# Patient Record
Sex: Female | Born: 1940 | Race: Black or African American | Hispanic: No | Smoking: Former smoker
Health system: Southern US, Community
[De-identification: ages and names within clinical notes are randomized; demographics above are authoritative.]

## PROBLEM LIST (undated history)

## (undated) DIAGNOSIS — I10 Essential (primary) hypertension: Secondary | ICD-10-CM

## (undated) DIAGNOSIS — G4733 Obstructive sleep apnea (adult) (pediatric): Secondary | ICD-10-CM

## (undated) DIAGNOSIS — K602 Anal fissure, unspecified: Secondary | ICD-10-CM

## (undated) DIAGNOSIS — K5909 Other constipation: Secondary | ICD-10-CM

## (undated) DIAGNOSIS — C50919 Malignant neoplasm of unspecified site of unspecified female breast: Secondary | ICD-10-CM

## (undated) DIAGNOSIS — M8580 Other specified disorders of bone density and structure, unspecified site: Secondary | ICD-10-CM

## (undated) DIAGNOSIS — J449 Chronic obstructive pulmonary disease, unspecified: Secondary | ICD-10-CM

## (undated) DIAGNOSIS — G629 Polyneuropathy, unspecified: Secondary | ICD-10-CM

## (undated) DIAGNOSIS — S53104A Unspecified dislocation of right ulnohumeral joint, initial encounter: Secondary | ICD-10-CM

## (undated) DIAGNOSIS — R339 Retention of urine, unspecified: Secondary | ICD-10-CM

## (undated) DIAGNOSIS — I729 Aneurysm of unspecified site: Secondary | ICD-10-CM

## (undated) DIAGNOSIS — K635 Polyp of colon: Secondary | ICD-10-CM

## (undated) DIAGNOSIS — E785 Hyperlipidemia, unspecified: Secondary | ICD-10-CM

## (undated) HISTORY — DX: Essential (primary) hypertension: I10

## (undated) HISTORY — DX: Malignant neoplasm of unspecified site of unspecified female breast: C50.919

## (undated) HISTORY — DX: Other specified disorders of bone density and structure, unspecified site: M85.80

## (undated) HISTORY — PX: EXPLORATORY LAPAROTOMY: SUR591

## (undated) HISTORY — DX: Retention of urine, unspecified: R33.9

## (undated) HISTORY — PX: OTHER SURGICAL HISTORY: SHX169

## (undated) HISTORY — DX: Chronic obstructive pulmonary disease, unspecified: J44.9

## (undated) HISTORY — DX: Anal fissure, unspecified: K60.2

## (undated) HISTORY — DX: Unspecified dislocation of right ulnohumeral joint, initial encounter: S53.104A

## (undated) HISTORY — DX: Polyp of colon: K63.5

## (undated) HISTORY — PX: APPENDECTOMY: SHX54

## (undated) HISTORY — DX: Obstructive sleep apnea (adult) (pediatric): G47.33

## (undated) HISTORY — DX: Aneurysm of unspecified site: I72.9

## (undated) HISTORY — DX: Hyperlipidemia, unspecified: E78.5

## (undated) HISTORY — DX: Other constipation: K59.09

## (undated) HISTORY — DX: Polyneuropathy, unspecified: G62.9

---

## 1998-03-27 ENCOUNTER — Inpatient Hospital Stay (HOSPITAL_COMMUNITY): Admission: EM | Admit: 1998-03-27 | Discharge: 1998-04-07 | Payer: Self-pay | Admitting: *Deleted

## 1998-06-09 ENCOUNTER — Other Ambulatory Visit: Admission: RE | Admit: 1998-06-09 | Discharge: 1998-06-09 | Payer: Self-pay | Admitting: Obstetrics and Gynecology

## 1998-06-19 ENCOUNTER — Emergency Department (HOSPITAL_COMMUNITY): Admission: EM | Admit: 1998-06-19 | Discharge: 1998-06-19 | Payer: Self-pay | Admitting: *Deleted

## 1998-10-31 ENCOUNTER — Ambulatory Visit (HOSPITAL_COMMUNITY): Admission: RE | Admit: 1998-10-31 | Discharge: 1998-10-31 | Payer: Self-pay | Admitting: Gastroenterology

## 1999-06-26 ENCOUNTER — Ambulatory Visit (HOSPITAL_COMMUNITY): Admission: RE | Admit: 1999-06-26 | Discharge: 1999-06-26 | Payer: Self-pay | Admitting: Gastroenterology

## 1999-06-26 ENCOUNTER — Encounter (INDEPENDENT_AMBULATORY_CARE_PROVIDER_SITE_OTHER): Payer: Self-pay | Admitting: Specialist

## 1999-10-24 ENCOUNTER — Other Ambulatory Visit: Admission: RE | Admit: 1999-10-24 | Discharge: 1999-10-24 | Payer: Self-pay | Admitting: Obstetrics and Gynecology

## 2000-11-04 ENCOUNTER — Other Ambulatory Visit: Admission: RE | Admit: 2000-11-04 | Discharge: 2000-11-04 | Payer: Self-pay | Admitting: Obstetrics and Gynecology

## 2000-12-18 ENCOUNTER — Ambulatory Visit: Admission: RE | Admit: 2000-12-18 | Discharge: 2000-12-18 | Payer: Self-pay | Admitting: Gynecology

## 2001-04-25 ENCOUNTER — Ambulatory Visit (HOSPITAL_COMMUNITY): Admission: RE | Admit: 2001-04-25 | Discharge: 2001-04-25 | Payer: Self-pay | Admitting: Gastroenterology

## 2001-04-25 ENCOUNTER — Encounter (INDEPENDENT_AMBULATORY_CARE_PROVIDER_SITE_OTHER): Payer: Self-pay | Admitting: *Deleted

## 2001-06-21 ENCOUNTER — Inpatient Hospital Stay (HOSPITAL_COMMUNITY): Admission: EM | Admit: 2001-06-21 | Discharge: 2001-07-01 | Payer: Self-pay | Admitting: Emergency Medicine

## 2001-06-21 ENCOUNTER — Encounter: Payer: Self-pay | Admitting: Internal Medicine

## 2001-07-08 ENCOUNTER — Encounter: Admission: RE | Admit: 2001-07-08 | Discharge: 2001-10-06 | Payer: Self-pay | Admitting: Pulmonary Disease

## 2002-02-10 ENCOUNTER — Other Ambulatory Visit: Admission: RE | Admit: 2002-02-10 | Discharge: 2002-02-10 | Payer: Self-pay | Admitting: Obstetrics and Gynecology

## 2002-06-22 ENCOUNTER — Ambulatory Visit (HOSPITAL_COMMUNITY): Admission: RE | Admit: 2002-06-22 | Discharge: 2002-06-22 | Payer: Self-pay | Admitting: Gastroenterology

## 2002-09-19 ENCOUNTER — Emergency Department (HOSPITAL_COMMUNITY): Admission: EM | Admit: 2002-09-19 | Discharge: 2002-09-20 | Payer: Self-pay | Admitting: Emergency Medicine

## 2002-09-20 ENCOUNTER — Encounter: Payer: Self-pay | Admitting: Emergency Medicine

## 2003-08-09 ENCOUNTER — Encounter: Payer: Self-pay | Admitting: Internal Medicine

## 2003-08-09 ENCOUNTER — Ambulatory Visit (HOSPITAL_BASED_OUTPATIENT_CLINIC_OR_DEPARTMENT_OTHER): Admission: RE | Admit: 2003-08-09 | Discharge: 2003-08-09 | Payer: Self-pay | Admitting: Pulmonary Disease

## 2003-11-16 ENCOUNTER — Other Ambulatory Visit: Admission: RE | Admit: 2003-11-16 | Discharge: 2003-11-16 | Payer: Self-pay | Admitting: Obstetrics and Gynecology

## 2003-11-19 ENCOUNTER — Ambulatory Visit (HOSPITAL_COMMUNITY): Admission: RE | Admit: 2003-11-19 | Discharge: 2003-11-19 | Payer: Self-pay | Admitting: Obstetrics and Gynecology

## 2004-03-06 ENCOUNTER — Emergency Department (HOSPITAL_COMMUNITY): Admission: EM | Admit: 2004-03-06 | Discharge: 2004-03-07 | Payer: Self-pay | Admitting: Emergency Medicine

## 2004-04-26 ENCOUNTER — Encounter: Admission: RE | Admit: 2004-04-26 | Discharge: 2004-04-26 | Payer: Self-pay | Admitting: General Surgery

## 2004-05-01 ENCOUNTER — Encounter (INDEPENDENT_AMBULATORY_CARE_PROVIDER_SITE_OTHER): Payer: Self-pay | Admitting: *Deleted

## 2004-05-01 ENCOUNTER — Ambulatory Visit (HOSPITAL_COMMUNITY): Admission: RE | Admit: 2004-05-01 | Discharge: 2004-05-01 | Payer: Self-pay | Admitting: General Surgery

## 2004-05-01 ENCOUNTER — Ambulatory Visit (HOSPITAL_BASED_OUTPATIENT_CLINIC_OR_DEPARTMENT_OTHER): Admission: RE | Admit: 2004-05-01 | Discharge: 2004-05-01 | Payer: Self-pay | Admitting: General Surgery

## 2004-08-07 ENCOUNTER — Inpatient Hospital Stay (HOSPITAL_COMMUNITY): Admission: EM | Admit: 2004-08-07 | Discharge: 2004-08-18 | Payer: Self-pay | Admitting: Emergency Medicine

## 2004-08-07 ENCOUNTER — Encounter (INDEPENDENT_AMBULATORY_CARE_PROVIDER_SITE_OTHER): Payer: Self-pay | Admitting: *Deleted

## 2004-12-10 HISTORY — PX: MASTECTOMY: SHX3

## 2004-12-10 LAB — HM COLONOSCOPY: HM Colonoscopy: NORMAL

## 2005-01-01 ENCOUNTER — Encounter: Admission: RE | Admit: 2005-01-01 | Discharge: 2005-01-01 | Payer: Self-pay | Admitting: General Surgery

## 2005-01-10 ENCOUNTER — Encounter: Admission: RE | Admit: 2005-01-10 | Discharge: 2005-01-10 | Payer: Self-pay | Admitting: Surgery

## 2005-01-23 ENCOUNTER — Ambulatory Visit: Payer: Self-pay | Admitting: Internal Medicine

## 2005-01-24 ENCOUNTER — Ambulatory Visit: Payer: Self-pay | Admitting: Internal Medicine

## 2005-01-25 ENCOUNTER — Other Ambulatory Visit: Admission: RE | Admit: 2005-01-25 | Discharge: 2005-01-25 | Payer: Self-pay | Admitting: Obstetrics and Gynecology

## 2005-01-30 ENCOUNTER — Inpatient Hospital Stay (HOSPITAL_COMMUNITY): Admission: RE | Admit: 2005-01-30 | Discharge: 2005-02-02 | Payer: Self-pay | Admitting: General Surgery

## 2005-01-30 ENCOUNTER — Encounter (INDEPENDENT_AMBULATORY_CARE_PROVIDER_SITE_OTHER): Payer: Self-pay | Admitting: *Deleted

## 2005-02-12 ENCOUNTER — Ambulatory Visit: Payer: Self-pay | Admitting: Oncology

## 2005-02-23 ENCOUNTER — Ambulatory Visit: Admission: RE | Admit: 2005-02-23 | Discharge: 2005-04-25 | Payer: Self-pay | Admitting: Radiation Oncology

## 2005-03-30 ENCOUNTER — Ambulatory Visit: Payer: Self-pay | Admitting: Oncology

## 2005-07-27 ENCOUNTER — Ambulatory Visit: Payer: Self-pay | Admitting: Oncology

## 2005-07-27 ENCOUNTER — Ambulatory Visit: Payer: Self-pay | Admitting: Internal Medicine

## 2005-07-30 ENCOUNTER — Encounter: Payer: Self-pay | Admitting: Internal Medicine

## 2005-08-28 ENCOUNTER — Encounter: Payer: Self-pay | Admitting: Internal Medicine

## 2005-08-29 ENCOUNTER — Encounter: Payer: Self-pay | Admitting: Internal Medicine

## 2005-08-29 ENCOUNTER — Encounter: Admission: RE | Admit: 2005-08-29 | Discharge: 2005-08-29 | Payer: Self-pay | Admitting: Gastroenterology

## 2006-01-23 ENCOUNTER — Ambulatory Visit: Payer: Self-pay | Admitting: Oncology

## 2006-01-28 ENCOUNTER — Other Ambulatory Visit: Admission: RE | Admit: 2006-01-28 | Discharge: 2006-01-28 | Payer: Self-pay | Admitting: Obstetrics and Gynecology

## 2006-02-25 ENCOUNTER — Encounter: Payer: Self-pay | Admitting: Internal Medicine

## 2006-03-23 ENCOUNTER — Emergency Department (HOSPITAL_COMMUNITY): Admission: EM | Admit: 2006-03-23 | Discharge: 2006-03-23 | Payer: Self-pay | Admitting: Emergency Medicine

## 2006-08-16 ENCOUNTER — Ambulatory Visit: Payer: Self-pay | Admitting: Oncology

## 2006-08-19 LAB — CBC WITH DIFFERENTIAL/PLATELET
BASO%: 0.4 % (ref 0.0–2.0)
Eosinophils Absolute: 0.4 10*3/uL (ref 0.0–0.5)
MONO#: 0.6 10*3/uL (ref 0.1–0.9)
MONO%: 4.9 % (ref 0.0–13.0)
NEUT#: 9 10*3/uL — ABNORMAL HIGH (ref 1.5–6.5)
RBC: 4.33 10*6/uL (ref 3.70–5.32)
RDW: 13.8 % (ref 11.3–14.5)
WBC: 11.8 10*3/uL — ABNORMAL HIGH (ref 3.9–10.0)

## 2006-08-19 LAB — COMPREHENSIVE METABOLIC PANEL
ALT: 11 U/L (ref 0–40)
AST: 15 U/L (ref 0–37)
CO2: 25 mEq/L (ref 19–32)
Calcium: 9.6 mg/dL (ref 8.4–10.5)
Chloride: 104 mEq/L (ref 96–112)
Potassium: 3.8 mEq/L (ref 3.5–5.3)
Sodium: 139 mEq/L (ref 135–145)
Total Protein: 7.3 g/dL (ref 6.0–8.3)

## 2006-08-19 LAB — CANCER ANTIGEN 27.29: CA 27.29: 25 U/mL (ref 0–39)

## 2006-10-02 ENCOUNTER — Ambulatory Visit: Payer: Self-pay | Admitting: Internal Medicine

## 2006-10-02 LAB — CONVERTED CEMR LAB
AST: 21 units/L (ref 0–37)
BUN: 10 mg/dL (ref 6–23)
CO2: 29 meq/L (ref 19–32)
Glomerular Filtration Rate, Af Am: 108 mL/min/{1.73_m2}
HCT: 42.5 % (ref 36.0–46.0)
MCHC: 32.6 g/dL (ref 30.0–36.0)
Microalb, Ur: 0.8 mg/dL (ref 0.0–1.9)
Platelets: 333 10*3/uL (ref 150–400)
Potassium: 3.8 meq/L (ref 3.5–5.1)
RDW: 13 % (ref 11.5–14.6)
Sed Rate: 20 mm/hr (ref 0–25)
Total CK: 121 units/L (ref 7–177)

## 2006-10-23 ENCOUNTER — Encounter: Payer: Self-pay | Admitting: Internal Medicine

## 2006-10-23 ENCOUNTER — Ambulatory Visit: Payer: Self-pay

## 2006-12-06 ENCOUNTER — Ambulatory Visit: Payer: Self-pay | Admitting: Internal Medicine

## 2007-02-12 ENCOUNTER — Ambulatory Visit: Payer: Self-pay | Admitting: Oncology

## 2007-02-19 LAB — CBC WITH DIFFERENTIAL/PLATELET
Basophils Absolute: 0.1 10*3/uL (ref 0.0–0.1)
EOS%: 5.8 % (ref 0.0–7.0)
LYMPH%: 18.8 % (ref 14.0–48.0)
MCH: 30.5 pg (ref 26.0–34.0)
MCV: 88.1 fL (ref 81.0–101.0)
MONO%: 6.8 % (ref 0.0–13.0)
Platelets: 321 10*3/uL (ref 145–400)
RBC: 4.37 10*6/uL (ref 3.70–5.32)
RDW: 13.5 % (ref 11.3–14.5)

## 2007-02-19 LAB — COMPREHENSIVE METABOLIC PANEL
AST: 16 U/L (ref 0–37)
Albumin: 4.4 g/dL (ref 3.5–5.2)
Alkaline Phosphatase: 66 U/L (ref 39–117)
BUN: 15 mg/dL (ref 6–23)
Glucose, Bld: 96 mg/dL (ref 70–99)
Potassium: 3.9 mEq/L (ref 3.5–5.3)
Sodium: 142 mEq/L (ref 135–145)
Total Bilirubin: 0.5 mg/dL (ref 0.3–1.2)

## 2007-02-21 ENCOUNTER — Ambulatory Visit: Payer: Self-pay | Admitting: Internal Medicine

## 2007-05-16 DIAGNOSIS — E669 Obesity, unspecified: Secondary | ICD-10-CM

## 2007-05-16 DIAGNOSIS — M8580 Other specified disorders of bone density and structure, unspecified site: Secondary | ICD-10-CM

## 2007-05-16 DIAGNOSIS — Z853 Personal history of malignant neoplasm of breast: Secondary | ICD-10-CM

## 2007-05-16 DIAGNOSIS — Z860100 Personal history of colon polyps, unspecified: Secondary | ICD-10-CM | POA: Insufficient documentation

## 2007-05-16 DIAGNOSIS — E1140 Type 2 diabetes mellitus with diabetic neuropathy, unspecified: Secondary | ICD-10-CM

## 2007-05-16 DIAGNOSIS — J302 Other seasonal allergic rhinitis: Secondary | ICD-10-CM

## 2007-05-16 DIAGNOSIS — G4733 Obstructive sleep apnea (adult) (pediatric): Secondary | ICD-10-CM

## 2007-05-16 DIAGNOSIS — J4541 Moderate persistent asthma with (acute) exacerbation: Secondary | ICD-10-CM

## 2007-07-29 ENCOUNTER — Telehealth (INDEPENDENT_AMBULATORY_CARE_PROVIDER_SITE_OTHER): Payer: Self-pay | Admitting: *Deleted

## 2007-08-04 ENCOUNTER — Ambulatory Visit: Payer: Self-pay | Admitting: Internal Medicine

## 2007-08-04 DIAGNOSIS — E119 Type 2 diabetes mellitus without complications: Secondary | ICD-10-CM

## 2007-08-06 LAB — CONVERTED CEMR LAB
BUN: 10 mg/dL (ref 6–23)
Creatinine, Ser: 0.5 mg/dL (ref 0.4–1.2)
GFR calc Af Amer: 160 mL/min
GFR calc non Af Amer: 132 mL/min
Hgb A1c MFr Bld: 7.2 % — ABNORMAL HIGH (ref 4.6–6.0)
Potassium: 3.7 meq/L (ref 3.5–5.1)
Theophylline Lvl: 8.1 ug/mL — ABNORMAL LOW (ref 10.0–20.0)

## 2007-08-15 ENCOUNTER — Ambulatory Visit: Payer: Self-pay | Admitting: Oncology

## 2007-08-21 ENCOUNTER — Telehealth: Payer: Self-pay | Admitting: Internal Medicine

## 2007-08-25 LAB — CBC WITH DIFFERENTIAL/PLATELET
Eosinophils Absolute: 0.5 10*3/uL (ref 0.0–0.5)
LYMPH%: 16.7 % (ref 14.0–48.0)
MONO#: 0.9 10*3/uL (ref 0.1–0.9)
NEUT#: 9.7 10*3/uL — ABNORMAL HIGH (ref 1.5–6.5)
Platelets: 299 10*3/uL (ref 145–400)
RBC: 4.37 10*6/uL (ref 3.70–5.32)
WBC: 13.5 10*3/uL — ABNORMAL HIGH (ref 3.9–10.0)

## 2007-08-26 LAB — COMPREHENSIVE METABOLIC PANEL
Albumin: 4.6 g/dL (ref 3.5–5.2)
CO2: 21 mEq/L (ref 19–32)
Calcium: 10.2 mg/dL (ref 8.4–10.5)
Chloride: 103 mEq/L (ref 96–112)
Glucose, Bld: 113 mg/dL — ABNORMAL HIGH (ref 70–99)
Potassium: 4.1 mEq/L (ref 3.5–5.3)
Sodium: 141 mEq/L (ref 135–145)
Total Bilirubin: 0.4 mg/dL (ref 0.3–1.2)
Total Protein: 7.6 g/dL (ref 6.0–8.3)

## 2007-08-26 LAB — CANCER ANTIGEN 27.29: CA 27.29: 36 U/mL (ref 0–39)

## 2007-08-26 LAB — LACTATE DEHYDROGENASE: LDH: 167 U/L (ref 94–250)

## 2007-09-01 ENCOUNTER — Ambulatory Visit: Payer: Self-pay | Admitting: Internal Medicine

## 2007-10-11 LAB — CONVERTED CEMR LAB: Pap Smear: NORMAL

## 2007-11-04 ENCOUNTER — Ambulatory Visit: Payer: Self-pay | Admitting: Internal Medicine

## 2007-11-04 DIAGNOSIS — E785 Hyperlipidemia, unspecified: Secondary | ICD-10-CM | POA: Insufficient documentation

## 2007-11-05 LAB — CONVERTED CEMR LAB
ALT: 23 units/L (ref 0–35)
AST: 24 units/L (ref 0–37)
BUN: 10 mg/dL (ref 6–23)
Basophils Absolute: 0.1 10*3/uL (ref 0.0–0.1)
Basophils Relative: 0.8 % (ref 0.0–1.0)
CO2: 26 meq/L (ref 19–32)
Calcium: 10.2 mg/dL (ref 8.4–10.5)
Creatinine, Ser: 0.6 mg/dL (ref 0.4–1.2)
Hgb A1c MFr Bld: 7.4 % — ABNORMAL HIGH (ref 4.6–6.0)
Monocytes Relative: 7 % (ref 3.0–11.0)
Platelets: 327 10*3/uL (ref 150–400)
RBC: 4.43 M/uL (ref 3.87–5.11)
RDW: 13.1 % (ref 11.5–14.6)
TSH: 0.73 microintl units/mL (ref 0.35–5.50)

## 2007-12-22 ENCOUNTER — Ambulatory Visit: Payer: Self-pay | Admitting: Internal Medicine

## 2007-12-23 ENCOUNTER — Ambulatory Visit: Payer: Self-pay | Admitting: Internal Medicine

## 2007-12-24 ENCOUNTER — Telehealth (INDEPENDENT_AMBULATORY_CARE_PROVIDER_SITE_OTHER): Payer: Self-pay | Admitting: *Deleted

## 2008-01-06 ENCOUNTER — Ambulatory Visit: Payer: Self-pay | Admitting: Internal Medicine

## 2008-01-06 ENCOUNTER — Telehealth: Payer: Self-pay | Admitting: Internal Medicine

## 2008-01-27 ENCOUNTER — Ambulatory Visit: Payer: Self-pay | Admitting: Internal Medicine

## 2008-02-18 ENCOUNTER — Ambulatory Visit: Payer: Self-pay | Admitting: Oncology

## 2008-02-20 LAB — CBC WITH DIFFERENTIAL/PLATELET
Basophils Absolute: 0.1 10*3/uL (ref 0.0–0.1)
EOS%: 5.2 % (ref 0.0–7.0)
HCT: 38.7 % (ref 34.8–46.6)
HGB: 13.1 g/dL (ref 11.6–15.9)
MCH: 29.2 pg (ref 26.0–34.0)
MONO#: 0.6 10*3/uL (ref 0.1–0.9)
NEUT%: 66.5 % (ref 39.6–76.8)
lymph#: 2.7 10*3/uL (ref 0.9–3.3)

## 2008-02-20 LAB — COMPREHENSIVE METABOLIC PANEL
BUN: 13 mg/dL (ref 6–23)
CO2: 23 mEq/L (ref 19–32)
Calcium: 9 mg/dL (ref 8.4–10.5)
Chloride: 104 mEq/L (ref 96–112)
Creatinine, Ser: 0.65 mg/dL (ref 0.40–1.20)
Glucose, Bld: 132 mg/dL — ABNORMAL HIGH (ref 70–99)

## 2008-02-20 LAB — LACTATE DEHYDROGENASE: LDH: 156 U/L (ref 94–250)

## 2008-02-27 ENCOUNTER — Encounter: Payer: Self-pay | Admitting: Internal Medicine

## 2008-03-23 ENCOUNTER — Ambulatory Visit: Payer: Self-pay | Admitting: Internal Medicine

## 2008-08-26 ENCOUNTER — Telehealth (INDEPENDENT_AMBULATORY_CARE_PROVIDER_SITE_OTHER): Payer: Self-pay | Admitting: *Deleted

## 2008-10-08 ENCOUNTER — Ambulatory Visit: Payer: Self-pay | Admitting: Internal Medicine

## 2008-10-12 ENCOUNTER — Encounter (INDEPENDENT_AMBULATORY_CARE_PROVIDER_SITE_OTHER): Payer: Self-pay | Admitting: *Deleted

## 2008-10-12 LAB — CONVERTED CEMR LAB
ALT: 12 units/L (ref 0–35)
AST: 18 units/L (ref 0–37)
BUN: 9 mg/dL (ref 6–23)
Basophils Relative: 0.3 % (ref 0.0–3.0)
CO2: 27 meq/L (ref 19–32)
Chloride: 106 meq/L (ref 96–112)
Creatinine, Ser: 0.6 mg/dL (ref 0.4–1.2)
Eosinophils Absolute: 0.6 10*3/uL (ref 0.0–0.7)
Eosinophils Relative: 5.4 % — ABNORMAL HIGH (ref 0.0–5.0)
GFR calc non Af Amer: 106 mL/min
Glucose, Bld: 141 mg/dL — ABNORMAL HIGH (ref 70–99)
Hgb A1c MFr Bld: 7.2 % — ABNORMAL HIGH (ref 4.6–6.0)
MCV: 89.3 fL (ref 78.0–100.0)
Microalb Creat Ratio: 33.1 mg/g — ABNORMAL HIGH (ref 0.0–30.0)
Monocytes Relative: 3.9 % (ref 3.0–12.0)
Neutrophils Relative %: 74.5 % (ref 43.0–77.0)
Platelets: 304 10*3/uL (ref 150–400)
Potassium: 4 meq/L (ref 3.5–5.1)
RBC: 4.51 M/uL (ref 3.87–5.11)
TSH: 0.71 microintl units/mL (ref 0.35–5.50)
Total CHOL/HDL Ratio: 3.4
VLDL: 19 mg/dL (ref 0–40)
WBC: 11.1 10*3/uL — ABNORMAL HIGH (ref 4.5–10.5)

## 2008-10-25 ENCOUNTER — Telehealth (INDEPENDENT_AMBULATORY_CARE_PROVIDER_SITE_OTHER): Payer: Self-pay | Admitting: *Deleted

## 2008-11-17 ENCOUNTER — Ambulatory Visit: Payer: Self-pay | Admitting: Internal Medicine

## 2008-11-22 ENCOUNTER — Ambulatory Visit: Payer: Self-pay | Admitting: Internal Medicine

## 2008-11-24 ENCOUNTER — Telehealth (INDEPENDENT_AMBULATORY_CARE_PROVIDER_SITE_OTHER): Payer: Self-pay | Admitting: *Deleted

## 2009-02-14 ENCOUNTER — Ambulatory Visit: Payer: Self-pay | Admitting: Internal Medicine

## 2009-02-16 LAB — CONVERTED CEMR LAB: Hgb A1c MFr Bld: 7 % — ABNORMAL HIGH (ref 4.6–6.0)

## 2009-03-07 ENCOUNTER — Telehealth (INDEPENDENT_AMBULATORY_CARE_PROVIDER_SITE_OTHER): Payer: Self-pay | Admitting: *Deleted

## 2009-03-08 ENCOUNTER — Telehealth (INDEPENDENT_AMBULATORY_CARE_PROVIDER_SITE_OTHER): Payer: Self-pay | Admitting: *Deleted

## 2009-03-28 ENCOUNTER — Telehealth (INDEPENDENT_AMBULATORY_CARE_PROVIDER_SITE_OTHER): Payer: Self-pay | Admitting: *Deleted

## 2009-04-19 ENCOUNTER — Encounter: Payer: Self-pay | Admitting: Internal Medicine

## 2009-04-20 ENCOUNTER — Encounter: Payer: Self-pay | Admitting: Internal Medicine

## 2009-05-18 ENCOUNTER — Encounter: Payer: Self-pay | Admitting: Internal Medicine

## 2009-06-09 ENCOUNTER — Ambulatory Visit: Payer: Self-pay | Admitting: Internal Medicine

## 2009-06-09 DIAGNOSIS — R1032 Left lower quadrant pain: Secondary | ICD-10-CM

## 2009-06-09 DIAGNOSIS — M79609 Pain in unspecified limb: Secondary | ICD-10-CM | POA: Insufficient documentation

## 2009-06-10 ENCOUNTER — Encounter: Admission: RE | Admit: 2009-06-10 | Discharge: 2009-06-10 | Payer: Self-pay | Admitting: Internal Medicine

## 2009-06-14 LAB — CONVERTED CEMR LAB
AST: 24 units/L (ref 0–37)
BUN: 11 mg/dL (ref 6–23)
Basophils Absolute: 0.1 10*3/uL (ref 0.0–0.1)
CO2: 28 meq/L (ref 19–32)
Calcium: 9.8 mg/dL (ref 8.4–10.5)
Eosinophils Absolute: 1 10*3/uL — ABNORMAL HIGH (ref 0.0–0.7)
GFR calc non Af Amer: 128.33 mL/min (ref >60)
Glucose, Bld: 67 mg/dL — ABNORMAL LOW (ref 70–99)
Hemoglobin: 13.8 g/dL (ref 12.0–15.0)
Lymphocytes Relative: 22.7 % (ref 12.0–46.0)
Lymphs Abs: 2.5 10*3/uL (ref 0.7–4.0)
MCHC: 33.4 g/dL (ref 30.0–36.0)
Monocytes Relative: 2 % — ABNORMAL LOW (ref 3.0–12.0)
Neutro Abs: 7.4 10*3/uL (ref 1.4–7.7)
Platelets: 312 10*3/uL (ref 150.0–400.0)
RDW: 13.2 % (ref 11.5–14.6)
Sodium: 143 meq/L (ref 135–145)

## 2009-07-05 ENCOUNTER — Encounter: Payer: Self-pay | Admitting: Internal Medicine

## 2009-07-12 ENCOUNTER — Ambulatory Visit: Payer: Self-pay | Admitting: Internal Medicine

## 2009-07-13 ENCOUNTER — Encounter (INDEPENDENT_AMBULATORY_CARE_PROVIDER_SITE_OTHER): Payer: Self-pay | Admitting: *Deleted

## 2009-08-09 ENCOUNTER — Ambulatory Visit: Payer: Self-pay | Admitting: Internal Medicine

## 2009-08-18 ENCOUNTER — Telehealth (INDEPENDENT_AMBULATORY_CARE_PROVIDER_SITE_OTHER): Payer: Self-pay | Admitting: *Deleted

## 2009-09-01 ENCOUNTER — Encounter: Payer: Self-pay | Admitting: Internal Medicine

## 2009-09-08 ENCOUNTER — Ambulatory Visit: Payer: Self-pay | Admitting: Internal Medicine

## 2009-09-19 ENCOUNTER — Encounter: Payer: Self-pay | Admitting: Internal Medicine

## 2010-01-13 ENCOUNTER — Telehealth (INDEPENDENT_AMBULATORY_CARE_PROVIDER_SITE_OTHER): Payer: Self-pay | Admitting: *Deleted

## 2010-01-20 ENCOUNTER — Ambulatory Visit: Payer: Self-pay | Admitting: Internal Medicine

## 2010-01-23 LAB — CONVERTED CEMR LAB
ALT: 17 U/L
AST: 17 U/L
BUN: 7 mg/dL
Basophils Absolute: 0.1 K/uL
Basophils Relative: 0.6 %
CO2: 28 meq/L
Calcium: 10.3 mg/dL
Chloride: 102 meq/L
Cholesterol: 164 mg/dL
Creatinine, Ser: 0.6 mg/dL
Creatinine,U: 41.3 mg/dL
Eosinophils Absolute: 0.4 K/uL
Eosinophils Relative: 4.3 %
GFR calc non Af Amer: 128.09 mL/min
Glucose, Bld: 93 mg/dL
HCT: 41.7 %
HDL: 62.3 mg/dL
Hemoglobin: 13.6 g/dL
Hgb A1c MFr Bld: 6.9 % — ABNORMAL HIGH
LDL Cholesterol: 86 mg/dL
Lymphocytes Relative: 25.5 %
Lymphs Abs: 2.4 K/uL
MCHC: 32.6 g/dL
MCV: 94.1 fL
Microalb Creat Ratio: 16.9 mg/g
Microalb, Ur: 0.7 mg/dL
Monocytes Absolute: 0.5 K/uL
Monocytes Relative: 5.2 %
Neutro Abs: 6 K/uL
Neutrophils Relative %: 64.4 %
Platelets: 292 K/uL
Potassium: 3.6 meq/L
RBC: 4.43 M/uL
RDW: 12.6 %
Sodium: 139 meq/L
TSH: 0.84 u[IU]/mL
Total CHOL/HDL Ratio: 3
Triglycerides: 79 mg/dL
VLDL: 15.8 mg/dL
WBC: 9.4 10*3/microliter

## 2010-01-25 ENCOUNTER — Telehealth: Payer: Self-pay | Admitting: Internal Medicine

## 2010-02-08 ENCOUNTER — Ambulatory Visit: Payer: Self-pay | Admitting: Internal Medicine

## 2010-02-09 ENCOUNTER — Ambulatory Visit: Payer: Self-pay | Admitting: Internal Medicine

## 2010-02-20 ENCOUNTER — Telehealth: Payer: Self-pay | Admitting: Internal Medicine

## 2010-03-03 ENCOUNTER — Encounter: Payer: Self-pay | Admitting: Internal Medicine

## 2010-03-15 ENCOUNTER — Telehealth: Payer: Self-pay | Admitting: Internal Medicine

## 2010-03-17 ENCOUNTER — Encounter: Admission: RE | Admit: 2010-03-17 | Discharge: 2010-03-17 | Payer: Self-pay | Admitting: Gastroenterology

## 2010-03-20 ENCOUNTER — Encounter: Payer: Self-pay | Admitting: Internal Medicine

## 2010-03-24 ENCOUNTER — Encounter: Payer: Self-pay | Admitting: Internal Medicine

## 2010-03-28 ENCOUNTER — Encounter: Payer: Self-pay | Admitting: Internal Medicine

## 2010-03-29 ENCOUNTER — Encounter: Payer: Self-pay | Admitting: Internal Medicine

## 2010-03-30 ENCOUNTER — Encounter: Payer: Self-pay | Admitting: Internal Medicine

## 2010-03-30 ENCOUNTER — Ambulatory Visit: Payer: Self-pay | Admitting: Oncology

## 2010-03-31 LAB — CBC WITH DIFFERENTIAL/PLATELET
BASO%: 0.3 % (ref 0.0–2.0)
Eosinophils Absolute: 0.4 10*3/uL (ref 0.0–0.5)
HCT: 42.6 % (ref 34.8–46.6)
LYMPH%: 20.9 % (ref 14.0–49.7)
MCHC: 33.4 g/dL (ref 31.5–36.0)
MCV: 92.7 fL (ref 79.5–101.0)
MONO#: 0.5 10*3/uL (ref 0.1–0.9)
MONO%: 5.3 % (ref 0.0–14.0)
NEUT%: 69.9 % (ref 38.4–76.8)
Platelets: 312 10*3/uL (ref 145–400)
WBC: 10.1 10*3/uL (ref 3.9–10.3)

## 2010-03-31 LAB — COMPREHENSIVE METABOLIC PANEL
Alkaline Phosphatase: 54 U/L (ref 39–117)
CO2: 27 mEq/L (ref 19–32)
Creatinine, Ser: 0.72 mg/dL (ref 0.40–1.20)
Glucose, Bld: 170 mg/dL — ABNORMAL HIGH (ref 70–99)
Total Bilirubin: 0.5 mg/dL (ref 0.3–1.2)

## 2010-03-31 LAB — LACTATE DEHYDROGENASE: LDH: 149 U/L (ref 94–250)

## 2010-03-31 LAB — CANCER ANTIGEN 27.29: CA 27.29: 28 U/mL (ref 0–39)

## 2010-04-04 ENCOUNTER — Encounter: Payer: Self-pay | Admitting: Internal Medicine

## 2010-04-07 ENCOUNTER — Ambulatory Visit: Payer: Self-pay | Admitting: Family Medicine

## 2010-04-07 DIAGNOSIS — M25519 Pain in unspecified shoulder: Secondary | ICD-10-CM

## 2010-04-10 ENCOUNTER — Ambulatory Visit: Payer: Self-pay | Admitting: Internal Medicine

## 2010-04-11 ENCOUNTER — Ambulatory Visit (HOSPITAL_COMMUNITY): Admission: RE | Admit: 2010-04-11 | Discharge: 2010-04-11 | Payer: Self-pay | Admitting: General Surgery

## 2010-04-12 ENCOUNTER — Telehealth (INDEPENDENT_AMBULATORY_CARE_PROVIDER_SITE_OTHER): Payer: Self-pay | Admitting: *Deleted

## 2010-04-12 ENCOUNTER — Ambulatory Visit (HOSPITAL_COMMUNITY): Admission: RE | Admit: 2010-04-12 | Discharge: 2010-04-12 | Payer: Self-pay | Admitting: Oncology

## 2010-04-14 ENCOUNTER — Telehealth (INDEPENDENT_AMBULATORY_CARE_PROVIDER_SITE_OTHER): Payer: Self-pay | Admitting: *Deleted

## 2010-04-14 ENCOUNTER — Ambulatory Visit: Admission: RE | Admit: 2010-04-14 | Discharge: 2010-06-08 | Payer: Self-pay | Admitting: Radiation Oncology

## 2010-04-17 ENCOUNTER — Encounter: Payer: Self-pay | Admitting: Internal Medicine

## 2010-04-18 ENCOUNTER — Ambulatory Visit (HOSPITAL_BASED_OUTPATIENT_CLINIC_OR_DEPARTMENT_OTHER): Admission: RE | Admit: 2010-04-18 | Discharge: 2010-04-18 | Payer: Self-pay | Admitting: General Surgery

## 2010-04-27 ENCOUNTER — Encounter: Payer: Self-pay | Admitting: Internal Medicine

## 2010-05-23 ENCOUNTER — Ambulatory Visit: Payer: Self-pay | Admitting: Oncology

## 2010-05-24 ENCOUNTER — Encounter: Payer: Self-pay | Admitting: Internal Medicine

## 2010-05-30 ENCOUNTER — Encounter: Payer: Self-pay | Admitting: Internal Medicine

## 2010-06-01 ENCOUNTER — Encounter: Payer: Self-pay | Admitting: Internal Medicine

## 2010-06-05 ENCOUNTER — Telehealth: Payer: Self-pay | Admitting: Internal Medicine

## 2010-06-05 ENCOUNTER — Encounter: Payer: Self-pay | Admitting: Internal Medicine

## 2010-06-09 ENCOUNTER — Ambulatory Visit: Admission: RE | Admit: 2010-06-09 | Discharge: 2010-07-14 | Payer: Self-pay | Admitting: Radiation Oncology

## 2010-06-15 ENCOUNTER — Ambulatory Visit: Payer: Self-pay | Admitting: Internal Medicine

## 2010-07-06 ENCOUNTER — Encounter: Payer: Self-pay | Admitting: Internal Medicine

## 2010-07-11 ENCOUNTER — Encounter: Payer: Self-pay | Admitting: Internal Medicine

## 2010-07-16 ENCOUNTER — Encounter: Payer: Self-pay | Admitting: Internal Medicine

## 2010-07-26 ENCOUNTER — Encounter: Payer: Self-pay | Admitting: Internal Medicine

## 2010-08-01 ENCOUNTER — Telehealth (INDEPENDENT_AMBULATORY_CARE_PROVIDER_SITE_OTHER): Payer: Self-pay | Admitting: *Deleted

## 2010-08-04 ENCOUNTER — Ambulatory Visit: Payer: Self-pay | Admitting: Internal Medicine

## 2010-08-07 LAB — CONVERTED CEMR LAB: Hgb A1c MFr Bld: 6.9 % — ABNORMAL HIGH (ref 4.6–6.5)

## 2010-08-16 ENCOUNTER — Ambulatory Visit: Payer: Self-pay | Admitting: Oncology

## 2010-08-16 LAB — CBC WITH DIFFERENTIAL/PLATELET
BASO%: 0.4 % (ref 0.0–2.0)
EOS%: 4.1 % (ref 0.0–7.0)
Eosinophils Absolute: 0.3 10*3/uL (ref 0.0–0.5)
LYMPH%: 8.9 % — ABNORMAL LOW (ref 14.0–49.7)
MCH: 30.3 pg (ref 25.1–34.0)
MCHC: 33.1 g/dL (ref 31.5–36.0)
MCV: 91.5 fL (ref 79.5–101.0)
MONO%: 6.4 % (ref 0.0–14.0)
Platelets: 274 10*3/uL (ref 145–400)
RBC: 4.3 10*6/uL (ref 3.70–5.45)
RDW: 14.1 % (ref 11.2–14.5)

## 2010-08-16 LAB — COMPREHENSIVE METABOLIC PANEL
AST: 21 U/L (ref 0–37)
Alkaline Phosphatase: 58 U/L (ref 39–117)
Glucose, Bld: 119 mg/dL — ABNORMAL HIGH (ref 70–99)
Potassium: 4 mEq/L (ref 3.5–5.3)
Sodium: 141 mEq/L (ref 135–145)
Total Bilirubin: 0.5 mg/dL (ref 0.3–1.2)
Total Protein: 7.1 g/dL (ref 6.0–8.3)

## 2010-08-17 ENCOUNTER — Ambulatory Visit: Payer: Self-pay | Admitting: Internal Medicine

## 2010-08-24 ENCOUNTER — Encounter: Payer: Self-pay | Admitting: Internal Medicine

## 2010-08-24 ENCOUNTER — Telehealth: Payer: Self-pay | Admitting: Internal Medicine

## 2010-10-17 ENCOUNTER — Ambulatory Visit: Payer: Self-pay | Admitting: Internal Medicine

## 2010-10-17 DIAGNOSIS — B009 Herpesviral infection, unspecified: Secondary | ICD-10-CM | POA: Insufficient documentation

## 2010-10-25 ENCOUNTER — Ambulatory Visit: Payer: Self-pay | Admitting: Internal Medicine

## 2010-11-21 ENCOUNTER — Ambulatory Visit: Payer: Self-pay | Admitting: Oncology

## 2010-11-22 ENCOUNTER — Ambulatory Visit: Payer: Self-pay | Admitting: Internal Medicine

## 2010-11-23 LAB — CBC WITH DIFFERENTIAL/PLATELET
Eosinophils Absolute: 0.2 10*3/uL (ref 0.0–0.5)
MONO#: 0.5 10*3/uL (ref 0.1–0.9)
NEUT#: 6.8 10*3/uL — ABNORMAL HIGH (ref 1.5–6.5)
RBC: 4.46 10*6/uL (ref 3.70–5.45)
RDW: 14 % (ref 11.2–14.5)
WBC: 8.4 10*3/uL (ref 3.9–10.3)
lymph#: 0.8 10*3/uL — ABNORMAL LOW (ref 0.9–3.3)

## 2010-11-23 LAB — COMPREHENSIVE METABOLIC PANEL
Albumin: 4.1 g/dL (ref 3.5–5.2)
Alkaline Phosphatase: 57 U/L (ref 39–117)
CO2: 28 mEq/L (ref 19–32)
Chloride: 103 mEq/L (ref 96–112)
Glucose, Bld: 194 mg/dL — ABNORMAL HIGH (ref 70–99)
Potassium: 3.6 mEq/L (ref 3.5–5.3)
Sodium: 142 mEq/L (ref 135–145)
Total Protein: 7.3 g/dL (ref 6.0–8.3)

## 2010-11-24 LAB — VITAMIN D 25 HYDROXY (VIT D DEFICIENCY, FRACTURES): Vit D, 25-Hydroxy: 26 ng/mL — ABNORMAL LOW (ref 30–89)

## 2010-11-27 ENCOUNTER — Ambulatory Visit: Payer: Self-pay | Admitting: Family Medicine

## 2010-11-27 DIAGNOSIS — T7840XA Allergy, unspecified, initial encounter: Secondary | ICD-10-CM | POA: Insufficient documentation

## 2010-12-22 ENCOUNTER — Ambulatory Visit: Admit: 2010-12-22 | Payer: Self-pay | Admitting: Internal Medicine

## 2010-12-26 ENCOUNTER — Inpatient Hospital Stay (HOSPITAL_COMMUNITY)
Admission: EM | Admit: 2010-12-26 | Discharge: 2011-01-02 | Payer: Self-pay | Source: Home / Self Care | Attending: Internal Medicine | Admitting: Internal Medicine

## 2010-12-27 ENCOUNTER — Telehealth: Payer: Self-pay | Admitting: Internal Medicine

## 2010-12-27 ENCOUNTER — Ambulatory Visit: Admit: 2010-12-27 | Payer: Self-pay | Admitting: Internal Medicine

## 2010-12-27 LAB — OCCULT BLOOD, POC DEVICE: Fecal Occult Bld: POSITIVE

## 2010-12-27 LAB — COMPREHENSIVE METABOLIC PANEL
ALT: 17 U/L (ref 0–35)
AST: 26 U/L (ref 0–37)
Albumin: 3.5 g/dL (ref 3.5–5.2)
Alkaline Phosphatase: 50 U/L (ref 39–117)
BUN: 10 mg/dL (ref 6–23)
CO2: 26 mEq/L (ref 19–32)
Calcium: 9.3 mg/dL (ref 8.4–10.5)
Chloride: 101 mEq/L (ref 96–112)
Creatinine, Ser: 0.69 mg/dL (ref 0.4–1.2)
GFR calc Af Amer: >60 mL/min (ref >60)
GFR calc non Af Amer: >60 mL/min (ref >60)
Glucose, Bld: 114 mg/dL — ABNORMAL HIGH (ref 70–99)
Potassium: 3.8 mEq/L (ref 3.5–5.1)
Sodium: 136 mEq/L (ref 135–145)
Total Bilirubin: 0.8 mg/dL (ref 0.3–1.2)
Total Protein: 6.9 g/dL (ref 6.0–8.3)

## 2010-12-27 LAB — CBC
HCT: 35.2 % — ABNORMAL LOW (ref 36.0–46.0)
Hemoglobin: 11.6 g/dL — ABNORMAL LOW (ref 12.0–15.0)
MCH: 29 pg (ref 26.0–34.0)
MCHC: 33 g/dL (ref 30.0–36.0)
MCV: 88 fL (ref 78.0–100.0)
Platelets: 347 10*3/uL (ref 150–400)
RBC: 4 MIL/uL (ref 3.87–5.11)
RDW: 13.2 % (ref 11.5–15.5)
WBC: 10 10*3/uL (ref 4.0–10.5)

## 2010-12-27 LAB — DIFFERENTIAL
Basophils Absolute: 0.1 10*3/uL (ref 0.0–0.1)
Basophils Relative: 1 % (ref 0–1)
Eosinophils Absolute: 0.2 10*3/uL (ref 0.0–0.7)
Eosinophils Relative: 2 % (ref 0–5)
Lymphocytes Relative: 13 % (ref 12–46)
Lymphs Abs: 1.3 10*3/uL (ref 0.7–4.0)
Monocytes Absolute: 0.9 10*3/uL (ref 0.1–1.0)
Monocytes Relative: 9 % (ref 3–12)
Neutro Abs: 7.5 10*3/uL (ref 1.7–7.7)
Neutrophils Relative %: 75 % (ref 43–77)

## 2010-12-27 LAB — GLUCOSE, CAPILLARY: Glucose-Capillary: 118 mg/dL — ABNORMAL HIGH (ref 70–99)

## 2010-12-29 NOTE — Consult Note (Signed)
NAMEHARLY, [name] NO.:  [id number]  MEDICAL RECORD NO.:  [id number]           PATIENT TYPE:  LOCATION:                                 FACILITY:  PHYSICIAN:  Shirley Friar, MDDATE OF BIRTH:  10-11-1941  DATE OF CONSULTATION: DATE OF DISCHARGE:                                CONSULTATION   REQUESTING PHYSICIAN:  Richarda Overlie, MD  INDICATIONS:  Heme-positive stool, diarrhea.  HISTORY OF PRESENT ILLNESS:  [name] is a pleasant 70 year old black female and a patient of Dr. Dorena Cookey, whose late husband was a patient of mine, and she has a history of chronic constipation which usually is helped by milk of magnesia until recently.  For the past 2 weeks, her constipation has been worse and not responding to milk of magnesia and during this time, she began having watery stools several times per day.  She also started having rectal pain last week and took ibuprofen x4 days with that pain.  Within the last week, she also saw red blood with defecation and denies any bright red blood per rectum or black stools.  She was heme positive on presentation with a hemoglobin of 11.6.  She has a history of tortuous colon with incomplete colonoscopies in the past and had a virtual colonoscopy done in April 2011 which did not show any polyps but did show thickening of the rectosigmoid colonic mucosa.  PAST MEDICAL HISTORY: 1. History of breast cancer with recent recurrence. 2. Type 2 diabetes mellitus. 3. Hyperlipidemia. 4. History of asthma. 5. Osteopenia. 6. History of allergic rhinitis. 7. Obstructive sleep apnea. 8. Morbid obesity. 9. Peripheral neuropathy.  PAST SURGICAL HISTORY: 1. Status post appendectomy. 2. Status post bilateral mastectomy. 3. Status post surgery for pilonidal cyst.  MEDICINES ON ADMISSION:  Glucophage, Singulair, simvastatin, Provera, Prilosec, Mucinex, cetirizine, Topicort, multivitamins in the form of Centrum Silver,  Citracal.  ALLERGIES:  PENICILLIN G.  FAMILY HISTORY:  Noncontributory.  SOCIAL HISTORY:  Widow, two children, daughter had newly delivered twin girls, denies alcohol.  PHYSICAL EXAMINATION:  VITAL SIGNS:  Temperature 97.9, pulse 68, blood pressure 130/72. GENERAL:  Alert, in no acute distress, well nourished. ABDOMEN:  Minimal tenderness diffusely without guarding, soft, nondistended, positive bowel sounds.  LABORATORY DATA:  White blood count 8.0; hemoglobin 10.6, down from 11.6; platelet count 299.  INR 1.03.  Other labs reviewed and listed in hospital record.  IMPRESSION:  This is a 70 year old black female with chronic constipation who has been having diarrhea for the last couple of weeks and then started having rectal pain and an episode of small amount of red blood in her stool.  She is likely having overflow incontinence of her stool and irritation of her rectum due to chronic constipation.  She may also have a stercoral ulcer as well from the chronic constipation. I would not recommend a sigmoidoscopy at this time and an upper endoscopy is not needed.  We recommend MiraLax twice a day to get her bowels moving and supportive care.  If no response to MiraLax, then the patient may need to have magnesium citrate.  We will do liquid diet and advance as tolerated.  Okay to discharge on December 28, 2010, if she is doing okay and follow up with Dr. Madilyn Fireman in 3-4 weeks.     Shirley Friar, MD     VCS/MEDQ  D:  12/27/2010  T:  12/28/2010  Job:  [id number]  cc:   Everardo All. Madilyn Fireman, M.D. [name], MD  Electronically Signed by Charlott Rakes MD on 12/29/2010 10:54:09 AM

## 2010-12-31 ENCOUNTER — Encounter: Payer: Self-pay | Admitting: Oncology

## 2011-01-01 LAB — GLUCOSE, CAPILLARY
Glucose-Capillary: 92 mg/dL (ref 70–99)
Glucose-Capillary: 96 mg/dL (ref 70–99)
Glucose-Capillary: 89 mg/dL (ref 70–99)
Glucose-Capillary: 88 mg/dL (ref 70–99)
Glucose-Capillary: 110 mg/dL — ABNORMAL HIGH (ref 70–99)
Glucose-Capillary: 111 mg/dL — ABNORMAL HIGH (ref 70–99)
Glucose-Capillary: 95 mg/dL (ref 70–99)
Glucose-Capillary: 114 mg/dL — ABNORMAL HIGH (ref 70–99)

## 2011-01-01 LAB — CK TOTAL AND CKMB (NOT AT ARMC)
CK, MB: 4.2 ng/mL — ABNORMAL HIGH (ref 0.3–4.0)
Total CK: 335 U/L — ABNORMAL HIGH (ref 7–177)

## 2011-01-01 LAB — CBC
HCT: 34.1 % — ABNORMAL LOW (ref 36.0–46.0)
Hemoglobin: 10.9 g/dL — ABNORMAL LOW (ref 12.0–15.0)
Hemoglobin: 11.2 g/dL — ABNORMAL LOW (ref 12.0–15.0)
MCH: 28.7 pg (ref 26.0–34.0)
MCHC: 32.8 g/dL (ref 30.0–36.0)
MCV: 90.2 fL (ref 78.0–100.0)
MCV: 90.7 fL (ref 78.0–100.0)
MCV: 90 fL (ref 78.0–100.0)
Platelets: 299 10*3/uL (ref 150–400)
RBC: 3.77 MIL/uL — ABNORMAL LOW (ref 3.87–5.11)
RDW: 13.5 % (ref 11.5–15.5)
RDW: 13.3 % (ref 11.5–15.5)

## 2011-01-01 LAB — DIFFERENTIAL
Basophils Absolute: 0 10*3/uL (ref 0.0–0.1)
Lymphocytes Relative: 7 % — ABNORMAL LOW (ref 12–46)
Lymphs Abs: 0.9 10*3/uL (ref 0.7–4.0)
Monocytes Absolute: 0.8 10*3/uL (ref 0.1–1.0)
Neutro Abs: 12.4 10*3/uL — ABNORMAL HIGH (ref 1.7–7.7)

## 2011-01-01 LAB — CARDIAC PANEL(CRET KIN+CKTOT+MB+TROPI)
CK, MB: 2.1 ng/mL (ref 0.3–4.0)
Relative Index: 0.7 (ref 0.0–2.5)

## 2011-01-01 LAB — BASIC METABOLIC PANEL
BUN: 7 mg/dL (ref 6–23)
Chloride: 103 mEq/L (ref 96–112)
Glucose, Bld: 100 mg/dL — ABNORMAL HIGH (ref 70–99)
Sodium: 142 mEq/L (ref 135–145)

## 2011-01-01 LAB — COMPREHENSIVE METABOLIC PANEL
AST: 23 U/L (ref 0–37)
Alkaline Phosphatase: 42 U/L (ref 39–117)
Chloride: 106 mEq/L (ref 96–112)
Glucose, Bld: 86 mg/dL (ref 70–99)
Sodium: 138 mEq/L (ref 135–145)
Total Bilirubin: 1 mg/dL (ref 0.3–1.2)
Total Protein: 5.8 g/dL — ABNORMAL LOW (ref 6.0–8.3)

## 2011-01-01 LAB — TSH: TSH: 1.085 u[IU]/mL (ref 0.350–4.500)

## 2011-01-01 LAB — PROTIME-INR: Prothrombin Time: 13.7 seconds (ref 11.6–15.2)

## 2011-01-02 LAB — CBC
Hemoglobin: 11.1 g/dL — ABNORMAL LOW (ref 12.0–15.0)
Hemoglobin: 10.3 g/dL — ABNORMAL LOW (ref 12.0–15.0)
MCH: 29.1 pg (ref 26.0–34.0)
MCV: 89 fL (ref 78.0–100.0)
Platelets: 415 10*3/uL — ABNORMAL HIGH (ref 150–400)
RBC: 3.81 MIL/uL — ABNORMAL LOW (ref 3.87–5.11)
RBC: 3.47 MIL/uL — ABNORMAL LOW (ref 3.87–5.11)

## 2011-01-02 LAB — GLUCOSE, CAPILLARY
Glucose-Capillary: 93 mg/dL (ref 70–99)
Glucose-Capillary: 104 mg/dL — ABNORMAL HIGH (ref 70–99)
Glucose-Capillary: 87 mg/dL (ref 70–99)
Glucose-Capillary: 91 mg/dL (ref 70–99)

## 2011-01-03 LAB — GLUCOSE, CAPILLARY
Glucose-Capillary: 109 mg/dL — ABNORMAL HIGH (ref 70–99)
Glucose-Capillary: 118 mg/dL — ABNORMAL HIGH (ref 70–99)
Glucose-Capillary: 84 mg/dL (ref 70–99)

## 2011-01-03 LAB — CBC
Hemoglobin: 9.7 g/dL — ABNORMAL LOW (ref 12.0–15.0)
RBC: 3.25 MIL/uL — ABNORMAL LOW (ref 3.87–5.11)
WBC: 10.6 10*3/uL — ABNORMAL HIGH (ref 4.0–10.5)

## 2011-01-08 NOTE — Discharge Summary (Addendum)
  NAMEMYCHELE, [name] NO.:  [id number]  MEDICAL RECORD NO.:  [id number]          PATIENT TYPE:  INP  LOCATION:  6730                         FACILITY:  [facility]  PHYSICIAN:  Peggye Pitt, M.D. DATE OF BIRTH:  23-Jan-1941  DATE OF ADMISSION:  12/26/2010 DATE OF DISCHARGE:  01/02/2011                              DISCHARGE SUMMARY   PRIMARY CARE PHYSICIAN:  [name], MD  GASTROENTEROLOGIST:  Willis Modena, MD  DISCHARGE DIAGNOSES: 1. Constipation with large stool burden. 2. Mild gastrointestinal bleed secondary to anal fissure and stercoral     rectal ulcers. 3. Type 2 diabetes mellitus. 4. Hyperlipidemia. 5. Asthma. 6. History of bilateral breast cancer status post bilateral     mastectomies. 7. Osteopenia. 8. Allergic rhinitis. 9. Morbid obesity.  DISCHARGE MEDICATIONS: 1. Docusate 100 mg twice daily. 2. Hydrocortisone 2.5% cream twice daily. 3. MiraLax 17 grams twice daily. 4. Aspirin 81 mg daily. 5. Letrozole 2.5 mg daily. 6. Metformin 1000 mg daily. 7. Multivitamin 1 tablet daily. 8. Simvastatin 40 mg daily. 9. Singulair 10 mg daily. 10.Vitamin D3 1000 units daily. 11.Zinc, calcium, and magnesium over-the-counter 1 tablet daily.  DISPOSITION AND FOLLOWUP:  Mrs. [name] will be discharged home today in stable and improved condition.  Please note, she has had a prolonged hospital stay secondary to constipation and inability to have a bowel movement.  She will need to follow up with her PCP in about 2 weeks and with Dr. Dulce Sellar with [facility] in 3-4 weeks.  CONSULTATION THIS HOSPITALIZATION:  Willis Modena, MD  IMAGES AND PROCEDURES: 1. Acute abdominal series on December 27, 2010 that showed large amount     of stool throughout the colon consistent with constipation.  No     evidence of bowel obstruction or pneumoperitoneum with no acute     cardiopulmonary process. 2. A repeat abdominal x-ray on January 01, 2011 that showed an  interval decrease in the amount of colonic stool with persistent     diffuse gaseous distention of the colon.  The patient had a     flexible sigmoidoscopy on January 03, 2011 with findings of     impacted stool in the rectosigmoid.  Stercoral related ulcerations     without dominant ulcer or visible vessels.  Anal fissure noted.  HISTORY AND PHYSICAL:  For full details, please refer the dictation by Dr. Susie Cassette.  Dictation ended at this point.     Peggye Pitt, M.D.     EH/MEDQ  D:  01/02/2011  T:  01/03/2011  Job:  [id number]  cc:   [name], MD Willis Modena, MD  Electronically Signed by Peggye Pitt M.D. on 01/08/2011 08:05:45 AM

## 2011-01-09 NOTE — Op Note (Signed)
Summary: [facility]  [facility]   Imported By: Lanelle Bal 04/10/2010 11:01:52  _____________________________________________________________________  External Attachment:    Type:   Image     Comment:   External Document

## 2011-01-09 NOTE — Letter (Signed)
Summary: [facility]   Imported By: Lanelle Bal 05/10/2010 08:16:58  _____________________________________________________________________  External Attachment:    Type:   Image     Comment:   External Document

## 2011-01-09 NOTE — Progress Notes (Signed)
Summary: [facility] CPAP desens. 7 cwp, med. nasal pillows  Phone Note Other Incoming   Summary of Call: [facility] CPAP Desensitization- They fitted her with a nasal pillows mask, size med, and titrated pressure to 7 Initial call taken by: Waymon Budge MD,  August 24, 2010 9:01 AM    New/Updated Medications: * CPAP 7 ADVANCED Fisher [employer]kel medium nasal pillows

## 2011-01-09 NOTE — Progress Notes (Signed)
Summary: still waiting on call from advanced   Phone Note Call from Patient Call back at Home Phone [phone]   Caller: Patient Call For: young Summary of Call: pt still hasnt heard from [facility] about her cpap Initial call taken by: Lacinda Axon,  Apr 14, 2010 9:02 AM  Follow-up for Phone Call        LM for Lecretia at [facility] to call back regarding this order. Abigail Miyamoto RN  Apr 14, 2010 9:12 AM   Additional Follow-up for Phone Call Additional follow up Details #1::        LM for pt that [facility] would be calling her today to set up Cpap study. Abigail Miyamoto RN  Apr 14, 2010 10:47 AM

## 2011-01-09 NOTE — Progress Notes (Signed)
Summary: re:u/s  Spoke with [facility] in re: to u/s report from 04/20/2009.  Patient did have f/u u/s on 05/18/09 & 09/19/09.  Sarah from [place] will fax report to [place].  Patient is due for screening mmg 04/2010. Shary Decamp  January 25, 2010 4:15 PM

## 2011-01-09 NOTE — Progress Notes (Signed)
Summary: referral to Dr. Yolanda Bonine  Phone Note Call from Patient Call back at [facility] Phone [phone]   Summary of Call: Patient requesting a referral to Dr. Yolanda Bonine.  Patient went to her surgeon & he noticed a "irregular lesion" on left side (masectomy site).  Patient would like to have Dr. Yolanda Bonine look at it. Referral done [facility]  March 15, 2010 3:09 PM

## 2011-01-09 NOTE — Consult Note (Signed)
Summary: [facility]  [facility]   Imported By: Lanelle Bal 02/16/2010 12:40:39  _____________________________________________________________________  External Attachment:    Type:   Image     Comment:   External Document

## 2011-01-09 NOTE — Op Note (Signed)
Summary: Addendum US Guided Biopsy/[facility]  Addendum [facility]   Imported By: Lanelle Bal 04/11/2010 10:23:15  _____________________________________________________________________  External Attachment:    Type:   Image     Comment:   External Document

## 2011-01-09 NOTE — Assessment & Plan Note (Signed)
Summary: rov/ mbw   Primary Provider/Referring Provider:  Drue Novel  CC:  Follow up visit-OSA(trying to use CPAP at least 4 hours a night-hard to get use to) and asthma and allergic rhinitis is doing good.[name]  History of Present Illness:  March 03, 2010- OSA-  Had remained compliant with CPAP at 11. Dentist her a small bite guard device to prevent grinding teeth. Her husband told her that wearng that alone stopped her snoring and apnea, so she stopped CPAP 2 months ago. She takes occasional naps to cope with helping her husband who has cancer. Uses i/2 x 1mg  lorazepam for sleep- well tolerated.  May 15, 2010 -OSA Oxygen sat dropped too much on home ONOX with oral appliance instead of CPAP. Reviewed with her She spent over 3 hours with sat les than 89%. The newest mask stays on, but pulls uncomfortably. She is going to take advantage of a CPAP open house at [facility] to discuss her pressure and mask fit.   Husband recently died of leukemia and heart failure in May. She treated a chest cold otc and is resolving now.  August 17, 2010- OSA, Asthma, Hx breast CA  Trying still to get used to CPAP. Trying to use it at least 4 hours/ night. Pressure is at 7/ Advanced. She still finds it unpleasant/ uncomfortable and prevents sleep.  Asthma is much improved. Has used her rescue inhaler only 3 times since last here. Not using Symbicort at all. Had flu vax 2 weeks ago. Completed XRT for her recurrent breast cancer about 2 months ago w/ f/u planned.  Asthma History    Asthma Control Assessment:    Age range: 12+ years    Symptoms: 0-2 days/week    Nighttime Awakenings: 0-2/month    Interferes w/ normal activity: no limitations    SABA use (not for EIB): 0-2 days/week    Asthma Control Assessment: Well Controlled   Preventive Screening-Counseling & Management  Alcohol-Tobacco     Smoking Status: quit     Year Quit: 42 years ago     Pack years: 1ppd x 40 yrs.  Current Medications (verified): 1)   Simvastatin 40 Mg Tabs (Simvastatin) .[name].. 1 By Mouth  Every Morning 2)  Glucophage 1000 Mg  Tabs (Metformin Hcl) .... Take 1 Tablet By Mouth Once Daily 3)  Singulair 10 Mg  Tabs (Montelukast Sodium) .[name].. 1 By Mouth Qd 4)  Proair Hfa 108 (90 Base) Mcg/act Aers (Albuterol Sulfate) .... 2 Puffs Four Times A Day As Needed 5)  Symbicort 160-4.5 Mcg/act  Aero (Budesonide-Formoterol Fumarate) .... Two Puffs Twice Daily - Due Office Visit Before Next Refill 6)  Prilosec Otc 20 Mg  Tbec (Omeprazole Magnesium) .... Take 1 Tablet By Mouth Once A Day As Needed 7)  Mucinex Dm 30-600 Mg  Tb12 (Dextromethorphan-Guaifenesin) .[name].. 1-2 Tabs By Mouth Every 12 Hours As Needed As Needed 8)  Centrum Silver   Tabs (Multiple Vitamins-Minerals) .... Take 1 Tablet By Mouth Once A Day 9)  Citracal + D 250-200 Mg-Unit  Tabs (Calcium Citrate-Vitamin D) .... 2 Once Daily 10)  One Touch Ultra Test Strips .... Checks Blood Sugar 1x/day, Dx 250.00 11)  Onetouch Ultrasoft Lancets   Misc (Lancets) .... Use As Directed 12)  Cetirizine Hcl 10 Mg Tabs (Cetirizine Hcl) .... Once Daily As Needed 13)  Cpap 7 Advanced 14)  Femara 2.5 Mg Tabs (Letrozole) .[name].. 1 A Day  Allergies (verified): 1)  ! * Bp Checks in Wrist Only 2)  Penicillin G Potassium (Penicillin G Potassium)  Past History:  Past Surgical History: Last updated: 01/20/2010 Appendectomy MASTECTOMY, BILATERAL, HX OF  ?pilonidal cyst surgery  Family History: Last updated: 01/20/2010 breast Cancer-- GM, sister colon cancer--no diabetes-- B . 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 Stopped smoking  at age 71 - 1 ppd x 5 years Alcohol use-no exercise-- no, likes to go back     Risk Factors: Smoking Status: quit (08/17/2010)  Past Medical History: Diabetes mellitus, type II Hyperlipidemia Asthma, Dx in adulthood (70y/o)                    -  PFT 09/08/09 FEV1 1.70/81%; FEV1/FVC 0.75; + resp to dilator; Nl LV/ DLCO breast cancer, hx of,  BILATERAL diagnosed in 2006, recurred 2011- Dr Donnie Coffin                     Drue Flirt, XRT, Femara Colonic polyps, hx of Osteopenia Ovarian Mass, resolved Allergic rhinitis OSA CPAP intolerant- NPSG 08/09/03- AHI 66/hr PERIPHERAL NEUROPATHY   OBESITY (ICD-278.00) negative stress test in 2004  Review of Systems      See HPI  The patient denies anorexia, fever, weight loss, weight gain, vision loss, decreased hearing, hoarseness, chest pain, syncope, dyspnea on exertion, peripheral edema, prolonged cough, headaches, hemoptysis, abdominal pain, melena, and severe indigestion/heartburn.         hot flashes  Vital Signs:  Patient profile:   70 year old female Height:      64 inches Weight:      206.13 pounds BMI:     35.51 O2 Sat:      94 % on Room air Pulse rate:   79 / minute BP sitting:   132 / 84  (left arm) Cuff size:   regular  Vitals Entered By: Reynaldo Minium CMA (August 17, 2010 2:55 PM)  O2 Flow:  Room air CC: Follow up visit-OSA(trying to use CPAP at least 4 hours a night-hard to get use to), asthma and allergic rhinitis is doing good. Comments BP taken in Left Wrist.Katie Welchel CMA  August 17, 2010 2:55 PM    Physical Exam  Additional Exam:  General: A/Ox3; pleasant and cooperative, NAD, SKIN: no rash, lesions NODES: no lymphadenopathy HEENT: [place]/AT, EOM- WNL, Conjuctivae- clear, PERRLA, TM-WNL, Nose-reddened area inferior turbinate on right, Throat- clear and wnl, Mallampati  II-III NECK: Supple w/ fair ROM, JVD- none, normal carotid impulses w/o bruits Thyroid- CHEST: dry crackles, unlabored without cough or wheeze HEART: RRR, no m/g/r heard ABDOMEN: Overweight FUX:NATF, nl pulses, no edema  NEURO: Grossly intact to observation      Impression & Recommendations:  Problem # 1:  OBSTRUCTIVE SLEEP APNEA (ICD-327.23)  She is having trouble with compliance but is  refreshingly open about it. I will steer her to the Sleep Center for CPAP desensitization/.  Problem # 2:  ASTHMA (ICD-493.90) Asthma seems mild/ intermittent and she isn't needing a maintenance therapy now. On exam I hear crackles she isn't aware of. These may relate to her cancer therapy. As long as she feels stable and comfortable, I decided to wait on CXR until her next oncology and OncRad f/u. Dx of asthma favored over COPD because of reversibiity on PFT, despite smoking hx.  Medications Added to Medication List This Visit: 1)  Simvastatin 40 Mg Tabs (Simvastatin) .[name].. 1 by mouth  every  morning  Other Orders: Est. Patient Level III (60454) Sleep Disorder Referral (Sleep Disorder)  Patient Instructions: 1)  Please schedule a follow-up appointment in 3 months. 2)  See [facility] to set up a CPAP desensitization meeting with the Sleep Center staff. 3)  Ok to use the Proair up to 4 times daily if needed for asthma.  4)  If you find you are having more cough and shortness of breath, let me know. I anticipate that your doctors will want to do a chest xray as part of theri follow up.

## 2011-01-09 NOTE — Letter (Signed)
Summary: [employer] Clearance Form  Fitness Program Clearance Form   Imported By: Lanelle Bal 01/24/2010 13:51:30  _____________________________________________________________________  External Attachment:    Type:   Image     Comment:   External Document

## 2011-01-09 NOTE — Assessment & Plan Note (Signed)
Summary: 2 months/apc   Primary Provider/Referring Provider:  Drue Novel  CC:  2 month follow up visit-sleep.Using CPAP at times-adjusted yesterday-unable to get to 4 hours a night-cold air bothers pt(blast feeling from machine)..  History of Present Illness: February 09, 2010- Asthma, allergic rhinitis No major problems over the past year. 2 days ago she was driving with window open when she had sudden coughing with phlegm in mouth. Cough drop worked better than inhaler. Cough since then has been thick mucus plugs yellow green.Belches. Takes acid blocker intermittently. Has  been using her Symbicort once daily and Proair if needed. Dropped off Spiriva, thinking it might have caused the acute episode somehow. Took nothing today. occasionally wakes from sleep coughing. coughs after eating. Nose stays dry, thirsty. PFT 09/01/09 CXR- 8/10  Apr 10, 2010- Asthma, allergic rhinitis, OSA Has recurrent left breast cancer for surgery by Dr Johna Sheriff May 10- day surgery. She is aware of dyspnea after effort of watering flowers. Some pain across the strap muscles of her back- bone scan is pending. Blames pollen cough with yellow for some wheeze. Denies fever, blood or chest pain. Using cetirizine and Singulair. After antibiotic last visit she was using rescue inhaler more than once daily. Now uses it less than once daily.  Morning cough with thick phlegm after up and moving around.   June 15, 2010- Asthma, allergic rhinitis, breast cancer, OSA She is struggling with CPAP. Sometimes feels she can't breathe through it. At other times it feels much too strong and hurts the right maxillary area. Dry nose. Asthma control has been good.    Asthma History    Initial Asthma Severity Rating:    Age range: 12+ years    Symptoms: 0-2 days/week    Nighttime Awakenings: 0-2/month    Interferes w/ normal activity: no limitations    SABA use (not for EIB): 0-2 days/week    Asthma Severity Assessment:  Intermittent   Preventive Screening-Counseling & Management  Alcohol-Tobacco     Smoking Status: quit     Year Quit: 42 years ago  Current Medications (verified): 1)  Simvastatin 40 Mg Tabs (Simvastatin) .Marland Kitchen.. 1 By Mouth Qhs 2)  Glucophage 1000 Mg  Tabs (Metformin Hcl) .... Take 1 Tablet By Mouth Once Daily 3)  Singulair 10 Mg  Tabs (Montelukast Sodium) .Marland Kitchen.. 1 By Mouth Qd 4)  Proair Hfa 108 (90 Base) Mcg/act Aers (Albuterol Sulfate) .... 2 Puffs Four Times A Day As Needed 5)  Symbicort 160-4.5 Mcg/act  Aero (Budesonide-Formoterol Fumarate) .... Two Puffs Twice Daily - Due Office Visit Before Next Refill 6)  Prilosec Otc 20 Mg  Tbec (Omeprazole Magnesium) .... Take 1 Tablet By Mouth Once A Day As Needed 7)  Mucinex Dm 30-600 Mg  Tb12 (Dextromethorphan-Guaifenesin) .Marland Kitchen.. 1-2 Tabs By Mouth Every 12 Hours As Needed As Needed 8)  Centrum Silver   Tabs (Multiple Vitamins-Minerals) .... Take 1 Tablet By Mouth Once A Day 9)  Citracal + D 250-200 Mg-Unit  Tabs (Calcium Citrate-Vitamin D) .... 2 Once Daily 10)  One Touch Ultra Test Strips .... Checks Blood Sugar 1x/day, Dx 250.00 11)  Onetouch Ultrasoft Lancets   Misc (Lancets) .... Use As Directed 12)  Cetirizine Hcl 10 Mg Tabs (Cetirizine Hcl) .... Once Daily As Needed 13)  Cpap 15  Allergies (verified): 1)  ! * Bp Checks in Wrist Only 2)  Penicillin G Potassium (Penicillin G Potassium)  Past History:  Past Medical History: Last updated: 04/07/2010 Diabetes mellitus, type II  Hyperlipidemia Asthma, Dx in adulthood (70y/o) breast cancer, hx of,  BILATERAL diagnosed in 2006, recurred 2011 Colonic polyps, hx of Osteopenia Ovarian Mass, resolved Allergic rhinitis OSA CPAP intolerant- NPSG 08/09/03- AHI 66/hr PERIPHERAL NEUROPATHY   OBESITY (ICD-278.00) negative stress test in 2004  Past Surgical History: Last updated: 01/20/2010 Appendectomy MASTECTOMY, BILATERAL, HX OF  ?pilonidal cyst surgery  Family History: Last updated:  01/20/2010 breast Cancer-- GM, sister colon cancer--no diabetes-- B . 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    Vital Signs:  Patient profile:   70 year old female Height:      64 inches Weight:      205.5 pounds BMI:     35.40 O2 Sat:      96 % on Room air Pulse rate:   88 / minute BP sitting:   130 / 98  (right arm) Cuff size:   regular  Vitals Entered By: Reynaldo Minium CMA (June 15, 2010 1:50 PM)  O2 Flow:  Room air CC: 2 month follow up visit-sleep.Using CPAP at times-adjusted yesterday-unable to get to 4 hours a night-cold air bothers pt(blast feeling from machine). Comments BP taken on right wrist.Katie [facility] CMA  June 15, 2010 1:52 PM    Physical Exam  Additional Exam:  General: A/Ox3; pleasant and cooperative, NAD, SKIN: no rash, lesions NODES: no lymphadenopathy HEENT: [place]/AT, EOM- WNL, Conjuctivae- clear, PERRLA, TM-WNL, Nose-reddened area inferior turbinate on right, Throat- clear and wnl, Mallampati  II NECK: Supple w/ fair ROM, JVD- none, normal carotid impulses w/o bruits Thyroid- CHEST: wheeze in bases with deep breath, decreased breath sounds HEART: RRR, no m/g/r heard ABDOMEN: Overweight ZOX:WRUE, nl pulses, no edema  NEURO: Grossly intact to observation      Impression &  Recommendations:  Problem # 1:  OBSTRUCTIVE SLEEP APNEA (ICD-327.23)  She is really trying with CPAP, which she had not tolerated originally years ago. We will try an empiric reduction to 7 cwp for tolerance and work from there.  Problem # 2:  ALLERGIC RHINITIS (ICD-477.9)  Nasal discomfort now may be composite of allergy and CPAP issues, but we are working for now on hte CPAP. Try nasal saline gel. Her updated medication list for this problem includes:    Cetirizine Hcl 10 Mg Tabs (Cetirizine hcl) ..... Once daily as needed  Problem # 3:  ASTHMA (ICD-493.90) Not actively wheezing.  Medications Added to Medication List This Visit: 1)  Prilosec Otc 20 Mg Tbec (Omeprazole magnesium) .... Take 1 tablet by mouth once a day as needed 2)  Cpap 7 Advanced   Other Orders: Est. Patient Level III (45409) DME Referral (DME) Sleep Disorder Referral (Sleep Disorder)  Patient Instructions: 1)  Please schedule a follow-up appointment in 2 months. 2)  See [facility] for referral to sleep center for CPAP desensitization, and to arrange cpap pressure change to 7.

## 2011-01-09 NOTE — Progress Notes (Signed)
Summary: CPAP auto download change to 15. poor compliance  Phone Note Other Incoming   Summary of Call: CPAP auto download 15 cwp AHI 0.8, but insufficient time / compliance. Will change pressure. Initial call taken by: Waymon Budge MD,  June 05, 2010 8:16 PM    New/Updated Medications: * CPAP 15  Prescriptions: CPAP 15   #1 x prn   Entered and Authorized by:   Waymon Budge MD   Signed by:   Waymon Budge MD on 06/05/2010   Method used:   Historical   RxID:   [id number]

## 2011-01-09 NOTE — Assessment & Plan Note (Signed)
Summary: CPX/NS/KDC  Flu Vaccine Consent Questions     Do you have a history of severe allergic reactions to this vaccine? no    Any prior history of allergic reactions to egg and/or gelatin? no    Do you have a sensitivity to the preservative Thimersol? no    Do you have a past history of Guillan-Barre Syndrome? no    Do you currently have an acute febrile illness? no    Have you ever had a severe reaction to latex? no    Vaccine information given and explained to patient? yes    Are you currently pregnant? no    Lot Number:[id number]   Exp Date:06/08/2010   Site Given  Left Deltoid IM Shary Decamp  January 20, 2010 4:13 PM  Vital Signs:  Patient profile:   70 year old female Height:      64 inches Weight:      206 pounds BMI:     35.49 Pulse rate:   76 / minute BP sitting:   130 / 90  Vitals Entered By: Kandice Hams (January 20, 2010 12:26 PM) CC: cpx pt wants sample of albuterol    History of Present Illness: Diabetes-- ambulatory CBGs usually wnl   Hyperlipidemia-- good medication compliance   Asthma-- now on spiriva ,doing well as long as she takes it uses albuterol once daily b/c congestion in AM  breast cancer -- had a u/s 5-10, repeat u/s?   Allergic rhinitis-- fair control   yearly, chart reviewed   Allergies: 1)  Penicillin G Potassium (Penicillin G Potassium)  Past History:  Past Medical History: Diabetes mellitus, type II Hyperlipidemia Asthma, Dx in adulthood (70y/o) breast cancer, hx of,  BILATERAL diagnosed in 2006 Colonic polyps, hx of Osteopenia Ovarian Mass, resolved Allergic rhinitis OSA CPAP intolerant- NPSG 08/09/03- AHI 66/hr PERIPHERAL NEUROPATHY   OBESITY (ICD-278.00) negative stress test in 2004  Past Surgical History: Appendectomy MASTECTOMY, BILATERAL, HX OF  ?pilonidal cyst surgery  Family History: Reviewed history from 10/08/2008 and no changes required. breast Cancer-- GM, sister colon cancer--no diabetes-- B  . 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 GI:  Denies bloody stools, diarrhea, nausea, and vomiting. GU:  Denies dysuria and hematuria. Psych:  Denies anxiety and depression.  Physical Exam  General:  alert, well-developed, and overweight-appearing.   Lungs:  normal respiratory effort, no intercostal retractions, no accessory muscle use, and normal breath sounds.   Heart:  normal rate, regular rhythm, and no murmur.   Abdomen:  soft, non-tender, no distention, no masses, no guarding, and no rigidity.   Pulses:  normal female and pedal pulses bilaterally Extremities:  no pretibial edema bilaterally  Psych:  Cognition and judgment appear intact. Alert and cooperative with normal attention span and concentration.  not anxious appearing and not depressed appearing.     Impression & Recommendations:  Problem # 1:  ? of PVD WITH CLAUDICATION (ICD-443.89) she has chronic symptoms that suggest claudication in the past, a commercial ABI done at work showed some cholesterol build up in the legs had a  normal ABIs on November 2007 done because leg pain suspicious for claudication plan: Observation  Problem #  2:  HEALTH SCREENING (ICD-V70.0) Td 2003 pneumonia shot 2008 flu shot  today shingles shot 2009  C scope 2006 (-) but incomplete, (-) ACBE: next screening 2011 (GI Dr Madilyn Fireman per patient)  see Gyn: Dr. Pennie Rushing      Problem # 3:  HYPERLIPIDEMIA (ICD-272.4)  labs Her updated medication list for this problem includes:    Simvastatin 40 Mg Tabs (Simvastatin) .Marland Kitchen... 1 by mouth qhs  Her updated medication list for this problem includes:    Simvastatin 40 Mg Tabs (Simvastatin) .Marland Kitchen... 1 by mouth  qhs  Labs Reviewed: SGOT: 24 (06/09/2009)   SGPT: 13 (06/09/2009)   HDL:48.8 (10/08/2008)  LDL:98 (10/08/2008)  Chol:166 (10/08/2008)  Trig:94 (10/08/2008)  Orders: TLB-ALT (SGPT) (84460-ALT) TLB-AST (SGOT) (84450-SGOT) TLB-TSH (Thyroid Stimulating Hormone) (84443-TSH) TLB-Lipid Panel (80061-LIPID)  Problem # 4:  DIABETES MELLITUS, TYPE II (ICD-250.00)  printed material provided regards diet, exercise, eye care, feet care  plans to start a exercise program!! Her updated medication list for this problem includes:    Glucophage 1000 Mg Tabs (Metformin hcl) .Marland Kitchen... Take 1 tablet by mouth once daily    Labs Reviewed: Creat: 0.6 (06/09/2009)    Reviewed HgBA1c results: 6.6 (06/09/2009)  7.0 (02/14/2009)  Orders: Venipuncture (16109) TLB-BMP (Basic Metabolic Panel-BMET) (80048-METABOL) TLB-CBC Platelet - w/Differential (85025-CBCD) TLB-A1C / Hgb A1C (Glycohemoglobin) (83036-A1C) TLB-Microalbumin/Creat Ratio, Urine (82043-MALB)  Problem # 5:  OSTEOPENIA (ICD-733.90) per gyn  Her updated medication list for this problem includes:    Citracal + D 250-200 Mg-unit Tabs (Calcium citrate-vitamin d) .Marland Kitchen... 2 once daily  Problem # 6:  BREAST CANCER, HX OF (ICD-V10.3) had a ultrasound of the axillary area 5/ 10, will check with radiology to be sure she doesn't need to repeat ultrasound  Problem # 7:  ASTHMA (ICD-493.90) not well controlled, since she started spiriva  is needing more albuterol. Advised to contact Dr. Maple Hudson Her updated medication list for this problem includes:    Singulair 10 Mg Tabs (Montelukast sodium) .Marland Kitchen... 1 by mouth qd    Spiriva Handihaler 18 Mcg Caps (Tiotropium bromide monohydrate) ..... Inhale contents of 1 capsule daily    Albuterol 90 Mcg/act Aers (Albuterol) ..... Inhale 2 puff as directed four times a day as needed - due office visit    Symbicort 160-4.5 Mcg/act Aero (Budesonide-formoterol fumarate) .Marland Kitchen..Marland Kitchen Two puffs twice daily - due office visit before next  refill  Complete Medication List: 1)  Simvastatin 40 Mg Tabs (Simvastatin) .Marland Kitchen.. 1 by mouth qhs 2)  Glucophage 1000 Mg Tabs (Metformin hcl) .... Take 1 tablet by mouth once daily 3)  Singulair 10 Mg Tabs (Montelukast sodium) .Marland Kitchen.. 1 by mouth qd 4)  Spiriva Handihaler 18 Mcg Caps (Tiotropium bromide monohydrate) .... Inhale contents of 1 capsule daily 5)  Albuterol 90 Mcg/act Aers (Albuterol) .... Inhale 2 puff as directed four times a day as needed - due office visit 6)  Symbicort 160-4.5 Mcg/act Aero (Budesonide-formoterol fumarate) .... Two puffs twice daily - due office visit before next refill 7)  Prilosec Otc 20 Mg Tbec (Omeprazole magnesium) .... Take 1 tablet by mouth once a day 8)  Mucinex Dm 30-600 Mg Tb12 (Dextromethorphan-guaifenesin) .Marland Kitchen.. 1-2 tabs by mouth every 12 hours as needed as needed 9)  Zyrtec Allergy 10 Mg Tabs (Cetirizine hcl) .... Once daily prn 10)  Centrum Silver Tabs (Multiple vitamins-minerals) .... Take 1 tablet by mouth once a day 11)  Citracal + D 250-200 Mg-unit Tabs (Calcium citrate-vitamin d) .... 2 once daily 12)  One Touch Ultra  Test Strips  .... As directed 13)  Onetouch Ultrasoft Lancets Misc (Lancets) .... Use as directed  Other Orders: Flu Vaccine 42yrs + (45409) Administration Flu vaccine - MCR (W1191)  \  Patient Instructions: 1)  Please schedule a follow-up appointment in 4 months .

## 2011-01-09 NOTE — Letter (Signed)
Summary: finished  radiation therapy  [facility]   Imported By: Lanelle Bal 08/24/2010 09:04:46  _____________________________________________________________________  External Attachment:    Type:   Image     Comment:   External Document

## 2011-01-09 NOTE — Assessment & Plan Note (Signed)
Summary: SPOT ON LFT SIDE OF FACE X 3 WEEKS///SPH   Vital Signs:  Patient profile:   70 year old female Weight:      204 pounds Pulse rate:   88 / minute Pulse rhythm:   regular BP sitting:   128 / 86  (left arm) Cuff size:   regular  Vitals Entered By: Army Fossa CMA (October 17, 2010 10:07 AM) CC: Spot on (L) side of face x 3 weeks. Comments Tender Itches [facility] [address]    History of Present Illness: 3 weeks ago developed a group of "pimples" in the left side of the face. The area is now irritated mostly because she has been trying to use a CPAP at night. The area is itching with occasional pain  Review of systems Denies any discharge from the area, no fever She has no swelling in the left face other than he "pimples"  Current Medications (verified): 1)  Simvastatin 40 Mg Tabs (Simvastatin) .Marland Kitchen.. 1 By Mouth  Every Morning 2)  Glucophage 1000 Mg  Tabs (Metformin Hcl) .... Take 1 Tablet By Mouth Once Daily 3)  Singulair 10 Mg  Tabs (Montelukast Sodium) .Marland Kitchen.. 1 By Mouth Qd 4)  Proair Hfa 108 (90 Base) Mcg/act Aers (Albuterol Sulfate) .... 2 Puffs Four Times A Day As Needed 5)  Prilosec Otc 20 Mg  Tbec (Omeprazole Magnesium) .... Take 1 Tablet By Mouth Once A Day As Needed 6)  Mucinex Dm 30-600 Mg  Tb12 (Dextromethorphan-Guaifenesin) .Marland Kitchen.. 1-2 Tabs By Mouth Every 12 Hours As Needed As Needed 7)  Centrum Silver   Tabs (Multiple Vitamins-Minerals) .... Take 1 Tablet By Mouth Once A Day 8)  Citracal + D 250-200 Mg-Unit  Tabs (Calcium Citrate-Vitamin D) .... 2 Once Daily 9)  One Touch Ultra Test Strips .... Checks Blood Sugar 1x/day, Dx 250.00 10)  Onetouch Ultrasoft Lancets   Misc (Lancets) .... Use As Directed 11)  Cetirizine Hcl 10 Mg Tabs (Cetirizine Hcl) .... Once Daily As Needed 12)  Femara 2.5 Mg Tabs (Letrozole) .Marland Kitchen.. 1 A Day  Allergies (verified): 1)  ! * Bp Checks in Wrist Only 2)  Penicillin G Potassium (Penicillin G Potassium)  Past History:  Past Medical  History: Reviewed history from 08/17/2010 and no changes required. Diabetes mellitus, type II Hyperlipidemia Asthma, Dx in adulthood (70y/o)                    - PFT 09/08/09 FEV1 1.70/81%; FEV1/FVC 0.75; + resp to dilator; Nl LV/ DLCO breast cancer, hx of,  BILATERAL diagnosed in 2006, recurred 2011- Dr Donnie Coffin                     Drue Flirt, XRT, Femara Colonic polyps, hx of Osteopenia Ovarian Mass, resolved Allergic rhinitis OSA CPAP intolerant- NPSG 08/09/03- AHI 66/hr PERIPHERAL NEUROPATHY   OBESITY (ICD-278.00) negative stress test in 2004  Past Surgical History: Reviewed history from 01/20/2010 and no changes required. Appendectomy MASTECTOMY, BILATERAL, HX OF  ?pilonidal cyst surgery  Social History: Reviewed history from 01/20/2010 and no changes required. 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   Head:  face symmetric no enlargment of the salivary gland  Neck:  no LADs   Skin:  at the left side of the face  by the jaw angle she has a 1.5 cm area of redness, scaliness, on close examination with a magnifier I can see minute blisters, some dry mouth, some with white discharge. There is 2 small peripheral lesions   Impression & Recommendations:  Problem # 1:  HERPES SIMPLEX INFECTION (ICD-054.9) Suspect herpetic rash with possible secondary infection Plan: see instructions   Complete Medication List: 1)  Simvastatin 40 Mg Tabs (Simvastatin) .Marland Kitchen.. 1 by mouth  every morning 2)  Glucophage 1000 Mg Tabs (Metformin hcl) .... Take 1 tablet by mouth once daily 3)  Singulair 10 Mg Tabs (Montelukast sodium) .Marland Kitchen.. 1 by mouth qd 4)  Proair Hfa 108 (90 Base) Mcg/act Aers (Albuterol sulfate) .... 2 puffs four times a day as needed 5)  Prilosec Otc 20 Mg Tbec (Omeprazole magnesium) .... Take 1 tablet by mouth once  a day as needed 6)  Mucinex Dm 30-600 Mg Tb12 (Dextromethorphan-guaifenesin) .Marland Kitchen.. 1-2 tabs by mouth every 12 hours as needed as needed 7)  Centrum Silver Tabs (Multiple vitamins-minerals) .... Take 1 tablet by mouth once a day 8)  Citracal + D 250-200 Mg-unit Tabs (Calcium citrate-vitamin d) .... 2 once daily 9)  One Touch Ultra Test Strips  .... Checks blood sugar 1x/day, dx 250.00 10)  Onetouch Ultrasoft Lancets Misc (Lancets) .... Use as directed 11)  Cetirizine Hcl 10 Mg Tabs (Cetirizine hcl) .... Once daily as needed 12)  Femara 2.5 Mg Tabs (Letrozole) .Marland Kitchen.. 1 a day 13)  Zovirax 5 % Oint (Acyclovir) .... Apply 5 times a day for 5 days 14)  Doxycycline Hyclate 100 Mg Caps (Doxycycline hyclate) .... One by mouth twice a day for 5 days  Patient Instructions: 1)  apply the ointment 5 times a day for 5 days. 2)  If itching, you may like to use some hydrocortisone 1% over-the-counter cream 3)  doxycycline for 5 days 4)  Call if not better by next week Prescriptions: DOXYCYCLINE HYCLATE 100 MG CAPS (DOXYCYCLINE HYCLATE) one by mouth twice a day for 5 days  #10 x 0   Entered and Authorized by:   Nolon Rod. Lanika Colgate MD   Signed by:   Nolon Rod. Zera Markwardt MD on 10/17/2010   Method used:   [facility] to        [facility]  [address] [phone]       [address]       [place], [place]  [id number]       Ph: [phone] or [phone]       Fax: [phone]   RxID:   [id number] ZOVIRAX 5 % OINT (ACYCLOVIR) apply 5 times a day for 5 days  #1 x 0   Entered and Authorized by:   Nolon Rod. Jaice Digioia MD   Signed by:   Nolon Rod. Grayton Lobo MD on 10/17/2010   Method used:   [facility]        [facility]  [address] [phone][facility] (retail)       [address]       [place], [place]  [address]       Ph: [phone] or [phone]       Fax: [phone]   RxID:   [id number]    Orders Added: 1)  Est. Patient Level III [62831]

## 2011-01-09 NOTE — Letter (Signed)
[facility]   Imported By: Lanelle Bal 08/10/2010 10:27:46  _____________________________________________________________________  External Attachment:    Type:   Image     Comment:   External Document

## 2011-01-09 NOTE — Consult Note (Signed)
Summary: [facility]  [facility]   Imported By: Lanelle Bal 03/15/2010 12:19:52  _____________________________________________________________________  External Attachment:    Type:   Image     Comment:   External Document

## 2011-01-09 NOTE — Op Note (Signed)
Summary: [facility]  [facility]   Imported By: Lanelle Bal 04/03/2010 13:19:52  _____________________________________________________________________  External Attachment:    Type:   Image     Comment:   External Document

## 2011-01-09 NOTE — Letter (Signed)
Summary: [facility]  [facility]   Imported By: Lanelle Bal 01/24/2010 13:33:42  _____________________________________________________________________  External Attachment:    Type:   Image     Comment:   External Document

## 2011-01-09 NOTE — Assessment & Plan Note (Signed)
Summary: pain left shoulder//fd   Vital Signs:  Patient profile:   70 year old female Height:      64 inches Weight:      204.13 pounds BMI:     35.17 BP sitting:   130 / 80  Vitals Entered By: Kandice Hams (April 07, 2010 2:34 PM) CC: c/o left shoulder pain Comments dx with reccurence of left breast ca, due for radiation and bone scan next week.  Noticed shoulder pain today   History of Present Illness: 70 yo woman here today for L shoulder pain.  pt awoke this AM w/ severe pain.  tylenol w/ some relief, pain is 5/10.  pain is centered over muscles of shoulder blade.  slept on that side last night.  had a lipoma on the R side which was removed.  recently dx'd w/ recurrent breast cancer on L, has bone scan next week.  has surgery coming up 5/10.  Medications Prior to Update: 1)  Simvastatin 40 Mg Tabs (Simvastatin) .Marland Kitchen.. 1 By Mouth Qhs 2)  Glucophage 1000 Mg  Tabs (Metformin Hcl) .... Take 1 Tablet By Mouth Once Daily 3)  Singulair 10 Mg  Tabs (Montelukast Sodium) .Marland Kitchen.. 1 By Mouth Qd 4)  Proair Hfa 108 (90 Base) Mcg/act Aers (Albuterol Sulfate) .... 2 Puffs Four Times A Day As Needed 5)  Symbicort 160-4.5 Mcg/act  Aero (Budesonide-Formoterol Fumarate) .... Two Puffs Twice Daily - Due Office Visit Before Next Refill 6)  Prilosec Otc 20 Mg  Tbec (Omeprazole Magnesium) .... Take 1 Tablet By Mouth Once A Day 7)  Mucinex Dm 30-600 Mg  Tb12 (Dextromethorphan-Guaifenesin) .Marland Kitchen.. 1-2 Tabs By Mouth Every 12 Hours As Needed As Needed 8)  Zyrtec Allergy 10 Mg  Tabs (Cetirizine Hcl) .... Once Daily Prn 9)  Centrum Silver   Tabs (Multiple Vitamins-Minerals) .... Take 1 Tablet By Mouth Once A Day 10)  Citracal + D 250-200 Mg-Unit  Tabs (Calcium Citrate-Vitamin D) .... 2 Once Daily 11)  One Touch Ultra Test Strips .... As Directed 12)  Onetouch Ultrasoft Lancets   Misc (Lancets) .... Use As Directed 13)  Zithromax Z-Pak 250 Mg Tabs (Azithromycin) .... 2 Today Then One Daily  Allergies (verified): 1)   ! * Bp Checks in Wrist Only 2)  Penicillin G Potassium (Penicillin G Potassium)  Past History:  Past Medical History: Diabetes mellitus, type II Hyperlipidemia Asthma, Dx in adulthood (70y/o) breast cancer, hx of,  BILATERAL diagnosed in 2006, recurred 2011 Colonic polyps, hx of Osteopenia Ovarian Mass, resolved Allergic rhinitis OSA CPAP intolerant- NPSG 08/09/03- AHI 66/hr PERIPHERAL NEUROPATHY   OBESITY (ICD-278.00) negative stress test in 2004  Review of Systems      See HPI  Physical Exam  General:  alert, well-developed, and overweight-appearing.   Msk:  limited forward flexion of shoulder- pt will go to 90 degrees and then stop out of fear of pain.  similar results w/ abduction.  full ROM w/ internal and external rotation but pt reports pain.  no TTP along clavicle, head of biceps tendon, AC joint.  + TTP over scapular muscles Pulses:  + 2 radial Extremities:  no C/C/E   Impression & Recommendations:  Problem # 1:  SHOULDER, PAIN (ICD-719.41) Assessment New pt's pain not true shoulder pain as it is not in the joint.  likely muscle spasm.  pt unable to take NSAIDs due to upcoming surgery.  start Vicodin as needed for severe pain and muscle relaxer.  encouraged ice and heat.  pt has  bone scan scheduled which will reveal if there are bony mets.  will need close f/u. Her updated medication list for this problem includes:    Vicodin 5-500 Mg Tabs (Hydrocodone-acetaminophen) .Marland Kitchen... 1-2 tabs every 6 hours as needed for pain    Cyclobenzaprine Hcl 10 Mg Tabs (Cyclobenzaprine hcl) .Marland Kitchen... 1 by mouth 2 times daily as needed for pain  Complete Medication List: 1)  Simvastatin 40 Mg Tabs (Simvastatin) .Marland Kitchen.. 1 by mouth qhs 2)  Glucophage 1000 Mg Tabs (Metformin hcl) .... Take 1 tablet by mouth once daily 3)  Singulair 10 Mg Tabs (Montelukast sodium) .Marland Kitchen.. 1 by mouth qd 4)  Proair Hfa 108 (90 Base) Mcg/act Aers (Albuterol sulfate) .... 2 puffs four times a day as needed 5)  Symbicort  160-4.5 Mcg/act Aero (Budesonide-formoterol fumarate) .... Two puffs twice daily - due office visit before next refill 6)  Prilosec Otc 20 Mg Tbec (Omeprazole magnesium) .... Take 1 tablet by mouth once a day 7)  Mucinex Dm 30-600 Mg Tb12 (Dextromethorphan-guaifenesin) .Marland Kitchen.. 1-2 tabs by mouth every 12 hours as needed as needed 8)  Zyrtec Allergy 10 Mg Tabs (Cetirizine hcl) .... Once daily prn 9)  Centrum Silver Tabs (Multiple vitamins-minerals) .... Take 1 tablet by mouth once a day 10)  Citracal + D 250-200 Mg-unit Tabs (Calcium citrate-vitamin d) .... 2 once daily 11)  One Touch Ultra Test Strips  .... As directed 12)  Onetouch Ultrasoft Lancets Misc (Lancets) .... Use as directed 13)  Zithromax Z-pak 250 Mg Tabs (Azithromycin) .... 2 today then one daily 14)  Vicodin 5-500 Mg Tabs (Hydrocodone-acetaminophen) .Marland Kitchen.. 1-2 tabs every 6 hours as needed for pain 15)  Cyclobenzaprine Hcl 10 Mg Tabs (Cyclobenzaprine hcl) .Marland Kitchen.. 1 by mouth 2 times daily as needed for pain  Patient Instructions: 1)  Please let us know if your pain is not improved in the next week 2)  Use the cyclobenzaprine (muscle relaxer) as needed- may cause drowsiness 3)  Use the Vicodin as needed for severe pain- do not take extra tylenol with this.  it's either tylenol or vicodin, not both.  may switch every 6 hrs as needed 4)  Use a heating pad 5)  Hang in there!! Prescriptions: CYCLOBENZAPRINE HCL 10 MG  TABS (CYCLOBENZAPRINE HCL) 1 by mouth 2 times daily as needed for pain  #20 x 0   Entered and Authorized by:   Neena Rhymes MD   Signed by:   Neena Rhymes MD on 04/07/2010   Method used:   Print then Give to Patient   RxID:   [id number] VICODIN 5-500 MG TABS (HYDROCODONE-ACETAMINOPHEN) 1-2 tabs every 6 hours as needed for pain  #45 x 0   Entered and Authorized by:   Neena Rhymes MD   Signed by:   Neena Rhymes MD on 04/07/2010   Method used:   Print then Give to Patient   RxID:   [id number]

## 2011-01-09 NOTE — Letter (Signed)
[facility]   Imported By: Lanelle Bal 06/19/2010 12:57:01  _____________________________________________________________________  External Attachment:    Type:   Image     Comment:   External Document

## 2011-01-09 NOTE — Progress Notes (Signed)
Summary: [facility] 3/15-- further genetic testing??  Phone Note From Other Clinic Call back at [phone]   Caller: Okey Regal [facility] Summary of Call: patient recently had some labs done and she would like to discuss w/ Dr. Drue Novel to see where to go from there Initial call taken by: Doristine Devoid,  February 20, 2010 10:13 AM  Follow-up for Phone Call        left message on machine for Okey Regal to return call Shary Decamp  February 21, 2010 2:40 PM   Okey Regal left message on VM: "BRCA1 & BRCA2 test were negative.  The only thing left to offer patient is the BART test.  There is a less than 1% chance of patient having this.  The BART test looks for very large rearrangements in this area.  Do you want her to offer this test to the patient?  IF so, can she change the order?" Shary Decamp  February 21, 2010 3:42 PM   Additional Follow-up for Phone Call Additional follow up Details #1::        I'll have to discuss on RTC Tomas de Castro E. Robin Petrakis MD  February 28, 2010 8:32 PM  left message for Romeo Rabon  March 01, 2010 2:32 PM

## 2011-01-09 NOTE — Assessment & Plan Note (Signed)
Summary: using inhaler too often/apc   Primary Provider/Referring Provider:  Drue Novel  CC:  Follow up visit-using inhaler more often than usual..  History of Present Illness: 08/09/09- 67 yoF seen at kind request of Dr Drue Novel for allergy/ asthma evaluation, concerned about coughing, This has bothered her since around 1980 and tends to be worse early mornings and with exposure to humidity, dust, dogs, smoke, seasonal pollens and heat. Cough drops help more than albuterol. She remembers hayfever as a child, and was too short of breath for swimming lessons.. Her parents smoked and had hx of asthma. She recalls ER visits for this in the past. She has coughed more this summer and blames the heat. Her son is a smoker and she can smell it on his clothes when he visits. Daily cough and chest congestion with cough productive of white sputum but denies sinus congestion. Often coughs if upset. She was on prednisone x 8 years, until 11 years ago, for asthma. She denies reflux, choking and heartburn and says a trial acid blocker did not help. House 70 yrs old, crawl space, central AC, carpet. Brown color around vent ducts- she questions mold. Office in basement- damp in rain- has aircleaner. Had pneumovax,  February 09, 2010- Asthma, allergic rhinitis No major problems over the past year. 2 days ago she was driving with window open when she had sudden coughing with phlegm in mouth. Cough drop worked better than inhaler. Cough since then has been thick mucus plugs yellow green.Belches. Takes acid blocker intermittently. Has  been using her Symbicort once daily and Proair if needed. Dropped off Spiriva, thinking it might have caused the acute episode somehow. Took nothing today. occasionally wakes from sleep coughing. coughs after eating. Nose stays dry, thirsty. PFT 09/01/09 CXR- 8/10   Current Medications (verified): 1)  Simvastatin 40 Mg Tabs (Simvastatin) .Marland Kitchen.. 1 By Mouth Qhs 2)  Glucophage 1000 Mg  Tabs (Metformin Hcl)  .... Take 1 Tablet By Mouth Once Daily 3)  Singulair 10 Mg  Tabs (Montelukast Sodium) .Marland Kitchen.. 1 By Mouth Qd 4)  Proair Hfa 108 (90 Base) Mcg/act Aers (Albuterol Sulfate) .... 2 Puffs Four Times A Day As Needed 5)  Symbicort 160-4.5 Mcg/act  Aero (Budesonide-Formoterol Fumarate) .... Two Puffs Twice Daily - Due Office Visit Before Next Refill 6)  Prilosec Otc 20 Mg  Tbec (Omeprazole Magnesium) .... Take 1 Tablet By Mouth Once A Day 7)  Mucinex Dm 30-600 Mg  Tb12 (Dextromethorphan-Guaifenesin) .Marland Kitchen.. 1-2 Tabs By Mouth Every 12 Hours As Needed As Needed 8)  Zyrtec Allergy 10 Mg  Tabs (Cetirizine Hcl) .... Once Daily Prn 9)  Centrum Silver   Tabs (Multiple Vitamins-Minerals) .... Take 1 Tablet By Mouth Once A Day 10)  Citracal + D 250-200 Mg-Unit  Tabs (Calcium Citrate-Vitamin D) .... 2 Once Daily 11)  One Touch Ultra Test Strips .... As Directed 12)  Onetouch Ultrasoft Lancets   Misc (Lancets) .... Use As Directed  Allergies: 1)  ! * Bp Checks in Wrist Only 2)  Penicillin G Potassium (Penicillin G Potassium)  Past History:  Past Medical History: Last updated: 01/20/2010 Diabetes mellitus, type II Hyperlipidemia Asthma, Dx in adulthood (70y/o) breast cancer, hx of,  BILATERAL diagnosed in 2006 Colonic polyps, hx of Osteopenia Ovarian Mass, resolved Allergic rhinitis OSA CPAP intolerant- NPSG 08/09/03- AHI 66/hr PERIPHERAL NEUROPATHY   OBESITY (ICD-278.00) negative stress test in 2004  Past Surgical History: Last updated: 01/20/2010 Appendectomy MASTECTOMY, BILATERAL, HX OF  ?pilonidal cyst surgery  Family  History: Last updated: 01/20/2010 breast Cancer-- GM, sister colon cancer--no diabetes-- B . 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  The patient denies anorexia, fever, weight loss, weight gain, vision loss, decreased hearing, hoarseness, chest pain, syncope, dyspnea on exertion, peripheral edema, headaches, hemoptysis, abdominal pain, and severe indigestion/heartburn.    Vital Signs:  Patient profile:   70 year old female Height:      64 inches Weight:      210.25 pounds BMI:     36.22 O2 Sat:      97 % on Room air Pulse rate:   88 / minute BP sitting:   122 / 84  (right arm) Cuff size:   regular  Vitals Entered By: Reynaldo Minium CMA (February 09, 2010 4:00 PM)  O2 Flow:  Room air CC: Follow up visit-using inhaler more often than usual. Comments BP taken on right wrist due to medical conditions.Reynaldo Minium CMA  February 09, 2010 4:00 PM    Physical Exam  Additional Exam:  General: A/Ox3; pleasant and cooperative, NAD, SKIN: no rash, lesions NODES: no lymphadenopathy HEENT: [place]/AT, EOM- WNL, Conjuctivae- clear, PERRLA, TM-WNL, Nose- clear, Throat- clear and wnl, [place] II NECK: Supple w/ fair ROM, JVD- none, normal carotid impulses w/o bruits Thyroid- normal to palpation CHEST: wet squeaks and wheeze in bases, decreased breath sounds HEART: RRR, no m/g/r heard ABDOMEN: Soft and nl;  WJX:BJYN, nl pulses, no edema  NEURO: Grossly intact to observation      Impression & Recommendations:  Problem # 1:  ASTHMA (ICD-493.90) More asthmatic bronchitis starting about 3 months ago. She has been off Spiriva recently so we will stay off that, but emphasize regular maintenance use of Symbicort, rescue use of Proair, give a Zpak, and caution on reflux precautions.  Medications Added to Medication List This Visit: 1)  Proair Hfa 108 (90 Base) Mcg/act Aers (Albuterol sulfate) .... 2 puffs four times a day as needed 2)  Zithromax Z-pak 250 Mg Tabs (Azithromycin) .... 2 today then one daily  Other Orders: Est.  Patient Level III (82956)  Patient Instructions: 1)  Please schedule a follow-up appointment in 2 months. 2)  Scripts sent to drug store 3)  OK to stay off Spiriva for now and lets see if it was doing anything to help. 4)  use the Symbicort every day: 2 puffs and rinse mouth= maintenance controller 5)  Use Proair as a rescue inhlaer- 2 puffs up to 4 x daily when needed 6)  Watch for reflux of stomach juice, which may burn but may just feel like sudden water in your mouth. Don't lie down for an hour or so after you eat. Prescriptions: SYMBICORT 160-4.5 MCG/ACT  AERO (BUDESONIDE-FORMOTEROL FUMARATE) Two puffs twice daily - DUE OFFICE VISIT BEFORE NEXT REFILL  #1 x 30   Entered and Authorized by:   Waymon Budge MD   Signed by:   Waymon Budge MD on 02/09/2010   Method used:   [facility]  [address] [phone][facility] (retail[facility]  [address]       [place], [place]  [address]       Ph: [phone] or [phone]       Fax: [phone]   RxID:   [id number] PROAIR HFA 108 (90 BASE) MCG/ACT AERS (ALBUTEROL SULFATE) 2 puffs four times a day as needed  #1 x prn   Entered and Authorized by:   Waymon Budge MD   Signed by:   Waymon Budge MD on 02/09/2010   Method used:   [facility]        [facility] [address] [phone]* (retail)       [address]       [place], [place]  [address]       Ph: [phone] or [phone]       Fax: [phone]   RxID:   [id number] ZITHROMAX Z-PAK 250 MG TABS (AZITHROMYCIN) 2 today then one daily  #1 pak x 0   Entered and Authorized by:   Waymon Budge MD   Signed by:   Waymon Budge MD on 02/09/2010   Method used:   [facility]        [facility] [address] [address] [phone]* (retail)       [address]       [place], [place]  [address]       Ph: [phone] or [phone]       Fax: [phone]   RxID:   [id number]

## 2011-01-09 NOTE — Progress Notes (Signed)
Summary: appt  Phone Note Outgoing Call   Call placed by: Army Fossa CMA,  August 01, 2010 8:19 AM Summary of Call: Pt needs fasting OV with Dr. Army Fossa CMA  August 01, 2010 8:19 AM   Follow-up for Phone Call        Appointment scheduled for Friday 8/26.[name]  August 01, 2010 10:58 AM

## 2011-01-09 NOTE — Assessment & Plan Note (Signed)
Summary: FASTING OV PER DR.//KN   Vital Signs:  Patient profile:   70 year old female Weight:      199.13 pounds Pulse rate:   86 / minute Pulse rhythm:   regular BP sitting:   118 / 82  (left arm) Cuff size:   regular  Vitals Entered By: Army Fossa CMA (August 04, 2010 10:49 AM) CC: f/u on DM- Fasting   History of Present Illness: Diabetes--good medication compliance, ambulatory CBGs around 120  Hyperlipidemia--good medication compliance   breast cancer-- recurred 2011, note from oncology 6/11 reviewed doing XRT,  to do  [facility], they are planning to  order a bone density test   Osteopenia--see above   OSA--note from pulmonary revieweds, was seen 7-11, trying CPAP again, sleeping few hours a night      Current Medications (verified): 1)  Simvastatin 40 Mg Tabs (Simvastatin) .Marland Kitchen.. 1 By Mouth Qhs 2)  Glucophage 1000 Mg  Tabs (Metformin Hcl) .... Take 1 Tablet By Mouth Once Daily 3)  Singulair 10 Mg  Tabs (Montelukast Sodium) .Marland Kitchen.. 1 By Mouth Qd 4)  Proair Hfa 108 (90 Base) Mcg/act Aers (Albuterol Sulfate) .... 2 Puffs Four Times A Day As Needed 5)  Symbicort 160-4.5 Mcg/act  Aero (Budesonide-Formoterol Fumarate) .... Two Puffs Twice Daily - Due Office Visit Before Next Refill 6)  Prilosec Otc 20 Mg  Tbec (Omeprazole Magnesium) .... Take 1 Tablet By Mouth Once A Day As Needed 7)  Mucinex Dm 30-600 Mg  Tb12 (Dextromethorphan-Guaifenesin) .Marland Kitchen.. 1-2 Tabs By Mouth Every 12 Hours As Needed As Needed 8)  Centrum Silver   Tabs (Multiple Vitamins-Minerals) .... Take 1 Tablet By Mouth Once A Day 9)  Citracal + D 250-200 Mg-Unit  Tabs (Calcium Citrate-Vitamin D) .... 2 Once Daily 10)  One Touch Ultra Test Strips .... Checks Blood Sugar 1x/day, Dx 250.00 11)  Onetouch Ultrasoft Lancets   Misc (Lancets) .... Use As Directed 12)  Cetirizine Hcl 10 Mg Tabs (Cetirizine Hcl) .... Once Daily As Needed 13)  Cpap 7 Advanced  Allergies: 1)  ! * Bp Checks in Wrist Only 2)  Penicillin G  Potassium (Penicillin G Potassium)  Past History:  Past Medical History: Diabetes mellitus, type II Hyperlipidemia Asthma, Dx in adulthood (70y/o) breast cancer, hx of,  BILATERAL diagnosed in 2006, recurred 2011 Colonic polyps, hx of Osteopenia Ovarian Mass, resolved Allergic rhinitis OSA CPAP intolerant- NPSG 08/09/03- AHI 66/hr PERIPHERAL NEUROPATHY   OBESITY (ICD-278.00) negative stress test in 2004  Past Surgical History: Reviewed history from 01/20/2010 and no changes required. Appendectomy MASTECTOMY, BILATERAL, HX OF  ?pilonidal cyst surgery  Social History: Reviewed history from 01/20/2010 and no changes required. 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 Resp:  asthma symptoms improved, decreased wheezing and coughing, using rarely her inhaler. Psych:  despite recurrence of breast cancer, she is feeling well emotionally. Endo:  diet is very good, has not been able to exercise as much you to treatment for breast cancer.  Physical Exam  General:  alert and well-developed.   Lungs:  normal respiratory effort, no intercostal retractions, no accessory muscle use, and normal breath sounds.   Heart:  normal rate, regular rhythm, and no murmur.   Extremities:  no edema Psych:  Oriented X3, memory intact for recent  and remote, normally interactive, good eye contact, not anxious appearing, and not depressed appearing.  Oriented X3, memory intact for recent and remote, normally interactive, good eye contact, not anxious appearing, and not depressed appearing.     Impression & Recommendations:  Problem # 1:  HYPERLIPIDEMIA (ICD-272.4) at goal  Her updated medication list for this problem includes:    Simvastatin 40 Mg Tabs (Simvastatin) .Marland Kitchen... 1 by mouth qhs  Labs Reviewed: SGOT: 17  (01/20/2010)   SGPT: 17 (01/20/2010)   HDL:62.30 (01/20/2010), 48.8 (10/08/2008)  LDL:86 (01/20/2010), 98 (10/08/2008)  Chol:164 (01/20/2010), 166 (10/08/2008)  Trig:79.0 (01/20/2010), 94 (10/08/2008)  Problem # 2:  BREAST CANCER, HX OF (ICD-V10.3) recurrence 2011, note from oncology 6/11 reviewed had  XRT,  doing  FEMARA, they are planning to  order a bone density test  Problem # 3:  DIABETES MELLITUS, TYPE II (ICD-250.00)  labs  Her updated medication list for this problem includes:    Glucophage 1000 Mg Tabs (Metformin hcl) .Marland Kitchen... Take 1 tablet by mouth once daily  Orders: Venipuncture ([address]) TLB-A1C / Hgb A1C (Glycohemoglobin) (83036-A1C) Specimen Handling (60454)  Labs Reviewed: Creat: 0.6 (01/20/2010)    Reviewed HgBA1c results: 6.9 (01/20/2010)  6.6 (06/09/2009)  Her updated medication list for this problem includes:    Glucophage 1000 Mg Tabs (Metformin hcl) .Marland Kitchen... Take 1 tablet by mouth once daily  Problem # 4:  OBSTRUCTIVE SLEEP APNEA (ICD-327.23) trying to use her CPAP, this is still a challenge  Problem # 5:  OSTEOPENIA (ICD-733.90) now on Femara, oncology was planning to do a bone density test Her updated medication list for this problem includes:    Citracal + D 250-200 Mg-unit Tabs (Calcium citrate-vitamin d) .Marland Kitchen... 2 once daily    Problem # 6:  ASTHMA (ICD-493.90)  improved Her updated medication list for this problem includes:    Singulair 10 Mg Tabs (Montelukast sodium) .Marland Kitchen... 1 by mouth qd    Proair Hfa 108 (90 Base) Mcg/act Aers (Albuterol sulfate) .Marland Kitchen... 2 puffs four times a day as needed    Symbicort 160-4.5 Mcg/act Aero (Budesonide-formoterol fumarate) .Marland Kitchen..Marland Kitchen Two puffs twice daily - due office visit before next refill  Her updated medication list for this problem includes:    Singulair 10 Mg Tabs (Montelukast sodium) .Marland Kitchen... 1 by mouth qd    Proair Hfa 108 (90 Base) Mcg/act Aers (Albuterol sulfate) .Marland Kitchen... 2 puffs four times a day as needed    Symbicort  160-4.5 Mcg/act Aero (Budesonide-formoterol fumarate) .Marland Kitchen..Marland Kitchen Two puffs twice daily - due office visit before next refill  Problem # 7:  recommended flu shot today  Complete Medication List: 1)  Simvastatin 40 Mg Tabs (Simvastatin) .Marland Kitchen.. 1 by mouth qhs 2)  Glucophage 1000 Mg Tabs (Metformin hcl) .... Take 1 tablet by mouth once daily 3)  Singulair 10 Mg Tabs (Montelukast sodium) .Marland Kitchen.. 1 by mouth qd 4)  Proair Hfa 108 (90 Base) Mcg/act Aers (Albuterol sulfate) .... 2 puffs four times a day as needed 5)  Symbicort 160-4.5 Mcg/act Aero (Budesonide-formoterol fumarate) .... Two puffs twice daily - due office visit before next refill 6)  Prilosec Otc 20 Mg Tbec (Omeprazole magnesium) .... Take 1 tablet by mouth once a day as needed 7)  Mucinex Dm 30-600 Mg Tb12 (Dextromethorphan-guaifenesin) .Marland Kitchen.. 1-2 tabs by mouth every 12 hours as needed as needed 8)  Centrum Silver Tabs (Multiple vitamins-minerals) .... Take 1 tablet by mouth once a day 9)  Citracal + D 250-200 Mg-unit Tabs (Calcium citrate-vitamin d) .... 2  once daily 10)  One Touch Ultra Test Strips  .... Checks blood sugar 1x/day, dx 250.00 11)  Onetouch Ultrasoft Lancets Misc (Lancets) .... Use as directed 12)  Cetirizine Hcl 10 Mg Tabs (Cetirizine hcl) .... Once daily as needed 13)  Cpap 7 Advanced  14)  Femara 2.5 Mg Tabs (Letrozole) .Marland Kitchen.. 1 a day  Other Orders: Admin 1st Vaccine (41660) Flu Vaccine 7yrs + (63016)  Patient Instructions: 1)  Please schedule a follow-up appointment in 6 months .  Flu Vaccine Consent Questions     Do you have a history of severe allergic reactions to this vaccine? no    Any prior history of allergic reactions to egg and/or gelatin? no    Do you have a sensitivity to the preservative Thimersol? no    Do you have a past history of Guillan-Barre Syndrome? no    Do you currently have an acute febrile illness? no    Have you ever had a severe reaction to latex? no    Vaccine information given and explained to  patient? yes    Are you currently pregnant? no    Lot Number:[id number]   Exp Date:06/09/2011   Site Given  Right Deltoid IM 1)  Please schedule a follow-up appointment in 6 months .     .lbflu

## 2011-01-09 NOTE — Letter (Signed)
Summary: CMN for CPAP Supplies/[facility]  CMN for CPAP Supplies/[facility]   Imported By: Sherian Rein 07/17/2010 09:07:19  _____________________________________________________________________  External Attachment:    Type:   Image     Comment:   External Document

## 2011-01-09 NOTE — Assessment & Plan Note (Signed)
Summary: rov 2 months////kp   Primary Provider/Referring Provider:  Drue Novel  CC:  ROV 2 mos and pt has been diagnosed w/recurring left breat CA and is having surgery on May 10. 2010.  History of Present Illness:  February 09, 2010- Asthma, allergic rhinitis No major problems over the past year. 2 days ago she was driving with window open when she had sudden coughing with phlegm in mouth. Cough drop worked better than inhaler. Cough since then has been thick mucus plugs yellow green.Belches. Takes acid blocker intermittently. Has  been using her Symbicort once daily and Proair if needed. Dropped off Spiriva, thinking it might have caused the acute episode somehow. Took nothing today. occasionally wakes from sleep coughing. coughs after eating. Nose stays dry, thirsty. PFT 09/01/09 CXR- 8/10  Apr 10, 2010- Asthma, allergic rhinitis Has recurrent left breast cancer for surgery by Dr Johna Sheriff May 10- day surgery. She is aware of dyspnea after effort of watering flowers. Some pain across the strap muscles of her back- bone scan is pending. Blames pollen cough with yellow for some wheeze. Denies fever, blood or chest pain. Using cetirizine and Singulair. After antibiotic last visit she was using rescue inhaler more than once daily. Now uses it less than once daily.  Morning cough with thick phlegm after up and moving around.    Current Medications (verified): 1)  Simvastatin 40 Mg Tabs (Simvastatin) .Marland Kitchen.. 1 By Mouth Qhs 2)  Glucophage 1000 Mg  Tabs (Metformin Hcl) .... Take 1 Tablet By Mouth Once Daily 3)  Singulair 10 Mg  Tabs (Montelukast Sodium) .Marland Kitchen.. 1 By Mouth Qd 4)  Proair Hfa 108 (90 Base) Mcg/act Aers (Albuterol Sulfate) .... 2 Puffs Four Times A Day As Needed 5)  Symbicort 160-4.5 Mcg/act  Aero (Budesonide-Formoterol Fumarate) .... Two Puffs Twice Daily - Due Office Visit Before Next Refill 6)  Prilosec Otc 20 Mg  Tbec (Omeprazole Magnesium) .... Take 1 Tablet By Mouth Once A Day 7)  Mucinex Dm  30-600 Mg  Tb12 (Dextromethorphan-Guaifenesin) .Marland Kitchen.. 1-2 Tabs By Mouth Every 12 Hours As Needed As Needed 8)  Centrum Silver   Tabs (Multiple Vitamins-Minerals) .... Take 1 Tablet By Mouth Once A Day 9)  Citracal + D 250-200 Mg-Unit  Tabs (Calcium Citrate-Vitamin D) .... 2 Once Daily 10)  One Touch Ultra Test Strips .... As Directed 11)  Onetouch Ultrasoft Lancets   Misc (Lancets) .... Use As Directed 12)  Zithromax Z-Pak 250 Mg Tabs (Azithromycin) .... 2 Today Then One Daily 13)  Vicodin 5-500 Mg Tabs (Hydrocodone-Acetaminophen) .Marland Kitchen.. 1-2 Tabs Every 6 Hours As Needed For Pain 14)  Cyclobenzaprine Hcl 10 Mg  Tabs (Cyclobenzaprine Hcl) .Marland Kitchen.. 1 By Mouth 2 Times Daily As Needed For Pain 15)  Cetirizine Hcl 10 Mg Tabs (Cetirizine Hcl) .... Once Daily As Needed  Allergies (verified): 1)  ! * Bp Checks in Wrist Only 2)  Penicillin G Potassium (Penicillin G Potassium)  Past History:  Past Medical History: Last updated: 04/07/2010 Diabetes mellitus, type II Hyperlipidemia Asthma, Dx in adulthood (70y/o) breast cancer, hx of,  BILATERAL diagnosed in 2006, recurred 2011 Colonic polyps, hx of Osteopenia Ovarian Mass, resolved Allergic rhinitis OSA CPAP intolerant- NPSG 08/09/03- AHI 66/hr PERIPHERAL NEUROPATHY   OBESITY (ICD-278.00) negative stress test in 2004  Past Surgical History: Last updated: 01/20/2010 Appendectomy MASTECTOMY, BILATERAL, HX OF  ?pilonidal cyst surgery  Family History: Last updated: 01/20/2010 breast Cancer-- GM, sister colon cancer--no diabetes-- B . 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    Vital Signs:  Patient profile:   70 year old female Height:      64 inches Weight:      207.8 pounds O2 Sat:      98 % on room air Pulse rate:   85 / minute BP sitting:   138 / 70  (right arm) Cuff size:   regular  Vitals Entered By: Denna Haggard, CMA (Apr 10, 2010 2:58 PM)  O2 Sat at Rest %:  98 O2 Flow:  room air CC: ROV 2 mos, pt has been diagnosed w/recurring left breat CA and is having surgery on May 10. 2010    Physical Exam  Additional Exam:  General: A/Ox3; pleasant and cooperative, NAD, SKIN: no rash, lesions NODES: no lymphadenopathy HEENT: [place]/AT, EOM- WNL, Conjuctivae- clear, PERRLA, TM-WNL, Nose- clear, Throat- clear and wnl, Mallampati  II NECK: Supple w/ fair ROM, JVD- none, normal carotid impulses w/o bruits Thyroid- CHEST: wheeze in bases with deep breath, decreased breath sounds HEART: RRR, no m/g/r heard ABDOMEN: Soft and nl;  ZOX:WRUE, nl pulses, no edema  NEURO: Grossly intact to observation      Impression & Recommendations:  Problem # 1:  OBSTRUCTIVE SLEEP APNEA (ICD-327.23)  Had dx sleep apnea. She returned CPAP because it increased her cough, but never had f/u on the isue with  Dr Jayme Cloud, who left town. She is widowed with no reports of snoring. I discussed surgical issues with her. We will try to get her autotitrated.  Orders: DME Referral (DME)  Problem # 2:  ASTHMA (ICD-493.90) She has some chronic wheeze. I will ask her to increase her symbicort from one to two times daily now preop.  Medications Added to Medication List This Visit: 1)  Cetirizine Hcl 10 Mg Tabs (Cetirizine hcl) .... Once daily as needed  Other Orders: Est. Patient Level III (45409)  Patient Instructions: 1)  Please schedule a follow-up appointment in 2 months. 2)  See [facility] to set up CPAP  autotitration 3)  Suggest you use your Symbicort 160/4.5, 2 puffs and rinse mouth twice every day ahead of surgery.  Sample.

## 2011-01-09 NOTE — Progress Notes (Signed)
Summary: Refill Clarification  Phone Note From Pharmacy   Caller[facility]  [address] [phone]* Call For: Nurse  Summary of Call: They send back the prescription for the One Touch Ultra Strips stating they need more directions on how often. Rx stated, use as directed.  Initial call taken by: Harold Barban,  Apr 12, 2010 10:45 AM    New/Updated Medications: * ONE TOUCH ULTRA TEST STRIPS checks blood sugar 1x/day, dx 250.00 Prescriptions: ONE TOUCH ULTRA TEST STRIPS checks blood sugar 1x/day, dx 250.00  #100 x 3   Entered by:   Shary Decamp   Authorized by:   Nolon Rod. Paz MD   Signed by:   Shary Decamp on 04/12/2010   Method used:   [facility]ed [facility] ...       [facility]  [address] [phone]* (retail)       [address]       [place], [place]  [id number]       Ph: [phone] or [phone]       Fax: [phone]   RxID:   [id number]

## 2011-01-09 NOTE — Letter (Signed)
Summary: CMN for CPAP Supplies/[facility]  CMN for CPAP Supplies/[facility]   Imported By: Sherian Rein 06/14/2010 13:39:53  _____________________________________________________________________  External Attachment:    Type:   Image     Comment:   External Document

## 2011-01-09 NOTE — Progress Notes (Signed)
Summary: refill  Message from:  Fax from Pharmacy on [address] fax [phone]  Refills Requested: Medication #1:  SYMBICORT 160-4.5 MCG/ACT  AERO Two puffs twice daily Initial call taken by: Barb Merino,  January 13, 2010 12:26 PM    New/Updated Medications: SYMBICORT 160-4.5 MCG/ACT  AERO (BUDESONIDE-FORMOTEROL FUMARATE) Two puffs twice daily - DUE OFFICE VISIT BEFORE NEXT REFILL Prescriptions: SYMBICORT 160-4.5 MCG/ACT  AERO (BUDESONIDE-FORMOTEROL FUMARATE) Two puffs twice daily - DUE OFFICE VISIT BEFORE NEXT REFILL  #1 x 0   Entered by:   Shary Decamp   Authorized by:   Nolon Rod. Paz MD   Signed by:   Shary Decamp on 01/13/2010   Method used:   [facility] [phone][facility] (retail[facility]       [address]       [place], [place]  [id number]       Ph: [phone] or [phone]       Fax: [phone]   RxID:   [id number]

## 2011-01-09 NOTE — Assessment & Plan Note (Signed)
Summary: recheck spot on face/cbs   Vital Signs:  Patient profile:   70 year old female Height:      64 inches (162.56 cm) Weight:      205.25 pounds (93.30 kg) BMI:     35.36 O2 Sat:      96 % on Room air Temp:     98.3 degrees F (36.83 degrees C) oral Pulse rate:   87 / minute BP sitting:   134 / 80  (left arm)  Vitals Entered By: Lucious Groves CMA (October 25, 2010 3:56 PM)  O2 Flow:  Room air CC: Re-check spot on face./kb Is Patient Diabetic? Yes Pain Assessment Patient in pain? no      Comments Patient notes that the area itches but is not painful. She has noticed evidence of bleeding 2x. She has also noticed 2 additional areas appeared 3 weeks ago. Patient denies pain, discharge, and streaks or signs of infection. Patient finished ABX given, but still has the cream./kb   History of Present Illness: followup from last office visit, no better She did take the medicines as prescribed  Current Medications (verified): 1)  Simvastatin 40 Mg Tabs (Simvastatin) .Marland Kitchen.. 1 By Mouth  Every Morning 2)  Glucophage 1000 Mg  Tabs (Metformin Hcl) .... Take 1 Tablet By Mouth Once Daily 3)  Singulair 10 Mg  Tabs (Montelukast Sodium) .Marland Kitchen.. 1 By Mouth Qd 4)  Proair Hfa 108 (90 Base) Mcg/act Aers (Albuterol Sulfate) .... 2 Puffs Four Times A Day As Needed 5)  Prilosec Otc 20 Mg  Tbec (Omeprazole Magnesium) .... Take 1 Tablet By Mouth Once A Day As Needed 6)  Mucinex Dm 30-600 Mg  Tb12 (Dextromethorphan-Guaifenesin) .Marland Kitchen.. 1-2 Tabs By Mouth Every 12 Hours As Needed As Needed 7)  Centrum Silver   Tabs (Multiple Vitamins-Minerals) .... Take 1 Tablet By Mouth Once A Day 8)  Citracal + D 250-200 Mg-Unit  Tabs (Calcium Citrate-Vitamin D) .... 2 Once Daily 9)  One Touch Ultra Test Strips .... Checks Blood Sugar 1x/day, Dx 250.00 10)  Onetouch Ultrasoft Lancets   Misc (Lancets) .... Use As Directed 11)  Cetirizine Hcl 10 Mg Tabs (Cetirizine Hcl) .... Once Daily As Needed 12)  Femara 2.5 Mg Tabs  (Letrozole) .Marland Kitchen.. 1 A Day 13)  Zovirax 5 % Oint (Acyclovir) .... Apply 5 Times A Day For 5 Days  Allergies (verified): 1)  ! * Bp Checks in Wrist Only 2)  Penicillin G Potassium (Penicillin G Potassium)  Past History:  Past Medical History: Reviewed history from 08/17/2010 and no changes required. Diabetes mellitus, type II Hyperlipidemia Asthma, Dx in adulthood (70y/o)                    - PFT 09/08/09 FEV1 1.70/81%; FEV1/FVC 0.75; + resp to dilator; Nl LV/ DLCO breast cancer, hx of,  BILATERAL diagnosed in 2006, recurred 2011- Dr Donnie Coffin                     Drue Flirt, XRT, Femara Colonic polyps, hx of Osteopenia Ovarian Mass, resolved Allergic rhinitis OSA CPAP intolerant- NPSG 08/09/03- AHI 66/hr PERIPHERAL NEUROPATHY   OBESITY (ICD-278.00) negative stress test in 2004  Past Surgical History: Reviewed history from 01/20/2010 and no changes required. Appendectomy MASTECTOMY, BILATERAL, HX OF  ?pilonidal cyst surgery  Social History: Reviewed history from 01/20/2010 and no changes required. 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   Skin:  at the left side of the face by the jaw angle she has a 1.5 cm skin lesion, at this time the lesion is slightly raised , hyperchromic, slightly harder than the rest of the skin. 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 On today's examination, the skin looks different from previous, seems indurated and peau d'orange  I  like a dermatologist look at this lesion.--- referral arranged Advised patient not to put any other ointment or  cream on it.  Orders: Dermatology Referral (Derma)  Complete Medication List: 1)  Simvastatin 40 Mg Tabs (Simvastatin) .Marland Kitchen.. 1 by mouth  every morning 2)  Glucophage 1000 Mg Tabs (Metformin hcl) .... Take 1 tablet by mouth once daily 3)  Singulair 10 Mg Tabs (Montelukast sodium) .Marland Kitchen.. 1 by mouth qd 4)  Proair Hfa 108 (90 Base) Mcg/act Aers (Albuterol sulfate) .... 2 puffs four times a day as needed 5)  Prilosec Otc 20 Mg Tbec (Omeprazole magnesium) .... Take 1 tablet by mouth once a day as needed 6)  Mucinex Dm 30-600 Mg Tb12 (Dextromethorphan-guaifenesin) .Marland Kitchen.. 1-2 tabs by mouth every 12 hours as needed as needed 7)  Centrum Silver Tabs (Multiple vitamins-minerals) .... Take 1 tablet by mouth once a day 8)  Citracal + D 250-200 Mg-unit Tabs (Calcium citrate-vitamin d) .... 2 once daily 9)  One Touch Ultra Test Strips  .... Checks blood sugar 1x/day, dx 250.00 10)  Onetouch Ultrasoft Lancets Misc (Lancets) .... Use as directed 11)  Cetirizine Hcl 10 Mg Tabs (Cetirizine hcl) .... Once daily as needed 12)  Femara 2.5 Mg Tabs (Letrozole) .Marland Kitchen.. 1 a day 13)  Zovirax 5 % Oint (Acyclovir) .... Apply 5 times a day for 5 days   Orders Added: 1)  Est. Patient Level II [41324] 2)  Dermatology Referral [Derma]

## 2011-01-11 ENCOUNTER — Encounter: Payer: Self-pay | Admitting: Internal Medicine

## 2011-01-11 NOTE — Assessment & Plan Note (Signed)
Summary: 3 months f/u ///kp   Primary Provider/Referring Provider:  Drue Novel  CC:  3 month follow up visit-OSA-stopped using CPAP; Asthma-SOB and wheezing with cough-prodcutive clear to yellow and thick at times..  History of Present Illness: May 15, 2010 -OSA Oxygen sat dropped too much on home ONOX with oral appliance instead of CPAP. Reviewed with her She spent over 3 hours with sat les than 89%. The newest mask stays on, but pulls uncomfortably. She is going to take advantage of a CPAP open house at [facility] to discuss her pressure and mask fit.   Husband recently died of leukemia and heart failure in May. She treated a chest cold otc and is resolving now.  August 17, 2010- OSA, Asthma, Hx breast CA  Trying still to get used to CPAP. Trying to use it at least 4 hours/ night. Pressure is at 7/ [facility]. She still finds it unpleasant/ uncomfortable and prevents sleep.  Asthma is much improved. Has used her rescue inhaler only 3 times since last here. Not using Symbicort at all. Had flu vax 2 weeks ago. Completed XRT for her recurrent breast cancer about 2 months ago w/ f/u planned.  November 22, 2010  OSA, Asthma, Hx breast CA Nurse-CC:  OSA, Asthma, Hx breast CA We [facility] to [facility] for help with CPAP desensitization and they retitrated to 7. Not able to use CPAP. Felt better w/o when went on vacation. Strap overlies a spot on face- seeing dermatiologist ? herpetic. Was taking mask off in sleep.  Asthma is worse. Had home thoroughly cleaned. More wheeze since rain 3 weeks ago. 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 Used rescue inhaler as we were talking. Thick white/ yellow.     Asthma History    Asthma Control Assessment:    Age range: 12+ years    Symptoms: 0-2 days/week    Nighttime Awakenings: 0-2/month    Interferes w/ normal activity: no limitations    SABA use (not for EIB): several times per day    Asthma Control Assessment: Very Poorly  Controlled   Preventive Screening-Counseling & Management  Alcohol-Tobacco     Smoking Status: quit     Year Quit: 42 years ago     Pack years: 1ppd x 40 yrs.  Comments: Need to get correct information  Current Medications (verified): 1)  Simvastatin 40 Mg Tabs (Simvastatin) .Marland Kitchen.. 1 By Mouth  Every Morning 2)  Glucophage 1000 Mg  Tabs (Metformin Hcl) .... Take 1 Tablet By Mouth Once Daily 3)  Singulair 10 Mg  Tabs (Montelukast Sodium) .Marland Kitchen.. 1 By Mouth Qd 4)  Proair Hfa 108 (90 Base) Mcg/act Aers (Albuterol Sulfate) .... 2 Puffs Four Times A Day As Needed 5)  Prilosec Otc 20 Mg  Tbec (Omeprazole Magnesium) .... Take 1 Tablet By Mouth Once A Day As Needed 6)  Mucinex Dm 30-600 Mg  Tb12 (Dextromethorphan-Guaifenesin) .Marland Kitchen.. 1-2 Tabs By Mouth Every 12 Hours As Needed As Needed 7)  Centrum Silver   Tabs (Multiple Vitamins-Minerals) .... Take 1 Tablet By Mouth Once A Day 8)  Citracal + D 250-200 Mg-Unit  Tabs (Calcium Citrate-Vitamin D) .... 2 Once Daily 9)  One Touch Ultra Test Strips .... Checks Blood Sugar 1x/day, Dx 250.00 10)  Onetouch Ultrasoft Lancets   Misc (Lancets) .... Use As Directed 11)  Cetirizine Hcl 10 Mg Tabs (Cetirizine Hcl) .... Once Daily As Needed 12)  Topicort 0.25 % Crea (Desoximetasone) .... Use As Directed  Allergies (verified): 1)  ! * Bp Checks in Wrist Only 2)  Penicillin G Potassium (Penicillin G Potassium)  Past History:  Past Medical History: Last updated: 08/17/2010 Diabetes mellitus, type II Hyperlipidemia Asthma, Dx in adulthood (70y/o)                    - PFT 09/08/09 FEV1 1.70/81%; FEV1/FVC 0.75; + resp to dilator; Nl LV/ DLCO breast cancer, hx of,  BILATERAL diagnosed in 2006, recurred 2011- Dr Donnie Coffin                     Drue Flirt, XRT, Femara Colonic polyps, hx of Osteopenia Ovarian Mass, resolved Allergic rhinitis OSA CPAP intolerant- NPSG 08/09/03- AHI 66/hr PERIPHERAL NEUROPATHY   OBESITY (ICD-278.00) negative stress test in 2004  Past  Surgical History: Last updated: 01/20/2010 Appendectomy MASTECTOMY, BILATERAL, HX OF  ?pilonidal cyst surgery  Family History: Last updated: 01/20/2010 breast Cancer-- GM, sister colon cancer--no diabetes-- B . 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  The patient denies shortness of breath at rest, non-productive cough, coughing up blood, chest pain, irregular heartbeats, acid heartburn, indigestion, loss of appetite, weight change, abdominal pain, difficulty swallowing, sore throat, tooth/dental problems, headaches, nasal congestion/difficulty breathing through nose, and sneezing.    Vital Signs:  Patient profile:   70 year old female Height:      64 inches Weight:      206.13 pounds BMI:     35.51 O2 Sat:      97 % on Room air Pulse rate:   92 / minute BP sitting:   130 / 88  (left arm) Cuff size:   regular  Vitals Entered By: Reynaldo Minium CMA (November 22, 2010 9:04 AM)  O2 Flow:  Room air CC: 3 month follow up visit-OSA-stopped using CPAP; Asthma-SOB and wheezing with cough-prodcutive clear to yellow and thick at times.   Physical Exam  Additional Exam:  General: A/Ox3; pleasant and cooperative, NAD, overweight SKIN: no rash, lesions NODES: no lymphadenopathy HEENT: [place]/AT, EOM- WNL, Conjuctivae- clear, PERRLA, TM-WNL, , Throat- clear and wnl, Mallampati  II-III NECK: Supple w/ fair ROM, JVD- none, normal carotid impulses w/o bruits Thyroid- CHEST: Active wheezey cough HEART: RRR, no m/g/r heard ABDOMEN: Overweight RSW:NIOE, nl pulses, no edema  NEURO: Grossly intact to  observation      Impression & Recommendations:  Problem # 1:  OBSTRUCTIVE SLEEP APNEA (ICD-327.23)  She is not able to use CPAP consisitently and we are going to have to turn it in and reconsider when her asthma control is better.   Problem # 2:  ASTHMA (ICD-493.90) Exacerbation that doesn't seem to be infection related.   For now we will give neb, depo with steroid talk, sample trial of Advair .  PFT in 2010 reviewed- only mild obstructive airways disease despite significant remote smoking hx.   Problem # 3:  ALLERGIC RHINITIS (ICD-477.9) Denies significant nasal congestion, but noting some increased postnasal drip over the same time that she has had more wheeze. We discussed management of her  rhinitis. Her updated medication list for this problem includes:    Cetirizine Hcl 10 Mg Tabs (Cetirizine hcl) ..... Once daily as needed  Medications Added to Medication List This Visit: 1)  Topicort 0.25 % Crea (Desoximetasone) .... Use as directed  Other Orders: Est. Patient Level IV (54098) DME Referral (DME) Depo- Medrol 80mg  (J1040) Admin of Therapeutic Inj  intramuscular or subcutaneous (11914)  Patient Instructions: 1)  Please schedule a follow-up appointment in 1 month. 2)  neb xop 1.25 3)  depo 80 4)  sample Advair 100/50 5)    1 puff and rinse mouth, twice daily- use this till used up. 6)  sample rescue inhaler  7)  We will contact [facility] to dc CPAP for now.      Medication Administration  Injection # 1:    Medication: Depo- Medrol 80mg     Diagnosis: ASTHMA (ICD-493.90)    Route: IM    Site: LUOQ gluteus    Exp Date: 04/2013    Lot #: [id number]    Mfr: [employer]    Patient tolerated injection without complications    Given by: Zackery Barefoot CMA (November 22, 2010 10:20 AM)  Orders Added: 1)  Est. Patient Level IV [78295] 2)  DME Referral [DME] 3)  Depo- Medrol 80mg  [J1040] 4)  Admin of Therapeutic Inj  intramuscular or subcutaneous [62130]

## 2011-01-11 NOTE — Assessment & Plan Note (Signed)
Summary: BOTTOM LIP SWOLLEN/RH........Marland Kitchen   Vital Signs:  Patient profile:   70 year old female Weight:      203 pounds O2 Sat:      94 % on Room air Temp:     98.6 degrees F oral Pulse rate:   90 / minute BP sitting:   124 / 84  (left arm)  Vitals Entered By: Doristine Devoid CMA (November 27, 2010 9:45 AM)  O2 Flow:  Room air CC: swellen on bottom lip and noticed red patches on arm and legs very itchy   History of Present Illness: 70 yo woman here today for lip swelling and hives.  hives appeared on Friday after seeing Pulmonary.  lip swelling started last night.  switched soap 2 weeks ago w/out rxn, restarted Advair on Friday (has taken before w/out difficulty), had Depo-medrol injxn (1st one).  no new laundry detergents, foods.  has not taken anything for swelling or itching.  hives start as small, raised itchy areas and then spread dramatically.  Current Medications (verified): 1)  Simvastatin 40 Mg Tabs (Simvastatin) .Marland Kitchen.. 1 By Mouth  Every Morning 2)  Glucophage 1000 Mg  Tabs (Metformin Hcl) .... Take 1 Tablet By Mouth Once Daily 3)  Singulair 10 Mg  Tabs (Montelukast Sodium) .Marland Kitchen.. 1 By Mouth Qd 4)  Proair Hfa 108 (90 Base) Mcg/act Aers (Albuterol Sulfate) .... 2 Puffs Four Times A Day As Needed 5)  Prilosec Otc 20 Mg  Tbec (Omeprazole Magnesium) .... Take 1 Tablet By Mouth Once A Day As Needed 6)  Mucinex Dm 30-600 Mg  Tb12 (Dextromethorphan-Guaifenesin) .Marland Kitchen.. 1-2 Tabs By Mouth Every 12 Hours As Needed As Needed 7)  Centrum Silver   Tabs (Multiple Vitamins-Minerals) .... Take 1 Tablet By Mouth Once A Day 8)  Citracal + D 250-200 Mg-Unit  Tabs (Calcium Citrate-Vitamin D) .... 2 Once Daily 9)  One Touch Ultra Test Strips .... Checks Blood Sugar 1x/day, Dx 250.00 10)  Onetouch Ultrasoft Lancets   Misc (Lancets) .... Use As Directed 11)  Cetirizine Hcl 10 Mg Tabs (Cetirizine Hcl) .... Once Daily As Needed 12)  Topicort 0.25 % Crea (Desoximetasone) .... Use As Directed  Allergies  (verified): 1)  ! * Bp Checks in Wrist Only 2)  Penicillin G Potassium (Penicillin G Potassium)  Review of Systems      See HPI  Physical Exam  General:  alert and well-developed.   Mouth:  lower lip is dramatically swollen no enlargement of tongue Lungs:  normal respiratory effort, no intercostal retractions, no accessory muscle use, and normal breath sounds.   Heart:  normal rate, regular rhythm, and no murmur.   Skin:  small hive on L forearm large hive encompassing almost entire upper R arm, R inner thigh, R posterior thigh, and under breasts bilaterally Cervical Nodes:  No lymphadenopathy noted   Impression & Recommendations:  Problem # 1:  ALLERGIC REACTION, ACUTE (ICD-995.3) Assessment New  pt having dramatic allergic reaction to unknown trigger.  given that she had hives after 1st Depo-Medrol injxn will not repeat steroid injxn in office but rather start oral prednisone.  discussed that she was not allergic to the steroid component but possibly a preservative in the injxn.  will notify pulm that she had problems at last week's visit.  discussed red flags that should prompt immediate return.  Pt expresses understanding and is in agreement w/ this plan.  Orders: Prescription Created Electronically [phone])  Complete Medication List: 1)  Simvastatin 40 Mg Tabs (Simvastatin) .Marland KitchenMarland KitchenMarland Kitchen  1 by mouth  every morning 2)  Glucophage 1000 Mg Tabs (Metformin hcl) .... Take 1 tablet by mouth once daily 3)  Singulair 10 Mg Tabs (Montelukast sodium) .Marland Kitchen.. 1 by mouth qd 4)  Proair Hfa 108 (90 Base) Mcg/act Aers (Albuterol sulfate) .... 2 puffs four times a day as needed 5)  Prilosec Otc 20 Mg Tbec (Omeprazole magnesium) .... Take 1 tablet by mouth once a day as needed 6)  Mucinex Dm 30-600 Mg Tb12 (Dextromethorphan-guaifenesin) .Marland Kitchen.. 1-2 tabs by mouth every 12 hours as needed as needed 7)  Centrum Silver Tabs (Multiple vitamins-minerals) .... Take 1 tablet by mouth once a day 8)  Citracal + D  250-200 Mg-unit Tabs (Calcium citrate-vitamin d) .... 2 once daily 9)  One Touch Ultra Test Strips  .... Checks blood sugar 1x/day, dx 250.00 10)  Onetouch Ultrasoft Lancets Misc (Lancets) .... Use as directed 11)  Cetirizine Hcl 10 Mg Tabs (Cetirizine hcl) .... Once daily as needed 12)  Topicort 0.25 % Crea (Desoximetasone) .... Use as directed 13)  Prednisone 20 Mg Tabs (Prednisone) .... 2 tabs daily x 7 days  Patient Instructions: 1)  You are having an allergic reaction of some sort 2)  Take the Prednisone today as soon as you get home 3)  Take Benadryl as needed for the itching 4)  The steroids will elevate your sugars temporarily- please be aware of what you are eating 5)  If you have swelling of your tongue or find it difficult to swallow or breathe you MUST GO TO THE ER 6)  Call with any questions or concerns 7)  Hang in there! Prescriptions: PREDNISONE 20 MG TABS (PREDNISONE) 2 tabs daily x 7 days  #14 x 0   Entered and Authorized by:   Neena Rhymes MD   Signed by:   Neena Rhymes MD on 11/27/2010   Method used:   [facility]        [facility]  [address] [phone]* (retail)       [address]       [place], [place]  [address]       Ph: [phone] or [phone]       Fax: [phone]   RxID:   [id number]    Orders Added: 1)  Est. Patient Level III [25956] 2)  Prescription Created Electronically [phone]

## 2011-01-11 NOTE — Progress Notes (Signed)
Summary: FYI---pt admitted to hospital  Phone Note Call from Patient Call back at Work Phone [phone]   Caller: Patient Summary of Call: patient called to cancel appt this afternoon due to blood in stool--decided to go to ER instead and they admitted her to [facility] called to let Dr Drue Novel know Initial call taken by: Jerolyn Shin,  December 27, 2010 11:35 AM  Follow-up for Phone Call        thnak you, H&P reviewed, admited w/ Diarrhea with positive stool guaiac and mild anemia. GI was consulted Follow-up by: Lyberti Thrush E. Charleene Callegari MD,  December 27, 2010 8:06 PM

## 2011-01-12 ENCOUNTER — Telehealth (INDEPENDENT_AMBULATORY_CARE_PROVIDER_SITE_OTHER): Payer: Self-pay | Admitting: *Deleted

## 2011-01-12 NOTE — Miscellaneous (Signed)
Summary: CPAP Bi-Level Set Up Info/[facility]  CPAP Bi-Level Set Up Info/[facility]   Imported By: Sherian Rein 05/05/2010 13:51:29  _____________________________________________________________________  External Attachment:    Type:   Image     Comment:   External Document

## 2011-01-25 ENCOUNTER — Encounter (INDEPENDENT_AMBULATORY_CARE_PROVIDER_SITE_OTHER): Payer: Self-pay | Admitting: *Deleted

## 2011-01-29 ENCOUNTER — Encounter: Payer: Self-pay | Admitting: Internal Medicine

## 2011-01-31 NOTE — Letter (Signed)
Summary: Primary Care Appointment Letter  [facility]  [address] [place], [place] [address]   Phone: [phone]  Fax: [phone]    01/25/2011 MRN: [id number]  [name] [address] [place], [place]  [address]  Dear [name],   Your Primary Care Physician Hazard E. Paz MD has indicated that:    [name]      It is time to schedule an appointment for a regular followup appointment.    _______you missed your appointment on______ and need to call and          reschedule.    _______you need to have lab work done.    _______you need to schedule an appointment discuss lab or test results.    _______you need to call to reschedule your appointment that is                       scheduled on _________.     Please call our office as soon as possible. Our phone number is [phone]. Our office is open 8a-12noon and 1p-5p, Monday through Friday.     Thank you,    [facility]

## 2011-01-31 NOTE — Letter (Signed)
Summary: anal fissure, constipation--[facility]   [facility]   Imported By: Maryln Gottron 01/23/2011 09:49:04  _____________________________________________________________________  External Attachment:    Type:   Image     Comment:   External Document

## 2011-02-04 NOTE — H&P (Signed)
[name], CHISOM NO.:  [id number]  MEDICAL RECORD NO.:  [id number]          PATIENT TYPE:  EMS  LOCATION:  MAJO                         FACILITY:  [facility]  PHYSICIAN:  Richarda Overlie, MD       DATE OF BIRTH:  06-20-1941  DATE OF ADMISSION:  12/26/2010 DATE OF DISCHARGE:                             HISTORY & PHYSICAL   PRIMARY CARE PHYSICIAN:  Dr. [name].  PRIMARY SURGEON:  Dr. Glenna Fellows.  PRIMARY GYNECOLOGIST:  Dr. Dierdre Forth.  PRIMARY PULMONOLOGIST:  Dr. Jetty Duhamel.  PRIMARY ONCOLOGIST:  Dr. Donnie Coffin.  SUBJECTIVE:  This is a 70 year old female with a history of chronic constipation, history of breast cancer, and asthma who presents to the ED with a chief complaint of diarrhea.  The patient was a very poor historian and changed the story several times during this interview. Apparently the patient has been having symptoms for the last 15-16 days. She recalls that she was on prednisone towards the end of December for possible asthma and possibly a skin lesion.  She was also given hydrocortisone shot for a lesion on her face and apparently had an allergic reaction to it following, which she received another course of prednisone.  The patient states that her bowels have not been regular for at least 13 years, and she has been taking multiple laxatives over-the-counter to help her move.  The beginning of January when she was constipated for about 4 or 5 days, she started taking a liquid laxative that sounds like magnesium citrate and started developing diarrhea, described as several bowel movements a day.  The bowel movements are described as brown stools with a tinge of bright red blood mixed in with the stool.  There is no documented history of melena or frank GI bleeding.  The patient also denies any fever, nausea, vomiting or abdominal pain.  She describes 4 to 5 bowel movements a day.  She states that she had so much diarrhea and so  many times and the course has been so prolonged that she started developing rectal pain.  Because of the rectal pain, she started taking ibuprofen at least 400 mg tablets on a daily basis for about 5 days.  She denies any history of peptic ulcer disease and denies any recent history of EGD.  She states that she had a virtual colonoscopy done by Dr. Madilyn Fireman 6 months ago, and a colonoscopy in 2005 revealed an adenomatous polyp.  PAST MEDICAL HISTORY:  Diabetes mellitus type 2, dyslipidemia, asthma, history of bilateral breast cancer diagnosed in 2006 with recurrence in 2011 treated by Dr. Donnie Coffin, status post radiation  , history of colon polyps, osteopenia, ovarian mass now resolved, allergic rhinitis, obstructive sleep apnea with last nocturnal polysomnography in 2004, peripheral neuropathy, morbid obesity, and negative stress test in 2004.  ALLERGIES:  PENICILLIN G.  PAST SURGICAL HISTORY:  Appendectomy, bilateral mastectomy, history of pilonidal cyst, history of recent resection of her recurrence of her breast cancer, colonoscopy in 2005.  FAMILY HISTORY:  Breast cancer in her grandmother and her sister.  Colon cancer in her aunt.  Mother had COPD.  SOCIAL HISTORY:  The patient is  widowed, lives alone and is a retired Runner, broadcasting/film/video.  She has 2 children and is expecting twin grandchildren this weekend.  She stopped smoking at the age of 53.  She smokes 1 pack a day for about 5 years.  Denies use of alcohol.  Does not exercise regularly.  REVIEW OF SYSTEMS:  Complete review of systems was done as documented in HPI.  HOME MEDICATIONS:  Simvastatin 40 mg p.o. daily, Glucophage, Singulair, ProAir, Prilosec, Mucinex, Centrum Silver, Citracal, one-touch ultra strips, cetirizine, and Topicort.  PHYSICAL EXAMINATION:  VITAL SIGNS:  Blood pressure 122/72, pulse 73, respirations 20, temperature 99.2. GENERAL:  The patient is currently comfortable in no acute cardiopulmonary distress. HEENT:   Pupils equal and reactive.  Extraocular movements intact. NECK:  Supple.  No JVD. LUNGS:  Clear to auscultation. CARDIOVASCULAR:  Regular rate and rhythm.  No murmurs, rubs or gallops. ABDOMEN:  Obese, soft, nontender, nondistended. EXTREMITIES:  Without cyanosis, clubbing or edema. NEUROLOGIC:  Cranial nerves II-XII grossly intact. PSYCHIATRIC:  Intermittent confusion but redirected easily. LYMPHATIC:  No axillary, inguinal, cervical lymphadenopathy.  LABS:  Fecal occult blood is positive.  WBC 10.0, hemoglobin 11.6, hematocrit 35.2 and platelet count of 347.  Sodium 136, potassium 3.8, chloride 101, bicarbonate 26, glucose 114, BUN 10, creatinine 0.69, calcium 9.3, albumin 3.5, AST 26, ALT 17, alkaline phosphatase 50, glucose is 118.  Abdominal KUB is pending.  ASSESSMENT/PLAN: 1. Diarrhea with positive stool guaiac and mild anemia.  The patient     has been taking ibuprofen and prednisone, and it is possible that     the source of her bleeding is upper GI.  Will check stool guaiacs     x3, as it is not evidently clear whether the patient has frank     melena or not.  She has had a recent virtual colonoscopy.  Given     the history of rectal pain,  I feel that the patient might benefit     from a flexible sigmoidoscopy.  Dr. Dulce Sellar has already been     notified by the ER physician.  She will be seen by [facility] in the     morning.  In the meantime if the patient's hemoglobin drops     significantly, she will be transfused as needed.  We will check a     PT/INR. 2. History of asthma, currently appears to be stable.  The patient     just completed a course of prednisone.  She does not have any upper     respiratory infection like symptoms to warrant a chest x-ray.  Will     continue her on her Singulair and DuoNeb treatments. 3. Allergic rhinitis.  Continue the patient on Singulair. 4. Dyslipidemia.  Continue the patient on simvastatin. 5. Diarrhea.  Check a stool for Clostridium  difficile, stool ova and     parasites, culture and sensitivity.  Start the     patient on IV hydration.  Because of the patient's history of     bleeding, the patient will be treated with sequential compression     devices for deep venous thrombosis prophylaxis.  CODE STATUS:  She is a full code.     Richarda Overlie, MD     NA/MEDQ  D:  12/26/2010  T:  12/27/2010  Job:  [id number]  Electronically Signed by Richarda Overlie MD on 02/04/2011 07:33:56 AM

## 2011-02-05 ENCOUNTER — Other Ambulatory Visit: Payer: Self-pay | Admitting: Internal Medicine

## 2011-02-05 ENCOUNTER — Ambulatory Visit (INDEPENDENT_AMBULATORY_CARE_PROVIDER_SITE_OTHER): Payer: Medicare Other | Admitting: Internal Medicine

## 2011-02-05 ENCOUNTER — Encounter: Payer: Self-pay | Admitting: Internal Medicine

## 2011-02-05 DIAGNOSIS — E119 Type 2 diabetes mellitus without complications: Secondary | ICD-10-CM

## 2011-02-05 DIAGNOSIS — K5902 Outlet dysfunction constipation: Secondary | ICD-10-CM | POA: Insufficient documentation

## 2011-02-05 DIAGNOSIS — K5909 Other constipation: Secondary | ICD-10-CM

## 2011-02-05 LAB — CBC WITH DIFFERENTIAL/PLATELET
Basophils Absolute: 0 10*3/uL (ref 0.0–0.1)
HCT: 35 % — ABNORMAL LOW (ref 36.0–46.0)
Lymphs Abs: 1.1 10*3/uL (ref 0.7–4.0)
Monocytes Absolute: 0.5 10*3/uL (ref 0.1–1.0)
Monocytes Relative: 7.8 % (ref 3.0–12.0)
Platelets: 299 10*3/uL (ref 150.0–400.0)
RDW: 14.6 % (ref 11.5–14.6)

## 2011-02-05 LAB — HEMOGLOBIN A1C: Hgb A1c MFr Bld: 6 % (ref 4.6–6.5)

## 2011-02-08 ENCOUNTER — Ambulatory Visit: Payer: Medicare Other | Attending: Radiation Oncology | Admitting: Radiation Oncology

## 2011-02-08 ENCOUNTER — Encounter: Payer: Self-pay | Admitting: Internal Medicine

## 2011-02-15 NOTE — Letter (Signed)
Summary: consider screening cscope 2016--- Gastroenterology  [facility]   Imported By: Maryln Gottron 02/02/2011 12:32:47  _____________________________________________________________________  External Attachment:    Type:   Image     Comment:   External Document

## 2011-02-15 NOTE — Assessment & Plan Note (Signed)
Summary: HOSPITAL FOLLOW UP/RH.....   Vital Signs:  Patient profile:   70 year old female Weight:      191.13 pounds Pulse rate:   80 / minute Pulse rhythm:   regular BP sitting:   122 / 78  (left arm) Cuff size:   regular  Vitals Entered By: Army Fossa CMA (February 05, 2011 1:04 PM) CC: Hospital f/u- doing better Comments [facility] [address]  on a med from cancer doc- unsure of name    History of Present Illness:  DATE OF ADMISSION:  12/26/2010   DATE OF DISCHARGE:  01/02/2011    1. Constipation with large stool burden.   2. Mild gastrointestinal bleed secondary to anal fissure and stercoral   rectal ulcers.     she was admitted to the hospital with severe constipation, she also had anemia. GI was consulted, they did a sigmoidoscopy that showed  impacted stool and stercoral  ulcerations   She was discharged on MiraLax and Colace   labs and x-rays thephylline levels less than 0.5 Potassium 3.9, creatinine 0.9, TSH 1.0  last hemoglobin was 9.7 on January 24   Review of systems Since she left the hospital, she is doing better Good compliance with MiraLax and Colace No nausea, vomiting, diarrhea No blood in the stools or pain in the anal area She also saw GI, note  reviewed, no further  workup is recommended  except considering a colonoscopy in 2016 for screening purposes  Current Medications (verified): 1)  Simvastatin 40 Mg Tabs (Simvastatin) .Marland Kitchen.. 1 By Mouth  Every Morning 2)  Glucophage 1000 Mg  Tabs (Metformin Hcl) .... Take 1 Tablet By Mouth Once Daily 3)  Singulair 10 Mg  Tabs (Montelukast Sodium) .Marland Kitchen.. 1 By Mouth Qd 4)  Proair Hfa 108 (90 Base) Mcg/act Aers (Albuterol Sulfate) .... 2 Puffs Four Times A Day As Needed 5)  Mucinex Dm 30-600 Mg  Tb12 (Dextromethorphan-Guaifenesin) .Marland Kitchen.. 1-2 Tabs By Mouth Every 12 Hours As Needed As Needed 6)  Centrum Silver   Tabs (Multiple Vitamins-Minerals) .... Take 1 Tablet By Mouth Once A Day 7)  Citracal + D 250-200 Mg-Unit   Tabs (Calcium Citrate-Vitamin D) .... 2 Once Daily 8)  One Touch Ultra Test Strips .... Checks Blood Sugar 1x/day, Dx 250.00 9)  Onetouch Ultrasoft Lancets   Misc (Lancets) .... Use As Directed 10)  Cetirizine Hcl 10 Mg Tabs (Cetirizine Hcl) .... Once Daily As Needed 11)  Stool Softner & Laxative  Allergies (verified): 1)  ! * Bp Checks in Wrist Only 2)  Penicillin G Potassium (Penicillin G Potassium)  Past History:  Past Medical History: Reviewed history from 08/17/2010 and no changes required. Diabetes mellitus, type II Hyperlipidemia Asthma, Dx in adulthood (70y/o)                    - PFT 09/08/09 FEV1 1.70/81%; FEV1/FVC 0.75; + resp to dilator; Nl LV/ DLCO breast cancer, hx of,  BILATERAL diagnosed in 2006, recurred 2011- Dr Donnie Coffin                     Drue Flirt, XRT, Femara Colonic polyps, hx of Osteopenia Ovarian Mass, resolved Allergic rhinitis OSA CPAP intolerant- NPSG 08/09/03- AHI 66/hr PERIPHERAL NEUROPATHY   OBESITY (ICD-278.00) negative stress test in 2004  Past Surgical History: Reviewed history from 01/20/2010 and no changes required. Appendectomy MASTECTOMY, BILATERAL, HX OF  ?pilonidal cyst surgery  Physical Exam  General:  alert, well-developed, and overweight-appearing.  Lungs:  normal respiratory effort, no intercostal retractions, no accessory muscle use, and normal breath sounds.   Heart:  normal rate, regular rhythm, and no murmur.   Abdomen:  soft, non-tender, no distention, no masses, no guarding, no rigidity, no hepatomegaly, and no splenomegaly.   Psych:  Cognition and judgment appear intact. Alert and cooperative with normal attention span and concentration.     Impression & Recommendations:  Problem # 1:  CONSTIPATION, CHRONIC (ICD-564.09)  recent constipation requiring hospital admission. Doing better She used  to take milk of magnesia, currently on MiraLax and  Colace Plan: Continue with the present care.  she had mild anemia while in  hospital, labs Her updated medication list for this problem includes:    Miralax Powd (Polyethylene glycol 3350) .Marland Kitchen... Dily , otc  Orders: TLB-CBC Platelet - w/Differential (85025-CBCD)  Problem # 2:  DIABETES MELLITUS, TYPE II (ICD-250.00)   check a hemoglobin A1c Her updated medication list for this problem includes:    Glucophage 1000 Mg Tabs (Metformin hcl) .Marland Kitchen... Take 1 tablet by mouth once daily  Labs Reviewed: Creat: 0.6 (01/20/2010)    Reviewed HgBA1c results: 6.9 (08/04/2010)  6.9 (01/20/2010)  Orders: Venipuncture (01027) TLB-A1C / Hgb A1C (Glycohemoglobin) (83036-A1C) Specimen Handling (25366)  Complete Medication List: 1)  Simvastatin 40 Mg Tabs (Simvastatin) .Marland Kitchen.. 1 by mouth  every morning 2)  Glucophage 1000 Mg Tabs (Metformin hcl) .... Take 1 tablet by mouth once daily 3)  Singulair 10 Mg Tabs (Montelukast sodium) .Marland Kitchen.. 1 by mouth qd 4)  Proair Hfa 108 (90 Base) Mcg/act Aers (Albuterol sulfate) .... 2 puffs four times a day as needed 5)  Mucinex Dm 30-600 Mg Tb12 (Dextromethorphan-guaifenesin) .Marland Kitchen.. 1-2 tabs by mouth every 12 hours as needed as needed 6)  Centrum Silver Tabs (Multiple vitamins-minerals) .... Take 1 tablet by mouth once a day 7)  Citracal + D 250-200 Mg-unit Tabs (Calcium citrate-vitamin d) .... 2 once daily 8)  One Touch Ultra Test Strips  .... Checks blood sugar 1x/day, dx 250.00 9)  Onetouch Ultrasoft Lancets Misc (Lancets) .... Use as directed 10)  Cetirizine Hcl 10 Mg Tabs (Cetirizine hcl) .... Once daily as needed 11)  Stool Softner & Laxative  12)  Miralax Powd (Polyethylene glycol 3350) .... Dily , otc  Patient Instructions: 1)  Please schedule a follow-up appointment in 3 to 4  months, fating physical exam    Orders Added: 1)  Venipuncture [36415] 2)  TLB-CBC Platelet - w/Differential [85025-CBCD] 3)  TLB-A1C / Hgb A1C (Glycohemoglobin) [83036-A1C] 4)  Specimen Handling [99000] 5)  Est. Patient Level IV [44034]

## 2011-02-15 NOTE — Progress Notes (Signed)
Summary: needs f/up   [facility] 2/3  letter 2/16  Phone Note Call from Patient   Caller: Patient Summary of Call: per Dr Drue Novel, patient needs followup appt around the date of 2/9---called and [facility] for her to call back and schedule Initial call taken by: Jerolyn Shin,  January 12, 2011 11:41 AM  Follow-up for Phone Call        mailed letter.Marland KitchenMarland KitchenJerolyn Shin  January 25, 2011 11:19 AM  Additional Follow-up for Phone Call Additional follow up Details #1::        Patient is scheduled for a hospital follow up today.  The Doctor will assess and give patient instructions for future appointments. Additional Follow-up by: Barnie Mort,  February 05, 2011 9:57 AM

## 2011-02-27 LAB — CBC
MCHC: 34.3 g/dL (ref 30.0–36.0)
Platelets: 283 10*3/uL (ref 150–400)
RBC: 4.35 MIL/uL (ref 3.87–5.11)
RDW: 13.6 % (ref 11.5–15.5)

## 2011-02-27 LAB — COMPREHENSIVE METABOLIC PANEL
ALT: 16 U/L (ref 0–35)
AST: 20 U/L (ref 0–37)
Albumin: 3.9 g/dL (ref 3.5–5.2)
Calcium: 9.8 mg/dL (ref 8.4–10.5)
GFR calc Af Amer: >60 mL/min (ref >60)
Sodium: 140 mEq/L (ref 135–145)
Total Protein: 7.3 g/dL (ref 6.0–8.3)

## 2011-02-27 LAB — DIFFERENTIAL
Eosinophils Absolute: 0.3 10*3/uL (ref 0.0–0.7)
Eosinophils Relative: 3 % (ref 0–5)
Lymphs Abs: 1.6 10*3/uL (ref 0.7–4.0)
Monocytes Relative: 7 % (ref 3–12)

## 2011-02-27 NOTE — Letter (Signed)
Summary: finished XRT----Cancer Center  [facility]   Imported By: Maryln Gottron 02/21/2011 14:38:09  _____________________________________________________________________  External Attachment:    Type:   Image     Comment:   External Document

## 2011-04-05 ENCOUNTER — Encounter: Payer: Self-pay | Admitting: Internal Medicine

## 2011-04-23 ENCOUNTER — Other Ambulatory Visit: Payer: Self-pay | Admitting: Internal Medicine

## 2011-04-27 NOTE — Consult Note (Signed)
[facility]  Patient:    [name], [name]                 MRN: [id number] Proc. Date: 12/18/00 Adm. Date:  [id number] Attending:  Jeannette Corpus CC:         Maris Berger. Pennie Rushing, M.D.  Telford Nab, R.N.   Consultation Report  REASON FOR CONSULTATION:  A 70 year old African-American female referred by Dr. Dierdre Forth for a second opinion regarding management of ultrasound abnormalities of the ovaries. Apparently the patient had an ultrasound obtained approximately a year ago as part of her routine gynecologic examination. She was entirely asymptomatic but because of her obesity it was felt that ultrasound might clarify her pelvic anatomy. Apparently that ultrasound was normal. Subsequently the patient had an office ultrasound in November and then a follow-up ultrasound at [facility] on December 3 which showed essentially a normal uterus except for a 1.4 cm uterine fibroid. The endometrial stripe was thin. The left ovary had a sub-centimeter size follicle and a focal hypoechoic nodular area in the left ovary measuring 1.1 cm in maximum diameter. The right ovary had a follicle measuring 7 mm and a focal echogenic structure measuring 0.3 x 0.2 cm with a focus of calcium.  The patient is entirely symptomatic from a gynecologic point of view. She has not had any bleeding and she does not take any hormone replacement therapy. She does have a past history of taking infertility drugs for three months a number of years ago. She also has taken oral contraceptives for over five years.  Pap smears have always been normal.  OBSTETRICAL HISTORY:  Gravida 2.  PAST MEDICAL HISTORY:  Asthma, obesity.  PAST SURGICAL HISTORY:  Diagnostic laparoscopy.  CURRENT MEDICATIONS:  Prednisone, Accolate, theophylline, Ecotrin, Proventil, Azmacort, and Serevent inhalers.  ALLERGIES:  None.  SOCIAL HISTORY:  The patient runs a Programmer, applications. She does not smoke. She is a former Engineer, site.  REVIEW OF SYSTEMS:  The patient has had some intermittent left sided pain. She reports she has had colonoscopy and was found to have colonic polyps which were benign.  PHYSICAL EXAMINATION:  VITAL SIGNS:  Height 5 foot 6, weight 248 pounds.  HEENT:  Normal.  NECK:  Supple without thyromegaly.  ABDOMEN:  Soft, nontender, no mass, organomegaly, ascites or hernias are noted.  PELVIC:  EGBUS is normal. Vagina is clean, well supported. The cervix is normal. Uterus and adnexa are difficult to outline. I did not feel any masses or nodularity. Rectovaginal exam confirms.  Records from Dr. Dot Lanes office were reviewed.  IMPRESSION:  Small abnormalities in the ovaries which do not sound to be of significance. I doubt that she has an ovarian cancer and feel comfortable recommending that she be followed with serial ultrasounds every three months. I had a lengthy discussion with the patient regarding these findings and recommendations and she is happy and comfortable to pursue this course rather than surgical intervention. She will return to Dr. Charleen Kirks office and we will ask Dr. Charleen Kirks office to arrange for ultrasound in early March at [facility] for comparison. DD:  12/18/00 TD:  12/18/00 Job: [id number]

## 2011-04-27 NOTE — Op Note (Signed)
[name], [name]                  ACCOUNT NO.:  [id number]   MEDICAL RECORD NO.:  [id number]                   PATIENT TYPE:  AMB   LOCATION:  DSC                                  FACILITY:  [facility]   PHYSICIAN:  Sharlet Salina T. Hoxworth, M.D.          DATE OF BIRTH:  Feb 22, 1941   DATE OF PROCEDURE:  05/01/2004  DATE OF DISCHARGE:                                 OPERATIVE REPORT   PREOPERATIVE DIAGNOSIS:  Lipoma of back.   POSTOPERATIVE DIAGNOSIS:  Lipoma of back.   OPERATION PERFORMED:  Excision of lipoma of back.   SURGEON:  Lorne Skeens. Hoxworth, M.D.   ANESTHESIA:  General.   INDICATIONS FOR PROCEDURE:  [name] is a 70 year old black female who  followed with a larger fleshy mass on the back beneath the right scapula.  This has enlarged on follow-up exam and now measures about 12 cm in  diameter.  The physical findings were consistent with lipoma.  Due to  progressive enlargement, we have elected to proceed with excision.  The  nature of the procedure, its indications and risks of bleeding and infection  were discussed and understood.  She is now brought to the operating room for  this procedure.   DESCRIPTION OF PROCEDURE:  The patient was brought to the operating room and  placed in supine position on the operating table and general endotracheal  anesthesia was induced.  She was then carefully positioned in the left  lateral decubitus position and the back was widely sterilely prepped and  draped.  The mass was just beneath the scapula on the right side.  A  transverse incision was made directly over the mas and dissection carried  down through subcutaneous tissue.  Dissection was carried down onto the  latissimus muscle and the mass was deep to this.  The latissimus was divided  along the line of its fibers directly over the mass and dissection was  carried onto a quite discrete typical appearing lipoma.  Using cautery and  blunt dissection, this was  dissected away from surrounding muscle tissue and  lay deep against the chest wall.  The mass was completely excised down to  the vascular pedicle which was suture ligated with 3-0 Vicryl and the mass  was completely excised.  Hemostasis was assured with cautery.  The soft  tissue was infiltrated with Marcaine with epinephrine.  The wound was then  closed in layers with running 3-0 Vicryl and the skin closed with running 4-  0 nylon.  Sponge, needle and instrument counts were correct.  Dry sterile  dressings were applied.  The patient was taken to the recovery room in good  condition.                                               Lorne Skeens. Hoxworth, M.D.  BTH/MEDQ  D:  05/01/2004  T:  05/01/2004  Job:  [id number]

## 2011-04-27 NOTE — Procedures (Signed)
[facility]  Patient:    [name], [name] Visit Number: [id number] MRN: [id number]          Service Type: END Location: ENDO Attending Physician:  Louie Bun Dictated by:   Everardo All Madilyn Fireman, M.D. Proc. Date: 06/22/02 Admit Date:  06/22/2002   CC:         Marthann Schiller, M.D.   Procedure Report  PROCEDURE:  Colonoscopy.  INDICATIONS FOR PROCEDURE:  History of adenomatous colon polyps with multiple polyps seen on last colonoscopy one year ago.  DESCRIPTION OF PROCEDURE:  The patient was placed in the left lateral decubitus position and placed on the pulse monitor, with continuous low flow oxygen delivered by nasal cannula.  She was sedated with 70 mg IV Demerol and 7 mg IV Versed.  The Olympus video colonoscope was inserted into the rectum and advanced its full length.  However, despite abdominal pressure and multiple torquing maneuvers and multiple position changes; including the right lateral decubitus and prone positions, I was unable to reach the cecum.  It was not certain, but felt that the point of most proximal visualization was probably the proximal transverse colon.  The mucosa in this area appeared normal, as did the transverse, descending and sigmoid colon.  The rectum likewise appeared normal in retroflexed view.  The anus revealed no obvious internal hemorrhoids.  The colonoscope was then withdrawn and the patient returned to the recovery room in stable condition.  She tolerated the procedure well and there were no immediate complications.  IMPRESSION:  Normal and complete colonoscopy to approximately the proximal transverse colon.  PLAN:  Repeat colonoscopy in three years. Dictated by:   Everardo All Madilyn Fireman, M.D. Attending Physician:  Louie Bun DD:  06/22/02 TD:  06/23/02 Job: [id number]

## 2011-04-27 NOTE — Op Note (Signed)
[name], [name]                  ACCOUNT NO.:  [id number]   MEDICAL RECORD NO.:  [id number]                   PATIENT TYPE:  INP   LOCATION:  0443                                 FACILITY:  [facility]   PHYSICIAN:  Lorre Munroe., M.D.            DATE OF BIRTH:  March 13, 1941   DATE OF PROCEDURE:  08/07/2004  DATE OF DISCHARGE:                                 OPERATIVE REPORT   PREOPERATIVE DIAGNOSIS:  Acute appendicitis with perforation and  peritonitis.   OPERATION:  Appendectomy, irrigation of the abdomen.   SURGEON:  Zigmund Daniel, M.D.   ANESTHESIA:  General.   DESCRIPTION OF PROCEDURE:  After the patient was monitored and anesthetized  and had a Foley catheter and routine preparation and draping of the abdomen,  I made a lower midline incision to just below the umbilicus to just above  the symphysis.  I dissected down through the fat until I encountered the  fascia and incised that in the midline and bluntly entered the peritoneum  and then opened it through the length of the wound.  Cloudy fluid with a bad  odor was present.  I took cultures.  I then used the suction to remove most  of the fluid from the pelvis and the right gutter.  There was inflammatory  mass of the right lower quadrant.  I broke up the adhesions of the mass and  found that there was a gangrenous, perforated appendix.  I grasped that and  freed up its adhesions to the lateral abdominal wall and the ileum and  clamped the mesentery with Kelly clamps and then suture ligated the vessels  with 2-0 silk.  I dissected the appendix up, and I found that the  seromuscular portion of it as it entered the cecum was healthy in  appearance.  I sutured through the soft tissue adjacent to the appendix and  ligated it with 2-0 silk, and that securely closed it when I amputated it.  I cauterized the exposed remaining mucosa.  I then copiously irrigated the  right lower quadrant and assured good hemostasis.   I then copiously  irrigated the entire abdominal cavity with approximately 5 L of saline  solution using most of the irrigation in the pelvis.  At closure of the  irrigation, the return was clear.  The sponge, needle, and instrument counts  were correct.  A brief exploration disclosed no other abdominal  abnormalities except for a very small fibroid of the uterus.  I closed the  fascia with running #1 PDS, and I left the subcutaneous tissues loosely  closed with a few staples in the skin and packed with moist gauze.  I  applied a bulky bandage.  She tolerated the operation well.  Lorre Munroe., M.D.    Jodi Marble  D:  08/07/2004  T:  08/08/2004  Job:  [id number]   cc:   Wanda Plump, MD [facility]  [phone] [address] [place] [facility] [place], [place] [address]

## 2011-04-27 NOTE — Discharge Summary (Signed)
[facility]  Patient:    [name], [name] Visit Number: [id number] MRN: [id number]          Service Type: MED Location: 5000 [id number] Attending Physician:  Eino Farber Dictated by:   Rosanna Randy Su Hilt, N.P. Adm. Date:  06/21/2001 Disc. Date: 07/01/2001                             Discharge Summary  ADMISSION DIAGNOSES: 1. Status asthmaticus. 2. Chronic obstructive pulmonary disease. 3. Type 2 diabetes mellitus, steroid induced. 4. Obesity.  DISCHARGE DIAGNOSES: 1. Status asthmaticus. 2. Chronic obstructive pulmonary disease with asthma. 3. Acute bronchitis, allergic. 4. Type 2 diabetes mellitus uncontrolled, new onset. 5. Obesity, moderate. 6. Situational stress.  REASON FOR ADMISSION:  This is one of several [name]. [facility] hospitalizations for [name], a 70 year old asthmatic of more than 25 years who presents to the ER with one week of progressive wheezing, coughing, and shortness of breath.  She was seen by Eino Farber., M.D., in the office a few days ago and started Levaquin in addition to her theophylline 200 mg q.12h., Singulair, and prednisone that she usually takes but she continued to experience the above symptoms and began to notice coughing and dyspnea on exertion. She came into the ER. Despite three treatments with albuterol nebulizer treatments, she continued to have significant shortness of breath, despite blood gases on room air indicating a pO2 of 78, pCO2 of 35.  ALLERGIES:  PENICILLIN.  LABS AND STUDIES:  The EKG on June 29, 2001, showed sinus tachycardia.  On June 21, 2001, on room air, ABG showed pH of 7.396, pCO2 of 35, pO2 78, bicarb 22.  On June 29, 2001, on room air, ABG showed pH of 7.469, pCO2 40.3, pO2 67.2, bicarb 28.0.  On June 21, 2001, CBC showed wbc 11.9, hemoglobin 13.1, hematocrit 38.1, platelets 339.  On June 23, 2001, CBC showed wbc  21.7, hemoglobin 12.5, hematocrit 36.6, platelets 401.  Stool for occult blood was negative x 2.  Erythrocyte sedimentation rate on June 21, 2001, was 81; on June 22, 2001, was 54.  On June 21, 2001, chemistries were within normal limits except glucose was 280.  On June 29, 2001, chemistries were within normal limits except CO2 was 33 and glucose was 163.  On June 27, 2001, hemoglobin A1C was 10.4.  On June 21, 2001, TSH was 0.540, June 22, 2001, TSH was 0.398.  Theophylline level on June 22, 2001, was 10.2; on June 24, 2001, was 10.8.  On June 27, 2001, respiratory culture showed normal oropharyngeal flora.  HOSPITAL COURSE:  The patient was admitted and IV therapy was initiated. She was started on Solu-Medrol 80 mg IV q.8h. Albuterol nebulizer therapy for five mg and 0.5 mg of Atrovent q.i.d. was ordered and given and the patient was started on theophylline 25 mg per hour.  She was started on Singulair 10 mg p.o. q.d.  Also, nasal O2 was placed.  Added to her plan of care was Humibid LA one p.o. b.i.d. and also Cipro 500 mg p.o. b.i.d. was added to her plan of care after obtaining a sputum for CNS.  The patients theophylline level was changed to 31 mg per hour IV.  She also started on a sliding scale of Humalog. Her blood sugars continued to be uncontrolled due to new onset diabetes type 2 and she was started on glucophage  XR 500 mg q.d. with breakfast.  While she was on theophylline, her theophylline levels were obtained.  Her glucophage was increased to glucophage 500 mg one p.o. _______ 5 p.m. dinner meal and changed glucophage XR 500 mg one q.a.m. with breakfast meal and one q.p.m. with dinner meal.  Her Solu-Medrol decreased to 40 mg IV q.8h. to help decrease her diabetes but continued to help with her respiratory condition. Advair 250/50 one puff q.a.m. and q.h.s. was added also to decrease her coughing.  Tussionex 5 cc q.a.m. and q.h.s. were added to her plan of care. It appeared  that the patient was having some skin reaction to Cipro.  Cipro was then discontinued and the patient was started on Biaxin 500 mg tablet p.o. q.a.m. and q.h.s.  Also, Humibid LA two tablets q.a.m. and q.h.s. was added. The patient had some problems with constipation. Dulcolax suppository p.r.n. was added for constipation and she was started on MiraLax 15 g in eight ounces of water b.i.d.  She continued to have her Solu-Medrol tapered to 40 mg IV q.12h.  She was encouraged and did ambulate in the halls.  Peak flow pre- and post nebulizer treatment was done.  She was obtained a glucometer and was told how to use it.  She had a diabetic consult due to her new onset diabetes.  The Solu-Medrol continued to be tapered.  Her glucophage was changed to Glucovance 2.5/500 one p.o. with breakfast and q. p.o. with dinner meal.  She continued to have problems with her constipation so she had an enema of choice.  Her Solu-Medrol was decreased to 20 mg IV q.a.m. then her theophylline drip was discontinued and she was started on theophylline CR 200 mg p.o. q.a.m. and q.h.s.  Her Solu-Medrol was discontinued and the patient was given prednisone 10 mg tablet p.o. q.a.m. with breakfast and Paxil 10 mg tablet one q.h.s. was added to her plan of care.  She continued to have diabetic teaching of glucose checks and insulin while she was in the hospital.  DISCHARGE CONDITION:  The patient was discharged home in stable condition. The patient continued to improve with less cough and less shortness of breath and diabetic control improving.  DISCHARGE MEDICATIONS: 1. Theophylline CR 200 mg tablet one q.a.m. and q.h.s. 2. Advair 250/50 Diskus inhaled q.a.m. and q.h.s. 3. Albuterol metered dose inhaler one to two puffs q.4h. p.r.n. for chest    tightness. 4. Prednisone 5 mg tablet one q.d. x 7 days then one Monday, Wednesday, Friday    if no further asthma attacks. 5. Glucophage XR 500 mg tablet one q.d. with  breakfast. 6. Paxil 10 mg tablet one q.h.s. for rest and tension. 7. Accolate 20 mg tablet one q.d. 8. Humibid LA one b.i.d.  DISCHARGE INSTRUCTIONS:  1. Activity:  Ambulate as tolerated. 2. Diet:  A 1500 calorie ADA diet, no added salt. 3. Special instructions:  Outpatient appointment for diabetes education    at [facility] July 08, 2001, at 3 p.m.  Blood glucose    checks every a.m. before breakfast.  Notify office if blood glucose is    above 200 or below 80.  Bring record to appointment. 4. Follow-up:  A follow-up office appointment with Eino Farber.,    M.D., on August 21, 2001, at 2:30 p.m.  Dictated by:   Rosanna Randy. Su Hilt, N.P. Attending Physician:  Eino Farber DD:  07/30/01 TD:  07/31/01 Job: [id number]

## 2011-04-27 NOTE — Discharge Summary (Signed)
NAMEJHENE, [name] NO.:  [id number]   MEDICAL RECORD NO.:  [id number]          PATIENT TYPE:  INP   LOCATION:  5731                         FACILITY:  [facility]   PHYSICIAN:  Sharlet Salina T. Hoxworth, M.D.DATE OF BIRTH:  12-Jul-1941   DATE OF ADMISSION:  01/30/2005  DATE OF DISCHARGE:  02/02/2005                                 DISCHARGE SUMMARY   DISCHARGE DIAGNOSES:  Bilateral breast cancer.   OPERATION/PROCEDURE:  Bilateral total mastectomy with axillary sentinel  lymph node biopsy January 30, 2005.   HISTORY OF PRESENT ILLNESS:  [name] is a 70 year old female who on  recent breast imaging with ultrasound has been found to have a new  suspicious area at the lower inner quadrant of the left breast.  Core  biopsies revealed invasive mammary carcinoma.  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  After  extensive discussion in the office detailed elsewhere we elected to proceed  with bilateral mastectomy and sentinel lymph node biopsy.  She was admitted  following this procedure.   PAST MEDICAL HISTORY:  1.  Significant for asthma requiring hospitalization 1999 and 2001.  2.  AODM oral agent controlled.  3.  She has neuropathy.  4.  Also cholesterol.   PAST SURGICAL HISTORY:  1.  Significant for lipoma removal.  2.  Appendicitis.   MEDICATIONS:  1.  Glucophage 500 mg daily.  2.  Theophylline daily.  3.  Singulair daily.  4.  Zocor daily.  5.  Calcium supplement.   ALLERGIES:  PENICILLIN.   SOCIAL HISTORY:  See detailed H&P.   FAMILY HISTORY:  See detailed H&P.   REVIEW OF SYSTEMS:  See detailed H&P.   PHYSICAL EXAMINATION:  VITAL SIGNS:  Weight 206 pounds.  Vital signs within  normal limits.  BREASTS:  Some slight thickening at the biopsy site, but no other  abnormalities.  No palpable adenopathy.   HOSPITAL COURSE:  Patient was admitted the morning of her procedure and  underwent bilateral total mastectomy with axillary sentinel lymph node  biopsy.  Axillary nodes were negative on touch prep.  She did develop a  moderate sized hematoma in the right breast noted on the first postoperative  morning.  This was not expanding, was modest in size without excessive JP  output and this was observed.  This stabilized and JP output decreased.  She  is discharged home on the third postoperative day.  Dressings were intact.  Follow-up was to be in my office later this week.      BTH/MEDQ  D:  03/29/2005  T:  03/29/2005  Job:  [id number]

## 2011-04-27 NOTE — Discharge Summary (Signed)
NAMEJAZALYNN, [name]                  ACCOUNT NO.:  [id number]   MEDICAL RECORD NO.:  [id number]                   PATIENT TYPE:  INP   LOCATION:  0444                                 FACILITY:  [facility]   PHYSICIAN:  Lorre Munroe., M.D.            DATE OF BIRTH:  1941/04/22   DATE OF ADMISSION:  08/07/2004  DATE OF DISCHARGE:  08/18/2004                                 DISCHARGE SUMMARY   HISTORY:  The patient is a 70 year old black female with a 2 day history of  lower abdominal pain and constipation.  She has some chronic constipation as  well.  She had some difficulty urinating. Dr. Drue Novel saw her in the office and  ordered a CT scan which showed evidence of probable perforated appendix with  inflammatory mass of the right lower quadrant.  Free fluid was noted in the  pelvis.  Some free air was present.  I saw her at the emergency department  in consultation and found that she had a white count of 16,900 and a very  tender abdomen.  Lipase was normal.  Glucose was 172.  She was felt to have  a perforated viscus and admitted to hospital for surgery.   PAST HISTORY:  1.  The patient is a type 2 diabetic on Glucophage.  2.  She has had intermittent problems with asthma and uses Theophylline and      Advair.  She has been on steroids at times in the past for that.  3.  She is on Zocor for hyperlipidemia and is hypothyroid on replacement.  4.  No known medicine allergies, although she had a perianal rash once after      taking PENICILLIN.  See the history and physical for further details.   PHYSICAL EXAMINATION:  GENERAL:  The patient was in pain.  VITAL SIGNS:  Temperature 102.6, and the vital signs were stable.  CHEST AND HEART:  Unremarkable.  ABDOMEN:  Tender in all quadrants, most so in the right lower quadrant.   HOSPITAL COURSE:  The patient was advised to undergo exploration and agreed  to that.  I found that she had a perforated appendix with abscess and with  rather severe generalized peritonitis.  Preoperatively and postoperatively,  I gave her broad spectrum intravenous antibiotics and IV fluid support, pain  medicine.  I partially closed her incision, and it became somewhat purulent  after a couple of days but cleaned up nicely with dressing changes.  However, it is still healing and will need to heal by secondary intention.  She was therefore discharged from the hospital to have home health come each  day and change her bandage.  The patient's return of GI function was slow.  This was not surprising given her history of chronic constipation.  However,  on August 18, 2004, she reported good bowel movements.  She was sent home  and arrangements made for follow up care with home health.  The patient  was  seen on date of discharge by [facility], Dr. Felicity Coyer, who felt she  was stable for discharge home, and no medication changes were needed.  Follow up was arranged with Dr. Drue Novel.  The patient was to see me at the  office in several days for inspection of the wound.  Some staples remained  in place.   DIAGNOSES:  1.  Perforated appendix with abscess and peritonitis.  2.  Type 2 diabetes.  3.  Hyperlipidemia.  4.  Asthma, well controlled.  5.  Chronic constipation.   OPERATION:  Appendectomy and irrigation of abdomen.   DISCHARGE CONDITION:  Improved and further improving slowly.      WB/MEDQ  D:  08/25/2004  T:  08/27/2004  Job:  [id number]   cc:   Wanda Plump, MD [facility]  [phone] [address] [place], [place] [address]

## 2011-04-27 NOTE — Procedures (Signed)
[facility]  Patient:    [name], [name] Visit Number: [id number] MRN: [id number]          Service Type: END Location: ENDO Attending Physician:  Louie Bun Proc. Date: 04/25/01 Admit Date:  04/25/2001   CC:         Cynthia P. Ashley Royalty, M.D.                           Procedure Report  PROCEDURE:  Colonoscopy.  INDICATION FOR PROCEDURE:  History of colon polyps, due for surveillance.  DESCRIPTION OF PROCEDURE:  The patient was placed in the left lateral decubitus position and placed on the pulse monitor with continuous low-flow oxygen delivered by nasal cannula.  She was sedated with 70 mg IV Demerol and 7 mg IV Versed.  The Olympus video colonoscope was inserted into the rectum and advanced to the cecum, confirmed by transillumination at McBurneys point and visualization of the ileocecal valve and appendiceal orifice.  The prep was fairly good.  There was a small polyp in the base of the cecum which was fulgurated by hot biopsy.  The ascending colon appeared normal.  Within the transverse colon, two more polyps were seen and fulgurated by hot biopsy.  In the descending and sigmoid colon, there were seen two polyps, the largest of which was approximately 1.2 cm in diameter, and these were fulgurated by snare.  One additional polyp near the rectosigmoid junction was also removed by snare.  No other abnormalities were noted.  The colonoscope was then withdrawn, and the patient returned to the recovery room in stable condition. She tolerated the procedure well, and there were no immediate complications.  IMPRESSION:  Multiple colon polyps.  PLAN:  Await histology and due to the numerous polyps, will probably repeat procedure in one year. Attending Physician:  Louie Bun DD:  04/25/01 TD:  04/26/01 Job: [id number]

## 2011-04-27 NOTE — Op Note (Signed)
NAMENATHALI, VENT NO.:  [id number]   MEDICAL RECORD NO.:  [id number]          PATIENT TYPE:  OIB   LOCATION:  5731                         FACILITY:  [facility]   PHYSICIAN:  Sharlet Salina T. Hoxworth, M.D.DATE OF BIRTH:  [birth year]   DATE OF PROCEDURE:  01/30/2005  DATE OF DISCHARGE:                                 OPERATIVE REPORT   PREOPERATIVE DIAGNOSIS:  Bilateral breast cancer with invasive and extensive  ductal carcinoma in situ, left breast, and localized ductal carcinoma in  situ, right breast.   POSTOPERATIVE DIAGNOSIS:  Bilateral breast cancer with invasive and  extensive ductal carcinoma in situ, left breast, and localized ductal  carcinoma in situ, right breast.   SURGICAL PROCEDURES:  1.  Bilateral breast blue dye injection.  2.  Bilateral axillary sentinel lymph node biopsies.  3.  Bilateral total mastectomies.   SURGEON.:  Sharlet Salina T. Hoxworth, M.D.   ANESTHESIA:  General.   BRIEF HISTORY:  [name] is a 70 year old female with a recent  abnormal mammogram of the left breast.  She has had an extensive workup with  findings of an approximately 1.5-cm invasive carcinoma of the left breast  associated with extensive DCIS and microinvasion in the left breast and also  a more localized area of DCIS without evidence of invasion in the right  breast.  After extensive discussion of treatment options, we have elected  proceed with bilateral total mastectomy with bilateral axillary sentinel  lymph node biopsies and possible axillary dissections. The nature of the  surgery, expected recovery, risks of bleeding, infection, wound-healing  problems with possible need for further surgery based on final pathology  have been discussed and understood.  The patient is now brought to the  operating room for these procedures.   DESCRIPTION OF OPERATION:  The patient had undergone injection of 500 mCi of  technetium-94m sulfur colloid in each breast  subareolarly about 2 hours  prior to the procedure.  She was taken to the operating room and placed in  supine position on the operating table and general endotracheal anesthesia  was induced.  The entire chest, upper abdomen, neck and shoulders were  sterilely prepped and draped.  The right side was then initially isolated  and the left side exposed.  Ten milliliters of Lymphazurin blue were  injected subareolarly in the right breast and massaged for several minutes.  A transverse elliptical incision was then made, encompassing the nipple-  areolar complex and central breast.  Initially, skin and subcutaneous flaps  were raised superiorly up to the level of the clavicle and laterally out  toward the latissimus dorsi and axilla.  The NeoProbe was then used to  isolate a hot area in the left axilla.  Dissection was carried down onto  this bluntly into the axilla proper and a bright blue hot node encountered.  This was dissected free using blunt and cautery dissection and excised  intact.  Ex vivo, the node had counts in excess of 2000 with background in  the axilla of less than 50.  This was sent as a hot blue axillary lymph  node.  Also noted in the  axilla was another slightly enlarged bright blue  node in the low axilla which was also excised and sent as a blue, cold  secondary sentinel lymph node.  Following this, further flaps were raised  inferiorly down toward the origin of the rectus.  The breast was then  reflected off the pectoralis, working and medial to lateral.  Attachments  along the serratus were divided with cautery and the specimen was mobilized  from the lateral edge of the pectoralis major.  Examination of the low  axilla at this point did reveal several somewhat enlarged palpable nodes,  although they were not really hard or obviously involved with tumor.  The  touch prep returned at this point with the primary sentinel node negative  for tumor; the secondary node showed  some questionable atypical cells, but  not clearly any evidence for metastatic cancer.  I did elect to perform a  very limited axillary dissection on the left side, excising level I and  probably some level II nodes that were somewhat prominent.  The axillary  tissue was very mobile and dissected easily up out of the axilla.  I did not  go posteriorly around the thoracodorsal and long thoracic nerves.  We came  across the axilla at this point with clamps and the specimens removed and  this area was tied with 3-0 Vicryl ties.  This removed any palpable nodes  and the remainder of the tissue high in the axilla and back posteriorly felt  completely normal.  This wound was irrigated and hemostasis assured.  A  moist saline pack was placed in the wound; it was covered and the right side  exposed.  Gowns, instruments and gloves were all changed.  On the right  side, an identical elliptical excision was made, encompassing the nipple-  areolar complex and central breast, and again, the superior flap was raised  initially up toward the clavicle and out laterally over the axilla.  Again,  the NeoProbe was used to isolate a definite hot area in the right axilla and  dissection was carried down onto a bright blue lymph node, which was  completely excised with cautery.  Ex vivo counts were around 1000, but there  was still significant activity in the axilla and more posteriorly, a second  node, also bright blue, slightly enlarged but soft, was encountered and this  was completely dissected free using cautery and blunt dissection and ex vivo  counts were 2700 with background of less than 50.  This was sent as the  primary sentinel node and the previously excised  node on the right was sent  as the secondary sentinel node.  Following this, flaps were raised medially  over to the sternum, inferiorly to the origin of the rectus sheath.  The  breast was then reflected up off the chest wall with cautery,  detaching it from the lateral pectoralis and from the lateral chest wall.  The dissection  was carried down to the axilla and we came across the low axillary content  with clamps and specimen removed, and this was tied with 3-0 Vicryl.  Sentinel nodes were both negative on this side by touch prep.  This wound  was thoroughly irrigated and hemostasis assured. A 19 Blake closed-suction  drain was brought out through a separate stab wound and placed underneath  both flaps.  The skin was closed with staples.  The left side was then  inspected for hemostasis, which appeared complete. and a drain was placed  identically and this wound also closed with staples.  Sponge, needle and  instrument counts were correct. Dry sterile dressings were applied and the  patient was taken recovery in good condition.      BTH/MEDQ  D:  01/30/2005  T:  01/31/2005  Job:  [id number]

## 2011-04-27 NOTE — H&P (Signed)
[name], [name]                  ACCOUNT NO.:  [id number]   MEDICAL RECORD NO.:  [id number]                   PATIENT TYPE:  INP   LOCATION:  0103                                 FACILITY:  [facility]   PHYSICIAN:  Lorre Munroe., M.D.            DATE OF BIRTH:  February 21, 1941   DATE OF ADMISSION:  08/07/2004  DATE OF DISCHARGE:                                HISTORY & PHYSICAL   CHIEF COMPLAINT:  Abdominal pain.   PRESENT ILLNESS:  The patient is a 70 year old black female who has a 2-day  history of lower abdominal pain, difficulty with bowel movement, and some  difficulty urinating.  Dr. Drue Novel saw her in the office today and was worried  about obstruction or inflammatory process, sent her to the hospital for a CT  scan, which showed evidence of probable perforated appendix with  inflammatory mass in the right lower quadrant with free fluid in the  abdomen, and a little free air.  I was consulted to evaluate her for this  problem.  There is no history of any chronic GI problems.  Her white count  was 16,900.  Her glucose was 172.  Lipase was normal.   PAST MEDICAL HISTORY:  She is a type 2 diabetic, and is on Glucophage.  She  has a history of intermittent asthma, and uses theophylline and Advair.  She  has been on steroids occasionally.  She also takes Synthroid for  hypothyroidism, and Zocor for hyperlipidemia.  Her hyperlipidemia has come  down nicely.   ALLERGIES:  No known medication allergies, although she has had in the past  an unusual perianal rash after she had taken PENICILLIN for a good long  while.   The remainder of the past medical history is generally unremarkable with no  history of any heart trouble.  She has had back surgery several months ago.  She got over that nicely.  She does not smoke or drink.  She has not had  problems with high blood pressure.   REVIEW OF SYSTEMS:  Unremarkable, beyond the above.  Specifically, she  denies that her diabetes  has been out of control.  She says she has no  history of any vascular disease.  She has had some pain in the feet, but no  numbness of her feet.  She has not had any wheezing recently.  No chest pain  or shortness of breath.   FAMILY HISTORY AND CHILDHOOD ILLNESSES:  Unremarkable.   PHYSICAL EXAMINATION:  VITAL SIGNS:  Temperature and vital signs per nursing  staff.  Admission temperature was 102.6.  The vital signs have been stable  in the emergency department.  GENERAL:  The patient's mental status is normal.  No acute distress.  HEENT:  Unremarkable.  The thyroid is not enlarged.  CHEST:  Clear to auscultation.  HEART:  Rate and rhythm are normal with no murmur or gallop.  BREASTS:  Normal.  ABDOMEN:  Obese, and appeared  also to be distended.  It was tender in all  quadrants, most tender in the right lower quadrant with a sense of fullness,  some rebound tenderness, and spasm.  Bowel sounds were absent.  RECTAL/PELVIC:  Not performed.  EXTREMITIES:  No edema.  Good pulses.  No deformities or cyanosis.  SKIN:  No lesions noted.  LYMPH NODES:  None enlarged in the groin, axilla, or neck.  NEUROLOGIC:  Grossly normal.   IMPRESSION:  Acute abdominal inflammation, probable perforated appendix with  abscess.   PLAN:  The patient has been given IV antibiotics.  As soon as we can arrange  it, I will take her to the operating room for exploration and treatment of  the problem.                                               Lorre Munroe., M.D.    Jodi Marble  D:  08/07/2004  T:  08/08/2004  Job:  [id number]   cc:   Wanda Plump, MD [facility]  [phone] [place], [place] [address]

## 2011-05-04 ENCOUNTER — Other Ambulatory Visit: Payer: Self-pay | Admitting: Internal Medicine

## 2011-06-22 ENCOUNTER — Encounter: Payer: Self-pay | Admitting: Internal Medicine

## 2011-06-22 ENCOUNTER — Ambulatory Visit (INDEPENDENT_AMBULATORY_CARE_PROVIDER_SITE_OTHER): Payer: Medicare Other | Admitting: Internal Medicine

## 2011-06-22 DIAGNOSIS — J45909 Unspecified asthma, uncomplicated: Secondary | ICD-10-CM

## 2011-06-22 DIAGNOSIS — L509 Urticaria, unspecified: Secondary | ICD-10-CM

## 2011-06-22 DIAGNOSIS — S61509A Unspecified open wound of unspecified wrist, initial encounter: Secondary | ICD-10-CM

## 2011-06-22 MED ORDER — BUDESONIDE-FORMOTEROL FUMARATE 80-4.5 MCG/ACT IN AERO: 2.0000 | INHALATION_SPRAY | Freq: Two times a day (BID) | RESPIRATORY_TRACT | Status: DC

## 2011-06-22 MED ORDER — ALBUTEROL SULFATE HFA 108 (90 BASE) MCG/ACT IN AERS: 2.0000 | INHALATION_SPRAY | Freq: Four times a day (QID) | RESPIRATORY_TRACT | Status: DC

## 2011-06-22 MED ORDER — ALBUTEROL SULFATE 0.63 MG/3ML IN NEBU: 1.0000 | INHALATION_SOLUTION | Freq: Four times a day (QID) | RESPIRATORY_TRACT | Status: DC | PRN

## 2011-06-22 NOTE — Assessment & Plan Note (Signed)
Wrist wound not completely healed, no evidence of infection. I wonder if she is having itching from a reaction to the over-the-counter antibiotic ointment or the Band-Aids. Recommend leave the wound open, use hydrocortisone 1% over-the-counter to see the dermatologist.

## 2011-06-22 NOTE — Patient Instructions (Addendum)
Take Zyrtec and Singulair every day until you see Dr. Marian Sorrow the wrist wound open, only use hydrocortisone 1% over-the-counter cream twice a day. See your dermatologist. Schedule your physical exam at your earliest convenience Re-start symbicort If he has severe hives, tongue swelling or difficulty breathing: Go to the ER

## 2011-06-22 NOTE — Assessment & Plan Note (Addendum)
Not well controlled x 3 weeks, using nebs and inhalers (ventolin) Plan----> restart symbicort, sample and a Rx provided, concept of recue vs daily meds reviewed

## 2011-06-22 NOTE — Assessment & Plan Note (Addendum)
3 episodes of hives in the last 6 months, last episode today, already resolving. I carefully reviewed her medications and she's not taking anything new. Plan: Refer to Dr. Maple Hudson who saw the patient before Continue with the Zyrtec and singular every day until she sees Dr. Maple Hudson. ER if severe symptoms, difficulty breathing or tongue swelling. She has wheezing from time to time but temporarily unrelated to hives, likely from asthma

## 2011-06-22 NOTE — Progress Notes (Signed)
  Subjective:    Patient ID: [name], female    DOB: 1941/04/17, 70 y.o.   MRN: [id number]  HPI Had a skin biopsy at the left wrist 04-29-11 per dermatology, results showed no cancer. The wound has no heal completely. She is using Neosporin and a Band-Aid on and off, developed some redness yesterday and itching.  Also in the last 6 months she has developed hives that self resolve quickly. They are in the face, lips and inner leg. Last episode this morning, already better.  Also, occasional wheezing, needs refill on albuterol. Asthma sx have not been well controlled lately (x 3 weeks): wheezing and chest congestion more frequently noted   Past Medical History  Diagnosis Date  . Diabetes mellitus   . Hyperlipidemia   . Asthma     dx in adulthood (70 y/o) PFT 09/08/09 FEV1 1.70/81%; FEV1/FVC 0.75; +resp to dilartor; NI LV/DLCO  . Breast ca     hx of bilateral diagnosied in 2006, recurred 2011- Dr Claria Dice  . Colonic polyp   . Osteopenia   . Mass of ovary     resolved  . Allergic rhinitis   . OSA (obstructive sleep apnea)     CPAP intolerant  . Peripheral neuropathy    Past Surgical History  Procedure Date  . Appendectomy   . Mastectomy     bilateral  . Pilonidal cyst surgery     ? of      Review of Systems  No fever, some sinus congestion. Prior to the 3 weeks used albuterol infrecuently Denies any wheezing or shortness of breath concomitant to hives. Denies any tongue swelling.     Objective:   Physical Exam Alert oriented in no apparent distress. Face symmetric, some redness at the left cheek mostly covered by makeup. Lips hyperpigmented but not swollen at this point. Tongue, normal to inspection. Lung: + end exp. Wheezing B, no rales, no increased WOB Skin and the inner right leg close to the groin is slt red but no swollen or warm Left wrist has a wound, 60% of it is healed, 40% is not. The unhealed area is 3x2 mm. The wound is  surrounded by some redness, no warmness or swelling.       Assessment & Plan:  Today , I spent more than 25  min with the patient, >50% of the time counseling about wound care and asthma managment

## 2011-06-26 ENCOUNTER — Other Ambulatory Visit: Payer: Self-pay | Admitting: Internal Medicine

## 2011-07-09 ENCOUNTER — Other Ambulatory Visit: Payer: Self-pay | Admitting: Internal Medicine

## 2011-07-11 ENCOUNTER — Encounter: Payer: Self-pay | Admitting: Internal Medicine

## 2011-07-16 ENCOUNTER — Ambulatory Visit (INDEPENDENT_AMBULATORY_CARE_PROVIDER_SITE_OTHER): Payer: Medicare Other | Admitting: Internal Medicine

## 2011-07-16 ENCOUNTER — Other Ambulatory Visit: Payer: Medicare Other

## 2011-07-16 ENCOUNTER — Encounter: Payer: Self-pay | Admitting: Internal Medicine

## 2011-07-16 ENCOUNTER — Ambulatory Visit (INDEPENDENT_AMBULATORY_CARE_PROVIDER_SITE_OTHER)
Admission: RE | Admit: 2011-07-16 | Discharge: 2011-07-16 | Disposition: A | Discharge status: Home / Self Care | Payer: Medicare Other | Source: Ambulatory Visit | Attending: Internal Medicine | Admitting: Internal Medicine

## 2011-07-16 VITALS — BP 118/66 | HR 85 | Ht 64.0 in | Wt 200.0 lb

## 2011-07-16 DIAGNOSIS — J45909 Unspecified asthma, uncomplicated: Secondary | ICD-10-CM

## 2011-07-16 DIAGNOSIS — G4733 Obstructive sleep apnea (adult) (pediatric): Secondary | ICD-10-CM

## 2011-07-16 DIAGNOSIS — R21 Rash and other nonspecific skin eruption: Secondary | ICD-10-CM

## 2011-07-16 DIAGNOSIS — R05 Cough: Secondary | ICD-10-CM

## 2011-07-16 NOTE — Patient Instructions (Signed)
Order- CXR- asthma             ACE level- cough, rash

## 2011-07-16 NOTE — Assessment & Plan Note (Addendum)
Failed CPAP. I discussed medical concerns and symptoms again. Consider trial of Provent nasal valves.l Encourage weight loss.

## 2011-07-16 NOTE — Assessment & Plan Note (Signed)
Increased use of rescue inhaler. She has more crackle than expected on exam, and CXR was clear in January. We will try to put these together- get ACE level and CXR.

## 2011-07-16 NOTE — Progress Notes (Signed)
Subjective:    Patient ID: [name], female    DOB: December 30, 1940, 70 y.o.   MRN: [id number]  HPI 07/16/11- 70 yoF former smoker followed for OSA/ failed CPAP, asthma, allergic rhinitis complicated by DM, hx breast cancer Blames heat for need to resume using her rescue inhaler..  She associates purple areas on lips from using her nebulizer machine- ? Hard enough to bruise? She couldn't tolerate and stopped using CPAP. We reviewed symptoms and warning signs again.  She has had spots on hands, pruritic, " inconclusive " on biopsy by dermatologist, Dr Margo Aye. Admits night sweats, cough, scant yellow phlegm.  Last CXR 12/27/10 f/u for recurrent breast cancer- left.  Review of Systems Constitutional:   No-   weight loss, night sweats, fevers, chills, fatigue, lassitude. HEENT:   No-   headaches, difficulty swallowing, tooth/dental problems, sore throat,                  No-   sneezing, itching, ear ache, nasal congestion, post nasal drip,   CV:  No-   chest pain, orthopnea, PND, swelling in lower extremities, anasarca, dizziness, palpitations  GI:  No-   heartburn, indigestion, abdominal pain, nausea, vomiting, diarrhea,                 change in bowel habits, loss of appetite  Resp: No-   shortness of breath with exertion or at rest.  No-  excess mucus,             No-   productive cough,  No non-productive cough,  No-  coughing up of blood.              No-   change in color of mucus.  No- wheezing.    Skin: No-   rash or lesions.  GU: No-   dysuria, change in color of urine, no urgency or frequency.  No- flank pain.  MS:  No-   joint pain or swelling.  No- decreased range of motion.  No- back pain.  Psych:  No- change in mood or affect. No depression or anxiety.  No memory loss.      Objective:   Physical Exam General- Alert, Oriented, Affect-appropriate, Distress- none acute    overweight Skin- small isolated slightly raised lesions on dorsum right hand, between finger left  hand. Purple areas on lips.  Lymphadenopathy- none Head- atraumatic            Eyes- Gross vision intact, PERRLA, conjunctivae clear secretions            Ears- Hearing, canals normal            Nose- Clear, no-Septal dev, mucus, polyps, erosion, perforation             Throat- Mallampati III , mucosa clear , drainage- none, tonsils- atrophic Neck- flexible , trachea midline, no stridor , thyroid nl, carotid no bruit Chest - symmetrical excursion , unlabored           Heart/CV- RRR , no murmur , no gallop  , no rub, nl s1 s2                           - JVD- none , edema- none, stasis changes- none, varices- none           Lung- bilateral crackle                   ,  dullness-none, rub- none           Chest wall-  Abd- tender-no, distended-no, bowel sounds-present, HSM- no Br/ Gen/ Rectal- Not done, not indicated Extrem- cyanosis- none, clubbing, none, atrophy- none, strength- nl Neuro- grossly intact to observation         Assessment & Plan:

## 2011-07-20 ENCOUNTER — Telehealth: Payer: Self-pay | Admitting: Internal Medicine

## 2011-07-20 NOTE — Telephone Encounter (Signed)
Pt advised of CXR results per result note. Carron Curie, CMA

## 2011-07-21 ENCOUNTER — Other Ambulatory Visit: Payer: Self-pay | Admitting: Internal Medicine

## 2011-07-23 NOTE — Telephone Encounter (Signed)
Rx Done . 

## 2011-08-16 ENCOUNTER — Other Ambulatory Visit: Payer: Self-pay | Admitting: Internal Medicine

## 2011-08-16 NOTE — Telephone Encounter (Signed)
Rx Done . 

## 2011-08-29 ENCOUNTER — Telehealth: Payer: Self-pay | Admitting: Internal Medicine

## 2011-08-29 ENCOUNTER — Encounter: Payer: Self-pay | Admitting: Internal Medicine

## 2011-08-29 ENCOUNTER — Ambulatory Visit (INDEPENDENT_AMBULATORY_CARE_PROVIDER_SITE_OTHER): Payer: Medicare Other | Admitting: Internal Medicine

## 2011-08-29 DIAGNOSIS — Z0000 Encounter for general adult medical examination without abnormal findings: Secondary | ICD-10-CM | POA: Insufficient documentation

## 2011-08-29 DIAGNOSIS — M79609 Pain in unspecified limb: Secondary | ICD-10-CM

## 2011-08-29 DIAGNOSIS — J45909 Unspecified asthma, uncomplicated: Secondary | ICD-10-CM

## 2011-08-29 DIAGNOSIS — E119 Type 2 diabetes mellitus without complications: Secondary | ICD-10-CM

## 2011-08-29 DIAGNOSIS — K5909 Other constipation: Secondary | ICD-10-CM

## 2011-08-29 DIAGNOSIS — M899 Disorder of bone, unspecified: Secondary | ICD-10-CM

## 2011-08-29 DIAGNOSIS — Z853 Personal history of malignant neoplasm of breast: Secondary | ICD-10-CM

## 2011-08-29 DIAGNOSIS — E785 Hyperlipidemia, unspecified: Secondary | ICD-10-CM

## 2011-08-29 DIAGNOSIS — C50919 Malignant neoplasm of unspecified site of unspecified female breast: Secondary | ICD-10-CM

## 2011-08-29 MED ORDER — CLOTRIMAZOLE-BETAMETHASONE 1-0.05 % EX CREA: TOPICAL_CREAM | CUTANEOUS | Status: DC

## 2011-08-29 MED ORDER — DOXYCYCLINE HYCLATE 100 MG PO TABS: 100.0000 mg | ORAL_TABLET | Freq: Two times a day (BID) | ORAL | Status: AC

## 2011-08-29 NOTE — Telephone Encounter (Signed)
Please call [facility] and schedule a B MMG and a L axilary u/s Dx f/u breast ca Lump at the  L axila

## 2011-08-29 NOTE — Assessment & Plan Note (Signed)
We have not been able to get a FLP recently, although she is not fasting will check lipids

## 2011-08-29 NOTE — Assessment & Plan Note (Signed)
DEXA 05-2010: Normal rec vit D rec a healthy diet, will try to avoid Ca supplements d/t constipation

## 2011-08-29 NOTE — Progress Notes (Signed)
Subjective:    Patient ID: [name], female    DOB: Nov 03, 1941, 70 y.o.   MRN: [id number]  HPI Here for Medicare AWV: 1. Risk factors based on Past M, S, F history: reviewed 2. Physical Activities:  Sedentary, once a week does exercise w/ seniors  3. Depression/mood: more depressed lately d/t issues (rental house etc), no suicidal ; will let we know if she ever needs help-medication 4. Hearing:  No problems noted or reported 5. ADL's:  Completely independent, still drives  6. Fall Risk: no recent falls, average risk 7. home Safety: does feel safe at home  8. Height, weight, &visual acuity: see VS, due to see the eye doctor , plans to go 9. Counseling: provided 10. Labs ordered based on risk factors: if needed  11. Referral Coordination: if needed 12.  Care Plan, see assessment and plan  13.   Cognitive Assessment: Cognition  and motor skills appropriate  In addition, today we discussed the following: Diabetes, good medication compliance, blood sugars are ~ 116 High cholesterol, good medication compliance, no apparent side effects. Constipation, continue with issues, had diarrhea last week, monitored by GI. Slightly better today. Denies any fever, nausea, vomiting. She did have some left sided abdominal pain which is not uncommon when she has stomach problems. Has a "spot" at the left axillary area, tender for the last 2 weeks but at the same time states that it  has been present for years Asthma, good compliance with medications, still coughs from time to time. Skin lesions, status post evaluation by dermatology, etiology unclear, prescribe steroids. Breast cancer, extensive chart review, see assessment and plan.  Past Medical History  Diagnosis Date  . Diabetes mellitus   . Hyperlipidemia   . Asthma     dx in adulthood (70 y/o) PFT 09/08/09 FEV1 1.70/81%; FEV1/FVC 0.75; +resp to dilartor; NI LV/DLCO  . Breast ca     hx of bilateral diagnosied in 2006, recurred 2011-  Dr Claria Dice  . Colonic polyp   . Osteopenia   . Mass of ovary     resolved  . Allergic rhinitis   . OSA (obstructive sleep apnea)     CPAP intolerant  . Peripheral neuropathy    Past Surgical History  Procedure Date  . Appendectomy   . Mastectomy 2011    bilateral  . Pilonidal cyst surgery     ? of     Family History: breast Cancer-- GM, sister colon cancer--no diabetes-- B . 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 Fever: "hard to say", has hot flashes   Cardiovascular: Negative for chest pain and leg swelling.  Gastrointestinal: Negative for nausea, vomiting and anal bleeding.  Genitourinary: Negative for dysuria, hematuria and difficulty urinating.   still has occasional leg cramps with walking     Objective:   Physical Exam  Constitutional: She is oriented to person, place, and time. She appears well-developed and well-nourished. No distress.  HENT:  Head: Normocephalic and atraumatic.  Neck: No thyromegaly present.  Cardiovascular: Normal rate, regular rhythm and normal heart sounds.   No murmur heard.      Normal bilateral pedal pulses  Pulmonary/Chest: Effort normal  and breath sounds normal. No respiratory distress. She has no wheezes. She has no rales.  Abdominal: Soft.       Increased BS, no distended, mild diffuse tenderness w/o mass or rebound  Genitourinary:       B mastectomy scars normal to palpation R axila: normal L axila has a 3x1 cm area of mild redness, soft, tender, no fluctuant   Musculoskeletal: She exhibits no edema.  Lymphadenopathy:    She has no cervical adenopathy.  Neurological: She is alert and oriented  to person, place, and time.  Skin: She is not diaphoretic.  Psychiatric: She has a normal mood and affect. Her behavior is normal. Judgment and thought content normal.          Assessment & Plan:  In addition to CPX, I did extensive chart review regards mammograms, axillary ultrasound and other  chronic to issues. In addition to the CPX I spent > 25 minutes with the patient.

## 2011-08-29 NOTE — Assessment & Plan Note (Signed)
Still occ sx, good med compliance , will se Dr Maple Hudson soon, no change

## 2011-08-29 NOTE — Assessment & Plan Note (Signed)
H/o constipation and occ diarrhea, per GI notes 01-2011, diarrhea likely overflow ; having still problems, rec to call GI

## 2011-08-29 NOTE — Assessment & Plan Note (Addendum)
Oncologist --> last visit 04-2010----->  Refer to him Had a R MMG u/s 08-2010, was rec to f/u in 6 months but apparently didn't , has a  MMG schedule for 10-12 thus she is over due: will call [facility] and schedule a B MMG and a L axilary u/s (see below) Has a area of redness in the L axila: chronic per pt, rec doxy and lotrisone, See instructions

## 2011-08-29 NOTE — Patient Instructions (Addendum)
Take vit D 600 to 1000 u a day Flu shot October 2012 Doxycycline and lotrisone to the L axila, if no better in 2 weeks: call me

## 2011-08-29 NOTE — Assessment & Plan Note (Addendum)
she has chronic symptoms that suggest claudication: leg cramps w/ walking in the past, a commercial ABI done at work showed some cholesterol build up in the legs had a  normal ABIs on November 2007 done because leg pain suspicious for claudication. Pedal pulse is normal on exam plan: Observation

## 2011-08-29 NOTE — Assessment & Plan Note (Signed)
Due for labs

## 2011-08-29 NOTE — Assessment & Plan Note (Addendum)
Td 2003, pneumonia shot 2008, shingles shot 2009 rec flu shot 09-2011  C scope 2006 (-) but incomplete, (-) ACBE; per GI notes :neg virtual Cscope 4-11, considering a cscope 2016  (GI Dr Dulce Sellar); h/o tortuos colon  Sees  Gyn: Dr. Pennie Rushing Diet and exercise discussed

## 2011-08-30 LAB — COMPREHENSIVE METABOLIC PANEL
AST: 22 U/L (ref 0–37)
Albumin: 4.3 g/dL (ref 3.5–5.2)
BUN: 14 mg/dL (ref 6–23)
CO2: 26 mEq/L (ref 19–32)
Calcium: 10.1 mg/dL (ref 8.4–10.5)
Chloride: 105 mEq/L (ref 96–112)
GFR: 108.49 mL/min (ref >60.00)
Glucose, Bld: 82 mg/dL (ref 70–99)
Potassium: 3.9 mEq/L (ref 3.5–5.1)

## 2011-08-30 LAB — CBC WITH DIFFERENTIAL/PLATELET
Basophils Absolute: 0.1 10*3/uL (ref 0.0–0.1)
Eosinophils Absolute: 0.5 10*3/uL (ref 0.0–0.7)
MCHC: 32.9 g/dL (ref 30.0–36.0)
MCV: 92.8 fl (ref 78.0–100.0)
Monocytes Absolute: 0.7 10*3/uL (ref 0.1–1.0)
Neutrophils Relative %: 38.4 % — ABNORMAL LOW (ref 43.0–77.0)
Platelets: 305 10*3/uL (ref 150.0–400.0)

## 2011-08-30 LAB — LIPID PANEL
Cholesterol: 147 mg/dL (ref 0–200)
LDL Cholesterol: 67 mg/dL (ref 0–99)

## 2011-08-30 LAB — HEMOGLOBIN A1C: Hgb A1c MFr Bld: 6.5 % (ref 4.6–6.5)

## 2011-09-03 ENCOUNTER — Telehealth: Payer: Self-pay | Admitting: *Deleted

## 2011-09-03 NOTE — Telephone Encounter (Signed)
Message copied by Trenton Gammon on Mon Sep 03, 2011  9:34 AM ------      Message from: Wanda Plump      Created: Fri Aug 31, 2011  4:49 PM       Advise patient:      Cholesterol and diabetes well controlled, continue routine meds.      All other labs normal. Very good results.

## 2011-09-03 NOTE — Telephone Encounter (Signed)
Message copied by Trenton Gammon on Mon Sep 03, 2011  9:24 AM ------      Message from: Wanda Plump      Created: Fri Aug 31, 2011  4:49 PM       Advise patient:      Cholesterol and diabetes well controlled, continue routine meds.      All other labs normal. Very good results.

## 2011-09-06 ENCOUNTER — Other Ambulatory Visit: Payer: Self-pay | Admitting: Internal Medicine

## 2011-09-06 NOTE — Telephone Encounter (Signed)
Done

## 2011-09-11 ENCOUNTER — Telehealth (INDEPENDENT_AMBULATORY_CARE_PROVIDER_SITE_OTHER): Payer: Self-pay | Admitting: General Surgery

## 2011-09-19 ENCOUNTER — Ambulatory Visit (INDEPENDENT_AMBULATORY_CARE_PROVIDER_SITE_OTHER): Payer: Medicare Other | Admitting: General Surgery

## 2011-09-19 ENCOUNTER — Encounter (INDEPENDENT_AMBULATORY_CARE_PROVIDER_SITE_OTHER): Payer: Self-pay | Admitting: General Surgery

## 2011-09-19 VITALS — BP 112/68 | HR 64 | Temp 97.5°F | Resp 16 | Ht 63.5 in | Wt 197.2 lb

## 2011-09-19 DIAGNOSIS — R2230 Localized swelling, mass and lump, unspecified upper limb: Secondary | ICD-10-CM

## 2011-09-19 DIAGNOSIS — R229 Localized swelling, mass and lump, unspecified: Secondary | ICD-10-CM

## 2011-09-19 NOTE — Progress Notes (Signed)
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 She had DCIS of the right breast. She refused hormone treatment postoperatively. She then developed a local recurrence in the left mastectomy scar over one year ago treated with excision with negative margins and radiation to the chest wall. She has had no evidence of recurrence to date. She now has a two-week history of a tender lump in the left axilla. She has a history of "boils" in both axilla consistent with hidradenitis. She has also had some skin lesions on her hands and lips for which he is seeing a dermatologist in the last few weeks. One of her hand was biopsied and by her report was nondiagnostic and I do not have access to these records.  Physical exam: Gen.: Well-developed in no acute distress HEENT: No palpable masses or thyromegaly. Lymph nodes: No palpable cervical or supraclavicular lymph nodes Chest: Well healed mastectomy scars bilaterally. The right chest wall is entirely negative. The left chest wall shows mild post radiation changes. There are no abnormalities around the incision. However in the skin of the left axilla is a 2 x 0.5 cm area of induration erythema and slight fluctuance which is tender.    Assessment and plan: Skin lesion left axilla which seems most consistent with hidradenitis but with her history I certainly would also have some concern of recurrent breast cancer. I discussed options with her including incision and drainage in the office today with close followup versus complete excision in the operating room. She prefers the former which is reasonable.  Under local anesthesia I unroofed this area. There were some secretions and slight purulent material in a deep subcutaneous pocket. This was  opened up and packed. I excised the surface of the lesion this was sent for permanent pathology. I'll plan to see her back in 2 weeks and will call her with the path report. If the report is negative for malignancy and this area resolves I don't take any further treatment as necessary. I gave her 10 days of doxycycline 500 mg b.i.d.

## 2011-09-19 NOTE — Patient Instructions (Signed)
Removed from gauze packing tomorrow and clean the area in the shower daily.

## 2011-09-25 ENCOUNTER — Telehealth (INDEPENDENT_AMBULATORY_CARE_PROVIDER_SITE_OTHER): Payer: Self-pay

## 2011-09-25 NOTE — Telephone Encounter (Signed)
Left voice message for patient to call our office for her pathology results

## 2011-09-26 ENCOUNTER — Encounter (INDEPENDENT_AMBULATORY_CARE_PROVIDER_SITE_OTHER): Payer: Self-pay

## 2011-09-27 ENCOUNTER — Encounter (INDEPENDENT_AMBULATORY_CARE_PROVIDER_SITE_OTHER): Payer: Self-pay

## 2011-09-27 ENCOUNTER — Other Ambulatory Visit: Payer: Self-pay | Admitting: Internal Medicine

## 2011-09-27 NOTE — Telephone Encounter (Signed)
Done

## 2011-09-28 ENCOUNTER — Encounter: Payer: Self-pay | Admitting: Internal Medicine

## 2011-10-04 ENCOUNTER — Encounter (INDEPENDENT_AMBULATORY_CARE_PROVIDER_SITE_OTHER): Payer: Self-pay | Admitting: General Surgery

## 2011-10-04 ENCOUNTER — Encounter (INDEPENDENT_AMBULATORY_CARE_PROVIDER_SITE_OTHER): Payer: Self-pay | Admitting: Internal Medicine

## 2011-10-10 ENCOUNTER — Telehealth: Payer: Self-pay | Admitting: Oncology

## 2011-10-10 ENCOUNTER — Encounter (INDEPENDENT_AMBULATORY_CARE_PROVIDER_SITE_OTHER): Payer: Self-pay | Admitting: General Surgery

## 2011-10-10 ENCOUNTER — Ambulatory Visit (INDEPENDENT_AMBULATORY_CARE_PROVIDER_SITE_OTHER): Payer: Medicare Other | Admitting: General Surgery

## 2011-10-10 VITALS — BP 126/82 | HR 88 | Temp 98.1°F | Ht 63.5 in | Wt 193.8 lb

## 2011-10-10 DIAGNOSIS — Z09 Encounter for follow-up examination after completed treatment for conditions other than malignant neoplasm: Secondary | ICD-10-CM

## 2011-10-10 NOTE — Progress Notes (Signed)
Patient returns following excision of the skin lesion from her left axilla with a significant personal history of skin recurrence of breast cancer in her mastectomy wound. Fortunately this proved to be an area of hidradenitis with no evidence of malignancy.  On exam today the area is well healed.  She continues to have problems with itching blistering rash on her arms and hands. This does not look anything like process under her arm. She has seen a dermatologist and has a appointment to followup tomorrow.  I will plan to see her for cancer followup in 6 months.

## 2011-10-16 ENCOUNTER — Ambulatory Visit (INDEPENDENT_AMBULATORY_CARE_PROVIDER_SITE_OTHER): Payer: Medicare Other | Admitting: Internal Medicine

## 2011-10-16 ENCOUNTER — Encounter: Payer: Self-pay | Admitting: Internal Medicine

## 2011-10-16 VITALS — BP 118/64 | HR 61 | Ht 64.0 in | Wt 197.8 lb

## 2011-10-16 DIAGNOSIS — T7840XA Allergy, unspecified, initial encounter: Secondary | ICD-10-CM

## 2011-10-16 DIAGNOSIS — G4733 Obstructive sleep apnea (adult) (pediatric): Secondary | ICD-10-CM

## 2011-10-16 DIAGNOSIS — Z23 Encounter for immunization: Secondary | ICD-10-CM

## 2011-10-16 NOTE — Progress Notes (Signed)
Patient ID: [name], female    DOB: 1941/04/13, 70 y.o.   MRN: [id number]  HPI 07/16/11- 57 yoF former smoker followed for OSA/ failed CPAP, asthma, allergic rhinitis complicated by DM, hx breast cancer Blames heat for need to resume using her rescue inhaler..  She associates purple areas on lips from using her nebulizer machine- ? Hard enough to bruise? She couldn't tolerate and stopped using CPAP. We reviewed symptoms and warning signs again.  She has had spots on hands, pruritic, " inconclusive " on biopsy by dermatologist, Dr Margo Aye. Admits night sweats, cough, scant yellow phlegm.  Last CXR 12/27/10 f/u for recurrent breast cancer- left.    10/16/11- 59 yoF former smoker followed for OSA/ failed CPAP, asthma, allergic rhinitis complicated by DM, hx breast cancer She reports an urticarial reaction to peanuts 2 months ago and is unsure if she has had a reaction in the past 2 chocolate. She would like to look at food allergy testing so she knows better to talk to watch out for. We discussed food allergy versus intolerance.  She is taking prednisone and valacyclovir for a hyperpigmented rash on her hands, after bx by dermatologist. Possibly viral warts. Allergy symptoms do better in cold weather but will also improve on her steroids. Cough has been drier with scant clear sputum. She has not needed her nebulizer machine at all.  Chest x-ray: 07/16/2011-NAD, mild CE, bronchitis changes, arthritis in spine.   Review of Systems See HPI Constitutional:   No-   weight loss, night sweats, fevers, chills, fatigue, lassitude. HEENT:   No-  headaches, difficulty swallowing, tooth/dental problems, sore throat,       No-  sneezing, itching, ear ache, nasal congestion, post nasal drip,  CV:  No-   chest pain, orthopnea, PND, swelling in lower extremities, anasarca, dizziness, palpitations Resp: No-   shortness of breath with exertion or at rest.              No-   productive cough,  No  non-productive cough,  No- coughing up of blood.              No-   change in color of mucus.  No- wheezing.   Skin: No-   rash or lesions. GI:  No-   heartburn, indigestion, abdominal pain, nausea, vomiting, diarrhea,                 change in bowel habits, loss of appetite GU: No-   dysuria, change in color of urine, no urgency or frequency.  No- flank pain. MS:  No-   joint pain or swelling.  No- decreased range of motion.  No- back pain. Neuro-     nothing unusual Psych:  No- change in mood or affect. No depression or anxiety.  No memory loss.   Objective:   Physical Exam General- Alert, Oriented, Affect-appropriate, Distress- none acute    overweight Skin- small isolated slightly raised lesions on dorsum right hand, between finger left hand. Purple areas on lips.  Lymphadenopathy- none Head- atraumatic            Eyes- Gross vision intact, PERRLA, conjunctivae clear secretions            Ears- Hearing, canals normal            Nose- Clear, no-Septal dev, mucus, polyps, erosion, perforation             Throat- Mallampati III , mucosa clear , drainage- none,  tonsils- atrophic Neck- flexible , trachea midline, no stridor , thyroid nl, carotid no bruit Chest - symmetrical excursion , unlabored           Heart/CV- RRR , no murmur , no gallop  , no rub, nl s1 s2                           - JVD- none , edema- none, stasis changes- none, varices- none           Lung- bilateral crackle                   , dullness-none, rub- none           Chest wall-  Abd- tender-no, distended-no, bowel sounds-present, HSM- no Br/ Gen/ Rectal- Not done, not indicated Extrem- cyanosis- none, clubbing, none, atrophy- none, strength- nl Neuro- grossly intact to observation

## 2011-10-16 NOTE — Patient Instructions (Addendum)
Flu vax  Remember- if you are going to eat where you are not sure if there might be peanuts included in a recipe- avoid what you can, but it wouldn't hurt to pre-treat yourself with an antihistamine an hour or two ahead.   Please call as needed

## 2011-11-10 DIAGNOSIS — I10 Essential (primary) hypertension: Secondary | ICD-10-CM

## 2011-11-10 HISTORY — DX: Essential (primary) hypertension: I10

## 2011-11-23 ENCOUNTER — Other Ambulatory Visit (HOSPITAL_BASED_OUTPATIENT_CLINIC_OR_DEPARTMENT_OTHER): Payer: Medicare Other

## 2011-11-23 ENCOUNTER — Other Ambulatory Visit: Payer: Self-pay | Admitting: Oncology

## 2011-11-23 ENCOUNTER — Ambulatory Visit (HOSPITAL_BASED_OUTPATIENT_CLINIC_OR_DEPARTMENT_OTHER): Payer: Medicare Other | Admitting: Oncology

## 2011-11-23 DIAGNOSIS — Z853 Personal history of malignant neoplasm of breast: Secondary | ICD-10-CM

## 2011-11-23 DIAGNOSIS — D0590 Unspecified type of carcinoma in situ of unspecified breast: Secondary | ICD-10-CM

## 2011-11-23 DIAGNOSIS — E559 Vitamin D deficiency, unspecified: Secondary | ICD-10-CM

## 2011-11-23 DIAGNOSIS — M949 Disorder of cartilage, unspecified: Secondary | ICD-10-CM

## 2011-11-23 DIAGNOSIS — C50219 Malignant neoplasm of upper-inner quadrant of unspecified female breast: Secondary | ICD-10-CM

## 2011-11-23 DIAGNOSIS — C50919 Malignant neoplasm of unspecified site of unspecified female breast: Secondary | ICD-10-CM

## 2011-11-23 LAB — CBC WITH DIFFERENTIAL/PLATELET
BASO%: 0.2 % (ref 0.0–2.0)
EOS%: 4.2 % (ref 0.0–7.0)
MCH: 31.3 pg (ref 25.1–34.0)
MCHC: 34.3 g/dL (ref 31.5–36.0)
MCV: 91.2 fL (ref 79.5–101.0)
MONO%: 6.7 % (ref 0.0–14.0)
RBC: 4.33 10*6/uL (ref 3.70–5.45)
RDW: 14.4 % (ref 11.2–14.5)
lymph#: 1.3 10*3/uL (ref 0.9–3.3)

## 2011-11-23 NOTE — Progress Notes (Signed)
Pt has been instructed to check her BP several times weekly and keep a written record. Pt states that she "has a lot of stress" in her life right now. Pt verbalizes that if BP continues to stay elevated she will consult her PCP

## 2011-11-23 NOTE — Progress Notes (Signed)
Hematology and Oncology Follow Up Visit  [name] [id number] [birth year] 70 y.o. 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 Lt breast cancer, invasive cancer,  largest focus T1 tubular type cancer. Recurrence in lt axillary fold 2011, s/p reexcision , xrt and now on letrozole. Bone density has been stable  Interim History:  10/12 seen by dr Johna Sheriff for purulent lesion lt axilla, thought to be folliculitis  Medications: I have reviewed the patient's current medications.  Allergies:  Allergies  Allergen Reactions  . Penicillins Other (See Comments)    REACTION: outer rectal itching    Past Medical History, Surgical history, Social history, and Family History were reviewed and updated.  Review of Systems: Constitutional:  Negative for fever, chills, night sweats, anorexia, weight loss, pain. Cardiovascular: no chest pain , dyspnea on exertion gets easily winded. Respiratory: + cough, shortness of breath, or wheezing Neurological: no TIA or stroke symptoms Dermatological: negative recent excision by derm of lesions on hand- benign ( dr hall) ENT: negative Skin : possible hives recently, ? Rxn to peanuts Gastrointestinal: no abdominal pain, change in bowel habits, or black or bloody stools, uses MOM and miralax for constipation management Genito-Urinary: no dysuria, trouble voiding, or hematuria Hematological and Lymphatic: negative Breast: negative for breast lumps Musculoskeletal: negative Remaining ROS negative.  Physical Exam: Blood pressure 178/97, pulse 76, temperature 98.5 F (36.9 C), height 5\' 4"  (1.626 m), weight 191 lb 6.4 oz (86.818 kg). ECOG: 0 General appearance: alert, cooperative and appears stated age Head: Normocephalic, without obvious abnormality, atraumatic Neck: no adenopathy, no carotid bruit, no JVD, supple, symmetrical, trachea midline  and thyroid not enlarged, symmetric, no tenderness/mass/nodules Lymph nodes: Cervical, supraclavicular, and axillary nodes normal. Cardiac : regular rate and rhythm, no murmurs or gallops Pulmonary:clear to auscultation bilaterally and normal percussion bilaterally Breasts: positive findings: s/p bilateral mastectomy, with prominent axillary skin folds and noevidence for local recurrence, s/p radiation related changes on lt side. Abdomen:soft, non-tender; bowel sounds normal; no masses,  no organomegaly Extremities negative Neuro: alert, oriented, normal speech, no focal findings or movement disorder noted  Lab Results: Lab Results  Component Value Date   WBC 8.0 11/23/2011   HGB 13.6 11/23/2011   HCT 39.5 11/23/2011   MCV 91.2 11/23/2011   PLT 295 11/23/2011     Chemistry      Component Value Date/Time   NA 141 08/29/2011 1511   K 3.9 08/29/2011 1511   CL 105 08/29/2011 1511   CO2 26 08/29/2011 1511   BUN 14 08/29/2011 1511   CREATININE 0.7 08/29/2011 1511      Component Value Date/Time   CALCIUM 10.1 08/29/2011 1511   ALKPHOS 67 08/29/2011 1511   AST 22 08/29/2011 1511   ALT 13 08/29/2011 1511   BILITOT 0.5 08/29/2011 1511      .pathology. Radiological Studies: chest X-ray n/a Mammogram n/a Bone density n/a  Impression and Plan: Pt is doing well, she is NED and will be seen in 6  Months and wll continue letrozole which she tolerates well.  Discusses exercise and wt management.  More than 50% of the visit was spent in patient-related counselling   Pierce Crane, MD 12/14/201210:21 AM

## 2011-11-24 ENCOUNTER — Telehealth: Payer: Self-pay | Admitting: Oncology

## 2011-11-24 LAB — COMPREHENSIVE METABOLIC PANEL
ALT: 15 U/L (ref 0–35)
AST: 18 U/L (ref 0–37)
Alkaline Phosphatase: 64 U/L (ref 39–117)
Creatinine, Ser: 0.68 mg/dL (ref 0.50–1.10)
Sodium: 141 mEq/L (ref 135–145)
Total Bilirubin: 0.5 mg/dL (ref 0.3–1.2)
Total Protein: 6.7 g/dL (ref 6.0–8.3)

## 2011-11-24 NOTE — Telephone Encounter (Signed)
called pt and informed her of appts for [id number]

## 2011-11-25 ENCOUNTER — Encounter: Payer: Self-pay | Admitting: Oncology

## 2011-11-25 ENCOUNTER — Other Ambulatory Visit: Payer: Self-pay | Admitting: Oncology

## 2011-11-26 ENCOUNTER — Emergency Department (INDEPENDENT_AMBULATORY_CARE_PROVIDER_SITE_OTHER)
Admission: EM | Admit: 2011-11-26 | Discharge: 2011-11-26 | Disposition: A | Discharge status: Home / Self Care | Payer: Medicare Other | Source: Home / Self Care

## 2011-11-26 ENCOUNTER — Ambulatory Visit (INDEPENDENT_AMBULATORY_CARE_PROVIDER_SITE_OTHER): Payer: Medicare Other | Admitting: Internal Medicine

## 2011-11-26 ENCOUNTER — Encounter (HOSPITAL_COMMUNITY): Payer: Self-pay

## 2011-11-26 VITALS — BP 150/78 | HR 76 | Temp 98.8°F | Ht 64.0 in | Wt 188.0 lb

## 2011-11-26 DIAGNOSIS — I10 Essential (primary) hypertension: Secondary | ICD-10-CM | POA: Insufficient documentation

## 2011-11-26 DIAGNOSIS — R030 Elevated blood-pressure reading, without diagnosis of hypertension: Secondary | ICD-10-CM

## 2011-11-26 DIAGNOSIS — E119 Type 2 diabetes mellitus without complications: Secondary | ICD-10-CM

## 2011-11-26 MED ORDER — TRIAMTERENE-HCTZ 37.5-25 MG PO TABS: 1.0000 | ORAL_TABLET | Freq: Every day | ORAL | Status: DC

## 2011-11-26 NOTE — ED Provider Notes (Signed)
History     CSN: [id number] Arrival date & time: 11/26/2011 12:07 PM   None     Chief Complaint  Patient presents with  . Hypertension    (Consider location/radiation/quality/duration/timing/severity/associated sxs/prior treatment) HPI Comments: Pt states she saw her oncologist on Friday. Her BP was initially high, but then came down. She has been checking her BP over the weekend. When she awoke this morning was 202/115. She has no previous hx of high blood pressure. She has an appt with her PCP today at 3:45 regarding her elevated BP readings. She states she has been physically feeling well but has been under emotional stress. Denies chest pain, or pressures. Has had sensation of fullness in chest where her breasts use to be, like she use to get premenstrually for the last one week and spoke to her oncologist regarding this on Friday. Pt has had bilat mastectomy. No dyspnea, HA or dizziness. She had a mild pain (1 on scale of 1-10) in back of her neck this morning and has resolved. Hx of DM - pts states BS is well controlled. See's her PCP regularly and has lab monitoring as recommended.   Patient is a 70 y.o. female presenting with hypertension. The history is provided by the patient.  Hypertension Pertinent negatives include no chest pain, no headaches and no shortness of breath.    Past Medical History  Diagnosis Date  . Diabetes mellitus   . Hyperlipidemia   . Asthma     dx in adulthood (70 y/o) PFT 09/08/09 FEV1 1.70/81%; FEV1/FVC 0.75; +resp to dilartor; NI LV/DLCO  . Breast ca     hx of bilateral diagnosied in 2006, recurred 2011- Dr Claria Dice  . Colonic polyp   . Osteopenia   . Mass of ovary     resolved  . Allergic rhinitis   . OSA (obstructive sleep apnea)     CPAP intolerant  . Peripheral neuropathy   . COPD (chronic obstructive pulmonary disease)   . Sore throat   . Visual disturbance   . Cough   . Wheezing   . Abdominal distention   . Abdominal  pain   . Constipation     Past Surgical History  Procedure Date  . Appendectomy   . Mastectomy 2006    bilateral  . Pilonidal cyst surgery     ? of    Family History  Problem Relation Age of Onset  . Breast cancer      GM,sister  . Colon cancer Neg Hx   . Diabetes Brother   . Heart attack      no h/o early diseae  . COPD Mother     smoker  . Hypertension Mother   . Cancer Father     lung    History  Substance Use Topics  . Smoking status: Former Games developer  . Smokeless tobacco: Never Used  . Alcohol Use: No    OB History    Grav Para Term Preterm Abortions TAB SAB Ect Mult Living                  Review of Systems  Constitutional: Negative for fever and chills.  Respiratory: Negative for cough, chest tightness and shortness of breath.   Cardiovascular: Negative for chest pain and leg swelling.  Neurological: Negative for light-headedness and headaches.    Allergies  Penicillins  Home Medications   Current Outpatient Rx  Name Route Sig Dispense Refill  . ALBUTEROL SULFATE 0.63 MG/3ML IN NEBU  Nebulization Take 3 mLs (0.63 mg total) by nebulization 4 (four) times daily as needed. 75 mL 5  . ALBUTEROL SULFATE HFA 108 (90 BASE) MCG/ACT IN AERS Inhalation Inhale 2 puffs into the lungs 4 (four) times daily. 6.7 g 6  . ASPIRIN 81 MG PO TABS Oral Take 81 mg by mouth daily.      . BUDESONIDE-FORMOTEROL FUMARATE 80-4.5 MCG/ACT IN AERO Inhalation Inhale 2 puffs into the lungs 2 (two) times daily. 1 Inhaler 12  . CETIRIZINE HCL 10 MG PO TABS Oral Take 10 mg by mouth daily.      Marland Kitchen CLOTRIMAZOLE-BETAMETHASONE 1-0.05 % EX CREA  Apply to affected area 2 times daily 15 g 1  . FLUTICASONE PROPIONATE 0.05 % EX CREA  Use as directed     . HYDROCORTISONE BUTYRATE 0.1 % EX OINT  Use as directed     . LETROZOLE 2.5 MG PO TABS Oral Take 1 tablet by mouth Daily.    Marland Kitchen MAGNESIUM HYDROXIDE 800 MG/5ML PO SUSP Oral Take 5 mLs by mouth every evening.      Marland Kitchen METFORMIN HCL 1000 MG PO TABS   TAKE 1 TABLET EVERY DAY 30 tablet 5  . MONTELUKAST SODIUM 10 MG PO TABS  TAKE 1 TABLET BY MOUTH EVERY DAY 30 tablet 1  . CENTRUM PO CHEW Oral Chew 1 tablet by mouth daily.      Marland Kitchen PREDNISONE 10 MG PO TABS  Taper as directed     . SIMVASTATIN 40 MG PO TABS  TAKE ONE TABLET BY MOUTH AT BEDTIME 30 tablet 5  . VALACYCLOVIR HCL 1 G PO TABS Oral Take 2 tablets by mouth Twice daily.      BP 153/102  Pulse 81  Temp(Src) 98.6 F (37 C) (Oral)  Resp 20  SpO2 97%  Physical Exam  Nursing note and vitals reviewed. Constitutional: She appears well-developed and well-nourished. No distress.  HENT:  Head: Normocephalic and atraumatic.  Right Ear: Tympanic membrane, external ear and ear canal normal.  Left Ear: Tympanic membrane, external ear and ear canal normal.  Nose: Nose normal.  Mouth/Throat: Uvula is midline, oropharynx is clear and moist and mucous membranes are normal. No oropharyngeal exudate, posterior oropharyngeal edema or posterior oropharyngeal erythema.  Neck: Neck supple.  Cardiovascular: Normal rate, regular rhythm and normal heart sounds.   Pulmonary/Chest: Effort normal and breath sounds normal. No respiratory distress.  Lymphadenopathy:    She has no cervical adenopathy.  Neurological: She is alert.  Skin: Skin is warm and dry.  Psychiatric: She has a normal mood and affect.    ED Course  Procedures (including critical care time)  Labs Reviewed - No data to display No results found.   1. Blood pressure elevated without history of HTN   2. Diabetes mellitus       MDM  Elevated BP readings in diabetic pt. No hx of HTN. Pt has appt with PCP in 2 hrs and will f/u with him.         Melody Comas, PA 11/26/11 [address] [place], [place] 11/26/11 1401

## 2011-11-26 NOTE — Patient Instructions (Signed)
Start the new medicine Check the  blood pressure daily ,be sure it is less between 110/60 and  140/85. If it is consistently higher or lower , let me know Arrange for a lab in 2 weeks (BMP--dx HTN)

## 2011-11-26 NOTE — Progress Notes (Signed)
  Subjective:    Patient ID: [name], female    DOB: 1941-03-11, 70 y.o.   MRN: [id number]  HPI Here because of elevated BP Her BP was 178/97 3 days ago at the oncology office. Today, she checked it at home with a wrist monitor and  It was ~ 200/108. She went to her pharmacist, he checked it and it was 202/115. Later on she went to urgent care, BP in the 160s / 100.  Patient concerned about her readings  Past Medical History: Diabetes mellitus, type II Peripheral neuropathy Hyperlipidemia Obesity OSA CPAP intolerant- NPSG 08/09/03- AHI 66/hr Asthma, Dx in adulthood (70y/o)                    - PFT 09/08/09 FEV1 1.70/81%; FEV1/FVC 0.75; + resp to dilator; Nl LV/ DLCO breast cancer, hx of,  BILATERAL diagnosed in 2006, recurred 2011- Dr Donnie Coffin                     Drue Flirt, XRT, Femara Colonic polyps, hx of Osteopenia Ovarian Mass, resolved Allergic rhinitis negative stress test in 2004  Past Surgical History: Appendectomy MASTECTOMY, BILATERAL, HX OF  ?pilonidal cyst surgery   Review of Systems In general feels well, denies any chest pain or shortness of breath. No headaches or palpitations. Vision slightly blurred?. She spent a week with her daughter, she had a lot of soups which were salted. She is also having some stress related to her children    Objective:   Physical Exam  Constitutional: She is oriented to person, place, and time. She appears well-developed and well-nourished. No distress.  HENT:  Head: Normocephalic and atraumatic.  Eyes: Conjunctivae and EOM are normal. Pupils are equal, round, and reactive to light.       undilated fundoscopy showed normal vessels, no edema  Cardiovascular: Normal rate, regular rhythm and normal heart sounds.   No murmur heard. Pulmonary/Chest: Effort normal and breath sounds normal. No respiratory distress. She has no wheezes. She has no rales.  Musculoskeletal: She exhibits no edema.  Neurological: She is alert and  oriented to person, place, and time.  Skin: She is not diaphoretic.      Assessment & Plan:

## 2011-11-26 NOTE — ED Notes (Signed)
Pt's BP was 168/82 checked at 1115 with [facility]'s portable vital machine by EMT F[facility].

## 2011-11-26 NOTE — ED Notes (Signed)
Patient states he BP reading has been high today , and came in to ave it evaluated; has an appt to see her primary MD at 3pm today  about her bp concerns. Spent some time w her daughter last week, and ate canned soup a lot while she was there rather than have her cook , and has some "personal concerns"; NAD, denies pain at present. States she had a brief pain in her left shoulder while driving over here, and a brief "twinge " of HA pain  (none at present)

## 2011-11-26 NOTE — Assessment & Plan Note (Addendum)
New onset of elevated BP, may be a temporary problem related to excessive sodium intake,  however her BP has been high enough that I am concerned. Will start a diuretic. She may need to be on it permanently. See instructions. Also recommend low-salt diet

## 2011-11-27 ENCOUNTER — Telehealth: Payer: Self-pay | Admitting: *Deleted

## 2011-11-27 MED ORDER — LOSARTAN POTASSIUM 50 MG PO TABS: 50.0000 mg | ORAL_TABLET | Freq: Every day | ORAL | Status: DC

## 2011-11-27 NOTE — Telephone Encounter (Signed)
Message copied by Verdene Rio on Tue Nov 27, 2011 10:22 AM ------      Message from: [name]      Created: Mon Nov 26, 2011  6:57 PM       Please check on her, BP better? Feeling ok?

## 2011-11-27 NOTE — Telephone Encounter (Signed)
Noted, ok to proceed w/losartan

## 2011-11-27 NOTE — Telephone Encounter (Signed)
Call in losartan 50 mg---> 1 po qd #30, 2 RF Call w/ BPs in 2 days ER if severe HA, CP

## 2011-11-27 NOTE — Telephone Encounter (Signed)
Discuss with patient, Rx sent. Pt states that after eating and taking meds BP was 149/89.

## 2011-11-27 NOTE — Telephone Encounter (Signed)
Pt states that BP 210/111 today. Pt c/o headache on and off but feels fine other than that. Pt denies any sob, no chest, numbness or tingling in arms or legs, dizziness, blurred vision.

## 2011-11-30 ENCOUNTER — Other Ambulatory Visit: Payer: Medicare Other | Admitting: Lab

## 2011-11-30 ENCOUNTER — Telehealth: Payer: Self-pay | Admitting: Internal Medicine

## 2011-11-30 NOTE — Telephone Encounter (Signed)
Discuss with patient  

## 2011-11-30 NOTE — Telephone Encounter (Signed)
Patient was to call back after taking losartan potassium - triam / hctz several days bp is 104/71- 125/95 after activity - 158/100 before med -- 125/79 after med

## 2011-11-30 NOTE — Telephone Encounter (Signed)
Readings are better. Continue with same meds.

## 2011-12-01 NOTE — ED Provider Notes (Signed)
Medical screening examination/treatment/procedure(s) were performed by non-physician practitioner and as supervising physician I was immediately available for consultation/collaboration.  LANEY,RONNIE   Ronnie Laney, MD 12/01/11 0937 

## 2011-12-10 ENCOUNTER — Other Ambulatory Visit: Payer: Self-pay | Admitting: Internal Medicine

## 2011-12-10 DIAGNOSIS — I10 Essential (primary) hypertension: Secondary | ICD-10-CM

## 2011-12-12 ENCOUNTER — Other Ambulatory Visit: Payer: Self-pay | Admitting: Internal Medicine

## 2011-12-12 ENCOUNTER — Other Ambulatory Visit (INDEPENDENT_AMBULATORY_CARE_PROVIDER_SITE_OTHER): Payer: Medicare Other

## 2011-12-12 DIAGNOSIS — I10 Essential (primary) hypertension: Secondary | ICD-10-CM

## 2011-12-12 LAB — BASIC METABOLIC PANEL
BUN: 32 mg/dL — ABNORMAL HIGH (ref 6–23)
CO2: 32 mEq/L (ref 19–32)
Chloride: 93 mEq/L — ABNORMAL LOW (ref 96–112)
Creatinine, Ser: 1.6 mg/dL — ABNORMAL HIGH (ref 0.4–1.2)
Potassium: 3.8 mEq/L (ref 3.5–5.1)

## 2011-12-17 ENCOUNTER — Telehealth: Payer: Self-pay | Admitting: *Deleted

## 2011-12-17 MED ORDER — AMLODIPINE BESYLATE 10 MG PO TABS: 10.0000 mg | ORAL_TABLET | Freq: Every day | ORAL | Status: DC

## 2011-12-17 NOTE — Telephone Encounter (Signed)
Discuss with patient, Rx sent. 

## 2011-12-17 NOTE — Telephone Encounter (Signed)
Message copied by Verdene Rio on Mon Dec 17, 2011 10:46 AM ------      Message from: [name] E      Created: Fri Dec 14, 2011  2:53 PM       started losartan 11/27/2011, creatinine previously normal is now 1.6.      Need to stop losartan today      Start amlodipine 10 mg one daily, #30, 1 refill. Watch for lower extremity edema.      Office visit here in 2 weeks

## 2011-12-19 ENCOUNTER — Telehealth: Payer: Self-pay | Admitting: *Deleted

## 2011-12-19 NOTE — Telephone Encounter (Signed)
She recently discontinued losartan, is possible that her BP started to increase soon.  since she is concerned about this , start amlodipine only if the BP is consistently more than 135/85. Leave amlodipine for now on her medication list

## 2011-12-19 NOTE — Telephone Encounter (Signed)
Pt reports that her BP is 111/60 and she has concerns about adding the Amlodipine to her medication regimen [with Maxide-25], worrying that it will lower her BP more; and would like assurance from MD that it is Ok to take the Amlodipine. Please advise.

## 2011-12-19 NOTE — Telephone Encounter (Signed)
I spoke w/pt - she understands and had no further questions

## 2012-01-01 ENCOUNTER — Telehealth: Payer: Self-pay | Admitting: *Deleted

## 2012-01-01 NOTE — Telephone Encounter (Signed)
Pt left VM that she is confused why a Rx was sent in to pharmacy for losartan if she is not longer to take this med. Advise Pt that no new Rx for this med have been sent in but it looks like when it was originally filled it had refill and if she is on auto refill at pharmacy that it why they advised her Rx was ready. Pt ok info and re-advise Pt on how and when she needed to add Norvasc and what meds she is to take on a daily basis. Pt ok info and verbalized understanding.

## 2012-01-03 ENCOUNTER — Encounter: Payer: Self-pay | Admitting: Internal Medicine

## 2012-01-03 ENCOUNTER — Ambulatory Visit (INDEPENDENT_AMBULATORY_CARE_PROVIDER_SITE_OTHER): Payer: Medicare Other | Admitting: Internal Medicine

## 2012-01-03 VITALS — BP 140/88 | HR 78 | Temp 98.3°F | Wt 180.0 lb

## 2012-01-03 DIAGNOSIS — E119 Type 2 diabetes mellitus without complications: Secondary | ICD-10-CM

## 2012-01-03 DIAGNOSIS — I10 Essential (primary) hypertension: Secondary | ICD-10-CM

## 2012-01-03 LAB — BASIC METABOLIC PANEL
BUN: 20 mg/dL (ref 6–23)
CO2: 29 mEq/L (ref 19–32)
Calcium: 10.5 mg/dL (ref 8.4–10.5)
Chloride: 99 mEq/L (ref 96–112)
Creatinine, Ser: 1.2 mg/dL (ref 0.4–1.2)
GFR: 60.11 mL/min (ref >60.00)
Glucose, Bld: 104 mg/dL — ABNORMAL HIGH (ref 70–99)
Potassium: 3.7 mEq/L (ref 3.5–5.1)
Sodium: 139 mEq/L (ref 135–145)

## 2012-01-03 LAB — HEMOGLOBIN A1C: Hgb A1c MFr Bld: 6.6 % — ABNORMAL HIGH (ref 4.6–6.5)

## 2012-01-03 NOTE — Patient Instructions (Signed)
Check the  blood pressure 4 to 5  times a week, be sure it is between 110/65 and  140/85. If it is consistently higher or lower, let me know

## 2012-01-03 NOTE — Progress Notes (Signed)
  Subjective:    Patient ID: [name], female    DOB: [birth year], 71 y.o.   MRN: [id number]  HPI Followup in reference to hypertension  Past Medical History: HTN dx 11-2011 Diabetes mellitus, type II  Peripheral neuropathy  Hyperlipidemia  Obesity  OSA CPAP intolerant- NPSG 08/09/03- AHI 66/hr  Asthma, Dx in adulthood (71y/o)  - PFT 09/08/09 FEV1 1.70/81%; FEV1/FVC 0.75; + resp to dilator; Nl LV/ DLCO  breast cancer, hx of, BILATERAL diagnosed in 2006, recurred 2011- Dr Donnie Coffin  Drue Flirt, XRT, Femara  Colonic polyps, hx of  Osteopenia  Ovarian Mass, resolved  Allergic rhinitis  negative stress test in 2004   Past Surgical History:  Appendectomy  MASTECTOMY, BILATERAL, HX OF  ?pilonidal cyst surgery   Review of Systems Feeling well, ambulatory blood pressures ranged from 99/66-115/72. She is currently only taking a diuretic. Also was discontinued due to elevated creatinine. The amlodipine was also discontinued. Denies chest pain or shortness of breath No leg cramps Doing very well with a healthy diet, low-salt diet. She's not taking any NSAIDs.    Objective:   Physical Exam  Constitutional: She appears well-developed and well-nourished. No distress.  Cardiovascular: Normal rate, regular rhythm and normal heart sounds.   No murmur heard. Pulmonary/Chest: Effort normal and breath sounds normal. No respiratory distress. She has no wheezes. She has no rales.  Skin: She is not diaphoretic.       Assessment & Plan:

## 2012-01-03 NOTE — Assessment & Plan Note (Addendum)
Due for labs

## 2012-01-03 NOTE — Assessment & Plan Note (Addendum)
creatinine was elevated after she started losartan. She took  amlodipine temporarily, not taking it  anymore. No side effects. BP currently well controlled with a diuretic. In the future and if needed, we could try again a low dose of ARB or amlodipine. Plan: No change Labs

## 2012-01-08 ENCOUNTER — Encounter: Payer: Self-pay | Admitting: Internal Medicine

## 2012-01-15 ENCOUNTER — Other Ambulatory Visit: Payer: Self-pay

## 2012-01-15 DIAGNOSIS — C50919 Malignant neoplasm of unspecified site of unspecified female breast: Secondary | ICD-10-CM

## 2012-01-15 MED ORDER — LETROZOLE 2.5 MG PO TABS: 2.5000 mg | ORAL_TABLET | Freq: Every day | ORAL | Status: DC

## 2012-03-22 ENCOUNTER — Other Ambulatory Visit: Payer: Self-pay | Admitting: Internal Medicine

## 2012-03-24 NOTE — Telephone Encounter (Signed)
Refill done.  

## 2012-04-04 ENCOUNTER — Ambulatory Visit (INDEPENDENT_AMBULATORY_CARE_PROVIDER_SITE_OTHER): Payer: Medicare Other | Admitting: Internal Medicine

## 2012-04-04 ENCOUNTER — Telehealth: Payer: Self-pay | Admitting: Internal Medicine

## 2012-04-04 VITALS — BP 118/76 | HR 84 | Temp 98.2°F | Wt 176.0 lb

## 2012-04-04 DIAGNOSIS — E119 Type 2 diabetes mellitus without complications: Secondary | ICD-10-CM

## 2012-04-04 DIAGNOSIS — T7840XA Allergy, unspecified, initial encounter: Secondary | ICD-10-CM

## 2012-04-04 LAB — HEMOGLOBIN A1C: Hgb A1c MFr Bld: 6.4 % (ref 4.6–6.5)

## 2012-04-04 MED ORDER — DOXYCYCLINE HYCLATE 100 MG PO TABS: 100.0000 mg | ORAL_TABLET | Freq: Two times a day (BID) | ORAL | Status: DC

## 2012-04-04 NOTE — Assessment & Plan Note (Signed)
Just developed a rash that fortunately is responding well to Benadryl. Patient suspect that is related to nuts----> see instructions.

## 2012-04-04 NOTE — Patient Instructions (Signed)
Avoid nuts Fluticasone to the lip  If  the right arm infection is not better, see dermatology or let me know Take  Benadryl for the next few days as needed

## 2012-04-04 NOTE — Progress Notes (Signed)
  Subjective:    Patient ID: [name], female    DOB: Dec 09, 1941, 71 y.o.   MRN: [id number]  HPI Acute visit Yesterday she developed swelling and itching at the right hand, right axillary area and lower lip. She started Benadryl 4 times a day, she is better. She now realizes that she ate nuts not long ago and suspect this is a reaction to nut, similar sx before w/ exposure.  Also, has a sore on the right forearm for 3 weeks, started as a pimple.  As far as her diabetes, does not take blood sugars because they're always normal. Due for a A1c  Past Medical History:  HTN dx 11-2011  Diabetes mellitus, type II  Peripheral neuropathy  Hyperlipidemia  Obesity  OSA CPAP intolerant- NPSG 08/09/03- AHI 66/hr  Asthma, Dx in adulthood (71y/o)  - PFT 09/08/09 FEV1 1.70/81%; FEV1/FVC 0.75; + resp to dilator; Nl LV/ DLCO  breast cancer, hx of, BILATERAL diagnosed in 2006, recurred 2011- Dr Donnie Coffin  Drue Flirt, XRT, Femara  Colonic polyps, hx of  Osteopenia  Ovarian Mass, resolved  Allergic rhinitis  negative stress test in 2004  Past Surgical History:  Appendectomy  MASTECTOMY, BILATERAL, HX OF  ?pilonidal cyst surgery  Review of Systems Denies fever or chills No tonge swelling. No shortness of breath, no generalized rash or itching. Denies chest pain, nausea, vomiting or diarrhea.    Objective:   Physical Exam  Musculoskeletal:       Arms:   General -- alert, well-developed, and overweight appearing. No apparent distress.  HEENT -- lower lip by the swelling, tongue seems normal, no difficulty breathing or swallowing. Extremities-- R hand which have very subtle edema, no redness. Neurologic-- alert & oriented X3 and strength normal in all extremities. Psych-- Cognition and judgment appear intact. Alert and cooperative with normal attention span and concentration.  not anxious appearing and not depressed appearing.       Assessment & Plan:   Skin lesion, right arm, suspect  staph infection. See instructions, recommend doxycycline.

## 2012-04-04 NOTE — Telephone Encounter (Signed)
Caller: Gwen/Patient; PCP: [name]; CB#: [phone]; ; ; Call regarding Rash/Hives;Afebrile. Onset- 04/03/12 Pt has swelling of her hand, rash on her upper arm and swelling and itching of her lips.  She has been using Benadryl. Emergent s/s of Allergic Reaction r/o. Pt to see provider within 24 hrs. Appt scheduled for today at 1:45pm with Dr. Drue Novel by Earl Many, RN.

## 2012-04-04 NOTE — Assessment & Plan Note (Signed)
No recent ambulatory blood sugars because "they are always normal". Labs

## 2012-04-07 ENCOUNTER — Encounter: Payer: Self-pay | Admitting: Internal Medicine

## 2012-04-07 ENCOUNTER — Encounter: Payer: Self-pay | Admitting: *Deleted

## 2012-04-09 ENCOUNTER — Encounter: Payer: Self-pay | Admitting: *Deleted

## 2012-04-12 ENCOUNTER — Other Ambulatory Visit: Payer: Self-pay | Admitting: Internal Medicine

## 2012-04-14 NOTE — Telephone Encounter (Signed)
Refill done.  

## 2012-04-15 ENCOUNTER — Encounter: Payer: Self-pay | Admitting: Internal Medicine

## 2012-04-15 ENCOUNTER — Ambulatory Visit: Payer: Medicare Other | Admitting: Internal Medicine

## 2012-04-15 ENCOUNTER — Ambulatory Visit (INDEPENDENT_AMBULATORY_CARE_PROVIDER_SITE_OTHER): Payer: Medicare Other | Admitting: Internal Medicine

## 2012-04-15 ENCOUNTER — Other Ambulatory Visit: Payer: Self-pay | Admitting: Internal Medicine

## 2012-04-15 ENCOUNTER — Other Ambulatory Visit (INDEPENDENT_AMBULATORY_CARE_PROVIDER_SITE_OTHER): Payer: Medicare Other

## 2012-04-15 VITALS — BP 142/80 | HR 72 | Ht 64.0 in | Wt 178.2 lb

## 2012-04-15 DIAGNOSIS — Z91018 Allergy to other foods: Secondary | ICD-10-CM

## 2012-04-15 DIAGNOSIS — J45909 Unspecified asthma, uncomplicated: Secondary | ICD-10-CM

## 2012-04-15 DIAGNOSIS — L509 Urticaria, unspecified: Secondary | ICD-10-CM

## 2012-04-15 DIAGNOSIS — T781XXA Other adverse food reactions, not elsewhere classified, initial encounter: Secondary | ICD-10-CM

## 2012-04-15 DIAGNOSIS — J45998 Other asthma: Secondary | ICD-10-CM

## 2012-04-15 LAB — CBC WITH DIFFERENTIAL/PLATELET
Eosinophils Relative: 4.5 % (ref 0.0–5.0)
Monocytes Relative: 7.4 % (ref 3.0–12.0)
Neutrophils Relative %: 72.8 % (ref 43.0–77.0)
Platelets: 344 10*3/uL (ref 150.0–400.0)
WBC: 8.9 10*3/uL (ref 4.5–10.5)

## 2012-04-15 MED ORDER — BUDESONIDE-FORMOTEROL FUMARATE 160-4.5 MCG/ACT IN AERO: 2.0000 | INHALATION_SPRAY | Freq: Two times a day (BID) | RESPIRATORY_TRACT | Status: DC

## 2012-04-15 NOTE — Patient Instructions (Signed)
Order- lab- CBC w/ diff, Allergy profile and Food Allergy IgE profile    Dx food allergy  Sample/ Script Symbicort 160    2 puffs then rinse mouth well, twice daily        As you feel better controlled, you can continue this or try going back to the Symbicort 80  Suggest a daily antihistamine for now- Claritin/ loratadine, or Allegra/ fexofenadine

## 2012-04-15 NOTE — Progress Notes (Signed)
Patient ID: [name], female    DOB: 25-Sep-1941, 71 y.o.   MRN: [id number]  HPI 07/16/11- 57 yoF former smoker followed for OSA/ failed CPAP, asthma, allergic rhinitis complicated by DM, hx breast cancer Blames heat for need to resume using her rescue inhaler..  She associates purple areas on lips from using her nebulizer machine- ? Hard enough to bruise? She couldn't tolerate and stopped using CPAP. We reviewed symptoms and warning signs again.  She has had spots on hands, pruritic, " inconclusive " on biopsy by dermatologist, Dr Margo Aye. Admits night sweats, cough, scant yellow phlegm.  Last CXR 12/27/10 f/u for recurrent breast cancer- left.    10/16/11- 89 yoF former smoker followed for OSA/ failed CPAP, asthma, allergic rhinitis complicated by DM, hx breast cancer She reports an urticarial reaction to peanuts 2 months ago and is unsure if she has had a reaction in the past 2 chocolate. She would like to look at food allergy testing so she knows better to talk to watch out for. We discussed food allergy versus intolerance.  She is taking prednisone and valacyclovir for a hyperpigmented rash on her hands, after bx by dermatologist. Possibly viral warts. Allergy symptoms do better in cold weather but will also improve on her steroids. Cough has been drier with scant clear sputum. She has not needed her nebulizer machine at all.  Chest x-ray: 07/16/2011-NAD, mild CE, bronchitis changes, arthritis in spine.   04/15/12- 70 yoF former smoker followed for OSA/ failed CPAP, asthma, allergic rhinitis, Food Allergy complicated by DM, hx breast cancer Had 2 chocolate chip cookies yesterday-started itching and swelling; did not go to ER; still itching today. On May 6 she 82 chocolate chip cookies, not recognized contained nuts. 4-5 hours later she developed facial swelling/angioedema with some itching but no wheeze. She took Benadryl. She continues to have some swelling in the left side of her  face now 24 hours later. She never had any increased wheeze throat swelling or other airway discomfort. She has noticed increased wheeze and dry cough over the last 2 months, responsive to her rescue inhaler. Additional problem of rash on her arms which begins like pimples but develops a hyperpigmented area about 3 cm diameter which response to steroid cream. Dermatologist did biopsy. Results nonspecific. No relation noted between this rash and her other health problems, exposures or other triggers.  Review of Systems-See HPI Constitutional:   No-   weight loss, night sweats, fevers, chills, fatigue, lassitude. HEENT:   No-  headaches, difficulty swallowing, tooth/dental problems, sore throat,       No-  sneezing, +itching, ear ache, nasal congestion, post nasal drip,  CV:  No-   chest pain, orthopnea, PND, swelling in lower extremities, anasarca, dizziness, palpitations Resp: No-   shortness of breath with exertion or at rest.              No-   productive cough,  + non-productive cough,  No- coughing up of blood.              No-   change in color of mucus.  + wheezing.   Skin: No-   rash or lesions. GI:  No-   heartburn, indigestion, abdominal pain, nausea, vomiting, GU:  MS:  No-   joint pain or swelling. . Neuro-     nothing unusual Psych:  No- change in mood or affect. No depression or anxiety.  No memory loss.   Objective:   Physical  Exam General- Alert, Oriented, Affect-appropriate, Distress- none acute    Overweight. Left facial edema. Skin- small isolated slightly raised lesions on dorsum right hand, between finger left hand. Purple areas on lips.  Lymphadenopathy- none Head- atraumatic            Eyes- Gross vision intact, PERRLA, conjunctivae clear secretions            Ears- Hearing, canals normal            Nose- Clear, no-Septal dev, mucus, polyps, erosion, perforation             Throat- Mallampati III , mucosa clear , drainage- none, tonsils- atrophic Neck- flexible ,  trachea midline, no stridor , thyroid nl, carotid no bruit Chest - symmetrical excursion , unlabored           Heart/CV- RRR , no murmur , no gallop  , no rub, nl s1 s2                           - JVD- none , edema- none, stasis changes- none, varices- none           Lung- + trace wheeze , +dry cough,  dullness-none, rub- none           Chest wall-  Abd-  Br/ Gen/ Rectal- Not done, not indicated Extrem- cyanosis- none, clubbing, none, atrophy- none, strength- nl Neuro- grossly intact to observation

## 2012-04-16 LAB — ALLERGY FULL AND FOOD SPECIFIC PROFILE
Allergen, D pternoyssinus,d7: 0.29 kU/L (ref <0.35)
Allergen,Goose feathers, e70: <0.1 kU/L (ref <0.35)
Aspergillus fumigatus, IgG: 0.12 kU/L (ref <0.35)
Bermuda Grass: <0.1 kU/L (ref <0.35)
Candida Albicans: <0.1 kU/L (ref <0.35)
Common Ragweed: 0.12 kU/L (ref <0.35)
Corn: <0.1 kU/L (ref <0.35)
D. farinae: 0.25 kU/L (ref <0.35)
Dog Dander: <0.1 kU/L (ref <0.35)
Fish Cod: <0.1 kU/L (ref <0.35)
G005 Rye, Perennial: 0.12 kU/L (ref <0.35)
Goldenrod: <0.1 kU/L (ref <0.35)
Helminthosporium halodes: 0.41 kU/L — ABNORMAL HIGH (ref <0.35)
House Dust Hollister: <0.1 kU/L (ref <0.35)
IgE (Immunoglobulin E), Serum: 75.5 IU/mL (ref 0.0–180.0)
Milk IgE: <0.1 kU/L (ref <0.35)
Plantain: <0.1 kU/L (ref <0.35)
Shrimp IgE: <0.1 kU/L (ref <0.35)
Stemphylium Botryosum: 0.29 kU/L (ref <0.35)
Tuna IgE: <0.1 kU/L (ref <0.35)

## 2012-04-20 NOTE — Assessment & Plan Note (Signed)
Increased wheezing over the last 2 months, consistent with response to current spring allergy season. No exacerbation identified as associated with food exposures. Plan-increase Symbicort to 160 with sample and prescription.

## 2012-04-20 NOTE — Assessment & Plan Note (Signed)
Not clear whether unrecognized knots were in the cooking she ate, or she reacted to the chocolate or some other trigger. Plan-food allergy profile

## 2012-04-23 ENCOUNTER — Ambulatory Visit (INDEPENDENT_AMBULATORY_CARE_PROVIDER_SITE_OTHER): Payer: Medicare Other | Admitting: General Surgery

## 2012-04-23 ENCOUNTER — Encounter (INDEPENDENT_AMBULATORY_CARE_PROVIDER_SITE_OTHER): Payer: Self-pay | Admitting: General Surgery

## 2012-04-23 VITALS — BP 116/74 | HR 64 | Temp 98.2°F | Resp 14 | Ht 64.0 in | Wt 178.6 lb

## 2012-04-23 DIAGNOSIS — Z853 Personal history of malignant neoplasm of breast: Secondary | ICD-10-CM

## 2012-04-23 NOTE — Progress Notes (Signed)
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 She has a history of an axillary skin recurrence in 2011 status post excision and radiation and now on adjuvant letrazole. She reports no problems in terms of her chest wall or skin or arm swelling. She states she has had 3 episodes of pain in her back that radiates through to her chest and epigastrium and up to her jaw. This is burning and pressure pain. It is unrelated to exertion and is relieved by belching or sometimes by eating.  Exam: General: Appears well Skin: No rash or infection HEENT: No palpable masses or thyromegaly. Sclera nonicteric Lymph nodes: No cervical, subclavicular, or axillary nodes palpable Lungs: Clear and equal breath sounds bilaterally Cardiac: Regular rate and rhythm no murmurs no edema Breasts: Status post bilateral mastectomy. Mild postradiation changes on the left. There are no skin changes or subcutaneous nodules to suggest recurrence.  Assessment and plan: Doing well with no evidence of recurrent breast cancer.  She has had several episodes of back and chest pain which sounded GI origin to me. Possible hiatal hernia. With recurrent problems I told her she should discuss this with Dr. Drue Novel to see if a GI workup would be in order. I doubt this is cardiac from history.

## 2012-04-23 NOTE — Patient Instructions (Signed)
Contact Dr Drue Novel regarding back/chest pain to see if GI or cardiac workup neded

## 2012-04-23 NOTE — Progress Notes (Signed)
Quick Note:  Pt aware of results and next OV with CY. ______ 

## 2012-05-16 ENCOUNTER — Other Ambulatory Visit (HOSPITAL_BASED_OUTPATIENT_CLINIC_OR_DEPARTMENT_OTHER): Payer: Medicare Other | Admitting: Lab

## 2012-05-16 DIAGNOSIS — Z853 Personal history of malignant neoplasm of breast: Secondary | ICD-10-CM

## 2012-05-16 DIAGNOSIS — M949 Disorder of cartilage, unspecified: Secondary | ICD-10-CM

## 2012-05-16 DIAGNOSIS — E559 Vitamin D deficiency, unspecified: Secondary | ICD-10-CM

## 2012-05-16 DIAGNOSIS — M899 Disorder of bone, unspecified: Secondary | ICD-10-CM

## 2012-05-16 DIAGNOSIS — C50919 Malignant neoplasm of unspecified site of unspecified female breast: Secondary | ICD-10-CM

## 2012-05-16 LAB — CBC WITH DIFFERENTIAL/PLATELET
BASO%: 0.5 % (ref 0.0–2.0)
EOS%: 5.2 % (ref 0.0–7.0)
MCH: 30.4 pg (ref 25.1–34.0)
MCHC: 34.5 g/dL (ref 31.5–36.0)
RBC: 4.34 10*6/uL (ref 3.70–5.45)
RDW: 13.1 % (ref 11.2–14.5)
lymph#: 1.3 10*3/uL (ref 0.9–3.3)
nRBC: 0 % (ref 0–0)

## 2012-05-17 LAB — COMPREHENSIVE METABOLIC PANEL
AST: 17 U/L (ref 0–37)
Albumin: 4.5 g/dL (ref 3.5–5.2)
Alkaline Phosphatase: 62 U/L (ref 39–117)
BUN: 17 mg/dL (ref 6–23)
Potassium: 3.9 mEq/L (ref 3.5–5.3)
Sodium: 140 mEq/L (ref 135–145)
Total Protein: 7 g/dL (ref 6.0–8.3)

## 2012-05-23 ENCOUNTER — Telehealth: Payer: Self-pay | Admitting: *Deleted

## 2012-05-23 ENCOUNTER — Other Ambulatory Visit: Payer: Medicare Other

## 2012-05-23 ENCOUNTER — Ambulatory Visit (HOSPITAL_BASED_OUTPATIENT_CLINIC_OR_DEPARTMENT_OTHER): Payer: Medicare Other | Admitting: Oncology

## 2012-05-23 VITALS — BP 148/88 | HR 73 | Temp 98.3°F | Ht 64.0 in | Wt 181.1 lb

## 2012-05-23 DIAGNOSIS — E559 Vitamin D deficiency, unspecified: Secondary | ICD-10-CM

## 2012-05-23 DIAGNOSIS — D0590 Unspecified type of carcinoma in situ of unspecified breast: Secondary | ICD-10-CM

## 2012-05-23 DIAGNOSIS — C50919 Malignant neoplasm of unspecified site of unspecified female breast: Secondary | ICD-10-CM

## 2012-05-23 NOTE — Telephone Encounter (Signed)
gave patient appointment 11-2012 printed out calendar and gave to the patient

## 2012-05-23 NOTE — Progress Notes (Signed)
Hematology and Oncology Follow Up Visit  [name] [id number] [birth year] 71 y.o. 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 Lt breast cancer, invasive cancer,  largest focus T1 tubular type cancer. Recurrence in lt axillary fold 2011, s/p reexcision , xrt and now on letrozole. Bone density has been stable  Interim History: She is doing well. She is thought to have more allergies to other foods.  Medications: I have reviewed the patient's current medications.  Allergies:  Allergies  Allergen Reactions  . Food     nuts  . Penicillins Other (See Comments)    REACTION: outer rectal itching    Past Medical History, Surgical history, Social history, and Family History were reviewed and updated.  Review of Systems: Constitutional:  Negative for fever, chills, night sweats, anorexia, weight loss, pain. Cardiovascular: no chest pain , dyspnea on exertion gets easily winded. Respiratory: + cough, shortness of breath, or wheezing Neurological: no TIA or stroke symptoms Dermatological: negative ENT: negative Skin : possible hives recently, ? Rxn to peanuts Gastrointestinal: no abdominal pain, change in bowel habits, or black or bloody stools, uses MOM and miralax for constipation management Genito-Urinary: no dysuria, trouble voiding, or hematuria Hematological and Lymphatic: negative Breast: negative for breast lumps Musculoskeletal: negative Remaining ROS negative.  Physical Exam: Blood pressure 148/88, pulse 73, temperature 98.3 F (36.8 C), height 5\' 4"  (1.626 m), weight 181 lb 1.6 oz (82.146 kg). ECOG: 0 General appearance: alert, cooperative and appears stated age Head: Normocephalic, without obvious abnormality, atraumatic Neck: no adenopathy, no carotid bruit, no JVD, supple, symmetrical, trachea midline and thyroid not enlarged, symmetric, no  tenderness/mass/nodules Lymph nodes: Cervical, supraclavicular, and axillary nodes normal. Cardiac : regular rate and rhythm, no murmurs or gallops Pulmonary:clear to auscultation bilaterally and normal percussion bilaterally Breasts: positive findings: s/p bilateral mastectomy, with prominent axillary skin folds and noevidence for local recurrence, s/p radiation related changes on lt side. Abdomen:soft, non-tender; bowel sounds normal; no masses,  no organomegaly Extremities negative Neuro: alert, oriented, normal speech, no focal findings or movement disorder noted  Lab Results: Lab Results  Component Value Date   WBC 8.6 05/16/2012   HGB 13.2 05/16/2012   HCT 38.3 05/16/2012   MCV 88.2 05/16/2012   PLT 276 05/16/2012     Chemistry      Component Value Date/Time   NA 140 05/16/2012 1336   K 3.9 05/16/2012 1336   CL 102 05/16/2012 1336   CO2 26 05/16/2012 1336   BUN 17 05/16/2012 1336   CREATININE 0.91 05/16/2012 1336      Component Value Date/Time   CALCIUM 10.0 05/16/2012 1336   ALKPHOS 62 05/16/2012 1336   AST 17 05/16/2012 1336   ALT 14 05/16/2012 1336   BILITOT 0.5 05/16/2012 1336      .pathology. Radiological Studies: chest X-ray n/a Mammogram n/a Bone density due  Impression and Plan: Pt is doing well, she is NED and will be seen in 6  Months and wll continue letrozole which she tolerates well.  Discusses exercise and wt management.  More than 50% of the visit was spent in patient-related counselling   Pierce Crane, MD 6/14/201310:05 AM

## 2012-06-09 ENCOUNTER — Emergency Department (INDEPENDENT_AMBULATORY_CARE_PROVIDER_SITE_OTHER)
Admission: EM | Admit: 2012-06-09 | Discharge: 2012-06-09 | Disposition: A | Discharge status: Home / Self Care | Payer: Medicare Other | Source: Home / Self Care | Attending: Emergency Medicine | Admitting: Emergency Medicine

## 2012-06-09 ENCOUNTER — Emergency Department (HOSPITAL_COMMUNITY)
Admission: EM | Admit: 2012-06-09 | Discharge: 2012-06-09 | Disposition: A | Discharge status: Home / Self Care | Payer: Medicare Other | Attending: Emergency Medicine | Admitting: Emergency Medicine

## 2012-06-09 ENCOUNTER — Encounter (HOSPITAL_COMMUNITY): Payer: Self-pay | Admitting: Emergency Medicine

## 2012-06-09 ENCOUNTER — Encounter (HOSPITAL_COMMUNITY): Payer: Self-pay

## 2012-06-09 DIAGNOSIS — E119 Type 2 diabetes mellitus without complications: Secondary | ICD-10-CM | POA: Insufficient documentation

## 2012-06-09 DIAGNOSIS — R1310 Dysphagia, unspecified: Secondary | ICD-10-CM | POA: Insufficient documentation

## 2012-06-09 DIAGNOSIS — J4489 Other specified chronic obstructive pulmonary disease: Secondary | ICD-10-CM | POA: Insufficient documentation

## 2012-06-09 DIAGNOSIS — X58XXXA Exposure to other specified factors, initial encounter: Secondary | ICD-10-CM | POA: Insufficient documentation

## 2012-06-09 DIAGNOSIS — J449 Chronic obstructive pulmonary disease, unspecified: Secondary | ICD-10-CM | POA: Insufficient documentation

## 2012-06-09 DIAGNOSIS — T783XXA Angioneurotic edema, initial encounter: Secondary | ICD-10-CM | POA: Insufficient documentation

## 2012-06-09 DIAGNOSIS — G4733 Obstructive sleep apnea (adult) (pediatric): Secondary | ICD-10-CM | POA: Insufficient documentation

## 2012-06-09 DIAGNOSIS — Z7982 Long term (current) use of aspirin: Secondary | ICD-10-CM | POA: Insufficient documentation

## 2012-06-09 DIAGNOSIS — Z79899 Other long term (current) drug therapy: Secondary | ICD-10-CM | POA: Insufficient documentation

## 2012-06-09 DIAGNOSIS — Z853 Personal history of malignant neoplasm of breast: Secondary | ICD-10-CM | POA: Insufficient documentation

## 2012-06-09 DIAGNOSIS — E785 Hyperlipidemia, unspecified: Secondary | ICD-10-CM | POA: Insufficient documentation

## 2012-06-09 DIAGNOSIS — R220 Localized swelling, mass and lump, head: Secondary | ICD-10-CM | POA: Insufficient documentation

## 2012-06-09 DIAGNOSIS — J392 Other diseases of pharynx: Secondary | ICD-10-CM

## 2012-06-09 DIAGNOSIS — R221 Localized swelling, mass and lump, neck: Secondary | ICD-10-CM | POA: Insufficient documentation

## 2012-06-09 MED ORDER — DIPHENHYDRAMINE HCL 50 MG/ML IJ SOLN
50.0000 mg | Freq: Once | INTRAMUSCULAR | Status: AC
  Administered 2012-06-09: 50 mg via INTRAMUSCULAR

## 2012-06-09 MED ORDER — DIPHENHYDRAMINE HCL 50 MG/ML IJ SOLN
INTRAMUSCULAR | Status: AC
  Filled 2012-06-09: qty 1

## 2012-06-09 MED ORDER — FAMOTIDINE 20 MG PO TABS: 20.0000 mg | ORAL_TABLET | Freq: Two times a day (BID) | ORAL | Status: DC

## 2012-06-09 MED ORDER — DIPHENHYDRAMINE HCL 25 MG PO TABS: 50.0000 mg | ORAL_TABLET | ORAL | Status: DC | PRN

## 2012-06-09 MED ORDER — METHYLPREDNISOLONE SODIUM SUCC 125 MG IJ SOLR
INTRAMUSCULAR | Status: AC
  Filled 2012-06-09: qty 2

## 2012-06-09 MED ORDER — LORATADINE 10 MG PO TABS: 10.0000 mg | ORAL_TABLET | Freq: Every day | ORAL | Status: DC

## 2012-06-09 MED ORDER — METHYLPREDNISOLONE SODIUM SUCC 125 MG IJ SOLR
125.0000 mg | Freq: Once | INTRAMUSCULAR | Status: AC
  Administered 2012-06-09: 125 mg via INTRAMUSCULAR

## 2012-06-09 MED ORDER — PREDNISONE 20 MG PO TABS: ORAL_TABLET | ORAL | Status: DC

## 2012-06-09 NOTE — ED Notes (Signed)
Pt sent from [facility] for further eval and observation for pharyngeal edema on left side with acute odynophagia; pt being seen by PCP and allergist currently for similar; pt given benadryl and solu-medrol

## 2012-06-09 NOTE — ED Notes (Signed)
C/o onset this AM after breakfast; noted posterior pharyngeal edema, voice thick

## 2012-06-09 NOTE — ED Notes (Signed)
Pt presented to ED with tenderness and swollen neck when swallowing.No other complaints.

## 2012-06-09 NOTE — ED Provider Notes (Signed)
History     CSN: [id number]  Arrival date & time 06/09/12  1227   First MD Initiated Contact with Patient 06/09/12 1248      Chief Complaint  Patient presents with  . Allergic Reaction    (Consider location/radiation/quality/duration/timing/severity/associated sxs/prior treatment) HPI Comments: Patient presents to urgent care this morning with severe discomfort when she swallows of sudden onset patient describes that after she had breakfast this morning she started noticing that her throat became swollen and she started having difficulty swallowing and her voice became horsed".  She denies any difficulty breathing percent no any rashes, or facial swelling, patient denies any tongue swelling or lip swelling. Goes into describing how she was referred to see an allergist and she's been having multiple episodes of allergic reactions with facial swelling and rashes.  "It hurts to swallow numb having problems swallowing even my own saliva".  Patient is a 71 y.o. female presenting with allergic reaction. The history is provided by the patient.  Allergic Reaction The primary symptoms are  angioedema. The primary symptoms do not include wheezing, shortness of breath, nausea, vomiting, palpitations, rash or urticaria. The current episode started 1 to 2 hours ago. The problem has been rapidly worsening. This is a new problem.  The angioedema is not associated with shortness of breath.   The onset of the reaction was associated with eating. Significant symptoms that are not present include eye redness, rhinorrhea or itching.    Past Medical History  Diagnosis Date  . Diabetes mellitus   . Hyperlipidemia   . Asthma     dx in adulthood (71 y/o) PFT 09/08/09 FEV1 1.70/81%; FEV1/FVC 0.75; +resp to dilartor; NI LV/DLCO  . Breast CA     hx of bilateral diagnosied in 2006, recurred 2011- Dr Claria Dice  . Colonic polyp   . Osteopenia   . Mass of ovary     resolved  . Allergic rhinitis     . OSA (obstructive sleep apnea)     CPAP intolerant  . Peripheral neuropathy   . COPD (chronic obstructive pulmonary disease)   . Sore throat   . Visual disturbance   . Cough   . Wheezing   . Abdominal distention   . Abdominal pain   . Constipation     Past Surgical History  Procedure Date  . Appendectomy   . Mastectomy 2006    bilateral  . Pilonidal cyst surgery     ? of    Family History  Problem Relation Age of Onset  . Breast cancer      GM,sister  . Colon cancer Neg Hx   . Diabetes Brother   . Heart attack      no h/o early diseae  . COPD Mother     smoker  . Hypertension Mother   . Cancer Father     lung    History  Substance Use Topics  . Smoking status: Former Games developer  . Smokeless tobacco: Never Used  . Alcohol Use: No    OB History    Grav Para Term Preterm Abortions TAB SAB Ect Mult Living                  Review of Systems  Constitutional: Positive for activity change and appetite change. Negative for fever, chills, diaphoresis, fatigue and unexpected weight change.  HENT: Positive for sore throat, trouble swallowing, voice change and sinus pressure. Negative for hearing loss, ear pain, congestion, facial swelling, rhinorrhea, neck pain, neck  stiffness, dental problem and tinnitus.   Eyes: Negative for redness.  Respiratory: Negative for shortness of breath and wheezing.   Cardiovascular: Negative for palpitations.  Gastrointestinal: Negative for nausea and vomiting.  Skin: Negative for itching and rash.    Allergies  Chocolate; Food; Peanut-containing drug products; and Penicillins  Home Medications   Current Outpatient Rx  Name Route Sig Dispense Refill  . ALBUTEROL SULFATE 0.63 MG/3ML IN NEBU Nebulization Take 3 mLs (0.63 mg total) by nebulization 4 (four) times daily as needed. 75 mL 5  . ALBUTEROL SULFATE HFA 108 (90 BASE) MCG/ACT IN AERS Inhalation Inhale 2 puffs into the lungs 4 (four) times daily. 6.7 g 6  . ASPIRIN 81 MG PO  TABS Oral Take 81 mg by mouth daily.     . BUDESONIDE-FORMOTEROL FUMARATE 160-4.5 MCG/ACT IN AERO Inhalation Inhale 2 puffs into the lungs 2 (two) times daily as needed. Rinse mouth well    . LETROZOLE 2.5 MG PO TABS Oral Take 1 tablet (2.5 mg total) by mouth daily. 30 tablet 4  . MAGNESIUM HYDROXIDE 800 MG/5ML PO SUSP Oral Take 5 mLs by mouth every evening.      Marland Kitchen METFORMIN HCL 1000 MG PO TABS Oral Take 1,000 mg by mouth daily with breakfast.    . MONTELUKAST SODIUM 10 MG PO TABS Oral Take 10 mg by mouth at bedtime.    . CENTRUM PO CHEW Oral Chew 1 tablet by mouth daily.      Marland Kitchen SIMVASTATIN 40 MG PO TABS Oral Take 40 mg by mouth at bedtime.    . TRIAMTERENE-HCTZ 37.5-25 MG PO TABS Oral Take 1 tablet by mouth daily.      BP 133/77  Pulse 84  Temp 98.6 F (37 C) (Oral)  Resp 20  SpO2 98%  Physical Exam  Nursing note and vitals reviewed. Constitutional: Vital signs are normal. She is active.  Non-toxic appearance. She does not have a sickly appearance. She does not appear ill. She appears distressed.  HENT:  Head: Normocephalic.  Mouth/Throat: Uvula is midline. Posterior oropharyngeal edema and posterior oropharyngeal erythema present. No oropharyngeal exudate or tonsillar abscesses.    Eyes: Conjunctivae are normal.  Neck: Neck supple. No JVD present.  Pulmonary/Chest: Effort normal and breath sounds normal. No respiratory distress. She has no wheezes. She has no rales. She exhibits no tenderness.  Lymphadenopathy:    She has no cervical adenopathy.  Neurological: She is alert.  Skin: Skin is warm. No rash noted. She is not diaphoretic. No erythema.    ED Course  Procedures (including critical care time)  Labs Reviewed - No data to display No results found.   1. Pharyngeal edema       MDM  Sudden onset of unilateral left pharyngeal edema. Possibly allergenic character patient had significant partial obstruction of her oropharynx reaching to the posterior wall of the  pharynx. Able to swallow her own saliva but with an obvious space conflict- an obvious odynophagia. She was undergoing evaluation with her pulmonologist and anemia knowledge his or her allergist. As patient seemed to be expressed recurrent episodes of allergic reactions versus angioedema. Patient was transferred to the emergency department for further observation as patient pharyngeal edema of new onset.       Jimmie Molly, MD 06/09/12 [phone]

## 2012-06-09 NOTE — Discharge Instructions (Signed)
Angioedema Angioedema (AE) is sudden puffiness (swelling) of the skin. It can happen to the face, genitals, and other body parts. You may be reacting to something you are sensitive to (allergic reaction). It may have been passed to you from your parents (hereditary), or it may develop by itself (acquired). You may also get red itchy patches of skin (hives). Attacks may be mild. Some attacks are life-threatening. Most often, the puffiness happens fast. It often gets better in 24 to 48 hours.  HOME CARE  Carry your emergency allergy medicines with you.   Wear a medical bracelet.   Avoid things that you know will cause this reaction (triggers).  GET HELP RIGHT AWAY IF:   You have trouble breathing.   You have trouble swallowing.   You pass out (faint).   You have another attack.   Your attacks happen more often or get worse.   AE was passed to you by your parents and you want to start having children.  You have fever, chest pain, faint, neck swelling, or other concerns.  MAKE SURE YOU:   Understand these instructions.   Will watch your condition.   Will get help right away if you are not doing well or get worse.  Document Released: 11/14/2009 Document Revised: 11/15/2011 Document Reviewed: 11/14/2009 [facility] Patient Information 2012 [place], [place].

## 2012-06-09 NOTE — ED Provider Notes (Signed)
History     CSN: [id number]  Arrival date & time 06/09/12  1329   First MD Initiated Contact with Patient 06/09/12 1850      Chief Complaint  Patient presents with  . Oral Swelling    (Consider location/radiation/quality/duration/timing/severity/associated sxs/prior treatment) HPI This 71 year old female is now asymptomatic after having an episode of angioedema earlier today. She has had over the last few months transient episodes of itching to her hands, she then will have episodes of swelling to her face, she had one episode of swelling to one side of her tongue, and now today had an episode of sudden onset swelling to the left side of her oropharynx. She felt fine today had eaten some food and had sudden swelling to the left side of her throat with some difficulty swallowing. She had no stridor and no drooling although there was a bit of the voice change. She had no shortness of breath chest pain lightheadedness or fainting. She had no rash or itching today. She was seen at the urgent care and given Solu-Medrol and Benadryl injections. She was sent to the emergency department for further observation to make sure she did not develop any airway compromise. The patient now feels back to normal with a normal voice again and her swelling is resolved. This episode started several hours ago it is now resolved. There is no family history of hereditary angioedema and she does not take ACEIs. Past Medical History  Diagnosis Date  . Diabetes mellitus   . Hyperlipidemia   . Asthma     dx in adulthood (71 y/o) PFT 09/08/09 FEV1 1.70/81%; FEV1/FVC 0.75; +resp to dilartor; NI LV/DLCO  . Breast CA     hx of bilateral diagnosied in 2006, recurred 2011- Dr Claria Dice  . Colonic polyp   . Osteopenia   . Mass of ovary     resolved  . Allergic rhinitis   . OSA (obstructive sleep apnea)     CPAP intolerant  . Peripheral neuropathy   . COPD (chronic obstructive pulmonary disease)   . Sore  throat   . Visual disturbance   . Cough   . Wheezing   . Abdominal distention   . Abdominal pain   . Constipation     Past Surgical History  Procedure Date  . Appendectomy   . Mastectomy 2006    bilateral  . Pilonidal cyst surgery     ? of    Family History  Problem Relation Age of Onset  . Breast cancer      GM,sister  . Colon cancer Neg Hx   . Diabetes Brother   . Heart attack      no h/o early diseae  . COPD Mother     smoker  . Hypertension Mother   . Cancer Father     lung    History  Substance Use Topics  . Smoking status: Former Games developer  . Smokeless tobacco: Never Used  . Alcohol Use: No    OB History    Grav Para Term Preterm Abortions TAB SAB Ect Mult Living                  Review of Systems  Constitutional: Negative for fever.       10 Systems reviewed and are negative for acute change except as noted in the HPI.  HENT: Positive for trouble swallowing and voice change. Negative for congestion, facial swelling, drooling, mouth sores, neck pain, neck stiffness and dental problem.  Eyes: Negative for discharge and redness.  Respiratory: Negative for cough and shortness of breath.   Cardiovascular: Negative for chest pain.  Gastrointestinal: Negative for vomiting and abdominal pain.  Musculoskeletal: Negative for back pain.  Skin: Negative for rash.  Neurological: Negative for syncope, numbness and headaches.  Psychiatric/Behavioral:       No behavior change.    Allergies  Chocolate; Food; Peanut-containing drug products; and Penicillins  Home Medications   Current Outpatient Rx  Name Route Sig Dispense Refill  . ALBUTEROL SULFATE 0.63 MG/3ML IN NEBU Nebulization Take 1 ampule by nebulization 4 (four) times daily as needed.    . ALBUTEROL SULFATE HFA 108 (90 BASE) MCG/ACT IN AERS Inhalation Inhale 2 puffs into the lungs 4 (four) times daily.    . ASPIRIN 325 MG PO TBEC Oral Take 325 mg by mouth daily.    . ASPIRIN 81 MG PO TABS Oral Take  81 mg by mouth daily.     . BUDESONIDE-FORMOTEROL FUMARATE 160-4.5 MCG/ACT IN AERO Inhalation Inhale 2 puffs into the lungs 2 (two) times daily as needed. Rinse mouth well    . DIPHENHYDRAMINE HCL 25 MG PO TABS Oral Take 50 mg by mouth daily as needed. For swelling    . LETROZOLE 2.5 MG PO TABS Oral Take 2.5 mg by mouth daily.    Marland Kitchen MAGNESIUM HYDROXIDE 800 MG/5ML PO SUSP Oral Take 5 mLs by mouth every evening.      Marland Kitchen METFORMIN HCL 1000 MG PO TABS Oral Take 1,000 mg by mouth daily with breakfast.    . MONTELUKAST SODIUM 10 MG PO TABS Oral Take 10 mg by mouth at bedtime.    . CENTRUM PO CHEW Oral Chew 1 tablet by mouth daily.      Marland Kitchen SIMVASTATIN 40 MG PO TABS Oral Take 40 mg by mouth at bedtime.    . TRIAMTERENE-HCTZ 37.5-25 MG PO TABS Oral Take 1 tablet by mouth daily.    Marland Kitchen DIPHENHYDRAMINE HCL 25 MG PO TABS Oral Take 2 tablets (50 mg total) by mouth every 4 (four) hours as needed for itching. 20 tablet 0  . EPINEPHRINE 0.3 MG/0.3ML IJ DEVI Intramuscular Inject 0.3 mLs (0.3 mg total) into the muscle once. 1 Device 0  . FAMOTIDINE 20 MG PO TABS Oral Take 1 tablet (20 mg total) by mouth 2 (two) times daily. 10 tablet 0  . LORATADINE 10 MG PO TABS Oral Take 1 tablet (10 mg total) by mouth daily. 5 tablet 0  . PREDNISONE 20 MG PO TABS  2 tabs po daily x 3 days 6 tablet 0    BP 143/84  Pulse 80  Temp 98.4 F (36.9 C) (Oral)  Resp 18  SpO2 99%  Physical Exam  Nursing note and vitals reviewed. Constitutional:       Awake, alert, nontoxic appearance.  HENT:  Head: Atraumatic.  Mouth/Throat: Oropharynx is clear and moist.       Her oropharynx now appears normal although the report from the urgent care prior to arrival documented edema to the left posterior oropharynx soft tissue which is now resolved after several hours in the emergency department.  Eyes: Conjunctivae are normal. Pupils are equal, round, and reactive to light. Right eye exhibits no discharge. Left eye exhibits no discharge.    Neck: Neck supple.  Cardiovascular: Normal rate and regular rhythm.   No murmur heard. Pulmonary/Chest: Effort normal and breath sounds normal. No respiratory distress. She has no wheezes. She has no rales. She exhibits  no tenderness.  Abdominal: Soft. There is no tenderness. There is no rebound.  Musculoskeletal: She exhibits no tenderness.       Baseline ROM, no obvious new focal weakness.  Neurological:       Mental status and motor strength appears baseline for patient and situation.  Skin: No rash noted.  Psychiatric: She has a normal mood and affect.    ED Course  Procedures (including critical care time) Resolved Sxs in ED and tongue normal at recheck. Labs Reviewed - No data to display No results found.   1. Angioedema       MDM  Pt stable in ED with no significant deterioration in condition.Patient / Family / Caregiver informed of clinical course, understand medical decision-making process, and agree with plan.        Hurman Horn, MD 06/15/12 1341

## 2012-06-11 ENCOUNTER — Telehealth: Payer: Self-pay | Admitting: Internal Medicine

## 2012-06-11 MED ORDER — EPINEPHRINE 0.3 MG/0.3ML IJ DEVI: 0.3000 mg | Freq: Once | INTRAMUSCULAR | Status: DC

## 2012-06-11 NOTE — Telephone Encounter (Signed)
Caller: [name]; PCP: [name]; CB#: [phone];  Call regarding Allergic Reactions; onset of sx "I really don't remember;" pt was seen in the office for allergic reactions of unknown origin on 04/04/12; MD instructed if throat starts to swell go to ER; pt went to ER on 06/09/12; Angiodema was dx in ER at [facility] ER on 06/09/12; pt is "feeling great" today; pt has an appt with allergist on 06/19/12;  offered appt but pt refused at this time; pt wanted to relay message to MD and see if MD would like for her to come in to be seen;  please call pt back if appt is needed;

## 2012-06-11 NOTE — Telephone Encounter (Signed)
ER records reviewed, had a severe allergic reaction. Recommend to continue with claritin, prednisone, Pepcid. Followup with her allergist as already set up. Also please call a EpiPen #1 , no RF, needs to read the instructions well; it is to be used if she has a serious allergic reaction that is compromising her airway (tonge,Throat, breathing). Needs to go immediately to the ER  if she ever uses it.

## 2012-06-11 NOTE — Telephone Encounter (Signed)
Discuss with patient  

## 2012-06-16 ENCOUNTER — Telehealth: Payer: Self-pay | Admitting: Internal Medicine

## 2012-06-16 NOTE — Telephone Encounter (Signed)
Advise patient, bone density test on 06/06/2012 was normal. Good results

## 2012-06-17 NOTE — Telephone Encounter (Signed)
Left detailed msg on pt's vmail.  

## 2012-06-18 ENCOUNTER — Encounter: Payer: Self-pay | Admitting: Internal Medicine

## 2012-06-18 NOTE — Telephone Encounter (Signed)
error 

## 2012-06-19 ENCOUNTER — Ambulatory Visit (INDEPENDENT_AMBULATORY_CARE_PROVIDER_SITE_OTHER): Payer: Medicare Other | Admitting: Internal Medicine

## 2012-06-19 ENCOUNTER — Encounter: Payer: Self-pay | Admitting: Internal Medicine

## 2012-06-19 VITALS — BP 122/80 | HR 69 | Ht 64.0 in | Wt 182.4 lb

## 2012-06-19 DIAGNOSIS — L509 Urticaria, unspecified: Secondary | ICD-10-CM

## 2012-06-19 DIAGNOSIS — G4733 Obstructive sleep apnea (adult) (pediatric): Secondary | ICD-10-CM

## 2012-06-19 DIAGNOSIS — J309 Allergic rhinitis, unspecified: Secondary | ICD-10-CM

## 2012-06-19 NOTE — Progress Notes (Signed)
Patient ID: [name], female    DOB: Apr 29, 1941, 71 y.o.   MRN: [id number]  HPI 07/16/11- 42 yoF former smoker followed for OSA/ failed CPAP, asthma, allergic rhinitis complicated by DM, hx breast cancer Blames heat for need to resume using her rescue inhaler..  She associates purple areas on lips from using her nebulizer machine- ? Hard enough to bruise? She couldn't tolerate and stopped using CPAP. We reviewed symptoms and warning signs again.  She has had spots on hands, pruritic, " inconclusive " on biopsy by dermatologist, Dr Margo Aye. Admits night sweats, cough, scant yellow phlegm.  Last CXR 12/27/10 f/u for recurrent breast cancer- left.    10/16/11- 4 yoF former smoker followed for OSA/ failed CPAP, asthma, allergic rhinitis complicated by DM, hx breast cancer She reports an urticarial reaction to peanuts 2 months ago and is unsure if she has had a reaction in the past 2 chocolate. She would like to look at food allergy testing so she knows better to talk to watch out for. We discussed food allergy versus intolerance.  She is taking prednisone and valacyclovir for a hyperpigmented rash on her hands, after bx by dermatologist. Possibly viral warts. Allergy symptoms do better in cold weather but will also improve on her steroids. Cough has been drier with scant clear sputum. She has not needed her nebulizer machine at all.  Chest x-ray: 07/16/2011-NAD, mild CE, bronchitis changes, arthritis in spine.   04/15/12- 70 yoF former smoker followed for OSA/ failed CPAP, asthma, allergic rhinitis, Food Allergy complicated by DM, hx breast cancer Had 2 chocolate chip cookies yesterday-started itching and swelling; did not go to ER; still itching today. On May 6 she 82 chocolate chip cookies, not recognized contained nuts. 4-5 hours later she developed facial swelling/angioedema with some itching but no wheeze. She took Benadryl. She continues to have some swelling in the left side of her  face now 24 hours later. She never had any increased wheeze throat swelling or other airway discomfort. She has noticed increased wheeze and dry cough over the last 2 months, responsive to her rescue inhaler. Additional problem of rash on her arms which begins like pimples but develops a hyperpigmented area about 3 cm diameter which response to steroid cream. Dermatologist did biopsy. Results nonspecific. No relation noted between this rash and her other health problems, exposures or other triggers.  06/19/12- 70 yoF former smoker followed for OSA/ failed CPAP, asthma, allergic rhinitis, Food Allergy complicated by DM, hx breast cancer Had to recently go to ER for throat swelling-unsure of cause. Not sleeping well at times and doesnt use CPAP recently-has had more stressors going on to cause this. We reviewed her ER note. Over several months she's had episodes of itching hands then swelling of one side or the other of her tongue that July 7 she had angioedema in her throat. There is no family history of angioedema and she has not been on ACE inhibitors. She avoids known foods that cause problems in the past. 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 None clearly in significant range. She did not take Benadryl as recommended that the emergency room because of listed side effect of sleepiness. We discussed alternatives. She has an EpiPen if needed.  Review of Systems-See HPI Constitutional:   No-   weight loss, night sweats, fevers, chills, fatigue, lassitude. HEENT:   No-  headaches, difficulty swallowing, tooth/dental problems, sore throat,  No-  sneezing, +itching, ear ache, nasal congestion, post nasal drip,  CV:  No-   chest pain, orthopnea, PND, swelling in lower extremities, anasarca, dizziness, palpitations Resp: No-   shortness of breath with exertion or at rest.              No-   productive cough,  + non-productive cough,  No- coughing up  of blood.              No-   change in color of mucus.  No- wheezing.   Skin: No-   rash or lesions. GI:  No-   heartburn, indigestion, abdominal pain, nausea, vomiting, GU:  MS:  No-   joint pain or swelling. . Neuro-     nothing unusual Psych:  No- change in mood or affect. No depression or anxiety.  No memory loss.   Objective:   Physical Exam General- Alert, Oriented, Affect-appropriate, Distress- none acute    Overweight.  Skin- clear Lymphadenopathy- none Head- atraumatic            Eyes- Gross vision intact, PERRLA, conjunctivae clear secretions            Ears- Hearing, canals normal            Nose- Clear, no-Septal dev, mucus, polyps, erosion, perforation             Throat- Mallampati III , mucosa clear , drainage- none, tonsils- atrophic Neck- flexible , trachea midline, no stridor , thyroid nl, carotid no bruit Chest - symmetrical excursion , unlabored           Heart/CV- RRR , no murmur , no gallop  , no rub, nl s1 s2                           - JVD- none , edema- none, stasis changes- none, varices- none           Lung- no- wheeze , no- cough,  dullness-none, rub- none           Chest wall-  Abd-  Br/ Gen/ Rectal- Not done, not indicated Extrem- cyanosis- none, clubbing, none, atrophy- none, strength- nl Neuro- grossly intact to observation

## 2012-06-19 NOTE — Patient Instructions (Addendum)
Ok to continue daily Claritin/ loratadine antihistamine  If this isn't quite enough, then try adding your diphenhydramine/ Benadryl once every night at bedtime.  Diphenhydramine can also be a good "rescue " medicine to take right away if you feel an attack starting. It will make you sleepy- maybe too sleepy to drive safely.

## 2012-06-28 NOTE — Assessment & Plan Note (Signed)
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 It may contribute occasionally to overall stress which can be a trigger.

## 2012-06-28 NOTE — Assessment & Plan Note (Signed)
History of recognized food triggers previously discussed. Atopy does not seem to be a major component. She describes  social stress which might be contributing We will emphasize full maintenance with a common antihistamine and I discussed addition of Singulair, a second antihistamine, and low dose prednisone if necessary.

## 2012-06-28 NOTE — Assessment & Plan Note (Signed)
We talked again about treatment options. I can't argue that sleep apnea is related to her urticaria/angioedema but it may be a contributing stress.

## 2012-07-16 ENCOUNTER — Ambulatory Visit: Payer: Self-pay | Admitting: Obstetrics and Gynecology

## 2012-08-04 ENCOUNTER — Other Ambulatory Visit: Payer: Self-pay | Admitting: Internal Medicine

## 2012-08-04 NOTE — Telephone Encounter (Signed)
Needs to call her oncologist for refill on this medication

## 2012-08-04 NOTE — Telephone Encounter (Signed)
OK to refill

## 2012-08-04 NOTE — Telephone Encounter (Signed)
Refill Letrozole (Tab)  I can not read the refill to tell you how many or how to take Last ov 4.26.13 acute

## 2012-08-05 ENCOUNTER — Telehealth: Payer: Self-pay | Admitting: *Deleted

## 2012-08-05 NOTE — Telephone Encounter (Signed)
LMVOM for pt to return call.

## 2012-08-05 NOTE — Telephone Encounter (Signed)
Pt left vm on triage line returning your call, best contact number [phone]

## 2012-08-05 NOTE — Telephone Encounter (Signed)
Pt left msg on triage vmail returning phone call. Left msg for pt to call the office.

## 2012-08-05 NOTE — Telephone Encounter (Signed)
LMOVM for pt to return call 

## 2012-08-07 NOTE — Telephone Encounter (Signed)
Discuss with patient  

## 2012-08-12 ENCOUNTER — Telehealth: Payer: Self-pay | Admitting: *Deleted

## 2012-08-12 ENCOUNTER — Encounter: Payer: Self-pay | Admitting: *Deleted

## 2012-08-12 MED ORDER — LETROZOLE 2.5 MG PO TABS: 2.5000 mg | ORAL_TABLET | Freq: Every day | ORAL | Status: DC

## 2012-08-12 NOTE — Telephone Encounter (Signed)
Pt called requesting refill on letrozole. Pt denies pain, discomfort or problems. Pt' last seen in June. Per MD note pt to continue Letrozole and f/u in dec. Confirmed with pt using [facility] on [address]. Pt confirmed she appt for December. Pt instructed to call for any future concerns.

## 2012-08-22 ENCOUNTER — Ambulatory Visit (INDEPENDENT_AMBULATORY_CARE_PROVIDER_SITE_OTHER): Payer: Medicare Other | Admitting: Internal Medicine

## 2012-08-22 ENCOUNTER — Encounter: Payer: Self-pay | Admitting: Internal Medicine

## 2012-08-22 VITALS — BP 136/70 | HR 86 | Ht 64.0 in | Wt 191.2 lb

## 2012-08-22 DIAGNOSIS — Z23 Encounter for immunization: Secondary | ICD-10-CM

## 2012-08-22 DIAGNOSIS — C50919 Malignant neoplasm of unspecified site of unspecified female breast: Secondary | ICD-10-CM

## 2012-08-22 DIAGNOSIS — K219 Gastro-esophageal reflux disease without esophagitis: Secondary | ICD-10-CM

## 2012-08-22 DIAGNOSIS — F5104 Psychophysiologic insomnia: Secondary | ICD-10-CM

## 2012-08-22 DIAGNOSIS — E559 Vitamin D deficiency, unspecified: Secondary | ICD-10-CM

## 2012-08-22 DIAGNOSIS — J45998 Other asthma: Secondary | ICD-10-CM

## 2012-08-22 MED ORDER — BUDESONIDE-FORMOTEROL FUMARATE 160-4.5 MCG/ACT IN AERO: 2.0000 | INHALATION_SPRAY | Freq: Two times a day (BID) | RESPIRATORY_TRACT | Status: DC

## 2012-08-22 MED ORDER — ALBUTEROL SULFATE HFA 108 (90 BASE) MCG/ACT IN AERS: 2.0000 | INHALATION_SPRAY | RESPIRATORY_TRACT | Status: DC | PRN

## 2012-08-22 MED ORDER — MONTELUKAST SODIUM 10 MG PO TABS: 10.0000 mg | ORAL_TABLET | Freq: Every day | ORAL | Status: AC

## 2012-08-22 MED ORDER — ZALEPLON 5 MG PO CAPS: ORAL_CAPSULE | ORAL | Status: DC

## 2012-08-22 NOTE — Patient Instructions (Addendum)
Pay attention to how much you are refluxing at night. Avoid eating or drinking right before you lie down.  Refill scripts for routine meds. Symbicort is a maintenance inhaler. Use 2 puffs, then rinse mouth well, twice daily every day, whether you feel you need it then or not.  New script for sleep med sonata/ zalpelon to try if you wake in middle of night and have trouble getting back to sleep.  Flu vax

## 2012-08-22 NOTE — Progress Notes (Signed)
Patient ID: [name], female    DOB: December 08, 1941, 71 y.o.   MRN: [id number]  HPI 07/16/11- 54 yoF former smoker followed for OSA/ failed CPAP, asthma, allergic rhinitis complicated by DM, hx breast cancer Blames heat for need to resume using her rescue inhaler..  She associates purple areas on lips from using her nebulizer machine- ? Hard enough to bruise? She couldn't tolerate and stopped using CPAP. We reviewed symptoms and warning signs again.  She has had spots on hands, pruritic, " inconclusive " on biopsy by dermatologist, Dr Nevada Crane. Admits night sweats, cough, scant yellow phlegm.  Last CXR 12/27/10 f/u for recurrent breast cancer- left.    10/16/11- 25 yoF former smoker followed for OSA/ failed CPAP, asthma, allergic rhinitis complicated by DM, hx breast cancer She reports an urticarial reaction to peanuts 2 months ago and is unsure if she has had a reaction in the past 2 chocolate. She would like to look at food allergy testing so she knows better to talk to watch out for. We discussed food allergy versus intolerance.  She is taking prednisone and valacyclovir for a hyperpigmented rash on her hands, after bx by dermatologist. Possibly viral warts. Allergy symptoms do better in cold weather but will also improve on her steroids. Cough has been drier with scant clear sputum. She has not needed her nebulizer machine at all.  Chest x-ray: 07/16/2011-NAD, mild CE, bronchitis changes, arthritis in spine.   04/15/12- 70 yoF former smoker followed for OSA/ failed CPAP, asthma, allergic rhinitis, Food Allergy complicated by DM, hx breast cancer Had 2 chocolate chip cookies yesterday-started itching and swelling; did not go to ER; still itching today. On May 6 she 82 chocolate chip cookies, not recognized contained nuts. 4-5 hours later she developed facial swelling/angioedema with some itching but no wheeze. She took Benadryl. She continues to have some swelling in the left side of her  face now 24 hours later. She never had any increased wheeze throat swelling or other airway discomfort. She has noticed increased wheeze and dry cough over the last 2 months, responsive to her rescue inhaler. Additional problem of rash on her arms which begins like pimples but develops a hyperpigmented area about 3 cm diameter which response to steroid cream. Dermatologist did biopsy. Results nonspecific. No relation noted between this rash and her other health problems, exposures or other triggers.  06/19/12- 70 yoF former smoker followed for OSA/ failed CPAP, asthma, allergic rhinitis, Food Allergy complicated by DM, hx breast cancer Had to recently go to ER for throat swelling-unsure of cause. Not sleeping well at times and doesnt use CPAP recently-has had more stressors going on to cause this. We reviewed her ER note. Over several months she's had episodes of itching hands then swelling of one side or the other of her tongue that July 7 she had angioedema in her throat. There is no family history of angioedema and she has not been on ACE inhibitors. She avoids known foods that cause problems in the past. 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 None clearly in significant range. She did not take Benadryl as recommended that the emergency room because of listed side effect of sleepiness. We discussed alternatives. She has an EpiPen if needed.  08/22/12-  70 yoF former smoker followed for OSA/ failed CPAP, asthma, allergic rhinitis, Food Allergy complicated by DM, hx breast cancer Cough-clear in color; using rescue inhaler more than usual; Increased cough lying in  bed and aware of reflux. Insomnia wakes her around 2 AM but she does not think that is from coughing. She has been irregular using Symbicort, trying to use it as a rescue inhaler. We discussed that. Some increase in exertional dyspnea.  Review of Systems-See HPI Constitutional:   No-   weight  loss, night sweats, fevers, chills, fatigue, lassitude. HEENT:   No-  headaches, difficulty swallowing, tooth/dental problems, sore throat,       No-  sneezing, +itching, ear ache, nasal congestion, post nasal drip,  CV:  No-   chest pain, orthopnea, PND, swelling in lower extremities, anasarca, dizziness, palpitations Resp: + shortness of breath with exertion or at rest.              No-   productive cough,  + non-productive cough,  No- coughing up of blood.              No-   change in color of mucus.  No- wheezing.   Skin: No-   rash or lesions. GI:  +   heartburn, indigestion, no-abdominal pain, nausea, vomiting, GU:  MS:  No-   joint pain or swelling. . Neuro-     nothing unusual Psych:  No- change in mood or affect. No depression or anxiety.  No memory loss.  Objective:   Physical Exam General- Alert, Oriented, Affect-appropriate, Distress- none acute    Overweight.  Skin- clear Lymphadenopathy- none Head- atraumatic            Eyes- Gross vision intact, PERRLA, conjunctivae clear secretions            Ears- Hearing, canals normal            Nose- Clear, no-Septal dev, mucus, polyps, erosion, perforation             Throat- Mallampati III , mucosa clear , drainage- none, tonsils- atrophic Neck- flexible , trachea midline, no stridor , thyroid nl, carotid no bruit Chest - symmetrical excursion , unlabored           Heart/CV- RRR , no murmur , no gallop  , no rub, nl s1 s2                           - JVD- none , edema- none, stasis changes- none, varices- none           Lung- + mild wheeze and wheezy cough, unlabored,  dullness-none, rub- none           Chest wall-  Abd- +active borborygmi Br/ Gen/ Rectal- Not done, not indicated Extrem- cyanosis- none, clubbing, none, atrophy- none, strength- nl Neuro- grossly intact to observation

## 2012-08-31 DIAGNOSIS — G4700 Insomnia, unspecified: Secondary | ICD-10-CM | POA: Insufficient documentation

## 2012-08-31 DIAGNOSIS — K219 Gastro-esophageal reflux disease without esophagitis: Secondary | ICD-10-CM | POA: Insufficient documentation

## 2012-08-31 NOTE — Assessment & Plan Note (Signed)
Educated on insomnia versus sleep apnea. Sleep hygiene. Plan-Sonata

## 2012-08-31 NOTE — Assessment & Plan Note (Signed)
Educated on reflux precautions and the interaction between reflux and cough.

## 2012-08-31 NOTE — Assessment & Plan Note (Signed)
Exacerbation, probably with a seasonal component to also question role of reflux. Plan-educated on use of maintenance and rescue inhalers. Flu vaccine.

## 2012-10-29 ENCOUNTER — Telehealth: Payer: Self-pay | Admitting: Obstetrics and Gynecology

## 2012-10-29 NOTE — Telephone Encounter (Signed)
VPH pt 

## 2012-10-30 NOTE — Telephone Encounter (Signed)
Lm on vm to cb per telephone call.  

## 2012-10-31 ENCOUNTER — Encounter (INDEPENDENT_AMBULATORY_CARE_PROVIDER_SITE_OTHER): Payer: Self-pay | Admitting: General Surgery

## 2012-10-31 ENCOUNTER — Ambulatory Visit (INDEPENDENT_AMBULATORY_CARE_PROVIDER_SITE_OTHER): Payer: Medicare Other | Admitting: General Surgery

## 2012-10-31 VITALS — BP 124/76 | HR 72 | Temp 97.4°F | Resp 18 | Ht 64.0 in | Wt 192.0 lb

## 2012-10-31 DIAGNOSIS — Z853 Personal history of malignant neoplasm of breast: Secondary | ICD-10-CM

## 2012-10-31 NOTE — Progress Notes (Signed)
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 She has a history of an axillary skin recurrence in 2011 status post excision and radiation and now on adjuvant letrazole. She reports no problems in terms of her chest wall or skin or arm swelling. She had been having some epigastric discomfort when I saw her last in May and this seems to be better with over-the-counter antacid therapy.  Exam: BP 124/76  Pulse 72  Temp 97.4 F (36.3 C) (Temporal)  Resp 18  Ht 5\' 4"  (1.626 m)  Wt 192 lb (87.091 kg)  BMI 32.96 kg/m2 General: Overweight but otherwise healthy-appearing Afro-American female Skin: Warm and dry without rash infection Lymph nodes: No cervical, supraclavicular or axillary nodes palpable Lungs: Clear equal breath sounds bilaterally Chest wall: Status post bilateral mastectomy. Mild postradiation changes on the left. There is no skin thickening or mass or other evidence of recurrence.  Assessment and plan: History bilateral mastectomy for DCIS and invasive cancer with history of skin recurrence on the left and radiation. On hormonal treatment. No clinical evidence of recurrence. Return in 6 months.

## 2012-11-03 ENCOUNTER — Other Ambulatory Visit: Payer: Self-pay | Admitting: Internal Medicine

## 2012-11-03 NOTE — Telephone Encounter (Signed)
Refill done.  

## 2012-11-03 NOTE — Telephone Encounter (Signed)
Tc to pt per telephone call. Pt with a brown-tinged vaginal discharge. No vag odor or irritation. No abd pain or fever. AEX pres sched 11/12/12. Pt will keep appt and eval sx's @ that time. Pt informed to cb if sx's inc or any new s/s occur. Pt voices understanding.

## 2012-11-12 ENCOUNTER — Encounter: Payer: Self-pay | Admitting: Obstetrics and Gynecology

## 2012-11-12 ENCOUNTER — Ambulatory Visit: Payer: Medicare Other

## 2012-11-12 ENCOUNTER — Other Ambulatory Visit: Payer: Self-pay | Admitting: Obstetrics and Gynecology

## 2012-11-12 ENCOUNTER — Ambulatory Visit (INDEPENDENT_AMBULATORY_CARE_PROVIDER_SITE_OTHER): Payer: Medicare Other

## 2012-11-12 ENCOUNTER — Ambulatory Visit (INDEPENDENT_AMBULATORY_CARE_PROVIDER_SITE_OTHER): Payer: Medicare Other | Admitting: Obstetrics and Gynecology

## 2012-11-12 VITALS — BP 118/80 | HR 74 | Resp 16 | Ht 64.0 in | Wt 194.0 lb

## 2012-11-12 DIAGNOSIS — Z124 Encounter for screening for malignant neoplasm of cervix: Secondary | ICD-10-CM

## 2012-11-12 DIAGNOSIS — N898 Other specified noninflammatory disorders of vagina: Secondary | ICD-10-CM

## 2012-11-12 DIAGNOSIS — Z86010 Personal history of colonic polyps: Secondary | ICD-10-CM

## 2012-11-12 DIAGNOSIS — Z853 Personal history of malignant neoplasm of breast: Secondary | ICD-10-CM

## 2012-11-12 DIAGNOSIS — R1032 Left lower quadrant pain: Secondary | ICD-10-CM

## 2012-11-12 DIAGNOSIS — Z719 Counseling, unspecified: Secondary | ICD-10-CM

## 2012-11-12 NOTE — Progress Notes (Signed)
Annual :  Last Pap:  06/21/2010 WNL: Yes Regular Periods:no Contraception: None  Monthly Breast exam:no Tetanus<32yrs:no Nl.Bladder Function:yes "FREQUENCY" Daily BMs:no "Taking Milk of Magnesium"  Healthy Diet:no Calcium:yes Mammogram:yes Date of Mammogram: 09/11/2011 Exercise:Yes Have often Exercise: Irregular per pt Seatbelt: yes Abuse at home: no Stressful work:no Sigmoid-colonoscopy: 04/2011 "Virtual" Bone Density: Yes 2013 "WNL" PCP: Dr.Jose Paz Medical Onc:  Dr. Pierce Crane Change in PMH: No Changes Change in FMH:No changes     Subjective:    [name] is a 71 y.o. female No obstetric history on file. who presents for annual exam.  . Chronic intermittent LLQ pain associated with gas.  Had colonoscopy 2012 which was nl.  No rectal bleeding.  Intermittent vaginal discharge without sx which turns panties brownish.S/p bilateral mastectomy for breast cancer with intermittent sensation similar to premenstrual periods prior to menopause.  The following portions of the patient's history were reviewed and updated as appropriate: allergies, current medications, past family history, past medical history, past social history, past surgical history and problem list.  Review of Systems Pertinent items are noted in HPI. Gastrointestinal:No change in bowel habits, no abdominal pain, no rectal bleeding Genitourinary:negative for dysuria, frequency, hematuria, nocturia and urinary incontinence    Objective:     BP 118/80  Resp 16  Ht 5\' 4"  (1.626 m)  Wt 194 lb (87.998 kg)  BMI 33.30 kg/m2  Weight:  Wt Readings from Last 1 Encounters:  11/12/12 194 lb (87.998 kg)     BMI: Body mass index is 33.30 kg/(m^2). General Appearance: Alert, appropriate appearance for age. No acute distress HEENT: Grossly normal Neck / Thyroid: Supple, no masses, nodes or enlargement Lungs: clear to auscultation bilaterally Back: No CVA tenderness Breast Exam:s/p bilateral mastectomy.   No masses noted.  Mild tenderness in fat tissue bilaterally Cardiovascular: Regular rate and rhythm. S1, S2, no murmur Gastrointestinal: Soft, non-tender, no masses or organomegaly Exam is limited by body habitus Pelvic Exam: External genitalia: normal general appearance Vaginal: atrophic mucosa Cervix: atrophic without contact bleeding Adnexa: non palpable Uterus: doesn't feel enlarged Exam limited by body habitus Rectovaginal: normal rectal, no masses and tenderness  Lymphatic Exam: Non-palpable nodes in neck, clavicular, axillary, or inguinal regions  Skin: no rash or abnormalities Neurologic: Normal gait and speech, no tremor  Psychiatric: Alert and oriented, appropriate affect.    Urinalysis:Not done    Assessment:  S/p bilateral mastectomy for breast cancer with recurrence, now with intermittent sensation similar to premenstrual periods prior to menopause  Intermittent vaginal discharge, otherwise asymptomatic  Episodic mastalgia   Plan:   pap smear U/s to r/o adnexal masses (see result above) F/u for aex Keep f/u with oncologist and review mastalgia   Dierdre Forth MD

## 2012-11-12 NOTE — Patient Instructions (Signed)
[name] (over the counter) twice weekly

## 2012-11-13 LAB — PAP IG AND HPV HIGH-RISK: HPV DNA High Risk: NOT DETECTED

## 2012-11-14 ENCOUNTER — Other Ambulatory Visit: Payer: Self-pay | Admitting: Internal Medicine

## 2012-11-14 NOTE — Telephone Encounter (Signed)
Refill done.  

## 2012-11-21 ENCOUNTER — Ambulatory Visit: Payer: Medicare Other | Admitting: Oncology

## 2012-11-21 ENCOUNTER — Other Ambulatory Visit: Payer: Medicare Other | Admitting: Lab

## 2012-12-08 ENCOUNTER — Other Ambulatory Visit (HOSPITAL_BASED_OUTPATIENT_CLINIC_OR_DEPARTMENT_OTHER): Payer: Medicare Other | Admitting: Lab

## 2012-12-08 ENCOUNTER — Ambulatory Visit (HOSPITAL_BASED_OUTPATIENT_CLINIC_OR_DEPARTMENT_OTHER): Payer: Medicare Other | Admitting: Oncology

## 2012-12-08 VITALS — BP 171/90 | HR 80 | Temp 98.2°F | Resp 20 | Ht 64.0 in | Wt 193.0 lb

## 2012-12-08 DIAGNOSIS — D0590 Unspecified type of carcinoma in situ of unspecified breast: Secondary | ICD-10-CM

## 2012-12-08 DIAGNOSIS — C50219 Malignant neoplasm of upper-inner quadrant of unspecified female breast: Secondary | ICD-10-CM

## 2012-12-08 DIAGNOSIS — C50919 Malignant neoplasm of unspecified site of unspecified female breast: Secondary | ICD-10-CM

## 2012-12-08 DIAGNOSIS — Z9010 Acquired absence of unspecified breast and nipple: Secondary | ICD-10-CM

## 2012-12-08 DIAGNOSIS — Z170 Estrogen receptor positive status [ER+]: Secondary | ICD-10-CM

## 2012-12-08 DIAGNOSIS — E559 Vitamin D deficiency, unspecified: Secondary | ICD-10-CM

## 2012-12-08 LAB — COMPREHENSIVE METABOLIC PANEL (CC13)
Albumin: 4.2 g/dL (ref 3.5–5.0)
BUN: 13 mg/dL (ref 7.0–26.0)
CO2: 26 mEq/L (ref 22–29)
Glucose: 83 mg/dl (ref 70–99)
Sodium: 142 mEq/L (ref 136–145)
Total Bilirubin: 0.54 mg/dL (ref 0.20–1.20)
Total Protein: 7.4 g/dL (ref 6.4–8.3)

## 2012-12-08 LAB — CBC WITH DIFFERENTIAL/PLATELET
Basophils Absolute: 0.1 10*3/uL (ref 0.0–0.1)
EOS%: 3.5 % (ref 0.0–7.0)
HCT: 39 % (ref 34.8–46.6)
HGB: 13.1 g/dL (ref 11.6–15.9)
LYMPH%: 19.5 % (ref 14.0–49.7)
MCH: 30.2 pg (ref 25.1–34.0)
MCHC: 33.6 g/dL (ref 31.5–36.0)
MCV: 89.9 fL (ref 79.5–101.0)
NEUT%: 70.5 % (ref 38.4–76.8)
Platelets: 292 10*3/uL (ref 145–400)
lymph#: 1.7 10*3/uL (ref 0.9–3.3)

## 2012-12-08 NOTE — Progress Notes (Signed)
Hematology and Oncology Follow Up Visit  [name] [id number] [birth year] 71 y.o. 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 Lt breast cancer, invasive cancer,  largest focus T1 tubular type cancer.  Recurrence in lt axillary fold ,5/10/ 2011 for ER/PR + HER 2-VE , s/p reexcision , xrt and now on letrozole. Bone density has been stable  Interim History: She is doing well. She is thought to have more allergies to other foods. She is having mild hot flashes.   Medications: I have reviewed the patient's current medications.  Allergies:  Allergies  Allergen Reactions  . Food     nuts  . Peanut-Containing Drug Products   . Penicillins Other (See Comments)    REACTION: outer rectal itching    Past Medical History, Surgical history, Social history, and Family History were reviewed and updated.  Review of Systems: Constitutional:  Negative for fever, chills, night sweats, anorexia, weight loss, pain. Cardiovascular: no chest pain , dyspnea on exertion gets easily winded. Respiratory: + cough, shortness of breath, or wheezing Neurological: no TIA or stroke symptoms Dermatological: negative ENT: negative Skin : possible hives recently, ? Rxn to peanuts Gastrointestinal: no abdominal pain, change in bowel habits, or black or bloody stools, uses MOM and miralax for constipation management Genito-Urinary: no dysuria, trouble voiding, or hematuria Hematological and Lymphatic: negative Breast: negative for breast lumps Musculoskeletal: negative Remaining ROS negative.  Physical Exam: Blood pressure 171/90, pulse 80, temperature 98.2 F (36.8 C), resp. rate 20, height 5\' 4"  (1.626 m), weight 193 lb (87.544 kg). ECOG: 0 General appearance: alert, cooperative and appears stated age Head: Normocephalic, without obvious abnormality, atraumatic Neck: no adenopathy, no  carotid bruit, no JVD, supple, symmetrical, trachea midline and thyroid not enlarged, symmetric, no tenderness/mass/nodules Lymph nodes: Cervical, supraclavicular, and axillary nodes normal. Cardiac : regular rate and rhythm, no murmurs or gallops Pulmonary:clear to auscultation bilaterally and normal percussion bilaterally Breasts: positive findings: s/p bilateral mastectomy, with prominent axillary skin folds and noevidence for local recurrence, s/p radiation related changes on lt side. Abdomen:soft, non-tender; bowel sounds normal; no masses,  no organomegaly Extremities negative Neuro: alert, oriented, normal speech, no focal findings or movement disorder noted  Lab Results: Lab Results  Component Value Date   WBC 8.5 12/08/2012   HGB 13.1 12/08/2012   HCT 39.0 12/08/2012   MCV 89.9 12/08/2012   PLT 292 12/08/2012     Chemistry      Component Value Date/Time   NA 142 12/08/2012 1409   NA 140 05/16/2012 1336   K 3.9 12/08/2012 1409   K 3.9 05/16/2012 1336   CL 105 12/08/2012 1409   CL 102 05/16/2012 1336   CO2 26 12/08/2012 1409   CO2 26 05/16/2012 1336   BUN 13.0 12/08/2012 1409   BUN 17 05/16/2012 1336   CREATININE 0.8 12/08/2012 1409   CREATININE 0.91 05/16/2012 1336      Component Value Date/Time   CALCIUM 10.1 12/08/2012 1409   CALCIUM 10.0 05/16/2012 1336   ALKPHOS 72 12/08/2012 1409   ALKPHOS 62 05/16/2012 1336   AST 17 12/08/2012 1409   AST 17 05/16/2012 1336   ALT 10 12/08/2012 1409   ALT 14 05/16/2012 1336   BILITOT 0.54 12/08/2012 1409   BILITOT 0.5 05/16/2012 1336      .pathology. Radiological Studies: chest X-ray n/a Mammogram n/a Bone density 6/13- wnl  Impression and Plan: Pt is doing well, she is NED and will be seen in 6  Months and wll continue letrozole which she tolerates well. We will check tumor markers as well Discusses exercise and wt management.  More than 50% of the visit was spent in patient-related counselling   Pierce Crane, MD 12/30/20133:48  PM

## 2012-12-09 LAB — CALCIUM, IONIZED: Calcium, Ion: 1.31 mmol/L (ref 1.12–1.32)

## 2012-12-16 ENCOUNTER — Other Ambulatory Visit: Payer: Self-pay | Admitting: Internal Medicine

## 2012-12-17 NOTE — Telephone Encounter (Signed)
Refill done.  

## 2012-12-23 ENCOUNTER — Ambulatory Visit (INDEPENDENT_AMBULATORY_CARE_PROVIDER_SITE_OTHER): Payer: Medicare PPO | Admitting: Internal Medicine

## 2012-12-23 ENCOUNTER — Encounter: Payer: Self-pay | Admitting: Internal Medicine

## 2012-12-23 VITALS — BP 124/64 | HR 85 | Ht 64.0 in | Wt 195.2 lb

## 2012-12-23 DIAGNOSIS — J45909 Unspecified asthma, uncomplicated: Secondary | ICD-10-CM

## 2012-12-23 DIAGNOSIS — J45998 Other asthma: Secondary | ICD-10-CM

## 2012-12-23 DIAGNOSIS — Z853 Personal history of malignant neoplasm of breast: Secondary | ICD-10-CM

## 2012-12-23 DIAGNOSIS — G4700 Insomnia, unspecified: Secondary | ICD-10-CM

## 2012-12-23 DIAGNOSIS — F5104 Psychophysiologic insomnia: Secondary | ICD-10-CM

## 2012-12-23 MED ORDER — TEMAZEPAM 15 MG PO CAPS: 15.0000 mg | ORAL_CAPSULE | Freq: Every evening | ORAL | Status: DC | PRN

## 2012-12-23 NOTE — Progress Notes (Signed)
Patient ID: [name], female    DOB: December 08, 1941, 72 y.o.   MRN: [id number]  HPI 07/16/11- 72 yoF former smoker followed for OSA/ failed CPAP, asthma, allergic rhinitis complicated by DM, hx breast cancer Blames heat for need to resume using her rescue inhaler..  She associates purple areas on lips from using her nebulizer machine- ? Hard enough to bruise? She couldn't tolerate and stopped using CPAP. We reviewed symptoms and warning signs again.  She has had spots on hands, pruritic, " inconclusive " on biopsy by dermatologist, Dr [place] Crane. Admits night sweats, cough, scant yellow phlegm.  Last CXR 12/27/10 f/u for recurrent breast cancer- left.    10/16/11- 72 yoF former smoker followed for OSA/ failed CPAP, asthma, allergic rhinitis complicated by DM, hx breast cancer She reports an urticarial reaction to peanuts 2 months ago and is unsure if she has had a reaction in the past 2 chocolate. She would like to look at food allergy testing so she knows better to talk to watch out for. We discussed food allergy versus intolerance.  She is taking prednisone and valacyclovir for a hyperpigmented rash on her hands, after bx by dermatologist. Possibly viral warts. Allergy symptoms do better in cold weather but will also improve on her steroids. Cough has been drier with scant clear sputum. She has not needed her nebulizer machine at all.  Chest x-ray: 07/16/2011-NAD, mild CE, bronchitis changes, arthritis in spine.   04/15/12- 72 yoF former smoker followed for OSA/ failed CPAP, asthma, allergic rhinitis, Food Allergy complicated by DM, hx breast cancer Had 2 chocolate chip cookies yesterday-started itching and swelling; did not go to ER; still itching today. On May 6 she 82 chocolate chip cookies, not recognized contained nuts. 4-5 hours later she developed facial swelling/angioedema with some itching but no wheeze. She took Benadryl. She continues to have some swelling in the left side of her  face now 24 hours later. She never had any increased wheeze throat swelling or other airway discomfort. She has noticed increased wheeze and dry cough over the last 2 months, responsive to her rescue inhaler. Additional problem of rash on her arms which begins like pimples but develops a hyperpigmented area about 3 cm diameter which response to steroid cream. Dermatologist did biopsy. Results nonspecific. No relation noted between this rash and her other health problems, exposures or other triggers.  06/19/12- 72 yoF former smoker followed for OSA/ failed CPAP, asthma, allergic rhinitis, Food Allergy complicated by DM, hx breast cancer Had to recently go to ER for throat swelling-unsure of cause. Not sleeping well at times and doesnt use CPAP recently-has had more stressors going on to cause this. We reviewed her ER note. Over several months she's had episodes of itching hands then swelling of one side or the other of her tongue that July 7 she had angioedema in her throat. There is no family history of angioedema and she has not been on ACE inhibitors. She avoids known foods that cause problems in the past. 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 None clearly in significant range. She did not take Benadryl as recommended that the emergency room because of listed side effect of sleepiness. We discussed alternatives. She has an EpiPen if needed.  08/22/12-  72 yoF former smoker followed for OSA/ failed CPAP, asthma, allergic rhinitis, Food Allergy complicated by DM, hx breast cancer Cough-clear in color; using rescue inhaler more than usual; Increased cough lying in  bed and aware of reflux. Insomnia wakes her around 2 AM but she does not think that is from coughing. She has been irregular using Symbicort, trying to use it as a rescue inhaler. We discussed that. Some increase in exertional dyspnea.  12/23/12- 72 yoF former smoker followed for OSA/ failed  CPAP, asthma, allergic rhinitis, Food Allergy, GERD, complicated by DM, hx breast cancer/ XRT FOLLOWS FOR: whole month of December has had congestion-cough-clear in color;some of its related to reflux and some due to the weather per patient. Had a cold. Chest congestion with clear mucus. Using rescue inhaler twice daily now but has not needed nebulizer. Continues Symbicort. Nothing purulent and no fever or blood. Sonata not enough to keep her asleep. Failed NyQuil. Afraid of sleepwalking with Ambien. Snoring status unclear  Review of Systems-See HPI Constitutional:   No-   weight loss, night sweats, fevers, chills, fatigue, lassitude. HEENT:   No-  headaches, difficulty swallowing, tooth/dental problems, sore throat,       No-  sneezing, +itching, ear ache, nasal congestion, post nasal drip,  CV:  No-   chest pain, orthopnea, PND, swelling in lower extremities, anasarca, dizziness, palpitations Resp: + shortness of breath with exertion or at rest.              +  productive cough,  + non-productive cough,  No- coughing up of blood.              No-   change in color of mucus.  No- wheezing.   Skin: No-   rash or lesions. GI:  +   heartburn, indigestion, no-abdominal pain, nausea, vomiting, GU:  MS:  No-   joint pain or swelling. . Neuro-     nothing unusual Psych:  No- change in mood or affect. No depression or anxiety.  No memory loss.  Objective:   Physical Exam General- Alert, Oriented, Affect-appropriate, Distress- none acute    Overweight.  Skin- clear Lymphadenopathy- none Head- atraumatic            Eyes- Gross vision intact, PERRLA, conjunctivae clear secretions            Ears- Hearing, canals normal            Nose- Clear, no-Septal dev, mucus, polyps, erosion, perforation             Throat- Mallampati III , mucosa clear , drainage- none, tonsils- atrophic Neck- flexible , trachea midline, no stridor , thyroid nl, carotid no bruit Chest - symmetrical excursion , unlabored            Heart/CV- RRR , no murmur , no gallop  , no rub, nl s1 s2                           - JVD- none , edema- none, stasis changes- none, varices- none           Lung- + mild wheeze and wheezy cough, unlabored,  dullness-none, rub- none           Chest wall-  Abd-  Br/ Gen/ Rectal- Not done, not indicated Extrem- cyanosis- none, clubbing, none, atrophy- none, strength- nl Neuro- grossly intact to observation

## 2012-12-23 NOTE — Patient Instructions (Addendum)
Order- CXR    Dx asthma with bronchitis, hx breast Ca  Depo 80  Neb xop 0.63  Script for temazepam for sleep as needed  Please call as needed

## 2012-12-26 ENCOUNTER — Ambulatory Visit (INDEPENDENT_AMBULATORY_CARE_PROVIDER_SITE_OTHER)
Admission: RE | Admit: 2012-12-26 | Discharge: 2012-12-26 | Disposition: A | Discharge status: Home / Self Care | Payer: Medicare PPO | Source: Ambulatory Visit | Attending: Internal Medicine | Admitting: Internal Medicine

## 2012-12-26 DIAGNOSIS — Z853 Personal history of malignant neoplasm of breast: Secondary | ICD-10-CM

## 2012-12-26 DIAGNOSIS — J45909 Unspecified asthma, uncomplicated: Secondary | ICD-10-CM

## 2012-12-26 MED ORDER — LEVALBUTEROL HCL 0.63 MG/3ML IN NEBU
0.6300 mg | INHALATION_SOLUTION | Freq: Once | RESPIRATORY_TRACT | Status: AC
  Administered 2012-12-26: 0.63 mg via RESPIRATORY_TRACT

## 2012-12-26 MED ORDER — METHYLPREDNISOLONE ACETATE 80 MG/ML IJ SUSP
80.0000 mg | Freq: Once | INTRAMUSCULAR | Status: AC
  Administered 2012-12-26: 80 mg via INTRAMUSCULAR

## 2012-12-27 ENCOUNTER — Other Ambulatory Visit: Payer: Self-pay | Admitting: Oncology

## 2012-12-29 ENCOUNTER — Other Ambulatory Visit: Payer: Self-pay | Admitting: *Deleted

## 2012-12-29 DIAGNOSIS — Z853 Personal history of malignant neoplasm of breast: Secondary | ICD-10-CM

## 2012-12-29 MED ORDER — LETROZOLE 2.5 MG PO TABS: 2.5000 mg | ORAL_TABLET | Freq: Every day | ORAL | Status: DC

## 2012-12-30 ENCOUNTER — Telehealth: Payer: Self-pay | Admitting: Internal Medicine

## 2012-12-30 NOTE — Progress Notes (Signed)
Quick Note:  Spoke with patient informed her of results as listed below per Dr. Maple Hudson, patient verbalized understanding and nothing further needed at this time. ______

## 2012-12-30 NOTE — Telephone Encounter (Signed)
Spoke with patient informed her of results as listed below per Dr. Maple Hudson, patient verbalized understanding and nothing further needed at this time.   Result Note     CXR looks normal, with no active disease

## 2013-01-04 NOTE — Assessment & Plan Note (Signed)
Recent sustained exacerbation probably post viral. Plan-chest x-ray, nebulizer treatment, Depo-Medrol

## 2013-01-04 NOTE — Assessment & Plan Note (Signed)
Fails NyQuil and Sonata, afraid of Ambien Plan-temazepam with discussion. Emphasis on sleep hygiene and reminder about sleep apnea symptoms

## 2013-01-26 ENCOUNTER — Other Ambulatory Visit: Payer: Self-pay | Admitting: Internal Medicine

## 2013-01-27 NOTE — Telephone Encounter (Signed)
Med is not on pt's current med list. OK to refill?

## 2013-01-27 NOTE — Telephone Encounter (Signed)
Called pt, unable to leave a msg.   

## 2013-01-27 NOTE — Telephone Encounter (Signed)
Call pt,  -needs OV within a month, please arrange -if she has been on this med all along, ok to RF x 1 month, otherwise will RF on RTC

## 2013-01-30 ENCOUNTER — Inpatient Hospital Stay (HOSPITAL_BASED_OUTPATIENT_CLINIC_OR_DEPARTMENT_OTHER)
Admission: EM | Admit: 2013-01-30 | Discharge: 2013-02-02 | DRG: 389 | Disposition: A | Discharge status: Home / Self Care | Payer: Medicare PPO | Attending: Internal Medicine | Admitting: Internal Medicine

## 2013-01-30 ENCOUNTER — Encounter (HOSPITAL_BASED_OUTPATIENT_CLINIC_OR_DEPARTMENT_OTHER): Payer: Self-pay | Admitting: *Deleted

## 2013-01-30 ENCOUNTER — Ambulatory Visit (INDEPENDENT_AMBULATORY_CARE_PROVIDER_SITE_OTHER): Payer: Medicare PPO | Admitting: Internal Medicine

## 2013-01-30 ENCOUNTER — Emergency Department (HOSPITAL_BASED_OUTPATIENT_CLINIC_OR_DEPARTMENT_OTHER): Payer: Medicare PPO

## 2013-01-30 VITALS — BP 118/82 | HR 87 | Temp 99.2°F | Wt 187.0 lb

## 2013-01-30 DIAGNOSIS — K5641 Fecal impaction: Secondary | ICD-10-CM

## 2013-01-30 DIAGNOSIS — J4489 Other specified chronic obstructive pulmonary disease: Secondary | ICD-10-CM | POA: Diagnosis present

## 2013-01-30 DIAGNOSIS — Z87891 Personal history of nicotine dependence: Secondary | ICD-10-CM

## 2013-01-30 DIAGNOSIS — B009 Herpesviral infection, unspecified: Secondary | ICD-10-CM

## 2013-01-30 DIAGNOSIS — T7840XA Allergy, unspecified, initial encounter: Secondary | ICD-10-CM

## 2013-01-30 DIAGNOSIS — F5104 Psychophysiologic insomnia: Secondary | ICD-10-CM

## 2013-01-30 DIAGNOSIS — R1032 Left lower quadrant pain: Secondary | ICD-10-CM

## 2013-01-30 DIAGNOSIS — K56609 Unspecified intestinal obstruction, unspecified as to partial versus complete obstruction: Principal | ICD-10-CM | POA: Diagnosis present

## 2013-01-30 DIAGNOSIS — J45998 Other asthma: Secondary | ICD-10-CM

## 2013-01-30 DIAGNOSIS — Z860100 Personal history of colon polyps, unspecified: Secondary | ICD-10-CM

## 2013-01-30 DIAGNOSIS — M899 Disorder of bone, unspecified: Secondary | ICD-10-CM

## 2013-01-30 DIAGNOSIS — M25512 Pain in left shoulder: Secondary | ICD-10-CM

## 2013-01-30 DIAGNOSIS — I10 Essential (primary) hypertension: Secondary | ICD-10-CM

## 2013-01-30 DIAGNOSIS — M949 Disorder of cartilage, unspecified: Secondary | ICD-10-CM | POA: Diagnosis present

## 2013-01-30 DIAGNOSIS — L509 Urticaria, unspecified: Secondary | ICD-10-CM

## 2013-01-30 DIAGNOSIS — K5900 Constipation, unspecified: Secondary | ICD-10-CM | POA: Diagnosis present

## 2013-01-30 DIAGNOSIS — R109 Unspecified abdominal pain: Secondary | ICD-10-CM

## 2013-01-30 DIAGNOSIS — G609 Hereditary and idiopathic neuropathy, unspecified: Secondary | ICD-10-CM

## 2013-01-30 DIAGNOSIS — Z853 Personal history of malignant neoplasm of breast: Secondary | ICD-10-CM

## 2013-01-30 DIAGNOSIS — K5909 Other constipation: Secondary | ICD-10-CM

## 2013-01-30 DIAGNOSIS — E669 Obesity, unspecified: Secondary | ICD-10-CM

## 2013-01-30 DIAGNOSIS — J449 Chronic obstructive pulmonary disease, unspecified: Secondary | ICD-10-CM | POA: Diagnosis present

## 2013-01-30 DIAGNOSIS — E119 Type 2 diabetes mellitus without complications: Secondary | ICD-10-CM | POA: Diagnosis present

## 2013-01-30 DIAGNOSIS — M79609 Pain in unspecified limb: Secondary | ICD-10-CM

## 2013-01-30 DIAGNOSIS — Z79899 Other long term (current) drug therapy: Secondary | ICD-10-CM

## 2013-01-30 DIAGNOSIS — E785 Hyperlipidemia, unspecified: Secondary | ICD-10-CM

## 2013-01-30 DIAGNOSIS — K219 Gastro-esophageal reflux disease without esophagitis: Secondary | ICD-10-CM

## 2013-01-30 DIAGNOSIS — K6389 Other specified diseases of intestine: Secondary | ICD-10-CM

## 2013-01-30 DIAGNOSIS — E871 Hypo-osmolality and hyponatremia: Secondary | ICD-10-CM | POA: Diagnosis present

## 2013-01-30 DIAGNOSIS — Z7982 Long term (current) use of aspirin: Secondary | ICD-10-CM

## 2013-01-30 DIAGNOSIS — J309 Allergic rhinitis, unspecified: Secondary | ICD-10-CM

## 2013-01-30 DIAGNOSIS — G4733 Obstructive sleep apnea (adult) (pediatric): Secondary | ICD-10-CM

## 2013-01-30 LAB — CBC WITH DIFFERENTIAL/PLATELET
Basophils Absolute: 0.1 10*3/uL (ref 0.0–0.1)
Eosinophils Absolute: 0.3 10*3/uL (ref 0.0–0.7)
Lymphs Abs: 1.6 10*3/uL (ref 0.7–4.0)
MCH: 30.9 pg (ref 26.0–34.0)
Neutrophils Relative %: 71 % (ref 43–77)
Platelets: 346 10*3/uL (ref 150–400)
RBC: 4.3 MIL/uL (ref 3.87–5.11)
WBC: 10.2 10*3/uL (ref 4.0–10.5)

## 2013-01-30 LAB — COMPREHENSIVE METABOLIC PANEL
ALT: 13 U/L (ref 0–35)
AST: 25 U/L (ref 0–37)
Albumin: 4.1 g/dL (ref 3.5–5.2)
Alkaline Phosphatase: 96 U/L (ref 39–117)
GFR calc Af Amer: 58 mL/min — ABNORMAL LOW (ref >90)
Glucose, Bld: 101 mg/dL — ABNORMAL HIGH (ref 70–99)
Potassium: 3.6 mEq/L (ref 3.5–5.1)
Sodium: 134 mEq/L — ABNORMAL LOW (ref 135–145)
Total Protein: 7.8 g/dL (ref 6.0–8.3)

## 2013-01-30 LAB — POCT URINALYSIS DIPSTICK
Glucose, UA: NEGATIVE
Protein, UA: NEGATIVE
Urobilinogen, UA: 0.2

## 2013-01-30 MED ORDER — SODIUM CHLORIDE 0.9 % IV BOLUS (SEPSIS)
500.0000 mL | Freq: Once | INTRAVENOUS | Status: AC
  Administered 2013-01-30: 500 mL via INTRAVENOUS

## 2013-01-30 MED ORDER — IOHEXOL 300 MG/ML  SOLN
100.0000 mL | Freq: Once | INTRAMUSCULAR | Status: AC | PRN
  Administered 2013-01-30: 100 mL via INTRAVENOUS

## 2013-01-30 MED ORDER — IOHEXOL 300 MG/ML  SOLN
50.0000 mL | Freq: Once | INTRAMUSCULAR | Status: AC | PRN
  Administered 2013-01-30: 50 mL via ORAL

## 2013-01-30 MED ORDER — HYDROMORPHONE HCL PF 1 MG/ML IJ SOLN
1.0000 mg | Freq: Once | INTRAMUSCULAR | Status: AC
  Administered 2013-01-30: 1 mg via INTRAVENOUS
  Filled 2013-01-30: qty 1

## 2013-01-30 NOTE — ED Notes (Addendum)
C/o left side abd pain-was sent by PCP

## 2013-01-30 NOTE — Progress Notes (Signed)
  Subjective:    Patient ID: [name], female    DOB: February 11, 1941, 72 y.o.   MRN: [id number]  HPI Acute visit Several years history of pain at the left lower back with some radiation to the left lower quadrant, worse since she fell on her back and landed on her left side 10 days ago. Since then the pain is essentially steady. Triggers for her pain are inconsistent, worse w/ certain positions? Better after flatus ? She has chronic constipation, only able to have a BM if she takes milk of magnesia which she takes frequently. She took MOM last night and had a bowel movement this morning however she has the feeling that she still needs to go. We did a Udip  here at the office and it shows some blood.   Past Medical History:   HTN dx 11-2011   Diabetes mellitus, type II   Peripheral neuropathy   Hyperlipidemia   Obesity   OSA CPAP intolerant- NPSG 08/09/03- AHI 66/hr   Asthma, Dx in adulthood (72y/o)   - PFT 09/08/09 FEV1 1.70/81%; FEV1/FVC 0.75; + resp to dilator; Nl LV/ DLCO   breast cancer, hx of, BILATERAL diagnosed in 2006, recurred 2011- Dr Donnie Coffin   Drue Flirt, XRT, Femara   Colonic polyps, hx of   Osteopenia   Ovarian Mass, resolved   Allergic rhinitis   negative stress test in 2004    Past Surgical History:   Appendectomy   MASTECTOMY, BILATERAL, HX OF   ?pilonidal cyst surgery   Review of Systems Denies diarrhea or blood in the stools. Occasionally has low back pain. No fever chills No nausea vomiting No dysuria or gross hematuria     Objective:   Physical Exam General -- alert, well-developed, NAD, VSS .   Neck --no thyromegaly , normal carotid pulse, no LADs HEENT -- not pale  Lungs -- normal respiratory effort, no intercostal retractions, no accessory muscle use, and normal breath sounds.   Heart-- normal rate, regular rhythm, no murmur, and no gallop.   Abdomen-- Seems slightly distended,  mild tenderness in the lower abdomen, increased bowel  sounds. Extremities-- no pretibial edema bilaterally Rectal--  limited due to patient discomfort, brown stools, no blood found. Neurologic-- alert & oriented X3 and strength normal in all extremities. Psych-- Cognition and judgment appear intact. Alert and cooperative with normal attention span and concentration.  not anxious appearing and not depressed appearing.        Assessment & Plan:   72 year old lady with a history of diabetes, breast ca, appendectomy, fecal impaction status post flex sigmoidoscopy 12/2010 who presented with the following issues:  --chief complain is chronic left back and left lower quadrant abdominal pain. Etiology unclear. Gynecology recently did a pelvic ultrasound which was negative. --I also found her slightly distended, increased bowel sounds, early SBO? --Udip w/ ++ blood , no UTI sx , UCX sent. Doubt hematuria is related to recent fall but that is  possible.  At this point, I think she needs urgent labs to determine if there is a early SBO (x-rays, CT?,  CBC, BMP).  I'll refer her to the ER. D/w w/ ER MD Needs to be seen next week for a followup, I would investigate further her chronic left back and left lower quadrant abdominal pain.

## 2013-01-30 NOTE — Patient Instructions (Addendum)
Please go to the [facility], corner of [address] (10 minutes form here), [address]  [place], [place] [address] [phone] You need to some x-rays and labs today. ---- I like to see you next week for followup and further testing if necessary

## 2013-01-30 NOTE — ED Provider Notes (Signed)
History     CSN: [id number]  Arrival date & time 01/30/13  1751   First MD Initiated Contact with Patient 01/30/13 1810      Chief Complaint  Patient presents with  . Abdominal Pain    (Consider location/radiation/quality/duration/timing/severity/associated sxs/prior treatment) HPI Patient complaining of abdominal distension today.  Patient sent from Dr. Leta Jungling office due to abdominal pain and distension.  Last normal bowel movement a year ago. States constipation worse now.  Patient had three loose bowel movements/watery.  Pain worse in left lower abdomen for a week, thinks it occurred after fall a week ago.  Colonoscopy by Dr.Outlaw 2012-virtual.  Past Medical History  Diagnosis Date  . Diabetes mellitus   . Hyperlipidemia   . Asthma     dx in adulthood (72 y/o) PFT 09/08/09 FEV1 1.70/81%; FEV1/FVC 0.75; +resp to dilartor; NI LV/DLCO  . Breast CA     hx of bilateral diagnosied in 2006, recurred 2011- Dr Claria Dice  . Colonic polyp   . Osteopenia   . Mass of ovary     resolved  . Allergic rhinitis   . OSA (obstructive sleep apnea)     CPAP intolerant  . Peripheral neuropathy   . COPD (chronic obstructive pulmonary disease)   . Sore throat   . Visual disturbance   . Cough   . Wheezing   . Abdominal distention   . Abdominal pain   . Constipation     Past Surgical History  Procedure Laterality Date  . Appendectomy    . Mastectomy  2006    bilateral  . Pilonidal cyst surgery      ? of    Family History  Problem Relation Age of Onset  . Breast cancer      GM,sister  . Colon cancer Neg Hx   . Diabetes Brother   . Heart attack      no h/o early diseae  . COPD Mother     smoker  . Hypertension Mother   . Cancer Father     lung    History  Substance Use Topics  . Smoking status: Former Games developer  . Smokeless tobacco: Never Used     Comment: Quit 40+ years ago  . Alcohol Use: No    OB History   Grav Para Term Preterm Abortions TAB SAB Ect Mult  Living                  Review of Systems  Constitutional: Positive for fatigue and unexpected weight change.  HENT: Negative.   Eyes: Positive for visual disturbance.  Respiratory: Positive for shortness of breath.   Cardiovascular: Negative.   Gastrointestinal: Positive for abdominal pain, constipation, abdominal distention and rectal pain. Negative for blood in stool.  Endocrine: Negative.   Genitourinary: Positive for frequency.  Neurological: Negative.   Hematological: Negative.   Psychiatric/Behavioral: Negative.   All other systems reviewed and are negative.    Allergies  Food; Peanut-containing drug products; and Penicillins  Home Medications   Current Outpatient Rx  Name  Route  Sig  Dispense  Refill  . albuterol (ACCUNEB) 0.63 MG/3ML nebulizer solution   Nebulization   Take 1 ampule by nebulization 4 (four) times daily as needed.         Marland Kitchen albuterol (PROVENTIL HFA;VENTOLIN HFA) 108 (90 BASE) MCG/ACT inhaler   Inhalation   Inhale 2 puffs into the lungs every 4 (four) hours as needed for wheezing or shortness of breath.   1 Inhaler  prn   . aspirin 81 MG tablet   Oral   Take 81 mg by mouth daily.          . budesonide-formoterol (SYMBICORT) 160-4.5 MCG/ACT inhaler   Inhalation   Inhale 2 puffs into the lungs 2 (two) times daily. Rinse mouth well. Maintenance inhaler.   1 Inhaler   prn   . diphenhydrAMINE (BENADRYL) 25 MG tablet   Oral   Take 50 mg by mouth daily as needed. For swelling         . EPINEPHrine (EPI-PEN) 0.3 mg/0.3 mL DEVI   Intramuscular   Inject 0.3 mLs (0.3 mg total) into the muscle once.   1 Device   0   . letrozole (FEMARA) 2.5 MG tablet   Oral   Take 1 tablet (2.5 mg total) by mouth daily.   30 tablet   3   . magnesium hydroxide (MILK OF MAGNESIA) 800 MG/5ML suspension   Oral   Take 5 mLs by mouth every evening.           . metFORMIN (GLUCOPHAGE) 1000 MG tablet      TAKE 1 TABLET EVERY DAY   30 tablet   3    . montelukast (SINGULAIR) 10 MG tablet   Oral   Take 1 tablet (10 mg total) by mouth at bedtime.   30 tablet   prn   . multivitamin-iron-minerals-folic acid (CENTRUM) chewable tablet   Oral   Chew 1 tablet by mouth daily.           . temazepam (RESTORIL) 15 MG capsule   Oral   Take 1 capsule (15 mg total) by mouth at bedtime as needed for sleep (1 or 2 as needed for sleep).   30 capsule   0   . triamterene-hydrochlorothiazide (MAXZIDE-25) 37.5-25 MG per tablet      TAKE 1 TABLET BY MOUTH DAILY   30 tablet   3   . zaleplon (SONATA) 5 MG capsule      1 or 2 for sleep if needed   30 capsule   5     BP 142/99  Pulse 91  Temp(Src) 98.7 F (37.1 C) (Oral)  Resp 16  Ht 5\' 4"  (1.626 m)  Wt 187 lb (84.823 kg)  BMI 32.08 kg/m2  SpO2 98%  Physical Exam  Nursing note and vitals reviewed. Constitutional: She appears well-developed and well-nourished.  HENT:  Head: Normocephalic and atraumatic.  Right Ear: External ear normal.  Left Ear: External ear normal.  Mouth/Throat: Oropharynx is clear and moist.  Eyes: Conjunctivae and EOM are normal. Pupils are equal, round, and reactive to light.  Neck: Normal range of motion.  Cardiovascular: Normal rate, regular rhythm, normal heart sounds and intact distal pulses.   Pulmonary/Chest: Effort normal and breath sounds normal.  Abdominal:  Diffusely distended with mild left sided tenderness.    Genitourinary:  Diffusely tender, normal colored stool, no impaction noted.     ED Course  Procedures (including critical care time)  Labs Reviewed  CBC WITH DIFFERENTIAL - Abnormal; Notable for the following:    Monocytes Absolute 1.2 (*)    All other components within normal limits  COMPREHENSIVE METABOLIC PANEL - Abnormal; Notable for the following:    Sodium 134 (*)    Chloride 94 (*)    Glucose, Bld 101 (*)    GFR calc non Af Amer 50 (*)    GFR calc Af Amer 58 (*)    All other components within  normal limits  LIPASE,  BLOOD  OCCULT BLOOD X 1 CARD TO LAB, STOOL  GLUCOSE, CAPILLARY   Ct Abdomen Pelvis W Contrast  01/30/2013  *RADIOLOGY REPORT*  Clinical Data: Abdominal distention.  Constipation.  History of breast cancer and previous appendectomy.  CT ABDOMEN AND PELVIS WITH CONTRAST  Technique:  Multidetector CT imaging of the abdomen and pelvis was performed following the standard protocol during bolus administration of intravenous contrast.  Contrast: 50mL OMNIPAQUE IOHEXOL 300 MG/ML  SOLN, OMNIPAQUE IOHEXOL 300 MG/ML  SOLN  Comparison: Radiographs obtained earlier today and previous CT dated 04/12/2010.  Findings: Again demonstrated is a calculus in the lower pole of the right kidney without hydronephrosis.  This measures 5 mm in maximum diameter.  Normal appearing left kidney, ureters and urinary bladder with no additional calculi seen.  Normal appearing liver, spleen, pancreas, gallbladder adrenal glands, uterus and ovaries.  Diffuse distention of the colon containing gas and feces with air/feces levels.  There is some wall thickening involving a short segment of the sigmoid colon in the right pelvis with a maximum wall thickness of 10.4 mm on image number 54.  This was not previously present.  This portion of the colon is poorly distended with the remainder of the colon normally distended or mildly dilated.  No enlarged lymph nodes.  Minimal atelectasis or scarring at both lung bases.  Lumbar and lower thoracic spine degenerative changes.  IMPRESSION:  1.  Possible distal sigmoid colon mass causing mild partial colon obstruction.  Alternatively, this could represent a pseudo mass due to under distention of this portion of the colon, with mild ileus involving the remainder of the colon.  Correlation with sigmoidoscopy/colonoscopy is recommended. 2.  Stable nonobstructing lower pole right renal calculus.   Original Report Authenticated By: Beckie Salts, M.D.      No diagnosis found.    MDM  Discussed results  with Dr. Julian Reil and plan transfer to med surg bed.  Discussed with patient and voices understanding.        Hilario Quarry, MD 01/30/13 2125

## 2013-01-30 NOTE — ED Notes (Signed)
Pt reports she was seen by PMD and sent to ED for further evaluation of abdominal distention and pain.  Urinalysis performed in PMD office and results are in epic.

## 2013-01-30 NOTE — ED Notes (Signed)
After taking pt's VS she requested to walk again to the bathroom. Upon standing, pt appeared to be unsteady on her feet. I informed pt that I would assist her back to bed and bring her a bedside commode. Pt refused the bedside commode and insisted on walking to the bathroom. I assisted pt in ambulating to the bathroom and into the bathroom. After stepping out and closing the door I heard a bump and upon opening the door I found the pt sitting on the floor. Pt stated "that happened so fast". Pt denied injury and physical assessment was negative. I assisted pt onto the commode and then back to her room. Once in the bed pt was assessed again and no signs of injury were found. Pt again denied injury. Pt asked to inform me immediately if she developed any new pain.

## 2013-01-31 ENCOUNTER — Encounter (HOSPITAL_COMMUNITY): Payer: Self-pay | Admitting: *Deleted

## 2013-01-31 ENCOUNTER — Encounter (HOSPITAL_COMMUNITY)
Admission: EM | Disposition: A | Discharge status: Home / Self Care | Payer: Self-pay | Source: Home / Self Care | Attending: Internal Medicine

## 2013-01-31 DIAGNOSIS — K6389 Other specified diseases of intestine: Secondary | ICD-10-CM

## 2013-01-31 DIAGNOSIS — I10 Essential (primary) hypertension: Secondary | ICD-10-CM

## 2013-01-31 DIAGNOSIS — R1032 Left lower quadrant pain: Secondary | ICD-10-CM

## 2013-01-31 DIAGNOSIS — E119 Type 2 diabetes mellitus without complications: Secondary | ICD-10-CM

## 2013-01-31 HISTORY — PX: FLEXIBLE SIGMOIDOSCOPY: SHX5431

## 2013-01-31 LAB — GLUCOSE, CAPILLARY
Glucose-Capillary: 104 mg/dL — ABNORMAL HIGH (ref 70–99)
Glucose-Capillary: 87 mg/dL (ref 70–99)
Glucose-Capillary: 139 mg/dL — ABNORMAL HIGH (ref 70–99)

## 2013-01-31 SURGERY — SIGMOIDOSCOPY, FLEXIBLE
Anesthesia: Moderate Sedation

## 2013-01-31 MED ORDER — FENTANYL CITRATE 0.05 MG/ML IJ SOLN
INTRAMUSCULAR | Status: AC
  Filled 2013-01-31: qty 2

## 2013-01-31 MED ORDER — MONTELUKAST SODIUM 10 MG PO TABS
10.0000 mg | ORAL_TABLET | Freq: Every day | ORAL | Status: DC
  Administered 2013-01-31 – 2013-02-01 (×2): 10 mg via ORAL
  Filled 2013-01-31 (×3): qty 1

## 2013-01-31 MED ORDER — TRIAMTERENE-HCTZ 37.5-25 MG PO TABS
1.0000 | ORAL_TABLET | Freq: Every day | ORAL | Status: DC
  Administered 2013-01-31 – 2013-02-02 (×3): 1 via ORAL
  Filled 2013-01-31 (×3): qty 1

## 2013-01-31 MED ORDER — ZOLPIDEM TARTRATE 5 MG PO TABS: 5.0000 mg | ORAL_TABLET | Freq: Every evening | ORAL | Status: DC | PRN

## 2013-01-31 MED ORDER — HYDROCODONE-ACETAMINOPHEN 5-325 MG PO TABS
1.0000 | ORAL_TABLET | Freq: Four times a day (QID) | ORAL | Status: DC | PRN
  Administered 2013-01-31 – 2013-02-01 (×3): 1 via ORAL
  Filled 2013-01-31 (×3): qty 1

## 2013-01-31 MED ORDER — SODIUM CHLORIDE 0.9 % IV SOLN
INTRAVENOUS | Status: DC
  Administered 2013-01-31: 500 mL via INTRAVENOUS
  Administered 2013-01-31: 20 mL/h via INTRAVENOUS

## 2013-01-31 MED ORDER — MIDAZOLAM HCL 10 MG/2ML IJ SOLN
INTRAMUSCULAR | Status: DC | PRN
  Administered 2013-01-31 (×2): 2 mg via INTRAVENOUS

## 2013-01-31 MED ORDER — CENTRUM PO CHEW: 1.0000 | CHEWABLE_TABLET | Freq: Every day | ORAL | Status: DC

## 2013-01-31 MED ORDER — LETROZOLE 2.5 MG PO TABS
2.5000 mg | ORAL_TABLET | Freq: Every day | ORAL | Status: DC
  Administered 2013-01-31 – 2013-02-02 (×3): 2.5 mg via ORAL
  Filled 2013-01-31 (×3): qty 1

## 2013-01-31 MED ORDER — ALBUTEROL SULFATE HFA 108 (90 BASE) MCG/ACT IN AERS: 2.0000 | INHALATION_SPRAY | RESPIRATORY_TRACT | Status: DC | PRN

## 2013-01-31 MED ORDER — ALBUTEROL SULFATE (5 MG/ML) 0.5% IN NEBU
2.5000 mg | INHALATION_SOLUTION | Freq: Four times a day (QID) | RESPIRATORY_TRACT | Status: DC | PRN
  Filled 2013-01-31: qty 3

## 2013-01-31 MED ORDER — DIPHENHYDRAMINE HCL 50 MG/ML IJ SOLN
INTRAMUSCULAR | Status: AC
  Filled 2013-01-31: qty 1

## 2013-01-31 MED ORDER — HEPARIN SODIUM (PORCINE) 5000 UNIT/ML IJ SOLN
5000.0000 [IU] | Freq: Three times a day (TID) | INTRAMUSCULAR | Status: DC
  Administered 2013-01-31 – 2013-02-02 (×7): 5000 [IU] via SUBCUTANEOUS
  Filled 2013-01-31 (×9): qty 1

## 2013-01-31 MED ORDER — MAGNESIUM HYDROXIDE 400 MG/5ML PO SUSP: 10.0000 mL | Freq: Every evening | ORAL | Status: DC

## 2013-01-31 MED ORDER — MORPHINE SULFATE 2 MG/ML IJ SOLN
1.0000 mg | Freq: Four times a day (QID) | INTRAMUSCULAR | Status: DC | PRN
  Administered 2013-01-31: 2 mg via INTRAVENOUS
  Filled 2013-01-31: qty 1

## 2013-01-31 MED ORDER — HYDROCORTISONE ACE-PRAMOXINE 1-1 % RE FOAM
1.0000 | Freq: Two times a day (BID) | RECTAL | Status: DC
  Administered 2013-01-31 – 2013-02-02 (×4): 1 via RECTAL
  Filled 2013-01-31 (×2): qty 10

## 2013-01-31 MED ORDER — BUDESONIDE-FORMOTEROL FUMARATE 160-4.5 MCG/ACT IN AERO
2.0000 | INHALATION_SPRAY | Freq: Two times a day (BID) | RESPIRATORY_TRACT | Status: DC
  Administered 2013-01-31 – 2013-02-02 (×5): 2 via RESPIRATORY_TRACT
  Filled 2013-01-31: qty 6

## 2013-01-31 MED ORDER — ASPIRIN EC 81 MG PO TBEC
81.0000 mg | DELAYED_RELEASE_TABLET | Freq: Every day | ORAL | Status: DC
  Administered 2013-01-31 – 2013-02-02 (×3): 81 mg via ORAL
  Filled 2013-01-31 (×3): qty 1

## 2013-01-31 MED ORDER — FENTANYL CITRATE 0.05 MG/ML IJ SOLN
INTRAMUSCULAR | Status: DC | PRN
  Administered 2013-01-31: 10 ug via INTRAVENOUS
  Administered 2013-01-31 (×2): 25 ug via INTRAVENOUS

## 2013-01-31 MED ORDER — DIPHENHYDRAMINE HCL 25 MG PO CAPS: 50.0000 mg | ORAL_CAPSULE | Freq: Every day | ORAL | Status: DC | PRN

## 2013-01-31 MED ORDER — ADULT MULTIVITAMIN W/MINERALS CH
1.0000 | ORAL_TABLET | Freq: Every day | ORAL | Status: DC
  Administered 2013-01-31 – 2013-02-02 (×3): 1 via ORAL
  Filled 2013-01-31 (×3): qty 1

## 2013-01-31 MED ORDER — TEMAZEPAM 15 MG PO CAPS: 15.0000 mg | ORAL_CAPSULE | Freq: Every evening | ORAL | Status: DC | PRN

## 2013-01-31 MED ORDER — INSULIN ASPART 100 UNIT/ML ~~LOC~~ SOLN
0.0000 [IU] | SUBCUTANEOUS | Status: DC
  Administered 2013-01-31: 2 [IU] via SUBCUTANEOUS
  Administered 2013-02-02: 100 [IU] via SUBCUTANEOUS

## 2013-01-31 MED ORDER — FLEET ENEMA 7-19 GM/118ML RE ENEM
1.0000 | ENEMA | Freq: Two times a day (BID) | RECTAL | Status: DC
  Administered 2013-01-31 – 2013-02-01 (×2): 1 via RECTAL
  Filled 2013-01-31 (×7): qty 1

## 2013-01-31 MED ORDER — MIDAZOLAM HCL 5 MG/ML IJ SOLN
INTRAMUSCULAR | Status: AC
  Filled 2013-01-31: qty 2

## 2013-01-31 NOTE — Op Note (Signed)
[facility] [address] [place], [address]   FLEXIBLE SIGMOIDOSCOPY PROCEDURE REPORT  PATIENT: [name], [name]  MR#: [id number] BIRTHDATE: 07-22-1941 , 71  yrs. old GENDER: Female ENDOSCOPIST: Charlott Rakes, MD REFERRED BY:  hospital team PROCEDURE DATE:  01/31/2013 PROCEDURE:     Flexible sigmoidoscopy ASA CLASS:    Class III INDICATIONS: Abdominal pain; Colonic obstruction MEDICATIONS: Fentanyl 60 mcg IV, Versed 4 mg IV  DESCRIPTION OF PROCEDURE:   Rectal exam revealed a fissure at the 6 o'clock position that was very tender. No external hemorrhoids seen. Pediatric colonoscope inserted into an unprepped colon where a large amount of solid stool was seen in the proximal rectum blocking the lumen. The colonoscope was able to advance proximal to the stool mass and in the sigmoid colon was a large amount of solid stool noted preventing visualization of the colonic mucosa. The colonoscope was advanced to the distal transverse colon (70 cm from the anus) where there was a minimal stool noted. Stool was green in color. No evidence of a mass but limited view of distal colon. Retroflexion revealed medium-sized nonthrombosed internal hemorrhoids.        COMPLICATIONS:  None  ENDOSCOPIC IMPRESSION: 1. Fecal impaction with large amount of stool in proximal rectum and distal sigmoid; CT findings likely due to this stool 2. Anal fissure 3. Internal hemorrhoids  RECOMMENDATIONS: Aggressive enemas; Supportive care; Proctofoam for anal fissure; Oral laxatives when enemas start working   _______________________________ eSigned:  Charlott Rakes, MD 01/31/2013 1:20 PM

## 2013-01-31 NOTE — Progress Notes (Signed)
[facility] PROGRESS NOTE  Norie P Dirr MRN:[id number] DOB: 01/10/1941 DOA: 01/30/2013 PCP: Jose Paz, MD  Assessment/Plan: Possible distal sigmoid colon mass with possible mild partial colon obstruction Concern exists for malignancy, especially with h/o colonic polyps and h/o breast cancer in past. Dr. Schooler, GI, to evaluate the patient today with possible colonoscopy. No nausea or vomiting to suggest a high grade obstruction at this time.  Chronic constipation Patient takes Milk of magnesia as needed at home. Discussed with the patient about starting miralax.  DM2 Hold metformin as she got CT with contrast. SSI. Stable.  Hypertension Stable. Continue triamterene-HCTZ.  H/o of breast cancer Continue femara.  H/o colonic polyps Management as indicated above.  Generalized weakness Fall in the ER due to pain medications. Request PT consultation.  Code Status: Full code. Family Communication: Patient updated at bedside.  Disposition Plan: Pending.   Consultants:  GI, Dr. Schooler  Procedures:  As above.  Antibiotics:  None.  HPI/Subjective: Complaining of mild abdominal discomfort. Had questions about colonoscopy.  Objective: Filed Vitals:   01/30/13 2146 01/31/13 0150 01/31/13 0304 01/31/13 0550  BP: 111/60 110/57 105/59 124/66  Pulse: 78 70 65 69  Temp:  98.2 F (36.8 C) 98.5 F (36.9 C) 98.5 F (36.9 C)  TempSrc:  Oral Oral Oral  Resp:  16 16 16  Height:   5' 4" (1.626 m)   Weight:   83.3 kg (183 lb 10.3 oz)   SpO2: 98% 97% 98% 99%    Intake/Output Summary (Last 24 hours) at 01/31/13 0900 Last data filed at 01/31/13 0600  Gross per 24 hour  Intake      0 ml  Output      0 ml  Net      0 ml   Filed Weights   01/30/13 1806 01/31/13 0304  Weight: 84.823 kg (187 lb) 83.3 kg (183 lb 10.3 oz)    Exam: Physical Exam: General: Awake, Oriented, No acute distress. HEENT: EOMI. Neck: Supple CV: S1 and S2 Lungs: Clear to  ascultation bilaterally Abdomen: Soft, Nontender, slightly distended, hypoactive bowel sounds. Ext: Good pulses. Trace edema.  Data Reviewed: Basic Metabolic Panel:  Recent Labs Lab 01/30/13 1840  NA 134*  K 3.6  CL 94*  CO2 27  GLUCOSE 101*  BUN 20  CREATININE 1.10  CALCIUM 10.3   Liver Function Tests:  Recent Labs Lab 01/30/13 1840  AST 25  ALT 13  ALKPHOS 96  BILITOT 0.4  PROT 7.8  ALBUMIN 4.1    Recent Labs Lab 01/30/13 1840  LIPASE 47   No results found for this basename: AMMONIA,  in the last 168 hours CBC:  Recent Labs Lab 01/30/13 1840  WBC 10.2  NEUTROABS 7.2  HGB 13.3  HCT 38.6  MCV 89.8  PLT 346   Cardiac Enzymes: No results found for this basename: CKTOTAL, CKMB, CKMBINDEX, TROPONINI,  in the last 168 hours BNP (last 3 results) No results found for this basename: PROBNP,  in the last 8760 hours CBG:  Recent Labs Lab 01/30/13 2058 01/31/13 0148 01/31/13 0745  GLUCAP 96 104* 87    No results found for this or any previous visit (from the past 240 hour(s)).   Studies: Ct Abdomen Pelvis W Contrast  01/30/2013  *RADIOLOGY REPORT*  Clinical Data: Abdominal distention.  Constipation.  History of breast cancer and previous appendectomy.  CT ABDOMEN AND PELVIS WITH CONTRAST  Technique:  Multidetector CT imaging of the abdomen and pelvis was performed following   the standard protocol during bolus administration of intravenous contrast.  Contrast: 50mL OMNIPAQUE IOHEXOL 300 MG/ML  SOLN, 100mL OMNIPAQUE IOHEXOL 300 MG/ML  SOLN  Comparison: Radiographs obtained earlier today and previous CT dated 04/12/2010.  Findings: Again demonstrated is a calculus in the lower pole of the right kidney without hydronephrosis.  This measures 5 mm in maximum diameter.  Normal appearing left kidney, ureters and urinary bladder with no additional calculi seen.  Normal appearing liver, spleen, pancreas, gallbladder adrenal glands, uterus and ovaries.  Diffuse distention  of the colon containing gas and feces with air/feces levels.  There is some wall thickening involving a short segment of the sigmoid colon in the right pelvis with a maximum wall thickness of 10.4 mm on image number 54.  This was not previously present.  This portion of the colon is poorly distended with the remainder of the colon normally distended or mildly dilated.  No enlarged lymph nodes.  Minimal atelectasis or scarring at both lung bases.  Lumbar and lower thoracic spine degenerative changes.  IMPRESSION:  1.  Possible distal sigmoid colon mass causing mild partial colon obstruction.  Alternatively, this could represent a pseudo mass due to under distention of this portion of the colon, with mild ileus involving the remainder of the colon.  Correlation with sigmoidoscopy/colonoscopy is recommended. 2.  Stable nonobstructing lower pole right renal calculus.   Original Report Authenticated By: Steven Reid, M.D.     Scheduled Meds: . aspirin EC  81 mg Oral Daily  . budesonide-formoterol  2 puff Inhalation BID  . heparin  5,000 Units Subcutaneous Q8H  . insulin aspart  0-15 Units Subcutaneous Q4H  . letrozole  2.5 mg Oral Daily  . magnesium hydroxide  10 mL Oral QPM  . montelukast  10 mg Oral QHS  . multivitamin with minerals  1 tablet Oral Daily  . triamterene-hydrochlorothiazide  1 each Oral Daily   Continuous Infusions:   Principal Problem:   Colonic mass Active Problems:   DIABETES MELLITUS, TYPE II   COLONIC POLYPS, HX OF    Jaison Petraglia A, MD  [facility] Pager [phone]. If 7PM-7AM, please contact night-coverage at [email or web address], password [email or web address] 01/31/2013, 9:00 AM  LOS: 1 day    

## 2013-01-31 NOTE — Brief Op Note (Signed)
Large amount of stool in visualized colon likely source of CT findings. Will start enemas. Proctofoam (or similar product) for anal fissure.

## 2013-01-31 NOTE — Progress Notes (Signed)
PT Cancellation Note  Patient Details Name: [name] MRN: [id number] DOB: [birth year]   Cancelled Treatment:    Reason Eval/Treat Not Completed: Patient at procedure or test/unavailable  (Endo) 01/31/2013  [place] Bing, PT [phone] (pager)   Scotlynn Noyes, Eliseo Gum 01/31/2013, 12:46 PM

## 2013-01-31 NOTE — ED Notes (Signed)
Took patient's blood sugar, result was: 104. Nurse notified.

## 2013-01-31 NOTE — H&P (View-Only) (Signed)
[facility] PROGRESS NOTE  [name] WGN:[id number] DOB: [birth year] DOA: 01/30/2013 PCP: [name], MD  Assessment/Plan: Possible distal sigmoid colon mass with possible mild partial colon obstruction Concern exists for malignancy, especially with h/o colonic polyps and h/o breast cancer in past. Dr. Bosie Clos, GI, to evaluate the patient today with possible colonoscopy. No nausea or vomiting to suggest a high grade obstruction at this time.  Chronic constipation Patient takes Milk of magnesia as needed at home. Discussed with the patient about starting miralax.  DM2 Hold metformin as she got CT with contrast. SSI. Stable.  Hypertension Stable. Continue triamterene-HCTZ.  H/o of breast cancer Continue femara.  H/o colonic polyps Management as indicated above.  Generalized weakness Fall in the ER due to pain medications. Request PT consultation.  Code Status: Full code. Family Communication: Patient updated at bedside.  Disposition Plan: Pending.   Consultants:  GI, Dr. Bosie Clos  Procedures:  As above.  Antibiotics:  None.  HPI/Subjective: Complaining of mild abdominal discomfort. Had questions about colonoscopy.  Objective: Filed Vitals:   01/30/13 2146 01/31/13 0150 01/31/13 0304 01/31/13 0550  BP: 111/60 110/57 105/59 124/66  Pulse: 78 70 65 69  Temp:  98.2 F (36.8 C) 98.5 F (36.9 C) 98.5 F (36.9 C)  TempSrc:  Oral Oral Oral  Resp:  16 16 16   Height:   5\' 4"  (1.626 m)   Weight:   83.3 kg (183 lb 10.3 oz)   SpO2: 98% 97% 98% 99%    Intake/Output Summary (Last 24 hours) at 01/31/13 0900 Last data filed at 01/31/13 0600  Gross per 24 hour  Intake      0 ml  Output      0 ml  Net      0 ml   Filed Weights   01/30/13 1806 01/31/13 0304  Weight: 84.823 kg (187 lb) 83.3 kg (183 lb 10.3 oz)    Exam: Physical Exam: General: Awake, Oriented, No acute distress. HEENT: EOMI. Neck: Supple CV: S1 and S2 Lungs: Clear to  ascultation bilaterally Abdomen: Soft, Nontender, slightly distended, hypoactive bowel sounds. Ext: Good pulses. Trace edema.  Data Reviewed: Basic Metabolic Panel:  Recent Labs Lab 01/30/13 1840  NA 134*  K 3.6  CL 94*  CO2 27  GLUCOSE 101*  BUN 20  CREATININE 1.10  CALCIUM 10.3   Liver Function Tests:  Recent Labs Lab 01/30/13 1840  AST 25  ALT 13  ALKPHOS 96  BILITOT 0.4  PROT 7.8  ALBUMIN 4.1    Recent Labs Lab 01/30/13 1840  LIPASE 47   No results found for this basename: AMMONIA,  in the last 168 hours CBC:  Recent Labs Lab 01/30/13 1840  WBC 10.2  NEUTROABS 7.2  HGB 13.3  HCT 38.6  MCV 89.8  PLT 346   Cardiac Enzymes: No results found for this basename: CKTOTAL, CKMB, CKMBINDEX, TROPONINI,  in the last 168 hours BNP (last 3 results) No results found for this basename: PROBNP,  in the last 8760 hours CBG:  Recent Labs Lab 01/30/13 2058 01/31/13 0148 01/31/13 0745  GLUCAP 96 104* 87    No results found for this or any previous visit (from the past 240 hour(s)).   Studies: Ct Abdomen Pelvis W Contrast  01/30/2013  *RADIOLOGY REPORT*  Clinical Data: Abdominal distention.  Constipation.  History of breast cancer and previous appendectomy.  CT ABDOMEN AND PELVIS WITH CONTRAST  Technique:  Multidetector CT imaging of the abdomen and pelvis was performed following  the standard protocol during bolus administration of intravenous contrast.  Contrast: 50mL OMNIPAQUE IOHEXOL 300 MG/ML  SOLN, OMNIPAQUE IOHEXOL 300 MG/ML  SOLN  Comparison: Radiographs obtained earlier today and previous CT dated 04/12/2010.  Findings: Again demonstrated is a calculus in the lower pole of the right kidney without hydronephrosis.  This measures 5 mm in maximum diameter.  Normal appearing left kidney, ureters and urinary bladder with no additional calculi seen.  Normal appearing liver, spleen, pancreas, gallbladder adrenal glands, uterus and ovaries.  Diffuse distention  of the colon containing gas and feces with air/feces levels.  There is some wall thickening involving a short segment of the sigmoid colon in the right pelvis with a maximum wall thickness of 10.4 mm on image number 54.  This was not previously present.  This portion of the colon is poorly distended with the remainder of the colon normally distended or mildly dilated.  No enlarged lymph nodes.  Minimal atelectasis or scarring at both lung bases.  Lumbar and lower thoracic spine degenerative changes.  IMPRESSION:  1.  Possible distal sigmoid colon mass causing mild partial colon obstruction.  Alternatively, this could represent a pseudo mass due to under distention of this portion of the colon, with mild ileus involving the remainder of the colon.  Correlation with sigmoidoscopy/colonoscopy is recommended. 2.  Stable nonobstructing lower pole right renal calculus.   Original Report Authenticated By: Beckie Salts, M.D.     Scheduled Meds: . aspirin EC  81 mg Oral Daily  . budesonide-formoterol  2 puff Inhalation BID  . heparin  5,000 Units Subcutaneous Q8H  . insulin aspart  0-15 Units Subcutaneous Q4H  . letrozole  2.5 mg Oral Daily  . magnesium hydroxide  10 mL Oral QPM  . montelukast  10 mg Oral QHS  . multivitamin with minerals  1 tablet Oral Daily  . triamterene-hydrochlorothiazide  1 each Oral Daily   Continuous Infusions:   Principal Problem:   Colonic mass Active Problems:   DIABETES MELLITUS, TYPE II   COLONIC POLYPS, HX OF    Jimi Giza A, MD  [facility] Pager [phone]. If 7PM-7AM, please contact night-coverage at [email or web address], password [email or web address] 01/31/2013, 9:00 AM  LOS: 1 day

## 2013-01-31 NOTE — Progress Notes (Signed)
Admitted patient to room 6743. Alert and oriented x 3-4. Skin warm and dry. Respiration even and unlabored. Denies pain or discomfort. S/P fall in [facility] emergency department. Needs attended. Abdomen distended non tender. Patient stated she been taking MOM to make her bowels moved. Hyperactive bowel sounds noted.

## 2013-01-31 NOTE — H&P (Signed)
[facility] History and Physical  LAYONNA DOBIE ZOX:[id number] DOB: 1941/05/03 DOA: 01/30/2013  Referring physician: ED PCP: [name], MD  Specialists: Dr. Dulce Sellar  Chief Complaint: Abdominal pain  HPI: [name] is a 72 y.o. female who presented to [facility] with c/o abdominal pain and distention after being sent over from Dr. Leta Jungling office.  She notes that this pain, located in her LLQ has been getting worse for the past week.  Three loose watery BMS.  Constipation is usual for the patient for the past year she notes.  In the ED, CT scan of the patients abdomen and pelvis suggested a very low grade colonic obstruction due to possibly a colonic mass.  Colonoscopy was recommended (patients last colonoscopy was in 2012 by Dr. Dulce Sellar virtual colonoscopy).  Patient has been sent to [facility] for admission.  Review of Systems: 12 systems reviewed and otherwise negative.  Past Medical History  Diagnosis Date  . Diabetes mellitus   . Hyperlipidemia   . Asthma     dx in adulthood (72 y/o) PFT 09/08/09 FEV1 1.70/81%; FEV1/FVC 0.75; +resp to dilartor; NI LV/DLCO  . Breast CA     hx of bilateral diagnosied in 2006, recurred 2011- Dr Claria Dice  . Colonic polyp   . Osteopenia   . Mass of ovary     resolved  . Allergic rhinitis   . OSA (obstructive sleep apnea)     CPAP intolerant  . Peripheral neuropathy   . COPD (chronic obstructive pulmonary disease)   . Sore throat   . Visual disturbance   . Cough   . Wheezing   . Abdominal distention   . Abdominal pain   . Constipation    Past Surgical History  Procedure Laterality Date  . Appendectomy    . Mastectomy  2006    bilateral  . Pilonidal cyst surgery      ? of   Social History:  reports that she has quit smoking. She has never used smokeless tobacco. She reports that she does not drink alcohol or use illicit drugs.   Allergies  Allergen Reactions  . Food     nuts  . Peanut-Containing Drug Products    . Penicillins Other (See Comments)    REACTION: outer rectal itching    Family History  Problem Relation Age of Onset  . Breast cancer      GM,sister  . Colon cancer Neg Hx   . Diabetes Brother   . Heart attack      no h/o early diseae  . COPD Mother     smoker  . Hypertension Mother   . Cancer Father     lung    Prior to Admission medications   Medication Sig Start Date End Date Taking? Authorizing Provider  albuterol (ACCUNEB) 0.63 MG/3ML nebulizer solution Take 1 ampule by nebulization 4 (four) times daily as needed. 06/22/11   Wanda Plump, MD  albuterol (PROVENTIL HFA;VENTOLIN HFA) 108 (90 BASE) MCG/ACT inhaler Inhale 2 puffs into the lungs every 4 (four) hours as needed for wheezing or shortness of breath. 08/22/12 08/22/13  Waymon Budge, MD  aspirin 81 MG tablet Take 81 mg by mouth daily.     Historical Provider, MD  budesonide-formoterol (SYMBICORT) 160-4.5 MCG/ACT inhaler Inhale 2 puffs into the lungs 2 (two) times daily. Rinse mouth well. Maintenance inhaler. 08/22/12 08/22/13  Waymon Budge, MD  diphenhydrAMINE (BENADRYL) 25 MG tablet Take 50 mg by mouth daily as needed. For swelling  Historical Provider, MD  EPINEPHrine (EPI-PEN) 0.3 mg/0.3 mL DEVI Inject 0.3 mLs (0.3 mg total) into the muscle once. 06/11/12   Wanda Plump, MD  letrozole [facility]) 2.5 MG tablet Take 1 tablet (2.5 mg total) by mouth daily. 12/29/12   Keitha Butte, NP  magnesium hydroxide (MILK OF MAGNESIA) 800 MG/5ML suspension Take 5 mLs by mouth every evening.      Historical Provider, MD  metFORMIN (GLUCOPHAGE) 1000 MG tablet TAKE 1 TABLET EVERY DAY 11/14/12   Wanda Plump, MD  montelukast (SINGULAIR) 10 MG tablet Take 1 tablet (10 mg total) by mouth at bedtime. 08/22/12 08/22/13  Waymon Budge, MD  multivitamin-iron-minerals-folic acid (CENTRUM) chewable tablet Chew 1 tablet by mouth daily.      Historical Provider, MD  temazepam (RESTORIL) 15 MG capsule Take 1 capsule (15 mg total) by mouth at bedtime  as needed for sleep (1 or 2 as needed for sleep). 12/23/12 12/23/13  Waymon Budge, MD  triamterene-hydrochlorothiazide (MAXZIDE-25) 37.5-25 MG per tablet TAKE 1 TABLET BY MOUTH DAILY 11/03/12   Wanda Plump, MD  zaleplon (SONATA) 5 MG capsule 1 or 2 for sleep if needed 08/22/12 08/22/13  Waymon Budge, MD   Physical Exam: Filed Vitals:   01/30/13 2146 01/31/13 0150 01/31/13 0304 01/31/13 0550  BP: 111/60 110/57 105/59 124/66  Pulse: 78 70 65 69  Temp:  98.2 F (36.8 C) 98.5 F (36.9 C) 98.5 F (36.9 C)  TempSrc:  Oral Oral Oral  Resp:  16 16 16   Height:   5\' 4"  (1.626 m)   Weight:   83.3 kg (183 lb 10.3 oz)   SpO2: 98% 97% 98% 99%    General:  NAD, resting comfortably in bed Eyes: PEERLA EOMI ENT: mucous membranes moist Neck: supple w/o JVD Cardiovascular: RRR w/o MRG Respiratory: CTA B Abdomen: soft, mild left sided tenderness, diffusely distended, bs+ Skin: no rash nor lesion Musculoskeletal: MAE, full ROM all 4 extremities Psychiatric: normal tone and affect Neurologic: AAOx3, grossly non-focal  Labs on Admission:  Basic Metabolic Panel:  Recent Labs Lab 01/30/13 1840  NA 134*  K 3.6  CL 94*  CO2 27  GLUCOSE 101*  BUN 20  CREATININE 1.10  CALCIUM 10.3   Liver Function Tests:  Recent Labs Lab 01/30/13 1840  AST 25  ALT 13  ALKPHOS 96  BILITOT 0.4  PROT 7.8  ALBUMIN 4.1    Recent Labs Lab 01/30/13 1840  LIPASE 47   No results found for this basename: AMMONIA,  in the last 168 hours CBC:  Recent Labs Lab 01/30/13 1840  WBC 10.2  NEUTROABS 7.2  HGB 13.3  HCT 38.6  MCV 89.8  PLT 346   Cardiac Enzymes: No results found for this basename: CKTOTAL, CKMB, CKMBINDEX, TROPONINI,  in the last 168 hours  BNP (last 3 results) No results found for this basename: PROBNP,  in the last 8760 hours CBG:  Recent Labs Lab 01/30/13 2058 01/31/13 0148  GLUCAP 96 104*    Radiological Exams on Admission: Ct Abdomen Pelvis W Contrast  01/30/2013   *RADIOLOGY REPORT*  Clinical Data: Abdominal distention.  Constipation.  History of breast cancer and previous appendectomy.  CT ABDOMEN AND PELVIS WITH CONTRAST  Technique:  Multidetector CT imaging of the abdomen and pelvis was performed following the standard protocol during bolus administration of intravenous contrast.  Contrast: 50mL OMNIPAQUE IOHEXOL 300 MG/ML  SOLN, OMNIPAQUE IOHEXOL 300 MG/ML  SOLN  Comparison: Radiographs obtained  earlier today and previous CT dated 04/12/2010.  Findings: Again demonstrated is a calculus in the lower pole of the right kidney without hydronephrosis.  This measures 5 mm in maximum diameter.  Normal appearing left kidney, ureters and urinary bladder with no additional calculi seen.  Normal appearing liver, spleen, pancreas, gallbladder adrenal glands, uterus and ovaries.  Diffuse distention of the colon containing gas and feces with air/feces levels.  There is some wall thickening involving a short segment of the sigmoid colon in the right pelvis with a maximum wall thickness of 10.4 mm on image number 54.  This was not previously present.  This portion of the colon is poorly distended with the remainder of the colon normally distended or mildly dilated.  No enlarged lymph nodes.  Minimal atelectasis or scarring at both lung bases.  Lumbar and lower thoracic spine degenerative changes.  IMPRESSION:  1.  Possible distal sigmoid colon mass causing mild partial colon obstruction.  Alternatively, this could represent a pseudo mass due to under distention of this portion of the colon, with mild ileus involving the remainder of the colon.  Correlation with sigmoidoscopy/colonoscopy is recommended. 2.  Stable nonobstructing lower pole right renal calculus.   Original Report Authenticated By: Beckie Salts, M.D.     EKG: Independently reviewed.  Assessment/Plan Principal Problem:   Colonic mass Active Problems:   DIABETES MELLITUS, TYPE II   COLONIC POLYPS, HX  OF   1. Colonic mass - concern exists for malignancy, especially with h/o colonic polyps and h/o breast cancer in past.  Spoke with Dr. Bosie Clos, keeping patient NPO in case they want to scope patient today.  Patient is aware of our concerns regarding this mass.  No nausea nor vomiting to suggest a high grade obstruction requiring NG tube at this time. 2. DM2 - holding metformin as she got CT with contrast and instead putting patient on SSI q4h med dose. 3. H/o colonic polyps 4. H/o breast cancer  Spoke with Dr. Bosie Clos on call for [facility] who will see patient this morning.  Code Status: Full Code (must indicate code status--if unknown or must be presumed, indicate so) Family Communication: No family in room (indicate person spoken with, if applicable, with phone number if by telephone) Disposition Plan: Admit to inpatient (indicate anticipated LOS)  Time spent: 50 min  Maanav Kassabian M. [facility] Pager [phone]  If 7PM-7AM, please contact night-coverage [email or web address] Password [facility] 01/31/2013, 6:44 AM

## 2013-01-31 NOTE — Consult Note (Signed)
Referring Provider: Dr. Julian Reil Primary Care Physician:  [name], MD Primary Gastroenterologist:  Dr. Dulce Sellar  Reason for Consultation:  Colonic obstruction; Constipation; Abdominal pain; Abnormal CT scan  HPI: [name] is a 72 y.o. female with history of chronic constipation who reports falling in her house about 1.5 weeks ago and started having LLQ pain. She usually moves her bowels every 3-4 days and took a laxative yesterday, which led to 3-4 formed stools that day. Denies any rectal bleeding or melena. Has been hospitalized in the past for obstipation. Virtual colonoscopy in 2011 that was suboptimal due to stool burden. Flex sig in 2012 by Dr. Dulce Sellar where anal fissure and stercoral ulcer were noted. Incomplete colon by Dr. Madilyn Fireman in 2006 limited by poor prep. History of adenomatous polyps in 2003. She feels better now and denies abdominal pain. CT showed sigmoid thickening conerning for a mass and partial colonic obstruction.  Past Medical History  Diagnosis Date  . Diabetes mellitus   . Hyperlipidemia   . Asthma     dx in adulthood (72 y/o) PFT 09/08/09 FEV1 1.70/81%; FEV1/FVC 0.75; +resp to dilartor; NI LV/DLCO  . Breast CA     hx of bilateral diagnosied in 2006, recurred 2011- Dr Claria Dice  . Colonic polyp   . Osteopenia   . Mass of ovary     resolved  . Allergic rhinitis   . OSA (obstructive sleep apnea)     CPAP intolerant  . Peripheral neuropathy   . COPD (chronic obstructive pulmonary disease)   . Sore throat   . Visual disturbance   . Cough   . Wheezing   . Abdominal distention   . Abdominal pain   . Constipation     Past Surgical History  Procedure Laterality Date  . Appendectomy    . Mastectomy  2006    bilateral  . Pilonidal cyst surgery      ? of    Prior to Admission medications   Medication Sig Start Date End Date Taking? Authorizing Provider  albuterol (ACCUNEB) 0.63 MG/3ML nebulizer solution Take 1 ampule by nebulization 4  (four) times daily as needed. 06/22/11   Wanda Plump, MD  albuterol (PROVENTIL HFA;VENTOLIN HFA) 108 (90 BASE) MCG/ACT inhaler Inhale 2 puffs into the lungs every 4 (four) hours as needed for wheezing or shortness of breath. 08/22/12 08/22/13  Waymon Budge, MD  aspirin 81 MG tablet Take 81 mg by mouth daily.     Historical Provider, MD  budesonide-formoterol (SYMBICORT) 160-4.5 MCG/ACT inhaler Inhale 2 puffs into the lungs 2 (two) times daily. Rinse mouth well. Maintenance inhaler. 08/22/12 08/22/13  Waymon Budge, MD  diphenhydrAMINE (BENADRYL) 25 MG tablet Take 50 mg by mouth daily as needed. For swelling    Historical Provider, MD  EPINEPHrine (EPI-PEN) 0.3 mg/0.3 mL DEVI Inject 0.3 mLs (0.3 mg total) into the muscle once. 06/11/12   Wanda Plump, MD  letrozole [facility]) 2.5 MG tablet Take 1 tablet (2.5 mg total) by mouth daily. 12/29/12   Keitha Butte, NP  magnesium hydroxide (MILK OF MAGNESIA) 800 MG/5ML suspension Take 5 mLs by mouth every evening.      Historical Provider, MD  metFORMIN (GLUCOPHAGE) 1000 MG tablet TAKE 1 TABLET EVERY DAY 11/14/12   Wanda Plump, MD  montelukast (SINGULAIR) 10 MG tablet Take 1 tablet (10 mg total) by mouth at bedtime. 08/22/12 08/22/13  Waymon Budge, MD  multivitamin-iron-minerals-folic acid (CENTRUM) chewable tablet Chew 1 tablet by  mouth daily.      Historical Provider, MD  temazepam (RESTORIL) 15 MG capsule Take 1 capsule (15 mg total) by mouth at bedtime as needed for sleep (1 or 2 as needed for sleep). 12/23/12 12/23/13  Waymon Budge, MD  triamterene-hydrochlorothiazide (MAXZIDE-25) 37.5-25 MG per tablet TAKE 1 TABLET BY MOUTH DAILY 11/03/12   Wanda Plump, MD  zaleplon (SONATA) 5 MG capsule 1 or 2 for sleep if needed 08/22/12 08/22/13  Waymon Budge, MD    Scheduled Meds: . aspirin EC  81 mg Oral Daily  . budesonide-formoterol  2 puff Inhalation BID  . heparin  5,000 Units Subcutaneous Q8H  . insulin aspart  0-15 Units Subcutaneous Q4H  . letrozole  2.5  mg Oral Daily  . magnesium hydroxide  10 mL Oral QPM  . montelukast  10 mg Oral QHS  . multivitamin with minerals  1 tablet Oral Daily  . triamterene-hydrochlorothiazide  1 each Oral Daily   Continuous Infusions:  PRN Meds:.albuterol, albuterol, diphenhydrAMINE, HYDROcodone-acetaminophen, morphine injection, zolpidem  Allergies as of 01/30/2013 - Review Complete 01/30/2013  Allergen Reaction Noted  . Food  04/04/2012  . Peanut-containing drug products  06/09/2012  . Penicillins Other (See Comments) 08/22/2005    Family History  Problem Relation Age of Onset  . Breast cancer      GM,sister  . Colon cancer Neg Hx   . Diabetes Brother   . Heart attack      no h/o early diseae  . COPD Mother     smoker  . Hypertension Mother   . Cancer Father     lung    History   Social History  . Marital Status: Widowed    Spouse Name: N/A    Number of Children: N/A  . Years of Education: N/A   Occupational History  . Not on file.   Social History Main Topics  . Smoking status: Former Games developer  . Smokeless tobacco: Never Used     Comment: Quit 40+ years ago  . Alcohol Use: No  . Drug Use: No  . Sexually Active: Not Currently   Other Topics Concern  . Not on file   Social History Narrative  . No narrative on file    Review of Systems: All negative except as stated above in HPI.  Physical Exam: Vital signs: Filed Vitals:   01/31/13 0550  BP: 124/66  Pulse: 69  Temp: 98.5 F (36.9 C)  Resp: 16   Last BM Date: 01/30/13 General:   Alert,  Well-developed, well-nourished, pleasant and cooperative in NAD HEENT: anicteric Neck: supple, nontender Lungs:  Clear throughout to auscultation.   No wheezes, crackles, or rhonchi. No acute distress. Heart:  Regular rate and rhythm; no murmurs, clicks, rubs,  or gallops. Abdomen: distended, high-pitched bowel sounds, nontender to palpation, soft Rectal:  Deferred  GI:  Lab Results:  Recent Labs  01/30/13 1840  WBC 10.2   HGB 13.3  HCT 38.6  PLT 346   BMET  Recent Labs  01/30/13 1840  NA 134*  K 3.6  CL 94*  CO2 27  GLUCOSE 101*  BUN 20  CREATININE 1.10  CALCIUM 10.3   LFT  Recent Labs  01/30/13 1840  PROT 7.8  ALBUMIN 4.1  AST 25  ALT 13  ALKPHOS 96  BILITOT 0.4   PT/INR No results found for this basename: LABPROT, INR,  in the last 72 hours   Studies/Results: Ct Abdomen Pelvis W Contrast  01/30/2013  *  RADIOLOGY REPORT*  Clinical Data: Abdominal distention.  Constipation.  History of breast cancer and previous appendectomy.  CT ABDOMEN AND PELVIS WITH CONTRAST  Technique:  Multidetector CT imaging of the abdomen and pelvis was performed following the standard protocol during bolus administration of intravenous contrast.  Contrast: 50mL OMNIPAQUE IOHEXOL 300 MG/ML  SOLN, OMNIPAQUE IOHEXOL 300 MG/ML  SOLN  Comparison: Radiographs obtained earlier today and previous CT dated 04/12/2010.  Findings: Again demonstrated is a calculus in the lower pole of the right kidney without hydronephrosis.  This measures 5 mm in maximum diameter.  Normal appearing left kidney, ureters and urinary bladder with no additional calculi seen.  Normal appearing liver, spleen, pancreas, gallbladder adrenal glands, uterus and ovaries.  Diffuse distention of the colon containing gas and feces with air/feces levels.  There is some wall thickening involving a short segment of the sigmoid colon in the right pelvis with a maximum wall thickness of 10.4 mm on image number 54.  This was not previously present.  This portion of the colon is poorly distended with the remainder of the colon normally distended or mildly dilated.  No enlarged lymph nodes.  Minimal atelectasis or scarring at both lung bases.  Lumbar and lower thoracic spine degenerative changes.  IMPRESSION:  1.  Possible distal sigmoid colon mass causing mild partial colon obstruction.  Alternatively, this could represent a pseudo mass due to under distention of  this portion of the colon, with mild ileus involving the remainder of the colon.  Correlation with sigmoidoscopy/colonoscopy is recommended. 2.  Stable nonobstructing lower pole right renal calculus.   Original Report Authenticated By: Beckie Salts, M.D.     Impression/Plan: 72 yo with history of chronic constipation presenting with LLQ pain for past 1.5 weeks and physical exam and CT concerning for intestinal obstruction. Doubt colonic mass with flex sig in 2012 but due to concern for worsening obstruction if mass present will do flex sig. Very likely the findings on CT of wall thickening are reactive to obstipation from stool. If fecal impaction present or high stool burden on flex sig, then will need aggressive fleets and tap water enemas to evacuate bowel. Would be hesitant to give oral colon prep with high-pitched bowel sounds consistent with a partial obstruction. No N/V at this time and no evidence of a small bowel obstruction.    LOS: 1 day   Sirron Francesconi C.  01/31/2013, 9:24 AM

## 2013-01-31 NOTE — Interval H&P Note (Signed)
History and Physical Interval Note:  01/31/2013 12:50 PM  [name]  has presented today for surgery, with the diagnosis of colonic mass  The various methods of treatment have been discussed with the patient and family. After consideration of risks, benefits and other options for treatment, the patient has consented to  Procedure(s) with comments: FLEXIBLE SIGMOIDOSCOPY (N/A) - unprepped with sedation as a surgical intervention .  The patient's history has been reviewed, patient examined, no change in status, stable for surgery.  I have reviewed the patient's chart and labs.  Questions were answered to the patient's satisfaction.     Donaldson Richter C.

## 2013-02-01 DIAGNOSIS — K219 Gastro-esophageal reflux disease without esophagitis: Secondary | ICD-10-CM

## 2013-02-01 DIAGNOSIS — K5909 Other constipation: Secondary | ICD-10-CM

## 2013-02-01 LAB — BASIC METABOLIC PANEL
BUN: 8 mg/dL (ref 6–23)
Chloride: 91 mEq/L — ABNORMAL LOW (ref 96–112)
GFR calc non Af Amer: 83 mL/min — ABNORMAL LOW (ref >90)
Glucose, Bld: 134 mg/dL — ABNORMAL HIGH (ref 70–99)
Potassium: 3.2 mEq/L — ABNORMAL LOW (ref 3.5–5.1)
Sodium: 129 mEq/L — ABNORMAL LOW (ref 135–145)

## 2013-02-01 LAB — GLUCOSE, CAPILLARY
Glucose-Capillary: 102 mg/dL — ABNORMAL HIGH (ref 70–99)
Glucose-Capillary: 125 mg/dL — ABNORMAL HIGH (ref 70–99)

## 2013-02-01 LAB — URINE CULTURE: Colony Count: 50000

## 2013-02-01 LAB — CBC
Hemoglobin: 12.8 g/dL (ref 12.0–15.0)
MCH: 30.5 pg (ref 26.0–34.0)
MCHC: 34.9 g/dL (ref 30.0–36.0)

## 2013-02-01 MED ORDER — POTASSIUM CHLORIDE CRYS ER 20 MEQ PO TBCR
40.0000 meq | EXTENDED_RELEASE_TABLET | Freq: Once | ORAL | Status: AC
  Administered 2013-02-01: 40 meq via ORAL
  Filled 2013-02-01: qty 2

## 2013-02-01 NOTE — Evaluation (Signed)
Physical Therapy Evaluation Patient Details Name: [name] MRN: [id number] DOB: 01/28/1941 Today's Date: 02/01/2013 Time: 1352-1410 PT Time Calculation (min): 18 min  PT Assessment / Plan / Recommendation Clinical Impression  Patient is a 72 yo female admitted with abdominal pain - bowel obstruction.  Patient is independent with all mobility and gait.  No acute PT needs identified at this time.  PT will sign off.  Encouraged ambulation with nursing in hallway.    PT Assessment  Patent does not need any further PT services    Follow Up Recommendations  No PT follow up    Does the patient have the potential to tolerate intense rehabilitation      Barriers to Discharge        Equipment Recommendations  None recommended by PT    Recommendations for Other Services     Frequency      Precautions / Restrictions Precautions Precautions: None Restrictions Weight Bearing Restrictions: No   Pertinent Vitals/Pain       Mobility  Bed Mobility Bed Mobility: Supine to Sit;Sit to Supine Supine to Sit: 7: Independent;HOB flat Sit to Supine: 7: Independent;HOB flat Details for Bed Mobility Assistance: No cues or assist needed Transfers Transfers: Sit to Stand;Stand to Sit Sit to Stand: 7: Independent;From bed Stand to Sit: 7: Independent;To bed Details for Transfer Assistance: Uses safe technique. Ambulation/Gait Ambulation/Gait Assistance: 7: Independent Ambulation Distance (Feet): 48 Feet (In room laps - staying close to bathroom) Assistive device: None Ambulation/Gait Assistance Details: Patient with good gait pattern and balance. Gait Pattern: Within Functional Limits      PT Goals  N/A  Visit Information  Last PT Received On: 02/01/13 Assistance Needed: +1    Subjective Data  Subjective: "I've been walking around today" Patient Stated Goal: To go home soon   Prior Functioning  Home Living Lives With: Alone Available Help at Discharge:  Family;Friend(s);Available PRN/intermittently Type of Home: House Home Access: Stairs to enter [employer] of Steps: 1 Entrance Stairs-Rails: None Home Layout: One level Bathroom Shower/Tub: Health visitor: Standard Home Adaptive Equipment: None Prior Function Level of Independence: Independent Able to Take Stairs?: Yes Driving: Yes Vocation: Agricultural consultant work (Consistent part-time) Musician: No difficulties    Copywriter, advertising Overall Cognitive Status: Appears within functional limits for tasks assessed/performed Arousal/Alertness: Awake/alert Orientation Level: Oriented X4 / Intact Behavior During Session: WFL for tasks performed    Extremity/Trunk Assessment Right Upper Extremity Assessment RUE ROM/Strength/Tone: WFL for tasks assessed RUE Sensation: WFL - Light Touch Left Upper Extremity Assessment LUE ROM/Strength/Tone: WFL for tasks assessed LUE Sensation: WFL - Light Touch Right Lower Extremity Assessment RLE ROM/Strength/Tone: WFL for tasks assessed RLE Sensation: WFL - Light Touch Left Lower Extremity Assessment LLE ROM/Strength/Tone: WFL for tasks assessed LLE Sensation: WFL - Light Touch   Balance Balance Balance Assessed: Yes High Level Balance High Level Balance Activites: Turns;Sudden stops;Head turns High Level Balance Comments: No loss of balance with high level balance activities.  End of Session PT - End of Session Activity Tolerance: Patient tolerated treatment well Patient left: in bed;with call bell/phone within reach Nurse Communication: Mobility status (Encouraged ambulation in hallway with nursing)  GP     Vena Austria 02/01/2013, 2:35 PM Durenda Hurt. Renaldo Fiddler, [facility] Acute [facility]hab Services Pager [phone]

## 2013-02-01 NOTE — Progress Notes (Signed)
[facility] PROGRESS NOTE  [name] ZOX:[id number] DOB: 04/30/1941 DOA: 01/30/2013 PCP: [name], MD  Assessment/Plan: Fecal impaction with large amount of stool in proximal and rectum and distal sigmoid possible mild partial colon obstruction CT findings likely due to large stool burden. Dr. Bosie Clos, GI, input appreciated. Continue enemas and clear liquids diet.   Chronic constipation Management as indicated above, start stool softners once partial obstruction is improved.  DM2 Hold metformin as she got CT with contrast. SSI. Stable.  Hypertension Stable. Continue triamterene-HCTZ.  Hypokalemia Replace as needed.  Hyponatremia Continue to monitor.  H/o of breast cancer Continue femara.  H/o colonic polyps Management as indicated above.  Generalized weakness Fall in the ER due to pain medications. Continue PT.  Code Status: Full code. Family Communication: Patient updated at bedside.  Disposition Plan: Pending.   Consultants:  GI, Dr. Bosie Clos  Procedures:  As above.  Antibiotics:  None.  HPI/Subjective: Abdominal discomfort still persistent but improved from yesterday. Had some bowel movements yesterday, but still feels full. Complaining of left shoulder pain from the way she slept.  Objective: Filed Vitals:   01/31/13 1350 01/31/13 1956 01/31/13 2014 02/01/13 0411  BP: 116/70  113/75 104/62  Pulse:  68 77 56  Temp:   98.4 F (36.9 C) 98.5 F (36.9 C)  TempSrc:   Oral Oral  Resp: 20 18 18 18   Height:      Weight:   83.507 kg (184 lb 1.6 oz)   SpO2: 96%  98% 97%    Intake/Output Summary (Last 24 hours) at 02/01/13 8119 Last data filed at 01/31/13 2300  Gross per 24 hour  Intake    490 ml  Output      0 ml  Net    490 ml   Filed Weights   01/30/13 1806 01/31/13 0304 01/31/13 2014  Weight: 84.823 kg (187 lb) 83.3 kg (183 lb 10.3 oz) 83.507 kg (184 lb 1.6 oz)    Exam: Physical Exam: General: Awake, Oriented, No acute  distress. HEENT: EOMI. Neck: Supple CV: S1 and S2 Lungs: Clear to ascultation bilaterally Abdomen: Soft, Nontender, slightly distended, high pitched bowel sounds. Ext: Good pulses. Trace edema.  Data Reviewed: Basic Metabolic Panel:  Recent Labs Lab 01/30/13 1840 02/01/13 0525  NA 134* 129*  K 3.6 3.2*  CL 94* 91*  CO2 27 28  GLUCOSE 101* 134*  BUN 20 8  CREATININE 1.10 0.76  CALCIUM 10.3 9.9   Liver Function Tests:  Recent Labs Lab 01/30/13 1840  AST 25  ALT 13  ALKPHOS 96  BILITOT 0.4  PROT 7.8  ALBUMIN 4.1    Recent Labs Lab 01/30/13 1840  LIPASE 47   No results found for this basename: AMMONIA,  in the last 168 hours CBC:  Recent Labs Lab 01/30/13 1840 02/01/13 0525  WBC 10.2 7.1  NEUTROABS 7.2  --   HGB 13.3 12.8  HCT 38.6 36.7  MCV 89.8 87.6  PLT 346 293   Cardiac Enzymes: No results found for this basename: CKTOTAL, CKMB, CKMBINDEX, TROPONINI,  in the last 168 hours BNP (last 3 results) No results found for this basename: PROBNP,  in the last 8760 hours CBG:  Recent Labs Lab 01/31/13 1702 01/31/13 2020 02/01/13 0004 02/01/13 0415 02/01/13 0746  GLUCAP 113* 139* 85 102* 125*    Recent Results (from the past 240 hour(s))  URINE CULTURE     Status: None   Collection Time    01/30/13  5:02  PM      Result Value Range Status   Colony Count 50,000 COLONIES/ML   Final   Organism ID, Bacteria Multiple bacterial morphotypes present, none   Final   Organism ID, Bacteria predominant. Suggest appropriate recollection if    Final   Organism ID, Bacteria clinically indicated.   Final     Studies: Ct Abdomen Pelvis W Contrast  01/30/2013  *RADIOLOGY REPORT*  Clinical Data: Abdominal distention.  Constipation.  History of breast cancer and previous appendectomy.  CT ABDOMEN AND PELVIS WITH CONTRAST  Technique:  Multidetector CT imaging of the abdomen and pelvis was performed following the standard protocol during bolus administration of  intravenous contrast.  Contrast: 50mL OMNIPAQUE IOHEXOL 300 MG/ML  SOLN, OMNIPAQUE IOHEXOL 300 MG/ML  SOLN  Comparison: Radiographs obtained earlier today and previous CT dated 04/12/2010.  Findings: Again demonstrated is Snyder calculus in the lower pole of the right kidney without hydronephrosis.  This measures 5 mm in maximum diameter.  Normal appearing left kidney, ureters and urinary bladder with no additional calculi seen.  Normal appearing liver, spleen, pancreas, gallbladder adrenal glands, uterus and ovaries.  Diffuse distention of the colon containing gas and feces with air/feces levels.  There is some wall thickening involving Snyder short segment of the sigmoid colon in the right pelvis with Snyder maximum wall thickness of 10.4 mm on image number 54.  This was not previously present.  This portion of the colon is poorly distended with the remainder of the colon normally distended or mildly dilated.  No enlarged lymph nodes.  Minimal atelectasis or scarring at both lung bases.  Lumbar and lower thoracic spine degenerative changes.  IMPRESSION:  1.  Possible distal sigmoid colon mass causing mild partial colon obstruction.  Alternatively, this could represent Snyder pseudo mass due to under distention of this portion of the colon, with mild ileus involving the remainder of the colon.  Correlation with sigmoidoscopy/colonoscopy is recommended. 2.  Stable nonobstructing lower pole right renal calculus.   Original Report Authenticated By: Beckie Salts, M.D.     Scheduled Meds: . aspirin EC  81 mg Oral Daily  . budesonide-formoterol  2 puff Inhalation BID  . heparin  5,000 Units Subcutaneous Q8H  . hydrocortisone-pramoxine  1 applicator Rectal BID  . insulin aspart  0-15 Units Subcutaneous Q4H  . letrozole  2.5 mg Oral Daily  . montelukast  10 mg Oral QHS  . multivitamin with minerals  1 tablet Oral Daily  . potassium chloride  40 mEq Oral Once  . sodium phosphate  1 enema Rectal BID  .  triamterene-hydrochlorothiazide  1 each Oral Daily   Continuous Infusions: . sodium chloride 500 mL (01/31/13 1235)    Principal Problem:   Colonic mass Active Problems:   DIABETES MELLITUS, TYPE II   COLONIC POLYPS, HX OF    Joanne Lukacs A, MD  [facility] Pager [phone]. If 7PM-7AM, please contact night-coverage at [email or web address], password [facility] 02/01/2013, 8:12 AM  LOS: 2 days

## 2013-02-01 NOTE — Progress Notes (Signed)
Patient ID: [name], female   DOB: June 03, 1941, 72 y.o.   MRN: [id number] [name] Progress Note  [name] 72 y.o. August 26, 1941   Subjective: Had a loose stool yesterday about an hour after an enema. Denies any abdominal pain. Felt nauseous this morning with drinking liquids.  Objective: Vital signs in last 24 hours: Filed Vitals:   02/01/13 0900  BP: 102/55  Pulse: 53  Temp: 98.6 F (37 C)  Resp: 18    Physical Exam: Gen: alert, no acute distress Abd: soft, nontender, nondistended, +BS  Lab Results:  Recent Labs  01/30/13 1840 02/01/13 0525  NA 134* 129*  K 3.6 3.2*  CL 94* 91*  CO2 27 28  GLUCOSE 101* 134*  BUN 20 8  CREATININE 1.10 0.76  CALCIUM 10.3 9.9    Recent Labs  01/30/13 1840  AST 25  ALT 13  ALKPHOS 96  BILITOT 0.4  PROT 7.8  ALBUMIN 4.1    Recent Labs  01/30/13 1840 02/01/13 0525  WBC 10.2 7.1  NEUTROABS 7.2  --   HGB 13.3 12.8  HCT 38.6 36.7  MCV 89.8 87.6  PLT 346 293   No results found for this basename: LABPROT, INR,  in the last 72 hours    Assessment/Plan: Obstipation causing partial large bowel obstruction: flex sig showed large amount of stool and view limited due to stool but no mass seen in area visualized. Continue enemas and consider adding Miralax tomorrow. Keep on clears only today and consider advancing slowly tomorrow. Will need daily Miralax at home instead of MOM for her chronic constipation.   [name] C. 02/01/2013, 11:57 AM

## 2013-02-02 ENCOUNTER — Encounter (HOSPITAL_COMMUNITY): Payer: Self-pay | Admitting: Gastroenterology

## 2013-02-02 DIAGNOSIS — K5641 Fecal impaction: Secondary | ICD-10-CM | POA: Diagnosis present

## 2013-02-02 LAB — GLUCOSE, CAPILLARY
Glucose-Capillary: 101 mg/dL — ABNORMAL HIGH (ref 70–99)
Glucose-Capillary: 102 mg/dL — ABNORMAL HIGH (ref 70–99)
Glucose-Capillary: 95 mg/dL (ref 70–99)

## 2013-02-02 LAB — BASIC METABOLIC PANEL
BUN: 7 mg/dL (ref 6–23)
Chloride: 94 mEq/L — ABNORMAL LOW (ref 96–112)
Creatinine, Ser: 0.71 mg/dL (ref 0.50–1.10)
GFR calc non Af Amer: 85 mL/min — ABNORMAL LOW (ref >90)
Glucose, Bld: 102 mg/dL — ABNORMAL HIGH (ref 70–99)
Potassium: 3.2 mEq/L — ABNORMAL LOW (ref 3.5–5.1)

## 2013-02-02 MED ORDER — POTASSIUM CHLORIDE CRYS ER 20 MEQ PO TBCR
40.0000 meq | EXTENDED_RELEASE_TABLET | Freq: Four times a day (QID) | ORAL | Status: DC
  Administered 2013-02-02 (×2): 40 meq via ORAL
  Filled 2013-02-02: qty 2

## 2013-02-02 MED ORDER — POTASSIUM CHLORIDE CRYS ER 20 MEQ PO TBCR: 20.0000 meq | EXTENDED_RELEASE_TABLET | Freq: Every day | ORAL | Status: DC

## 2013-02-02 MED ORDER — POLYETHYLENE GLYCOL 3350 17 G PO PACK: 17.0000 g | PACK | Freq: Every day | ORAL | Status: DC

## 2013-02-02 MED ORDER — POLYETHYLENE GLYCOL 3350 17 G PO PACK
17.0000 g | PACK | Freq: Every day | ORAL | Status: DC
  Administered 2013-02-02: 17 g via ORAL
  Filled 2013-02-02: qty 1

## 2013-02-02 NOTE — Progress Notes (Signed)
[facility] Progress Note  Subjective: Feels better tolerating solid diet, has had good bowel movements with MiraLAX since her attempted colonoscopy  Objective: Vital signs in last 24 hours: Temp:  [98 F (36.7 C)-99 F (37.2 C)] 98.7 F (37.1 C) (02/24 0402) Pulse Rate:  [53-79] 53 (02/24 0402) Resp:  [18] 18 (02/24 0402) BP: (101-122)/(51-80) 101/51 mmHg (02/24 0402) SpO2:  [96 %-99 %] 97 % (02/24 0402) Weight:  [83.9 kg (184 lb 15.5 oz)] 83.9 kg (184 lb 15.5 oz) (02/23 2013) Weight change: 0.393 kg (13.9 oz)   PE: Unchanged  Lab Results: Results for orders placed during the hospital encounter of 01/30/13 (from the past 24 hour(s))  GLUCOSE, CAPILLARY     Status: Abnormal   Collection Time    02/01/13 11:31 AM      Result Value Range   Glucose-Capillary 122 (*) 70 - 99 mg/dL  GLUCOSE, CAPILLARY     Status: Abnormal   Collection Time    02/01/13  5:50 PM      Result Value Range   Glucose-Capillary 120 (*) 70 - 99 mg/dL   Comment 1 Documented in Chart     Comment 2 Notify RN    GLUCOSE, CAPILLARY     Status: None   Collection Time    02/01/13  8:18 PM      Result Value Range   Glucose-Capillary 99  70 - 99 mg/dL  GLUCOSE, CAPILLARY     Status: Abnormal   Collection Time    02/02/13 12:13 AM      Result Value Range   Glucose-Capillary 102 (*) 70 - 99 mg/dL  GLUCOSE, CAPILLARY     Status: None   Collection Time    02/02/13  4:06 AM      Result Value Range   Glucose-Capillary 95  70 - 99 mg/dL  BASIC METABOLIC PANEL     Status: Abnormal   Collection Time    02/02/13  4:40 AM      Result Value Range   Sodium 134 (*) 135 - 145 mEq/L   Potassium 3.2 (*) 3.5 - 5.1 mEq/L   Chloride 94 (*) 96 - 112 mEq/L   CO2 27  19 - 32 mEq/L   Glucose, Bld 102 (*) 70 - 99 mg/dL   BUN 7  6 - 23 mg/dL   Creatinine, Ser 1.61  0.50 - 1.10 mg/dL   Calcium 09.6  8.4 - 04.5 mg/dL   GFR calc non Af Amer 85 (*) >90 mL/min   GFR calc Af Amer >90  >90 mL/min  MAGNESIUM      Status: None   Collection Time    02/02/13  4:40 AM      Result Value Range   Magnesium 2.2  1.5 - 2.5 mg/dL  GLUCOSE, CAPILLARY     Status: Abnormal   Collection Time    02/02/13  7:53 AM      Result Value Range   Glucose-Capillary 101 (*) 70 - 99 mg/dL    Studies/Results: No results found.    Assessment: Chronic constipation with recent fecal impaction currently doing better with increased doses of osmotic laxatives  Plan: Current plan is if tolerates diet discharge later today. Followup with me when necessary. She will have scheduled surveillance colonoscopies at appropriate time and we will contact her for that. I recommended that she use MiraLAX daily and titrated for effect but she also uses milk of magnesia and that would be acceptable if it seems to  work better for her.    Makenzi Bannister C 02/02/2013, 8:17 AM

## 2013-02-02 NOTE — Progress Notes (Signed)
Utilization review completed.  

## 2013-02-02 NOTE — Discharge Summary (Addendum)
Physician Discharge Summary  [name]:[id number] DOB: 07-08-1941 DOA: 01/30/2013  PCP: [name], MD  Admit date: 01/30/2013 Discharge date: 02/02/2013  Time spent: 25 minutes  Recommendations for Outpatient Follow-up:  Followup with [name], MD (PCP) in 1 week.  Discharge Diagnoses:  Principal Problem:   Fecal impaction Active Problems:   DIABETES MELLITUS, TYPE II   COLONIC POLYPS, HX OF  Discharge Condition: Stable  Diet recommendation: Diabetic diet  Filed Weights   01/31/13 0304 01/31/13 2014 02/01/13 2013  Weight: 83.3 kg (183 lb 10.3 oz) 83.507 kg (184 lb 1.6 oz) 83.9 kg (184 lb 15.5 oz)    History of present illness:  On admission: "[name] is a 72 y.o. female who presented to [facility] with c/o abdominal pain and distention after being sent over from Dr. Leta Jungling office on 01/31/2013. She notes that this pain, located in her LLQ has been getting worse for the past week. Three loose watery BMS. Constipation is usual for the patient for the past year she notes."  Hospital Course:  Fecal impaction with large amount of stool in proximal and rectum and distal sigmoid possible mild partial colon obstruction  CT findings likely due to large stool burden. Dr. Bosie Clos, GI, input appreciated. Resolved with enemas. Advance diet as tolerated. Continue miralax daily.   Chronic constipation  Management as indicated above, start stool softners once partial obstruction is improved.   DM 2  Hold metformin, resume at discharge. SSI. Stable.   Hypertension  Stable. Continue triamterene-HCTZ.   Hypokalemia  Replace as needed. Magnesium normal.   Hyponatremia  Continue to monitor.   H/o of breast cancer  Continue femara.   H/o colonic polyps  Management as indicated above.   Generalized weakness  Fall in the ER due to pain medications. No PT needs per PT.   Consultants:  GI, Dr. Bosie Clos  Procedures:  As above.  Antibiotics:   None.  Discharge Exam: Filed Vitals:   02/01/13 2013 02/02/13 0402 02/02/13 0840 02/02/13 1000  BP: 103/59 101/51  141/68  Pulse: 64 53  63  Temp: 99 F (37.2 C) 98.7 F (37.1 C)  98 F (36.7 C)  TempSrc: Oral Oral  Oral  Resp: 18 18  18   Height:      Weight: 83.9 kg (184 lb 15.5 oz)     SpO2: 97% 97% 97% 97%   Discharge Instructions  Discharge Orders   Future Appointments Provider Department Dept Phone   04/20/2013 10:30 AM Waymon Budge, MD [facility] [phone]   Future Orders Complete By Expires     Diet Carb Modified  As directed     Discharge instructions  As directed     Comments:      Followup with [name], MD (PCP) in 1 week.    Increase activity slowly  As directed         Medication List    TAKE these medications       albuterol 0.63 MG/3ML nebulizer solution  Commonly known as:  ACCUNEB  Take 1 ampule by nebulization 4 (four) times daily as needed.     albuterol 108 (90 BASE) MCG/ACT inhaler  Commonly known as:  PROVENTIL HFA;VENTOLIN HFA  Inhale 2 puffs into the lungs every 4 (four) hours as needed for wheezing or shortness of breath.     aspirin 81 MG tablet  Take 81 mg by mouth daily.     budesonide-formoterol 160-4.5 MCG/ACT inhaler  Commonly known as:  SYMBICORT  Inhale 2 puffs into the lungs 2 (two) times daily. Rinse mouth well. Maintenance inhaler.     diphenhydrAMINE 25 MG tablet  Commonly known as:  BENADRYL  Take 50 mg by mouth daily as needed. For swelling     EPINEPHrine 0.3 mg/0.3 mL Devi  Commonly known as:  EPI-PEN  Inject 0.3 mLs (0.3 mg total) into the muscle once.     GAS-X PO  Take 2 tablets by mouth once.     letrozole 2.5 MG tablet  Commonly known as:  FEMARA  Take 1 tablet (2.5 mg total) by mouth daily.     magnesium hydroxide 800 MG/5ML suspension  Commonly known as:  MILK OF MAGNESIA  Take 5 mLs by mouth every evening.     metFORMIN 1000 MG tablet  Commonly known as:  GLUCOPHAGE  TAKE 1  TABLET EVERY DAY     montelukast 10 MG tablet  Commonly known as:  SINGULAIR  Take 1 tablet (10 mg total) by mouth at bedtime.     multivitamin-iron-minerals-folic acid chewable tablet  Chew 1 tablet by mouth daily.     polyethylene glycol packet  Commonly known as:  MIRALAX / GLYCOLAX  Take 17 g by mouth daily.     potassium chloride SA 20 MEQ tablet  Commonly known as:  K-DUR,KLOR-CON  Take 1 tablet (20 mEq total) by mouth daily.     simvastatin 40 MG tablet  Commonly known as:  ZOCOR  Take 40 mg by mouth every evening.     temazepam 15 MG capsule  Commonly known as:  RESTORIL  Take 1 capsule (15 mg total) by mouth at bedtime as needed for sleep (1 or 2 as needed for sleep).     triamterene-hydrochlorothiazide 37.5-25 MG per tablet  Commonly known as:  MAXZIDE-25  TAKE 1 TABLET BY MOUTH DAILY     zaleplon 5 MG capsule  Commonly known as:  SONATA  1 or 2 for sleep if needed           Follow-up Information   Follow up with [name], MD. Schedule an appointment as soon as possible for a visit in 1 week.   Contact information:   [address] [place] [address] [phone]       Follow up with Shirley Friar., MD. Schedule an appointment as soon as possible for a visit in 3 weeks.   Contact information:   [address], [address] AND [address] [place] [address] [phone]        The results of significant diagnostics from this hospitalization (including imaging, microbiology, ancillary and laboratory) are listed below for reference.    Significant Diagnostic Studies: Ct Abdomen Pelvis W Contrast  01/30/2013  *RADIOLOGY REPORT*  Clinical Data: Abdominal distention.  Constipation.  History of breast cancer and previous appendectomy.  CT ABDOMEN AND PELVIS WITH CONTRAST  Technique:  Multidetector CT imaging of the abdomen and pelvis was performed following the standard protocol during bolus  administration of intravenous contrast.  Contrast: 50mL OMNIPAQUE IOHEXOL 300 MG/ML  SOLN, OMNIPAQUE IOHEXOL 300 MG/ML  SOLN  Comparison: Radiographs obtained earlier today and previous CT dated 04/12/2010.  Findings: Again demonstrated is a calculus in the lower pole of the right kidney without hydronephrosis.  This measures 5 mm in maximum diameter.  Normal appearing left kidney, ureters and urinary bladder with no additional calculi seen.  Normal appearing liver, spleen, pancreas, gallbladder adrenal  glands, uterus and ovaries.  Diffuse distention of the colon containing gas and feces with air/feces levels.  There is some wall thickening involving a short segment of the sigmoid colon in the right pelvis with a maximum wall thickness of 10.4 mm on image number 54.  This was not previously present.  This portion of the colon is poorly distended with the remainder of the colon normally distended or mildly dilated.  No enlarged lymph nodes.  Minimal atelectasis or scarring at both lung bases.  Lumbar and lower thoracic spine degenerative changes.  IMPRESSION:  1.  Possible distal sigmoid colon mass causing mild partial colon obstruction.  Alternatively, this could represent a pseudo mass due to under distention of this portion of the colon, with mild ileus involving the remainder of the colon.  Correlation with sigmoidoscopy/colonoscopy is recommended. 2.  Stable nonobstructing lower pole right renal calculus.   Original Report Authenticated By: Beckie Salts, M.D.     Microbiology: Recent Results (from the past 240 hour(s))  URINE CULTURE     Status: None   Collection Time    01/30/13  5:02 PM      Result Value Range Status   Colony Count 50,000 COLONIES/ML   Final   Organism ID, Bacteria Multiple bacterial morphotypes present, none   Final   Organism ID, Bacteria predominant. Suggest appropriate recollection if    Final   Organism ID, Bacteria clinically indicated.   Final     Labs: Basic  Metabolic Panel:  Recent Labs Lab 01/30/13 1840 02/01/13 0525 02/02/13 0440  NA 134* 129* 134*  K 3.6 3.2* 3.2*  CL 94* 91* 94*  CO2 27 28 27   GLUCOSE 101* 134* 102*  BUN 20 8 7   CREATININE 1.10 0.76 0.71  CALCIUM 10.3 9.9 10.2  MG  --   --  2.2   Liver Function Tests:  Recent Labs Lab 01/30/13 1840  AST 25  ALT 13  ALKPHOS 96  BILITOT 0.4  PROT 7.8  ALBUMIN 4.1    Recent Labs Lab 01/30/13 1840  LIPASE 47   No results found for this basename: AMMONIA,  in the last 168 hours CBC:  Recent Labs Lab 01/30/13 1840 02/01/13 0525  WBC 10.2 7.1  NEUTROABS 7.2  --   HGB 13.3 12.8  HCT 38.6 36.7  MCV 89.8 87.6  PLT 346 293   Cardiac Enzymes: No results found for this basename: CKTOTAL, CKMB, CKMBINDEX, TROPONINI,  in the last 168 hours BNP: BNP (last 3 results) No results found for this basename: PROBNP,  in the last 8760 hours CBG:  Recent Labs Lab 02/01/13 2018 02/02/13 0013 02/02/13 0406 02/02/13 0753 02/02/13 1129  GLUCAP 99 102* 95 101* 81    Signed:  Carnell Casamento A  [facility] 02/02/2013, 1:55 PM

## 2013-02-02 NOTE — Progress Notes (Signed)
[facility] PROGRESS NOTE  [name]:[id number] DOB: [birth year] DOA: 01/30/2013 PCP: [name], MD  Assessment/Plan: Fecal impaction with large amount of stool in proximal and rectum and distal sigmoid possible mild partial colon obstruction CT findings likely due to large stool burden. Dr. Bosie Clos, GI, input appreciated. Continue enemas. Advance diet as tolerated. Start miralax daily.  Chronic constipation Management as indicated above, start stool softners once partial obstruction is improved.  DM 2 Hold metformin, resume at discharge. SSI. Stable.  Hypertension Stable. Continue triamterene-HCTZ.  Hypokalemia Replace as needed. Magnesium normal.  Hyponatremia Continue to monitor.  H/o of breast cancer Continue femara.  H/o colonic polyps Management as indicated above.  Generalized weakness Fall in the ER due to pain medications. No PT needs per PT.  Code Status: Full code. Family Communication: Patient updated at bedside.  Disposition Plan: Pending.   Consultants:  GI, Dr. Bosie Clos  Procedures:  As above.  Antibiotics:  None.  HPI/Subjective: Abdominal pain resolved. Left shoulder pain resolved.  Objective: Filed Vitals:   02/01/13 1400 02/01/13 1747 02/01/13 2013 02/02/13 0402  BP: 112/65 122/80 103/59 101/51  Pulse: 56 79 64 53  Temp: 98 F (36.7 C) 98.2 F (36.8 C) 99 F (37.2 C) 98.7 F (37.1 C)  TempSrc: Oral Oral Oral Oral  Resp: 18 18 18 18   Height:      Weight:   83.9 kg (184 lb 15.5 oz)   SpO2: 96% 99% 97% 97%    Intake/Output Summary (Last 24 hours) at 02/02/13 0740 Last data filed at 02/02/13 0300  Gross per 24 hour  Intake    960 ml  Output      0 ml  Net    960 ml   Filed Weights   01/31/13 0304 01/31/13 2014 02/01/13 2013  Weight: 83.3 kg (183 lb 10.3 oz) 83.507 kg (184 lb 1.6 oz) 83.9 kg (184 lb 15.5 oz)    Exam: Physical Exam: General: Awake, Oriented, No acute distress. HEENT: EOMI. Neck:  Supple CV: S1 and S2 Lungs: Clear to ascultation bilaterally Abdomen: Soft, Nontender, slightly distended, + bowel sounds, high pitched sounds improved. Ext: Good pulses. Trace edema.  Data Reviewed: Basic Metabolic Panel:  Recent Labs Lab 01/30/13 1840 02/01/13 0525 02/02/13 0440  NA 134* 129* 134*  K 3.6 3.2* 3.2*  CL 94* 91* 94*  CO2 27 28 27   GLUCOSE 101* 134* 102*  BUN 20 8 7   CREATININE 1.10 0.76 0.71  CALCIUM 10.3 9.9 10.2  MG  --   --  2.2   Liver Function Tests:  Recent Labs Lab 01/30/13 1840  AST 25  ALT 13  ALKPHOS 96  BILITOT 0.4  PROT 7.8  ALBUMIN 4.1    Recent Labs Lab 01/30/13 1840  LIPASE 47   No results found for this basename: AMMONIA,  in the last 168 hours CBC:  Recent Labs Lab 01/30/13 1840 02/01/13 0525  WBC 10.2 7.1  NEUTROABS 7.2  --   HGB 13.3 12.8  HCT 38.6 36.7  MCV 89.8 87.6  PLT 346 293   Cardiac Enzymes: No results found for this basename: CKTOTAL, CKMB, CKMBINDEX, TROPONINI,  in the last 168 hours BNP (last 3 results) No results found for this basename: PROBNP,  in the last 8760 hours CBG:  Recent Labs Lab 02/01/13 1131 02/01/13 1750 02/01/13 2018 02/02/13 0013 02/02/13 0406  GLUCAP 122* 120* 99 102* 95    Recent Results (from the past 240 hour(s))  URINE CULTURE  Status: None   Collection Time    01/30/13  5:02 PM      Result Value Range Status   Colony Count 50,000 COLONIES/ML   Final   Organism ID, Bacteria Multiple bacterial morphotypes present, none   Final   Organism ID, Bacteria predominant. Suggest appropriate recollection if    Final   Organism ID, Bacteria clinically indicated.   Final     Studies: No results found.  Scheduled Meds: . aspirin EC  81 mg Oral Daily  . budesonide-formoterol  2 puff Inhalation BID  . heparin  5,000 Units Subcutaneous Q8H  . hydrocortisone-pramoxine  1 applicator Rectal BID  . insulin aspart  0-15 Units Subcutaneous Q4H  . letrozole  2.5 mg Oral Daily   . montelukast  10 mg Oral QHS  . multivitamin with minerals  1 tablet Oral Daily  . polyethylene glycol  17 g Oral Daily  . potassium chloride  40 mEq Oral Q6H  . sodium phosphate  1 enema Rectal BID  . triamterene-hydrochlorothiazide  1 each Oral Daily   Continuous Infusions: . sodium chloride 500 mL (01/31/13 1235)    Principal Problem:   Colonic mass Active Problems:   DIABETES MELLITUS, TYPE II   COLONIC POLYPS, HX OF    Joanne Tsuchiya A, MD  [facility] Pager [phone]. If 7PM-7AM, please contact night-coverage at [email or web address], password [facility] 02/02/2013, 7:40 AM  LOS: 3 days

## 2013-02-05 ENCOUNTER — Telehealth: Payer: Self-pay | Admitting: Internal Medicine

## 2013-02-05 MED ORDER — SIMVASTATIN 40 MG PO TABS: 40.0000 mg | ORAL_TABLET | Freq: Every evening | ORAL | Status: DC

## 2013-02-05 NOTE — Telephone Encounter (Signed)
Refill done.  

## 2013-02-05 NOTE — Telephone Encounter (Signed)
refill Simvastatin (Tab) 40 MG Take 40 mg by mouth every evening. #30 last fill 1.9.14

## 2013-02-09 ENCOUNTER — Ambulatory Visit (INDEPENDENT_AMBULATORY_CARE_PROVIDER_SITE_OTHER): Payer: Medicare PPO | Admitting: Internal Medicine

## 2013-02-09 VITALS — BP 118/76 | HR 69 | Wt 183.0 lb

## 2013-02-09 DIAGNOSIS — K5909 Other constipation: Secondary | ICD-10-CM

## 2013-02-09 DIAGNOSIS — F5104 Psychophysiologic insomnia: Secondary | ICD-10-CM

## 2013-02-09 DIAGNOSIS — G4700 Insomnia, unspecified: Secondary | ICD-10-CM

## 2013-02-09 LAB — TSH: TSH: 0.62 u[IU]/mL (ref 0.35–5.50)

## 2013-02-09 LAB — POTASSIUM: Potassium: 3.7 mEq/L (ref 3.5–5.1)

## 2013-02-09 MED ORDER — LOSARTAN POTASSIUM 100 MG PO TABS: 100.0000 mg | ORAL_TABLET | Freq: Every day | ORAL | Status: DC

## 2013-02-09 MED ORDER — LACTULOSE 10 GM/15ML PO SOLN: 10.0000 g | Freq: Three times a day (TID) | ORAL | Status: DC | PRN

## 2013-02-09 MED ORDER — SIMVASTATIN 40 MG PO TABS: 40.0000 mg | ORAL_TABLET | Freq: Every evening | ORAL | Status: DC

## 2013-02-09 NOTE — Telephone Encounter (Signed)
Refill done @ [facility].

## 2013-02-09 NOTE — Patient Instructions (Addendum)
MiraLax daily Add Metamucil, 2 capsules twice a day with fluids Try milk of magnesia or lactulose, see which one works better for you. Please see your stomach doctor as soon as possible Next visit in 3 months.

## 2013-02-09 NOTE — Progress Notes (Signed)
  Subjective:    Patient ID: [name], female    DOB: Nov 30, 1941, 72 y.o.   MRN: [id number]  HPI Hospital followup Admitted 01/30/2013 for 3 days. Admitted with question of SBO, CT showed:  1. Possible distal sigmoid colon mass causing mild partial colon  obstruction. Alternatively, this could represent a pseudo mass due  to under distention of this portion of the colon, with mild ileus  involving the remainder of the colon. Correlation with  sigmoidoscopy/colonoscopy is recommended.  2. Stable nonobstructing lower pole right renal calculus.   Labs show a normal CBC, A1c 6.3, potassium before discharge was slightly low.  Last TSH was more than a year ago. GI was consulted, they perform flex sigmoidoscopy, she was diagnosed with stool impaction, recommended aggressive treatment.  Past Medical History:   HTN dx 11-2011   Diabetes mellitus, type II   Peripheral neuropathy   Hyperlipidemia   Obesity   OSA CPAP intolerant- NPSG 08/09/03- AHI 66/hr   Asthma, Dx in adulthood (72y/o)   - PFT 09/08/09 FEV1 1.70/81%; FEV1/FVC 0.75; + resp to dilator; Nl LV/ DLCO   breast cancer, hx of, BILATERAL diagnosed in 2006, recurred 2011- Dr Donnie Coffin   Drue Flirt, XRT, Femara   Colonic polyps, hx of   Osteopenia   Ovarian Mass, resolved   Allergic rhinitis   negative stress test in 2004    Past Surgical History:   Appendectomy   MASTECTOMY, BILATERAL, HX OF   ?pilonidal cyst surgery   Review of Systems Since she left the hospital a week ago, she is doing okay. She took MiraLax daily for 3 days, did not have a bowel movement so she decided to quit miralax and is taking milk of magnesia daily with good results. No fever chills No nausea, vomiting, diarrhea. No blood in the stools. Still has a pain in the left lower quadrant, worse since she had a fall a few weeks ago. Worse in the morning when she gets out of bed. Was prescribed potassium supplements at the hospital but has not take.      Objective:   Physical Exam General -- alert, well-developed Lungs -- normal respiratory effort, no intercostal retractions, no accessory muscle use, and normal breath sounds.   Heart-- normal rate, regular rhythm, no murmur, and no gallop.   Abdomen-- not tender to palpation today, increased bowel sounds, bowel sounds are increased but no high pitch (had MOM this morning), no mass, no rebound.  Extremities-- no pretibial edema bilaterally  Neurologic-- alert & oriented X3 and strength normal in all extremities. Psych-- Cognition and judgment appear intact. Alert and cooperative with normal attention span and concentration.  not anxious appearing and not depressed appearing.       Assessment & Plan:

## 2013-02-09 NOTE — Assessment & Plan Note (Addendum)
Severe and chronic constipation, status post admission for impaction, second admission for same problem. Plan: MiraLax every day Metamucil 2 capsules twice a day with abdomen fluids Milk of magnesia as needed also will prescribe lactulose, patient to see  which one works better See GI as recommended Check a TSH Potassium was slightly low at the hospital, never to supplements, check potassium. Still having some pain in the left lower quadrann, recommend observation for now.

## 2013-02-09 NOTE — Assessment & Plan Note (Signed)
Unclear if he she needs to take both temazepam and sonata, will let Dr. Huel Cote about it.

## 2013-02-10 ENCOUNTER — Encounter: Payer: Self-pay | Admitting: Internal Medicine

## 2013-02-11 ENCOUNTER — Encounter: Payer: Self-pay | Admitting: *Deleted

## 2013-03-03 ENCOUNTER — Other Ambulatory Visit: Payer: Self-pay | Admitting: Internal Medicine

## 2013-03-03 NOTE — Telephone Encounter (Signed)
Refill done.  

## 2013-04-15 ENCOUNTER — Other Ambulatory Visit: Payer: Self-pay | Admitting: Internal Medicine

## 2013-04-16 NOTE — Telephone Encounter (Signed)
Refill done.  

## 2013-04-20 ENCOUNTER — Ambulatory Visit (INDEPENDENT_AMBULATORY_CARE_PROVIDER_SITE_OTHER): Payer: Medicare PPO | Admitting: Internal Medicine

## 2013-04-20 ENCOUNTER — Encounter: Payer: Self-pay | Admitting: Internal Medicine

## 2013-04-20 VITALS — BP 110/66 | HR 73 | Ht 63.0 in | Wt 186.2 lb

## 2013-04-20 DIAGNOSIS — J309 Allergic rhinitis, unspecified: Secondary | ICD-10-CM

## 2013-04-20 DIAGNOSIS — J45998 Other asthma: Secondary | ICD-10-CM

## 2013-04-20 DIAGNOSIS — J45909 Unspecified asthma, uncomplicated: Secondary | ICD-10-CM

## 2013-04-20 NOTE — Assessment & Plan Note (Signed)
Mild seasonal exacerbation. Plan- try sample Dymista.

## 2013-04-20 NOTE — Assessment & Plan Note (Signed)
Not wheezing. She has had some upper airway cough from drip. Dymista may help.

## 2013-04-20 NOTE — Progress Notes (Signed)
Patient ID: [name], female    DOB: December 08, 1941, 72 y.o.   MRN: [id number]  HPI 07/16/11- 54 yoF former smoker followed for OSA/ failed CPAP, asthma, allergic rhinitis complicated by DM, hx breast cancer Blames heat for need to resume using her rescue inhaler..  She associates purple areas on lips from using her nebulizer machine- ? Hard enough to bruise? She couldn't tolerate and stopped using CPAP. We reviewed symptoms and warning signs again.  She has had spots on hands, pruritic, " inconclusive " on biopsy by dermatologist, Dr Nevada Crane. Admits night sweats, cough, scant yellow phlegm.  Last CXR 12/27/10 f/u for recurrent breast cancer- left.    10/16/11- 25 yoF former smoker followed for OSA/ failed CPAP, asthma, allergic rhinitis complicated by DM, hx breast cancer She reports an urticarial reaction to peanuts 2 months ago and is unsure if she has had a reaction in the past 2 chocolate. She would like to look at food allergy testing so she knows better to talk to watch out for. We discussed food allergy versus intolerance.  She is taking prednisone and valacyclovir for a hyperpigmented rash on her hands, after bx by dermatologist. Possibly viral warts. Allergy symptoms do better in cold weather but will also improve on her steroids. Cough has been drier with scant clear sputum. She has not needed her nebulizer machine at all.  Chest x-ray: 07/16/2011-NAD, mild CE, bronchitis changes, arthritis in spine.   04/15/12- 70 yoF former smoker followed for OSA/ failed CPAP, asthma, allergic rhinitis, Food Allergy complicated by DM, hx breast cancer Had 2 chocolate chip cookies yesterday-started itching and swelling; did not go to ER; still itching today. On May 6 she 82 chocolate chip cookies, not recognized contained nuts. 4-5 hours later she developed facial swelling/angioedema with some itching but no wheeze. She took Benadryl. She continues to have some swelling in the left side of her  face now 24 hours later. She never had any increased wheeze throat swelling or other airway discomfort. She has noticed increased wheeze and dry cough over the last 2 months, responsive to her rescue inhaler. Additional problem of rash on her arms which begins like pimples but develops a hyperpigmented area about 3 cm diameter which response to steroid cream. Dermatologist did biopsy. Results nonspecific. No relation noted between this rash and her other health problems, exposures or other triggers.  06/19/12- 70 yoF former smoker followed for OSA/ failed CPAP, asthma, allergic rhinitis, Food Allergy complicated by DM, hx breast cancer Had to recently go to ER for throat swelling-unsure of cause. Not sleeping well at times and doesnt use CPAP recently-has had more stressors going on to cause this. We reviewed her ER note. Over several months she's had episodes of itching hands then swelling of one side or the other of her tongue that July 7 she had angioedema in her throat. There is no family history of angioedema and she has not been on ACE inhibitors. She avoids known foods that cause problems in the past. 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 None clearly in significant range. She did not take Benadryl as recommended that the emergency room because of listed side effect of sleepiness. We discussed alternatives. She has an EpiPen if needed.  08/22/12-  70 yoF former smoker followed for OSA/ failed CPAP, asthma, allergic rhinitis, Food Allergy complicated by DM, hx breast cancer Cough-clear in color; using rescue inhaler more than usual; Increased cough lying in  bed and aware of reflux. Insomnia wakes her around 2 AM but she does not think that is from coughing. She has been irregular using Symbicort, trying to use it as a rescue inhaler. We discussed that. Some increase in exertional dyspnea.  12/23/12- 74 yoF former smoker followed for OSA/ failed  CPAP, asthma, allergic rhinitis, Food Allergy, GERD, complicated by DM, hx breast cancer/ XRT FOLLOWS FOR: whole month of December has had congestion-cough-clear in color;some of its related to reflux and some due to the weather per patient. Had a cold. Chest congestion with clear mucus. Using rescue inhaler twice daily now but has not needed nebulizer. Continues Symbicort. Nothing purulent and no fever or blood. Sonata not enough to keep her asleep. Failed NyQuil. Afraid of sleepwalking with Ambien. Snoring status unclear  04/20/13- 71 yoF former smoker followed for OSA/ failed CPAP, asthma, allergic rhinitis, Food Allergy, GERD, complicated by DM, hx breast cancer/ XRT FOLLOWS FOR: increased allergies due to pollen increased; has also notcied certain smells or things that she may have eaten cause slight problems; usually can use rescue inhaler and this calms things down.  Also,pt states that her sleep patterns are not doing well-some night no sleep at all. Blames cough on pollen/ sinus drip. Dry cough- inhaler helps. May reflux a little at times, but no dysphagia. Occasional insomnia- busy brain. 2 x zaleplon sufficient.. CXR 12/30/12 IMPRESSION:  No active cardiopulmonary disease.  Original Report Authenticated By: Charlett Nose, M.D.   Review of Systems-See HPI Constitutional:   No-   weight loss, night sweats, fevers, chills, fatigue, lassitude. HEENT:   No-  headaches, difficulty swallowing, tooth/dental problems, sore throat,       No-  sneezing, +itching, ear ache, nasal congestion, +post nasal drip,  CV:  No-   chest pain, orthopnea, PND, swelling in lower extremities, anasarca, dizziness, palpitations Resp: + shortness of breath with exertion or at rest.              +  productive cough,  + non-productive cough,  No- coughing up of blood.              No-   change in color of mucus.  No- wheezing.   Skin: No-   rash or lesions. GI:  +   heartburn, indigestion, no-abdominal pain, nausea,  vomiting, GU:  MS:  No-   joint pain or swelling. . Neuro-     nothing unusual Psych:  No- change in mood or affect. No depression or anxiety.  No memory loss.  Objective:   Physical Exam General- Alert, Oriented, Affect-appropriate, Distress- none acute    Overweight.  Skin- clear Lymphadenopathy- none Head- atraumatic            Eyes- Gross vision intact, PERRLA, conjunctivae clear secretions            Ears- Hearing, canals normal            Nose- Clear, no-Septal dev, mucus, polyps, erosion, perforation             Throat- Mallampati III , mucosa clear , drainage- none, tonsils- atrophic Neck- flexible , trachea midline, no stridor , thyroid nl, carotid no bruit Chest - symmetrical excursion , unlabored           Heart/CV- RRR , no murmur , no gallop  , no rub, nl s1 s2                           -  JVD- none , edema- none, stasis changes- none, varices- none           Lung-  No wheeze, No cough, unlabored,  dullness-none, rub- none           Chest wall-  Abd- +active borborygmi Br/ Gen/ Rectal- Not done, not indicated Extrem- cyanosis- none, clubbing, none, atrophy- none, strength- nl Neuro- grossly intact to observation

## 2013-04-20 NOTE — Patient Instructions (Addendum)
Sample Dymista nasal spray   1-2 puffs each nostril once daily at bedtime    Try using this for allergic postnasal drip  Please call as needed

## 2013-04-24 ENCOUNTER — Other Ambulatory Visit: Payer: Self-pay | Admitting: Internal Medicine

## 2013-04-27 NOTE — Telephone Encounter (Signed)
Refill done.  

## 2013-05-01 ENCOUNTER — Ambulatory Visit (INDEPENDENT_AMBULATORY_CARE_PROVIDER_SITE_OTHER): Payer: Medicare PPO | Admitting: General Surgery

## 2013-05-01 ENCOUNTER — Encounter (INDEPENDENT_AMBULATORY_CARE_PROVIDER_SITE_OTHER): Payer: Self-pay | Admitting: General Surgery

## 2013-05-01 VITALS — BP 118/78 | HR 88 | Temp 97.8°F | Ht 63.0 in | Wt 185.6 lb

## 2013-05-01 DIAGNOSIS — Z853 Personal history of malignant neoplasm of breast: Secondary | ICD-10-CM

## 2013-05-01 NOTE — Progress Notes (Signed)
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 She has a history of an axillary skin recurrence in 2011 status post excision and radiation and now on adjuvant letrazole. She reports no problems in terms of her chest wall or skin or arm swelling. She reports she has had a little bit of pelvic pressure occasionally reminds her of feeling pre-menstrual. No bleeding or discharge. She has been going for routine gynecologic exam was unremarkable. She has a few hot flashes.  Exam: BP 118/78  Pulse 88  Temp(Src) 97.8 F (36.6 C) (Temporal)  Ht 5\' 3"  (1.6 m)  Wt 185 lb 9.6 oz (84.188 kg)  BMI 32.89 kg/m2  SpO2 97% General: Overweight but otherwise healthy-appearing Afro-American female Skin: Warm and dry without rash infection Lymph nodes: No cervical, supraclavicular or axillary nodes palpable Lungs: Clear equal breath sounds bilaterally Chest wall: Status post bilateral mastectomy. Mild postradiation changes on the left. There is no skin thickening or mass or other evidence of recurrence.  Assessment and plan: History bilateral mastectomy for DCIS and invasive cancer with history of skin recurrence on the left and radiation. On hormonal treatment. No clinical evidence of recurrence. Return in 6 months.

## 2013-06-01 ENCOUNTER — Other Ambulatory Visit: Payer: Self-pay | Admitting: Family

## 2013-07-07 ENCOUNTER — Telehealth: Payer: Self-pay | Admitting: *Deleted

## 2013-07-07 NOTE — Telephone Encounter (Signed)
Left message for a return phone call to schedule with a new provider.  Awaiting patient response. 

## 2013-07-09 ENCOUNTER — Telehealth: Payer: Self-pay | Admitting: *Deleted

## 2013-07-09 NOTE — Telephone Encounter (Signed)
Received call back from patient and confirmed appt. For 09/18/13 at 0900 with Dr. Welton Flakes per patient request.

## 2013-07-10 ENCOUNTER — Encounter: Payer: Self-pay | Admitting: *Deleted

## 2013-07-10 NOTE — Progress Notes (Signed)
Mailed letter and calendar with new apt. 

## 2013-08-21 ENCOUNTER — Encounter: Payer: Self-pay | Admitting: Internal Medicine

## 2013-08-21 ENCOUNTER — Ambulatory Visit (INDEPENDENT_AMBULATORY_CARE_PROVIDER_SITE_OTHER): Payer: Medicare PPO | Admitting: Internal Medicine

## 2013-08-21 VITALS — BP 112/70 | HR 87 | Ht 63.0 in | Wt 192.6 lb

## 2013-08-21 DIAGNOSIS — Z23 Encounter for immunization: Secondary | ICD-10-CM

## 2013-08-21 DIAGNOSIS — J302 Other seasonal allergic rhinitis: Secondary | ICD-10-CM

## 2013-08-21 DIAGNOSIS — J209 Acute bronchitis, unspecified: Secondary | ICD-10-CM

## 2013-08-21 DIAGNOSIS — J309 Allergic rhinitis, unspecified: Secondary | ICD-10-CM

## 2013-08-21 DIAGNOSIS — G4700 Insomnia, unspecified: Secondary | ICD-10-CM

## 2013-08-21 MED ORDER — LEVALBUTEROL HCL 0.63 MG/3ML IN NEBU
0.6300 mg | INHALATION_SOLUTION | Freq: Once | RESPIRATORY_TRACT | Status: AC
  Administered 2013-08-21: 0.63 mg via RESPIRATORY_TRACT

## 2013-08-21 MED ORDER — LEVALBUTEROL HCL 0.63 MG/3ML IN NEBU: 1.0000 | INHALATION_SOLUTION | RESPIRATORY_TRACT | Status: DC | PRN

## 2013-08-21 MED ORDER — METHYLPREDNISOLONE ACETATE 80 MG/ML IJ SUSP
80.0000 mg | Freq: Once | INTRAMUSCULAR | Status: AC
  Administered 2013-08-21: 80 mg via INTRAMUSCULAR

## 2013-08-21 NOTE — Progress Notes (Signed)
Patient ID: [name], female    DOB: December 08, 1941, 72 y.o.   MRN: [id number]  HPI 07/16/11- 54 yoF former smoker followed for OSA/ failed CPAP, asthma, allergic rhinitis complicated by DM, hx breast cancer Blames heat for need to resume using her rescue inhaler..  She associates purple areas on lips from using her nebulizer machine- ? Hard enough to bruise? She couldn't tolerate and stopped using CPAP. We reviewed symptoms and warning signs again.  She has had spots on hands, pruritic, " inconclusive " on biopsy by dermatologist, Dr Nevada Crane. Admits night sweats, cough, scant yellow phlegm.  Last CXR 12/27/10 f/u for recurrent breast cancer- left.    10/16/11- 25 yoF former smoker followed for OSA/ failed CPAP, asthma, allergic rhinitis complicated by DM, hx breast cancer She reports an urticarial reaction to peanuts 2 months ago and is unsure if she has had a reaction in the past 2 chocolate. She would like to look at food allergy testing so she knows better to talk to watch out for. We discussed food allergy versus intolerance.  She is taking prednisone and valacyclovir for a hyperpigmented rash on her hands, after bx by dermatologist. Possibly viral warts. Allergy symptoms do better in cold weather but will also improve on her steroids. Cough has been drier with scant clear sputum. She has not needed her nebulizer machine at all.  Chest x-ray: 07/16/2011-NAD, mild CE, bronchitis changes, arthritis in spine.   04/15/12- 70 yoF former smoker followed for OSA/ failed CPAP, asthma, allergic rhinitis, Food Allergy complicated by DM, hx breast cancer Had 2 chocolate chip cookies yesterday-started itching and swelling; did not go to ER; still itching today. On May 6 she 82 chocolate chip cookies, not recognized contained nuts. 4-5 hours later she developed facial swelling/angioedema with some itching but no wheeze. She took Benadryl. She continues to have some swelling in the left side of her  face now 24 hours later. She never had any increased wheeze throat swelling or other airway discomfort. She has noticed increased wheeze and dry cough over the last 2 months, responsive to her rescue inhaler. Additional problem of rash on her arms which begins like pimples but develops a hyperpigmented area about 3 cm diameter which response to steroid cream. Dermatologist did biopsy. Results nonspecific. No relation noted between this rash and her other health problems, exposures or other triggers.  06/19/12- 70 yoF former smoker followed for OSA/ failed CPAP, asthma, allergic rhinitis, Food Allergy complicated by DM, hx breast cancer Had to recently go to ER for throat swelling-unsure of cause. Not sleeping well at times and doesnt use CPAP recently-has had more stressors going on to cause this. We reviewed her ER note. Over several months she's had episodes of itching hands then swelling of one side or the other of her tongue that July 7 she had angioedema in her throat. There is no family history of angioedema and she has not been on ACE inhibitors. She avoids known foods that cause problems in the past. 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 None clearly in significant range. She did not take Benadryl as recommended that the emergency room because of listed side effect of sleepiness. We discussed alternatives. She has an EpiPen if needed.  08/22/12-  70 yoF former smoker followed for OSA/ failed CPAP, asthma, allergic rhinitis, Food Allergy complicated by DM, hx breast cancer Cough-clear in color; using rescue inhaler more than usual; Increased cough lying in  bed and aware of reflux. Insomnia wakes her around 2 AM but she does not think that is from coughing. She has been irregular using Symbicort, trying to use it as a rescue inhaler. We discussed that. Some increase in exertional dyspnea.  12/23/12- 89 yoF former smoker followed for OSA/ failed  CPAP, asthma, allergic rhinitis, Food Allergy, GERD, complicated by DM, hx breast cancer/ XRT FOLLOWS FOR: whole month of December has had congestion-cough-clear in color;some of its related to reflux and some due to the weather per patient. Had a cold. Chest congestion with clear mucus. Using rescue inhaler twice daily now but has not needed nebulizer. Continues Symbicort. Nothing purulent and no fever or blood. Sonata not enough to keep her asleep. Failed NyQuil. Afraid of sleepwalking with Ambien. Snoring status unclear  04/20/13- 71 yoF former smoker followed for OSA/ failed CPAP, asthma, allergic rhinitis, Food Allergy, GERD, complicated by DM, hx breast cancer/ XRT FOLLOWS FOR: increased allergies due to pollen increased; has also notcied certain smells or things that she may have eaten cause slight problems; usually can use rescue inhaler and this calms things down.  Also,pt states that her sleep patterns are not doing well-some night no sleep at all. Blames cough on pollen/ sinus drip. Dry cough- inhaler helps. May reflux a little at times, but no dysphagia. Occasional insomnia- busy brain. 2 x zaleplon sufficient.. CXR 12/30/12 IMPRESSION:  No active cardiopulmonary disease.  Original Report Authenticated By: Charlett Nose, M.D.   08/21/13-71 yoF former smoker followed for OSA/ failed CPAP, asthma, allergic rhinitis, Food Allergy, GERD, complicated by DM, hx breast cancer/ XRT FOLLOWS FOR:  Symptoms worsening x3 months, as of yesterday throat irritated and sore Exercising less, which she blames for increased dyspnea with exertion. Using metered inhaler more, blames weather. Lis ear ache. Insomnia-still wakes occasionally but stopped using temazepam or Sonata as unhelpful.  Review of Systems-See HPI Constitutional:   No-   weight loss, night sweats, fevers, chills, fatigue, lassitude. HEENT:   No-  headaches, difficulty swallowing, tooth/dental problems, sore throat,       No-  sneezing,  itching, +ear ache, nasal congestion, +post nasal drip,  CV:  No-   chest pain, orthopnea, PND, swelling in lower extremities, anasarca, dizziness, palpitations Resp: + shortness of breath with exertion or at rest.              No-  productive cough,  + non-productive cough,  No- coughing up of blood.              No-   change in color of mucus.  + wheezing.   Skin: No-   rash or lesions. GI:  +   heartburn, indigestion, no-abdominal pain, nausea, vomiting, GU:  MS:  No-   joint pain or swelling. . Neuro-     nothing unusual Psych:  No- change in mood or affect. No depression or anxiety.  No memory loss.  Objective:   Physical Exam General- Alert, Oriented, Affect-appropriate, Distress- none acute    Overweight.  Skin- clear Lymphadenopathy- none Head- atraumatic            Eyes- Gross vision intact, PERRLA, conjunctivae clear secretions            Ears- Hearing, canals normal but has cotton L earis            Nose- Clear, no-Septal dev, mucus, polyps, erosion, perforation             Throat- Mallampati III ,  mucosa+red , drainage- none, tonsils- atrophic Neck- flexible , trachea midline, no stridor , thyroid nl, carotid no bruit Chest - symmetrical excursion , unlabored           Heart/CV- RRR , no murmur , no gallop  , no rub, nl s1 s2                           - JVD- none , edema- none, stasis changes- none, varices- none           Lung-  No wheeze, +dry cough, unlabored,  dullness-none, rub- none           Chest wall-  Abd-  Br/ Gen/ Rectal- Not done, not indicated Extrem- cyanosis- none, clubbing, none, atrophy- none, strength- nl Neuro- grossly intact to observation

## 2013-08-21 NOTE — Patient Instructions (Addendum)
Neb xop 0.63--     Let her keep the tubing  Depo 80  Flu vax  Please call as needed

## 2013-08-30 NOTE — Assessment & Plan Note (Signed)
Acute exacerbation, more likely allergies than viral infection. Plan-flu vaccine, tubing for home nebulizer machine, nebulizer Xopenex, Depo-Medrol

## 2013-08-30 NOTE — Assessment & Plan Note (Signed)
Plan-maintaining sleep hygiene. Continue to work on weight loss. Sleep off flat of back

## 2013-09-11 ENCOUNTER — Telehealth: Payer: Self-pay | Admitting: Oncology

## 2013-09-18 ENCOUNTER — Ambulatory Visit: Payer: Medicare PPO | Admitting: Adult Health

## 2013-09-25 ENCOUNTER — Encounter (INDEPENDENT_AMBULATORY_CARE_PROVIDER_SITE_OTHER): Payer: Self-pay | Admitting: General Surgery

## 2013-09-29 ENCOUNTER — Telehealth: Payer: Self-pay

## 2013-09-29 NOTE — Telephone Encounter (Addendum)
Medication and allergies: reviewed and updated  90 day supply/mail order: na Local pharmacy: [facility] [address]   Immunizations due:  Tdap (will let us know when she comes in)  A/P: breast cancer, hx of, BILATERAL dx 2006, recurred 2011- Dr Donnie Coffin  Flex sigmoidoscopy 01/2013; fecal impaction [facility] 04/2012--2 benign findings No FH, SH changes  To Discuss with Provider: Nothing at this time

## 2013-10-01 ENCOUNTER — Encounter: Payer: Self-pay | Admitting: Internal Medicine

## 2013-10-01 ENCOUNTER — Ambulatory Visit (INDEPENDENT_AMBULATORY_CARE_PROVIDER_SITE_OTHER): Payer: Medicare PPO | Admitting: Internal Medicine

## 2013-10-01 VITALS — BP 133/79 | HR 79 | Temp 99.1°F | Ht 63.0 in | Wt 188.0 lb

## 2013-10-01 DIAGNOSIS — I10 Essential (primary) hypertension: Secondary | ICD-10-CM

## 2013-10-01 DIAGNOSIS — Z0000 Encounter for general adult medical examination without abnormal findings: Secondary | ICD-10-CM

## 2013-10-01 DIAGNOSIS — E119 Type 2 diabetes mellitus without complications: Secondary | ICD-10-CM

## 2013-10-01 DIAGNOSIS — Z23 Encounter for immunization: Secondary | ICD-10-CM

## 2013-10-01 DIAGNOSIS — E785 Hyperlipidemia, unspecified: Secondary | ICD-10-CM

## 2013-10-01 DIAGNOSIS — M899 Disorder of bone, unspecified: Secondary | ICD-10-CM

## 2013-10-01 DIAGNOSIS — H9190 Unspecified hearing loss, unspecified ear: Secondary | ICD-10-CM

## 2013-10-01 LAB — CBC WITH DIFFERENTIAL/PLATELET
Basophils Relative: 0.4 % (ref 0.0–3.0)
Eosinophils Relative: 2.1 % (ref 0.0–5.0)
HCT: 42.6 % (ref 36.0–46.0)
Hemoglobin: 14.3 g/dL (ref 12.0–15.0)
Lymphs Abs: 1.5 10*3/uL (ref 0.7–4.0)
MCV: 90.1 fl (ref 78.0–100.0)
Monocytes Absolute: 0.6 10*3/uL (ref 0.1–1.0)
Monocytes Relative: 6.4 % (ref 3.0–12.0)
Platelets: 360 10*3/uL (ref 150.0–400.0)
RBC: 4.73 Mil/uL (ref 3.87–5.11)
WBC: 10.1 10*3/uL (ref 4.5–10.5)

## 2013-10-01 LAB — LIPID PANEL
HDL: 62.7 mg/dL (ref >39.00)
Total CHOL/HDL Ratio: 2

## 2013-10-01 LAB — COMPREHENSIVE METABOLIC PANEL
ALT: 17 U/L (ref 0–35)
AST: 23 U/L (ref 0–37)
Albumin: 4.5 g/dL (ref 3.5–5.2)
CO2: 30 mEq/L (ref 19–32)
Calcium: 9.9 mg/dL (ref 8.4–10.5)
Chloride: 96 mEq/L (ref 96–112)
Creatinine, Ser: 0.8 mg/dL (ref 0.4–1.2)
GFR: 88.36 mL/min (ref >60.00)
Potassium: 3.3 mEq/L — ABNORMAL LOW (ref 3.5–5.1)
Sodium: 137 mEq/L (ref 135–145)
Total Protein: 8 g/dL (ref 6.0–8.3)

## 2013-10-01 LAB — HEMOGLOBIN A1C: Hgb A1c MFr Bld: 6.7 % — ABNORMAL HIGH (ref 4.6–6.5)

## 2013-10-01 MED ORDER — TRIAMTERENE-HCTZ 37.5-25 MG PO TABS: ORAL_TABLET | ORAL | Status: DC

## 2013-10-01 MED ORDER — LOSARTAN POTASSIUM 100 MG PO TABS: 100.0000 mg | ORAL_TABLET | Freq: Every day | ORAL | Status: DC

## 2013-10-01 MED ORDER — METFORMIN HCL 1000 MG PO TABS: ORAL_TABLET | ORAL | Status: DC

## 2013-10-01 MED ORDER — SIMVASTATIN 40 MG PO TABS: ORAL_TABLET | ORAL | Status: DC

## 2013-10-01 NOTE — Patient Instructions (Signed)
Get your blood work before you leave   Next visit in  6 months   for a diabetes hypertension   follow up (30 minutes) . No Fasting Please make an appointment     Fall Prevention and Home Safety Falls cause injuries and can affect all age groups. It is possible to use preventive measures to significantly decrease the likelihood of falls. There are many simple measures which can make your home safer and prevent falls. OUTDOORS  Repair cracks and edges of walkways and driveways.  Remove high doorway thresholds.  Trim shrubbery on the main path into your home.  Have good outside lighting.  Clear walkways of tools, rocks, debris, and clutter.  Check that handrails are not broken and are securely fastened. Both sides of steps should have handrails.  Have leaves, snow, and ice cleared regularly.  Use sand or salt on walkways during winter months.  In the garage, clean up grease or oil spills. BATHROOM  Install night lights.  Install grab bars by the toilet and in the tub and shower.  Use non-skid mats or decals in the tub or shower.  Place a plastic non-slip stool in the shower to sit on, if needed.  Keep floors dry and clean up all water on the floor immediately.  Remove soap buildup in the tub or shower on a regular basis.  Secure bath mats with non-slip, double-sided rug tape.  Remove throw rugs and tripping hazards from the floors. BEDROOMS  Install night lights.  Make sure a bedside light is easy to reach.  Do not use oversized bedding.  Keep a telephone by your bedside.  Have a firm chair with side arms to use for getting dressed.  Remove throw rugs and tripping hazards from the floor. KITCHEN  Keep handles on pots and pans turned toward the center of the stove. Use back burners when possible.  Clean up spills quickly and allow time for drying.  Avoid walking on wet floors.  Avoid hot utensils and knives.  Position shelves so they are not too high or  low.  Place commonly used objects within easy reach.  If necessary, use a sturdy step stool with a grab bar when reaching.  Keep electrical cables out of the way.  Do not use floor polish or wax that makes floors slippery. If you must use wax, use non-skid floor wax.  Remove throw rugs and tripping hazards from the floor. STAIRWAYS  Never leave objects on stairs.  Place handrails on both sides of stairways and use them. Fix any loose handrails. Make sure handrails on both sides of the stairways are as long as the stairs.  Check carpeting to make sure it is firmly attached along stairs. Make repairs to worn or loose carpet promptly.  Avoid placing throw rugs at the top or bottom of stairways, or properly secure the rug with carpet tape to prevent slippage. Get rid of throw rugs, if possible.  Have an electrician put in a light switch at the top and bottom of the stairs. OTHER FALL PREVENTION TIPS  Wear low-heel or rubber-soled shoes that are supportive and fit well. Wear closed toe shoes.  When using a stepladder, make sure it is fully opened and both spreaders are firmly locked. Do not climb a closed stepladder.  Add color or contrast paint or tape to grab bars and handrails in your home. Place contrasting color strips on first and last steps.  Learn and use mobility aids as needed. Install  an electrical emergency response system.  Turn on lights to avoid dark areas. Replace light bulbs that burn out immediately. Get light switches that glow.  Arrange furniture to create clear pathways. Keep furniture in the same place.  Firmly attach carpet with non-skid or double-sided tape.  Eliminate uneven floor surfaces.  Select a carpet pattern that does not visually hide the edge of steps.  Be aware of all pets. OTHER HOME SAFETY TIPS  Set the water temperature for 120 F (48.8 C).  Keep emergency numbers on or near the telephone.  Keep smoke detectors on every level of the  home and near sleeping areas. Document Released: 11/16/2002 Document Revised: 05/27/2012 Document Reviewed: 02/15/2012 [facility] Patient Information 2014 [place].

## 2013-10-01 NOTE — Assessment & Plan Note (Addendum)
Td 2003,and today pneumonia shot 2008, shingles shot 2009 Had a Flu shot  already  C scope 2006 (-) but incomplete, (-) ACBE; per GI notes :neg virtual Cscope 4-11, considering a cscope 2016  (GI Dr Dulce Sellar); h/o tortuos colon  Sees  Gyn: Dr. Pennie Rushing [facility] 04/2012--2 benign findings , next per gyn Diet and exercise discussed Needs a referral to audiology

## 2013-10-01 NOTE — Assessment & Plan Note (Signed)
Well controlled with current meds, no change, check a CMP

## 2013-10-01 NOTE — Assessment & Plan Note (Signed)
Bone density test 05/2012 normal

## 2013-10-01 NOTE — Assessment & Plan Note (Signed)
On metformin losartan, check labs

## 2013-10-01 NOTE — Progress Notes (Signed)
Subjective:    Patient ID: [name], female    DOB: 14-Jun-1941, 72 y.o.   MRN: [id number]  HPI  HPI Here for Medicare AWV: 1. Risk factors based on Past M, S, F history: reviewed 2. Physical Activities:  Sedentary, plans to go back to exercise    3. Depression/mood: doing great, neg screening 4. Hearing:  mild  problems on the R if not in a quite place----- referral 5. ADL's:  Completely independent, still drives   6. Fall Risk: 2 falls, slipped and fall, no serious injury, see instructions 7. home Safety: does feel safe at home   8. Height, weight, &visual acuity: see VS,  sees the eye doctor  9. Counseling: provided 10. Labs ordered based on risk factors: if needed   11. Referral Coordination: if needed 12.  Care Plan, see assessment and plan   13.   Cognitive Assessment: Cognition  and motor skills appropriate  In addition, today we discussed the following: Diabetes, good compliance with metformin, check CBGs sporadically, always within normal Hypertension, good medication compliance, ambulatory BP goal is within normal. Insomnia, on Restoril, not helping much. Hyperlipidemia, good compliance with medications. Asthma, not taking Symbicort, she read some negative views online. Taking albuterol x4-5 /week  Past Medical History  Diagnosis Date  . Diabetes mellitus   . Hyperlipidemia   . Asthma     dx in adulthood (72 y/o) PFT 09/08/09 FEV1 1.70/81%; FEV1/FVC 0.75; +resp to dilartor; NI LV/DLCO  . Breast CA     hx of bilateral diagnosied in 2006, recurred 2011- Dr Claria Dice  . Colonic polyp   . Osteopenia   . Allergic rhinitis   . OSA (obstructive sleep apnea)     CPAP intolerant  . Peripheral neuropathy   . COPD (chronic obstructive pulmonary disease)   . Chronic constipation   . HTN (hypertension) 11-2011        Past Surgical History  Procedure Laterality Date  . Appendectomy    . Mastectomy  2006    bilateral  . Pilonidal cyst surgery       ? of  . Flexible sigmoidoscopy N/A 01/31/2013    Procedure: FLEXIBLE SIGMOIDOSCOPY;  Surgeon: Shirley Friar, MD;  Location: [facility] ENDOSCOPY;  Service: Endoscopy;  Laterality: N/A;  unprepped with sedation   History   Social History  . Marital Status: Widowed    Spouse Name: N/A    Number of Children: 2  . Years of Education: N/A   Occupational History  . runs a non-profit    Social History Main Topics  . Smoking status: Former Games developer  . Smokeless tobacco: Never Used     Comment: Quit at age 89  . Alcohol Use: No  . Drug Use: No  . Sexual Activity: Not Currently   Other Topics Concern  . Not on file   Social History Narrative   Widowed last husband 4-10, lives alone, retired Runner, broadcasting/film/video   2 children, GK twins     Family History  Problem Relation Age of Onset  . Breast cancer Other     GM,sister  . Colon cancer Neg Hx   . Diabetes Brother   . Heart attack Neg Hx     no h/o early diseae  . COPD Mother     smoker  . Hypertension Mother   . Cancer Father     lung   Review of Systems occ  diplopia when really tired, saw ophthalmology, denies headaches-dizzines, was rec observation No  CP, SOB, lower extremity edema Denies  nausea, vomiting diarrhea Denies  blood in the stools  occ wheezing, (-)hemoptysis No dysuria, gross hematuria, difficulty urinating       Physical Exam BP 133/79  Pulse 79  Temp(Src) 99.1 F (37.3 C)  Ht 5\' 3"  (1.6 m)  Wt 188 lb (85.276 kg)  BMI 33.31 kg/m2  SpO2 96% General -- alert, well-developed, NAD.  Neck --no thyromegaly  Lungs -- normal respiratory effort, no intercostal retractions, no accessory muscle use, and normal breath sounds.  Heart-- normal rate, regular rhythm, no murmur.  Abdomen-- Not distended, good bowel sounds,soft, slt tender without mass or rebound at the left lower quadrant, a chronic finding.. Extremities-- no pretibial edema bilaterally  Neurologic--  alert & oriented X3. Speech normal, gait normal,  strength normal in all extremities.  EOMI, PERLA   Psych-- Cognition and judgment appear intact. Cooperative with normal attention span and concentration. No anxious appearing , no depressed appearing.     Assessment & Plan:

## 2013-10-01 NOTE — Assessment & Plan Note (Signed)
Compliance with medication, simvastatin 40 mg daily, check labs (had some orange juice today and a coffee)

## 2013-10-07 ENCOUNTER — Telehealth: Payer: Self-pay | Admitting: *Deleted

## 2013-10-07 ENCOUNTER — Other Ambulatory Visit: Payer: Self-pay | Admitting: *Deleted

## 2013-10-07 MED ORDER — POTASSIUM CHLORIDE ER 10 MEQ PO TBCR: 10.0000 meq | EXTENDED_RELEASE_TABLET | Freq: Every day | ORAL | Status: DC

## 2013-10-07 NOTE — Addendum Note (Signed)
Addended by: Eustace Quail on: 10/07/2013 03:16 PM   Modules accepted: Orders

## 2013-10-07 NOTE — Telephone Encounter (Signed)
rx sent. Lmovm. DJR

## 2013-10-07 NOTE — Telephone Encounter (Signed)
Message copied by Eustace Quail on Wed Oct 07, 2013  3:06 PM ------      Message from: [name]      Created: Sun Oct 04, 2013  4:15 PM       [name], call the patient:      Her potassium is slightly low, call KCl 10 mEq one daily #30, no refills.Will recheck in 6 months when she comes back.      Her blood sugar and cholesterol are under excellent control, all other labs normal. These are good results. I will release her results to mychart as well ------

## 2013-10-08 ENCOUNTER — Telehealth: Payer: Self-pay | Admitting: Internal Medicine

## 2013-10-08 ENCOUNTER — Telehealth: Payer: Self-pay | Admitting: *Deleted

## 2013-10-08 ENCOUNTER — Encounter: Payer: Self-pay | Admitting: Adult Health

## 2013-10-08 ENCOUNTER — Ambulatory Visit (HOSPITAL_BASED_OUTPATIENT_CLINIC_OR_DEPARTMENT_OTHER): Payer: Medicare PPO | Admitting: Lab

## 2013-10-08 ENCOUNTER — Ambulatory Visit (HOSPITAL_BASED_OUTPATIENT_CLINIC_OR_DEPARTMENT_OTHER): Payer: Medicare PPO | Admitting: Adult Health

## 2013-10-08 VITALS — BP 124/81 | HR 82 | Temp 98.8°F | Resp 18 | Ht 63.0 in | Wt 188.4 lb

## 2013-10-08 DIAGNOSIS — E559 Vitamin D deficiency, unspecified: Secondary | ICD-10-CM

## 2013-10-08 DIAGNOSIS — C50219 Malignant neoplasm of upper-inner quadrant of unspecified female breast: Secondary | ICD-10-CM

## 2013-10-08 DIAGNOSIS — C50919 Malignant neoplasm of unspecified site of unspecified female breast: Secondary | ICD-10-CM

## 2013-10-08 DIAGNOSIS — Z87898 Personal history of other specified conditions: Secondary | ICD-10-CM

## 2013-10-08 DIAGNOSIS — Z853 Personal history of malignant neoplasm of breast: Secondary | ICD-10-CM

## 2013-10-08 DIAGNOSIS — R5381 Other malaise: Secondary | ICD-10-CM

## 2013-10-08 DIAGNOSIS — Z9010 Acquired absence of unspecified breast and nipple: Secondary | ICD-10-CM

## 2013-10-08 DIAGNOSIS — J45909 Unspecified asthma, uncomplicated: Secondary | ICD-10-CM

## 2013-10-08 NOTE — Telephone Encounter (Signed)
appts made and printed...td 

## 2013-10-08 NOTE — Telephone Encounter (Signed)
lmomtcb  

## 2013-10-08 NOTE — Progress Notes (Addendum)
OFFICE PROGRESS NOTE  CC**  Joanne Ora, MD [phone] [address] [facility] [address] 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    PRIOR THERAPY: 1. Patient underwent a screening mammogram and abnormalities were detected in her breasts.  Biopsy was positive for invasive cancer.  She underwent a bilateral mastectomy on 01/30/05 by Dr. Johna Sheriff.  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  2. Patient later had recurrence along the left mastectomy site of invasive ductal carcinoma, lymph nodes were negative and it was excised on 04/18/10, she underwent radiation therapy, followed by adjuvant Letrozole.    CURRENT THERAPY: Letrozole daily  INTERVAL HISTORY: [name] 72 y.o. female returns for follow up of her recurrent invasive breast cancer.  She is doing well.  She is taking Letroxole daily and has tolerable hot flashes. She is slightly fatigued, but otherwise, she denies any new aches or pains, fevers, chills, night sweats, unintentional weight loss, pain, any other concerns.    MEDICAL HISTORY: Past Medical History  Diagnosis Date  . Diabetes mellitus   . Hyperlipidemia   . Asthma     dx in adulthood (72 y/o) PFT 09/08/09 FEV1 1.70/81%; FEV1/FVC 0.75; +resp to dilartor; NI LV/DLCO  . Breast CA     hx of bilateral diagnosied in 2006, recurred 2011- Dr Claria Dice  . Colonic polyp   . Osteopenia   . Allergic rhinitis   . OSA (obstructive sleep apnea)     CPAP intolerant  . Peripheral neuropathy   . COPD (chronic obstructive pulmonary disease)   . Chronic constipation   . HTN (hypertension) 11-2011     ALLERGIES:  is allergic to food; peanut-containing drug products; and penicillins.  MEDICATIONS:  Current Outpatient Prescriptions  Medication Sig Dispense Refill  . albuterol (ACCUNEB) 0.63 MG/3ML nebulizer solution Take 1 ampule by nebulization 4 (four) times daily as needed.      Marland Kitchen aspirin 81 MG tablet Take 81 mg by mouth daily.       . diphenhydrAMINE (BENADRYL) 25 MG tablet Take 50 mg by mouth daily as needed. For swelling      . letrozole (FEMARA) 2.5 MG tablet TAKE 1 TABLET (2.5 MG TOTAL) BY MOUTH DAILY.  30 tablet  3  . losartan (COZAAR) 100 MG tablet Take 1 tablet (100 mg total) by mouth daily.  90 tablet  2  . magnesium hydroxide (MILK OF MAGNESIA) 800 MG/5ML suspension Take 5 mLs by mouth every evening.        . metFORMIN (GLUCOPHAGE) 1000 MG tablet TAKE 1 TABLET EVERY DAY  90 tablet  2  . polyethylene glycol (MIRALAX / GLYCOLAX) packet Take 17 g by mouth daily.  30 each  0  . potassium chloride (K-DUR) 10 MEQ tablet Take 1 tablet (10 mEq total) by mouth daily.  30 tablet  0  . simvastatin (ZOCOR) 40 MG tablet TAKE ONE TABLET BY MOUTH EVERY DAY AT BEDTIME  90 tablet  2  . albuterol (PROVENTIL HFA;VENTOLIN HFA) 108 (90 BASE) MCG/ACT inhaler Inhale 2 puffs into the lungs every 4 (four) hours as needed for wheezing or shortness of breath.  1 Inhaler  prn  . budesonide-formoterol (SYMBICORT) 160-4.5 MCG/ACT inhaler Inhale 2 puffs into the lungs 2 (two) times daily. Rinse mouth well. Maintenance inhaler.  1 Inhaler  prn  . EPINEPHrine (EPI-PEN) 0.3 mg/0.3 mL DEVI Inject 0.3 mLs (0.3 mg total) into the muscle once.  1 Device  0  . Simethicone (GAS-X PO) Take 2 tablets by mouth once.      . temazepam (RESTORIL) 15 MG capsule Take 1 capsule (15 mg total) by mouth at bedtime as needed for sleep (1 or 2 as needed for sleep).  30 capsule  0  . triamterene-hydrochlorothiazide (MAXZIDE-25) 37.5-25 MG per tablet TAKE 1 TABLET BY MOUTH DAILY  90 tablet  2   No current facility-administered  medications for this visit.    SURGICAL HISTORY:  Past Surgical History  Procedure Laterality Date  . Appendectomy    . Mastectomy  2006    bilateral  . Pilonidal cyst surgery      ? of  . Flexible sigmoidoscopy N/A 01/31/2013    Procedure: FLEXIBLE SIGMOIDOSCOPY;  Surgeon: Shirley Friar, MD;  Location: [facility] ENDOSCOPY;  Service: Endoscopy;  Laterality: N/A;  unprepped with sedation    REVIEW OF SYSTEMS:  A 10 point review of systems was conducted and is otherwise negative except for what is noted above.     HEALTH MAINTENANCE:  Mammogram5/28/13 Colonoscopy 12/10/04, flex sig on 01/2013 Bone Density 06/06/12-normal   PHYSICAL EXAMINATION: Blood pressure 124/81, pulse 82, temperature 98.8 F (37.1 C), temperature source Oral, resp. rate 18, height 5\' 3"  (1.6 m), weight 188 lb 6.4 oz (85.458 kg). Body mass index is 33.38 kg/(m^2). General: Patient is a well appearing female in no acute distress HEENT: PERRLA, sclerae anicteric no conjunctival pallor, MMM Neck: supple, no palpable adenopathy Lungs: clear to auscultation bilaterally, no wheezes, rhonchi, or rales Cardiovascular: regular rate rhythm, S1, S2, no murmurs, rubs or gallops Abdomen: Soft, non-tender, non-distended, normoactive bowel sounds, no HSM Extremities: warm and well perfused, no clubbing, cyanosis, or edema Skin: No rashes or lesions Neuro: Non-focal Breasts: right mastectomy site no nodularity, no sign of recurrence, left mastectomy, no nodularity, no sign of recurrence ECOG PERFORMANCE STATUS: 1 - Symptomatic but completely ambulatory  LABORATORY DATA: Lab Results  Component Value Date   WBC 10.1 10/01/2013   HGB 14.3 10/01/2013   HCT 42.6 10/01/2013   MCV 90.1 10/01/2013   PLT 360.0 10/01/2013      Chemistry      Component Value Date/Time   NA 137 10/01/2013 1141   NA 142 12/08/2012 1409   K 3.3* 10/01/2013 1141   K 3.9 12/08/2012 1409   CL 96 10/01/2013 1141   CL 105 12/08/2012 1409   CO2 30  10/01/2013 1141   CO2 26 12/08/2012 1409   BUN 15 10/01/2013 1141   BUN 13.0 12/08/2012 1409   CREATININE 0.8 10/01/2013 1141   CREATININE 0.8 12/08/2012 1409      Component Value Date/Time   CALCIUM 9.9 10/01/2013 1141   CALCIUM 10.1 12/08/2012 1409   ALKPHOS 67 10/01/2013 1141   ALKPHOS 72 12/08/2012 1409   AST 23 10/01/2013 1141   AST 17 12/08/2012 1409   ALT 17 10/01/2013 1141   ALT 10 12/08/2012 1409   BILITOT 0.7 10/01/2013 1141   BILITOT 0.54 12/08/2012 1409       RADIOGRAPHIC STUDIES:  No results found.  ASSESSMENT: Patient is a 72 year old female who was originally diagnosed with DCIS in the right breast  and stage I invasive mammary carcinoma in the left breast.  She underwent bilateral mastectomy and later had a recurrence in 2011 along her left mastectomy site.  She underwent re-excision, radiation therapy, followed by Letrozole daily.  She continues to take Letrozole.    PLAN:  1. Patient is doing well and has no sign of recurrence. We discussed her fatigue, and her PCP has ran many tests.  They have not checked her vitamin D level, so I will do that today as she reveales that she has a h/o vitamin d deficiency from 3 years ago.    2. We discussed her health maintenance and survivorship.  She will continue Letrozole daily.   3.  She will return in 6 months for follow up and evaluation.    All questions were answered. The patient knows to call the clinic with any problems, questions or concerns. We can certainly see the patient much sooner if necessary.  I spent 40 minutes counseling the patient face to face. The total time spent in the appointment was 60 minutes.   Illa Level, NP Medical Oncology [facility] [phone] 10/08/2013, 1:36 PM  ATTENDING'S ATTESTATION:  I personally reviewed patient's chart, examined patient myself, formulated the treatment plan as followed.    72 year old female with history of DCIS of the right  breast and stage I invasive adenocarcinoma of the left breast. She is having bilateral mastectomies. She did later have recurrence in 2008 11 and underwent a left mastectomy. Clinically doing well she's been on letrozole. She was originally seen Dr. Pierce Crane. We will continue the letrozole on a daily basis. And I will see her back in 6 months time for followup.  Drue Second, MD Medical/Oncology [facility] [phone] (beeper) [phone] (Office)  10/15/2013, 6:52 PM

## 2013-10-09 LAB — VITAMIN D 25 HYDROXY (VIT D DEFICIENCY, FRACTURES): Vit D, 25-Hydroxy: 22 ng/mL — ABNORMAL LOW (ref 30–89)

## 2013-10-09 NOTE — Telephone Encounter (Signed)
Pt called back and she stated that she will need a new portable nebulizer machine.  She stated that she cannot remember who she used the last time but would like to use [facility] for this.  CY please advise if ok to send in this order.  Thanks  Allergies  Allergen Reactions  . Food     nuts  . Peanut-Containing Drug Products   . Penicillins Other (See Comments)    REACTION: outer rectal itching   Last ov--08/21/2013 Next ov--02/18/2014  Current Outpatient Prescriptions on File Prior to Visit  Medication Sig Dispense Refill  . albuterol (ACCUNEB) 0.63 MG/3ML nebulizer solution Take 1 ampule by nebulization 4 (four) times daily as needed.      Marland Kitchen albuterol (PROVENTIL HFA;VENTOLIN HFA) 108 (90 BASE) MCG/ACT inhaler Inhale 2 puffs into the lungs every 4 (four) hours as needed for wheezing or shortness of breath.  1 Inhaler  prn  . aspirin 81 MG tablet Take 81 mg by mouth daily.       . budesonide-formoterol (SYMBICORT) 160-4.5 MCG/ACT inhaler Inhale 2 puffs into the lungs 2 (two) times daily. Rinse mouth well. Maintenance inhaler.  1 Inhaler  prn  . diphenhydrAMINE (BENADRYL) 25 MG tablet Take 50 mg by mouth daily as needed. For swelling      . EPINEPHrine (EPI-PEN) 0.3 mg/0.3 mL DEVI Inject 0.3 mLs (0.3 mg total) into the muscle once.  1 Device  0  . letrozole (FEMARA) 2.5 MG tablet TAKE 1 TABLET (2.5 MG TOTAL) BY MOUTH DAILY.  30 tablet  3  . losartan (COZAAR) 100 MG tablet Take 1 tablet (100 mg total) by mouth daily.  90 tablet  2  . magnesium hydroxide (MILK OF MAGNESIA) 800 MG/5ML suspension Take 5 mLs by mouth every evening.        . metFORMIN (GLUCOPHAGE) 1000 MG tablet TAKE 1 TABLET EVERY DAY  90 tablet  2  . polyethylene glycol (MIRALAX / GLYCOLAX) packet Take 17 g by mouth daily.  30 each  0  . potassium chloride (K-DUR) 10 MEQ tablet Take 1 tablet (10 mEq total) by mouth daily.  30 tablet  0  . Simethicone (GAS-X PO) Take 2 tablets by mouth once.      . simvastatin (ZOCOR) 40 MG tablet TAKE  ONE TABLET BY MOUTH EVERY DAY AT BEDTIME  90 tablet  2  . temazepam (RESTORIL) 15 MG capsule Take 1 capsule (15 mg total) by mouth at bedtime as needed for sleep (1 or 2 as needed for sleep).  30 capsule  0  . triamterene-hydrochlorothiazide (MAXZIDE-25) 37.5-25 MG per tablet TAKE 1 TABLET BY MOUTH DAILY  90 tablet  2   No current facility-administered medications on file prior to visit.

## 2013-10-09 NOTE — Telephone Encounter (Signed)
Ok to order- DME Advanced nebulizer machine for dx asthma                     Rx albuterol 0.083, # 50, 1 neb up to 4 times daily as needed, refill prn

## 2013-10-09 NOTE — Telephone Encounter (Signed)
Order has been placed to [facility] for the nebulizer and the albuterol QID prn.  Message has been sent to [facility] with [facility].

## 2013-10-18 ENCOUNTER — Other Ambulatory Visit: Payer: Self-pay | Admitting: Internal Medicine

## 2013-10-19 ENCOUNTER — Other Ambulatory Visit: Payer: Self-pay | Admitting: *Deleted

## 2013-10-19 ENCOUNTER — Telehealth: Payer: Self-pay | Admitting: *Deleted

## 2013-10-19 MED ORDER — MONTELUKAST SODIUM 10 MG PO TABS: 10.0000 mg | ORAL_TABLET | Freq: Every day | ORAL | Status: DC

## 2013-10-19 NOTE — Telephone Encounter (Signed)
Montelukast refilled

## 2013-10-19 NOTE — Telephone Encounter (Signed)
Advise patient, if she feels it was helping her allergies ok to restart,   call in #30 and 3 refills. If she has severe symptoms she needs to be seen

## 2013-10-19 NOTE — Telephone Encounter (Signed)
montelukast (SINGULAIR) tablet 10 mg Last refill: 01/31/2013 Medication is not on current med list. Please advise if refill is appropriate.

## 2013-10-19 NOTE — Telephone Encounter (Signed)
Done

## 2013-11-08 ENCOUNTER — Other Ambulatory Visit: Payer: Self-pay | Admitting: Internal Medicine

## 2013-11-09 NOTE — Telephone Encounter (Signed)
Klor-con refilled per protocol 

## 2013-11-26 ENCOUNTER — Ambulatory Visit (INDEPENDENT_AMBULATORY_CARE_PROVIDER_SITE_OTHER): Payer: Medicare PPO | Admitting: General Surgery

## 2013-12-30 ENCOUNTER — Ambulatory Visit (INDEPENDENT_AMBULATORY_CARE_PROVIDER_SITE_OTHER): Payer: Medicare Other | Admitting: General Surgery

## 2013-12-30 VITALS — BP 126/76 | HR 90 | Resp 18 | Ht 63.5 in | Wt 192.0 lb

## 2013-12-30 DIAGNOSIS — Z853 Personal history of malignant neoplasm of breast: Secondary | ICD-10-CM

## 2013-12-30 NOTE — Progress Notes (Signed)
History: Patient returns for routine followup for breast cancer with history of bilateral mastectomy for DCIS on the right and T1 invasive cancer on the left in 2006.[name] She has a history of an axillary skin recurrence in 2011 status post excision and radiation and now on adjuvant letrazole. She reports no problems in terms of her chest wall or skin or arm swelling. She has a few hot flashes.  Exam: BP 126/76  Pulse 90  Resp 18  Ht 5' 3.5" (1.613 m)  Wt 192 lb (87.091 kg)  BMI 33.47 kg/m2 General: Overweight but otherwise healthy-appearing Afro-American female Skin: Warm and dry without rash infection Lymph nodes: No cervical, supraclavicular or axillary nodes palpable Lungs: Clear equal breath sounds bilaterally Chest wall: Status post bilateral mastectomy. Mild postradiation changes on the left. There is no skin thickening or mass or other evidence of recurrence.  Assessment and plan: History bilateral mastectomy for DCIS and invasive cancer with history of skin recurrence on the left and radiation. On hormonal treatment. No clinical evidence of recurrence. Return in 6 months.

## 2014-01-07 ENCOUNTER — Other Ambulatory Visit: Payer: Self-pay | Admitting: Internal Medicine

## 2014-01-18 ENCOUNTER — Other Ambulatory Visit: Payer: Self-pay | Admitting: *Deleted

## 2014-01-19 ENCOUNTER — Other Ambulatory Visit: Payer: Self-pay | Admitting: Internal Medicine

## 2014-01-19 ENCOUNTER — Telehealth: Payer: Self-pay | Admitting: *Deleted

## 2014-01-19 NOTE — Telephone Encounter (Signed)
Patient left message on triage line requesting refill for test strips. Patient did not specify what type of test strips she uses, and its not noted in her medication list. I called and left message for patient to please return phone call and specify name. JG//CMA

## 2014-01-20 ENCOUNTER — Other Ambulatory Visit: Payer: Self-pay | Admitting: *Deleted

## 2014-01-20 MED ORDER — GLUCOSE BLOOD VI STRP: ORAL_STRIP | Status: DC

## 2014-01-21 ENCOUNTER — Ambulatory Visit: Payer: Self-pay | Admitting: Podiatrist

## 2014-02-18 ENCOUNTER — Encounter: Payer: Self-pay | Admitting: Internal Medicine

## 2014-02-18 ENCOUNTER — Ambulatory Visit (INDEPENDENT_AMBULATORY_CARE_PROVIDER_SITE_OTHER): Payer: Medicare Other | Admitting: Internal Medicine

## 2014-02-18 VITALS — BP 132/68 | HR 78

## 2014-02-18 DIAGNOSIS — J45909 Unspecified asthma, uncomplicated: Secondary | ICD-10-CM

## 2014-02-18 DIAGNOSIS — J45998 Other asthma: Secondary | ICD-10-CM

## 2014-02-18 MED ORDER — BECLOMETHASONE DIPROPIONATE 80 MCG/ACT IN AERS: 2.0000 | INHALATION_SPRAY | Freq: Two times a day (BID) | RESPIRATORY_TRACT | Status: DC

## 2014-02-18 NOTE — Patient Instructions (Addendum)
  QVAR 80 sample and Rx printed- 2 puffs and RINSE mouth twice daily-this replaces your Symbicort

## 2014-02-18 NOTE — Progress Notes (Signed)
Patient ID: [name] Nurse, female    DOB: December 08, 1941, 73 y.o.   MRN: [id number]  HPI 07/16/11- 54 yoF former smoker followed for OSA/ failed CPAP, asthma, allergic rhinitis complicated by DM, hx breast cancer Blames heat for need to resume using her rescue inhaler..  She associates purple areas on lips from using her nebulizer machine- ? Hard enough to bruise? She couldn't tolerate and stopped using CPAP. We reviewed symptoms and warning signs again.  She has had spots on hands, pruritic, " inconclusive " on biopsy by dermatologist, Dr Nevada Crane. Admits night sweats, cough, scant yellow phlegm.  Last CXR 12/27/10 f/u for recurrent breast cancer- left.    10/16/11- 25 yoF former smoker followed for OSA/ failed CPAP, asthma, allergic rhinitis complicated by DM, hx breast cancer She reports an urticarial reaction to peanuts 2 months ago and is unsure if she has had a reaction in the past 2 chocolate. She would like to look at food allergy testing so she knows better to talk to watch out for. We discussed food allergy versus intolerance.  She is taking prednisone and valacyclovir for a hyperpigmented rash on her hands, after bx by dermatologist. Possibly viral warts. Allergy symptoms do better in cold weather but will also improve on her steroids. Cough has been drier with scant clear sputum. She has not needed her nebulizer machine at all.  Chest x-ray: 07/16/2011-NAD, mild CE, bronchitis changes, arthritis in spine.   04/15/12- 70 yoF former smoker followed for OSA/ failed CPAP, asthma, allergic rhinitis, Food Allergy complicated by DM, hx breast cancer Had 2 chocolate chip cookies yesterday-started itching and swelling; did not go to ER; still itching today. On May 6 she 82 chocolate chip cookies, not recognized contained nuts. 4-5 hours later she developed facial swelling/angioedema with some itching but no wheeze. She took Benadryl. She continues to have some swelling in the left side of her  face now 24 hours later. She never had any increased wheeze throat swelling or other airway discomfort. She has noticed increased wheeze and dry cough over the last 2 months, responsive to her rescue inhaler. Additional problem of rash on her arms which begins like pimples but develops a hyperpigmented area about 3 cm diameter which response to steroid cream. Dermatologist did biopsy. Results nonspecific. No relation noted between this rash and her other health problems, exposures or other triggers.  06/19/12- 70 yoF former smoker followed for OSA/ failed CPAP, asthma, allergic rhinitis, Food Allergy complicated by DM, hx breast cancer Had to recently go to ER for throat swelling-unsure of cause. Not sleeping well at times and doesnt use CPAP recently-has had more stressors going on to cause this. We reviewed her ER note. Over several months she's had episodes of itching hands then swelling of one side or the other of her tongue that July 7 she had angioedema in her throat. There is no family history of angioedema and she has not been on ACE inhibitors. She avoids known foods that cause problems in the past. 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 None clearly in significant range. She did not take Benadryl as recommended that the emergency room because of listed side effect of sleepiness. We discussed alternatives. She has an EpiPen if needed.  08/22/12-  70 yoF former smoker followed for OSA/ failed CPAP, asthma, allergic rhinitis, Food Allergy complicated by DM, hx breast cancer Cough-clear in color; using rescue inhaler more than usual; Increased cough lying in  bed and aware of reflux. Insomnia wakes her around 2 AM but she does not think that is from coughing. She has been irregular using Symbicort, trying to use it as a rescue inhaler. We discussed that. Some increase in exertional dyspnea.  12/23/12- 15 yoF former smoker followed for OSA/ failed  CPAP, asthma, allergic rhinitis, Food Allergy, GERD, complicated by DM, hx breast cancer/ XRT FOLLOWS FOR: whole month of December has had congestion-cough-clear in color;some of its related to reflux and some due to the weather per patient. Had a cold. Chest congestion with clear mucus. Using rescue inhaler twice daily now but has not needed nebulizer. Continues Symbicort. Nothing purulent and no fever or blood. Sonata not enough to keep her asleep. Failed NyQuil. Afraid of sleepwalking with Ambien. Snoring status unclear  04/20/13- 71 yoF former smoker followed for OSA/ failed CPAP, asthma, allergic rhinitis, Food Allergy, GERD, complicated by DM, hx breast cancer/ XRT FOLLOWS FOR: increased allergies due to pollen increased; has also notcied certain smells or things that she may have eaten cause slight problems; usually can use rescue inhaler and this calms things down.  Also,pt states that her sleep patterns are not doing well-some night no sleep at all. Blames cough on pollen/ sinus drip. Dry cough- inhaler helps. May reflux a little at times, but no dysphagia. Occasional insomnia- busy brain. 2 x zaleplon sufficient.. CXR 12/30/12 IMPRESSION:  No active cardiopulmonary disease.  Original Report Authenticated By: Rolm Baptise, M.D.   08/21/13-71 yoF former smoker followed for OSA/ failed CPAP, asthma, allergic rhinitis, Food Allergy, GERD, complicated by DM, hx breast cancer/ XRT FOLLOWS FOR:  Symptoms worsening x3 months, as of yesterday throat irritated and sore Exercising less, which she blames for increased dyspnea with exertion. Using metered inhaler more, blames weather. Lis ear ache. Insomnia-still wakes occasionally but stopped using temazepam or Sonata as unhelpful.  02/18/14- 67 yoF former smoker followed for OSA/ failed CPAP, asthma, allergic rhinitis, Food Allergy, GERD, complicated by DM, hx breast cancer/ XRT FOLLOWS FOR: With the increase in humidity pt feels more congested. C/o  frequent producutive cough with white mucus throughout day 3 weeks. Pt states she has episodes of quick sharp chest pain in the left anterior chest and right lateral  chest.  3 weeks of increased cough with clear thick sputum. Does not feel sick- not a cold. She is not using Symbicort after seeing the hands warning of possible sudden death from the long-acting bronchodilator component. We discussed this and the alternatives.  Review of Systems-See HPI Constitutional:   No-   weight loss, night sweats, fevers, chills, fatigue, lassitude. HEENT:   No-  headaches, difficulty swallowing, tooth/dental problems, sore throat,       No-  sneezing, itching, +ear ache, nasal congestion, +post nasal drip,  CV:  No-   chest pain, orthopnea, PND, swelling in lower extremities, anasarca, dizziness, palpitations Resp: + shortness of breath with exertion or at rest.              No-  productive cough, no- non-productive cough,  No- coughing up of blood.              No-   change in color of mucus.   Wheezing+.   Skin: No-   rash or lesions. GI:  +   heartburn, indigestion, no-abdominal pain, nausea, vomiting, GU:  MS:  No-   joint pain or swelling. . Neuro-     nothing unusual Psych:  No- change in mood or  affect. No depression or anxiety.  No memory loss.  Objective:   Physical Exam General- Alert, Oriented, Affect-appropriate, Distress- none acute    Overweight.  Skin- clear Lymphadenopathy- none Head- atraumatic            Eyes- Gross vision intact, PERRLA, conjunctivae clear secretions            Ears- Hearing, canals normal but has cotton L earis            Nose- Clear, no-Septal dev, mucus, polyps, erosion, perforation             Throat- Mallampati III , mucosa clear , drainage- none, tonsils- atrophic Neck- flexible , trachea midline, no stridor , thyroid nl, carotid no bruit Chest - symmetrical excursion , unlabored           Heart/CV- RRR , no murmur , no gallop  , no rub, nl s1 s2                            - JVD- none , edema- none, stasis changes- none, varices- none           Lung- wheeze+ end expiratory, +dry cough, unlabored,  dullness-none, rub- none           Chest wall-  Abd-  Br/ Gen/ Rectal- Not done, not indicated Extrem- cyanosis- none, clubbing, none, atrophy- none, strength- nl Neuro- grossly intact to observation

## 2014-02-18 NOTE — Assessment & Plan Note (Signed)
Mild exacerbation. From the timing, this could be viral or early spring allergy. Plan-educated about long-acting bronchodilators. Try replacing Symbicort which Qvar 80

## 2014-02-19 ENCOUNTER — Emergency Department (INDEPENDENT_AMBULATORY_CARE_PROVIDER_SITE_OTHER): Payer: Medicare Other

## 2014-02-19 ENCOUNTER — Encounter (HOSPITAL_COMMUNITY): Payer: Self-pay | Admitting: Emergency Medicine

## 2014-02-19 ENCOUNTER — Emergency Department (HOSPITAL_COMMUNITY)
Admission: EM | Admit: 2014-02-19 | Discharge: 2014-02-19 | Disposition: A | Discharge status: Home / Self Care | Payer: Medicare Other | Source: Home / Self Care | Attending: Family Medicine | Admitting: Family Medicine

## 2014-02-19 DIAGNOSIS — S93609A Unspecified sprain of unspecified foot, initial encounter: Secondary | ICD-10-CM

## 2014-02-19 MED ORDER — TRAMADOL HCL 50 MG PO TABS: 50.0000 mg | ORAL_TABLET | Freq: Four times a day (QID) | ORAL | Status: DC | PRN

## 2014-02-19 NOTE — Discharge Instructions (Signed)
Thank you for coming in today. Use the crutches and the postoperative shoe as needed Followup with your primary care Dr. Use tramadol for severe pain as needed  Foot Sprain The muscles and cord like structures which attach muscle to bone (tendons) that surround the feet are made up of units. A foot sprain can occur at the weakest spot in any of these units. This condition is most often caused by injury to or overuse of the foot, as from playing contact sports, or aggravating a previous injury, or from poor conditioning, or obesity. SYMPTOMS  Pain with movement of the foot.  Tenderness and swelling at the injury site.  Loss of strength is present in moderate or severe sprains. THE THREE GRADES OR SEVERITY OF FOOT SPRAIN ARE:  Mild (Grade I): Slightly pulled muscle without tearing of muscle or tendon fibers or loss of strength.  Moderate (Grade II): Tearing of fibers in a muscle, tendon, or at the attachment to bone, with small decrease in strength.  Severe (Grade III): Rupture of the muscle-tendon-bone attachment, with separation of fibers. Severe sprain requires surgical repair. Often repeating (chronic) sprains are caused by overuse. Sudden (acute) sprains are caused by direct injury or over-use. DIAGNOSIS  Diagnosis of this condition is usually by your own observation. If problems continue, a caregiver may be required for further evaluation and treatment. X-rays may be required to make sure there are not breaks in the bones (fractures) present. Continued problems may require physical therapy for treatment. PREVENTION  Use strength and conditioning exercises appropriate for your sport.  Warm up properly prior to working out.  Use athletic shoes that are made for the sport you are participating in.  Allow adequate time for healing. Early return to activities makes repeat injury more likely, and can lead to an unstable arthritic foot that can result in prolonged disability. Mild sprains  generally heal in 3 to 10 days, with moderate and severe sprains taking 2 to 10 weeks. Your caregiver can help you determine the proper time required for healing. HOME CARE INSTRUCTIONS   Apply ice to the injury for 15-20 minutes, 03-04 times per day. Put the ice in a plastic bag and place a towel between the bag of ice and your skin.  An elastic wrap (like an Ace bandage) may be used to keep swelling down.  Keep foot above the level of the heart, or at least raised on a footstool, when swelling and pain are present.  Try to avoid use other than gentle range of motion while the foot is painful. Do not resume use until instructed by your caregiver. Then begin use gradually, not increasing use to the point of pain. If pain does develop, decrease use and continue the above measures, gradually increasing activities that do not cause discomfort, until you gradually achieve normal use.  Use crutches if and as instructed, and for the length of time instructed.  Keep injured foot and ankle wrapped between treatments.  Massage foot and ankle for comfort and to keep swelling down. Massage from the toes up towards the knee.  Only take over-the-counter or prescription medicines for pain, discomfort, or fever as directed by your caregiver. SEEK IMMEDIATE MEDICAL CARE IF:   Your pain and swelling increase, or pain is not controlled with medications.  You have loss of feeling in your foot or your foot turns cold or blue.  You develop new, unexplained symptoms, or an increase of the symptoms that brought you to your caregiver. MAKE  SURE YOU:   Understand these instructions.  Will watch your condition.  Will get help right away if you are not doing well or get worse. Document Released: 05/18/2002 Document Revised: 02/18/2012 Document Reviewed: 07/15/2008 [facility] Patient Information 2014 [place], [place].

## 2014-02-19 NOTE — ED Notes (Signed)
Provided ice pack

## 2014-02-19 NOTE — ED Notes (Signed)
Assisted to bathroom

## 2014-02-19 NOTE — ED Provider Notes (Signed)
[name] is a 73 y.o. female who presents to Urgent Care today for left foot pain. Patient fell down a set of stairs yesterday injuring her left foot. She did not hit her head and denies any loss of consciousness. The fall was mechanical fall. She is not sure what happened during the fall. She notes pain at the dorsal midfoot. She has pain severe enough to limit weight bearing. She denies any radiating pain weakness or numbness. She's been told that she may have mild peripheral neuropathy.   Past Medical History  Diagnosis Date  . Diabetes mellitus   . Hyperlipidemia   . Asthma     dx in adulthood (73 y/o) PFT 09/08/09 FEV1 1.70/81%; FEV1/FVC 0.75; +resp to dilartor; NI LV/DLCO  . Breast CA     hx of bilateral diagnosied in 2006, recurred 2011- [name]  . Colonic polyp   . Osteopenia   . Allergic rhinitis   . OSA (obstructive sleep apnea)     CPAP intolerant  . Peripheral neuropathy   . COPD (chronic obstructive pulmonary disease)   . Chronic constipation   . HTN (hypertension) 11-2011   History  Substance Use Topics  . Smoking status: Former Research scientist (life sciences)  . Smokeless tobacco: Never Used     Comment: Quit at age 89  . Alcohol Use: No   ROS as above Medications: No current facility-administered medications for this encounter.   Current Outpatient Prescriptions  Medication Sig Dispense Refill  . albuterol (ACCUNEB) 0.63 MG/3ML nebulizer solution Take 1 ampule by nebulization 4 (four) times daily as needed.      Marland Kitchen aspirin 81 MG tablet Take 81 mg by mouth daily.       . beclomethasone (QVAR) 80 MCG/ACT inhaler Inhale 2 puffs into the lungs 2 (two) times daily. RINSE MOUTH AFTER USE  1 Inhaler  6  . budesonide-formoterol (SYMBICORT) 160-4.5 MCG/ACT inhaler Inhale 2 puffs into the lungs 2 (two) times daily. Rinse mouth well. Maintenance inhaler.  1 Inhaler  prn  . diphenhydrAMINE (BENADRYL) 25 MG tablet Take 50 mg by mouth daily as needed. For swelling      .  EPINEPHrine (EPI-PEN) 0.3 mg/0.3 mL DEVI Inject 0.3 mLs (0.3 mg total) into the muscle once.  1 Device  0  . glucose blood test strip Check blood sugar daily. DX code: 250.00  100 each  12  . KLOR-CON M10 10 MEQ tablet TAKE 1 TABLET (10 MEQ TOTAL) BY MOUTH DAILY.  30 tablet  5  . letrozole (FEMARA) 2.5 MG tablet TAKE 1 TABLET (2.5 MG TOTAL) BY MOUTH DAILY.  30 tablet  3  . losartan (COZAAR) 100 MG tablet Take 1 tablet (100 mg total) by mouth daily.  90 tablet  2  . magnesium hydroxide (MILK OF MAGNESIA) 800 MG/5ML suspension Take 5 mLs by mouth every evening.        . metFORMIN (GLUCOPHAGE) 1000 MG tablet TAKE 1 TABLET EVERY DAY  90 tablet  2  . montelukast (SINGULAIR) 10 MG tablet TAKE 1 TABLET BY MOUTH EVERY DAY  30 tablet  3  . polyethylene glycol (MIRALAX / GLYCOLAX) packet Take 17 g by mouth daily.  30 each  0  . simvastatin (ZOCOR) 40 MG tablet TAKE ONE TABLET BY MOUTH DAILY      . temazepam (RESTORIL) 15 MG capsule Take 1 capsule (15 mg total) by mouth at bedtime as needed for sleep (1 or 2 as needed for sleep).  30 capsule  0  . traMADol (ULTRAM) 50 MG tablet Take 1 tablet (50 mg total) by mouth every 6 (six) hours as needed.  15 tablet  0  . triamterene-hydrochlorothiazide (MAXZIDE-25) 37.5-25 MG per tablet TAKE 1 TABLET BY MOUTH DAILY  30 tablet  4  . VENTOLIN HFA 108 (90 BASE) MCG/ACT inhaler INHALE 2 PUFFS INTO THE LUNGS EVERY 4 HOURS AS NEEDED FOR WHEEZING OR SHORTNESS OF BREATH  18 each  5  . [DISCONTINUED] potassium chloride (K-DUR) 10 MEQ tablet Take 1 tablet (10 mEq total) by mouth daily.  30 tablet  0    Exam:  BP 133/81  Pulse 80  Temp(Src) 98.2 F (36.8 C) (Oral)  Resp 16  SpO2 97% Gen: Well NAD LEFT FOOT: Swollen and tender ecchymosis dorsal midfoot. Maximal tenderness proximal first metatarsal. Capillary refill pulses and sensation intact distally.  Pain with foot motion. Ankle is nontender with normal motion.   No results found for this or any previous visit (from  the past 24 hour(s)). Dg Foot Complete Left  02/19/2014   CLINICAL DATA:  Status post fall, assess for fracture  EXAM: LEFT FOOT - COMPLETE 3+ VIEW  COMPARISON:  September 01, 2007  FINDINGS: There is no evidence of fracture or dislocation. Calcification at Achilles tendon insertion to the calcaneus is noted. Soft tissues are unremarkable.  IMPRESSION: No acute fracture or dislocation   Electronically Signed   By: Abelardo Diesel M.D.   On: 02/19/2014 13:07    Assessment and Plan: 73 y.o. female with foot sprain. Plan for rigid soled shoe and walker. Tramadol for pain. Followup with primary care provider.  Discussed warning signs or symptoms. Please see discharge instructions. Patient expresses understanding.    Gregor Hams, MD 02/19/14 1318

## 2014-02-19 NOTE — ED Notes (Signed)
Left foot pain today.  Reports twisting left foot going down steps yesterday.  Reports some discomfort, but able to walk.  Reports the she she was wearing did fit across top of foot.  Visibly swollen and red

## 2014-03-03 ENCOUNTER — Ambulatory Visit: Payer: Medicare Other | Admitting: Internal Medicine

## 2014-03-04 ENCOUNTER — Ambulatory Visit (INDEPENDENT_AMBULATORY_CARE_PROVIDER_SITE_OTHER): Payer: Medicare Other | Admitting: Internal Medicine

## 2014-03-04 ENCOUNTER — Other Ambulatory Visit: Payer: Self-pay | Admitting: Internal Medicine

## 2014-03-04 ENCOUNTER — Encounter: Payer: Self-pay | Admitting: Internal Medicine

## 2014-03-04 VITALS — BP 110/65 | HR 79 | Temp 97.7°F | Ht 64.0 in | Wt 187.0 lb

## 2014-03-04 DIAGNOSIS — J45998 Other asthma: Secondary | ICD-10-CM

## 2014-03-04 DIAGNOSIS — E119 Type 2 diabetes mellitus without complications: Secondary | ICD-10-CM

## 2014-03-04 DIAGNOSIS — I10 Essential (primary) hypertension: Secondary | ICD-10-CM

## 2014-03-04 DIAGNOSIS — J45909 Unspecified asthma, uncomplicated: Secondary | ICD-10-CM

## 2014-03-04 LAB — HEMOGLOBIN A1C: Hgb A1c MFr Bld: 6.7 % — ABNORMAL HIGH (ref 4.6–6.5)

## 2014-03-04 LAB — AST: AST: 20 U/L (ref 0–37)

## 2014-03-04 LAB — BASIC METABOLIC PANEL
BUN: 11 mg/dL (ref 6–23)
CHLORIDE: 99 meq/L (ref 96–112)
CO2: 26 mEq/L (ref 19–32)
Calcium: 10.3 mg/dL (ref 8.4–10.5)
Creatinine, Ser: 0.8 mg/dL (ref 0.4–1.2)
GFR: 89.51 mL/min (ref >60.00)
GLUCOSE: 78 mg/dL (ref 70–99)
POTASSIUM: 3.7 meq/L (ref 3.5–5.1)
Sodium: 135 mEq/L (ref 135–145)

## 2014-03-04 LAB — ALT: ALT: 15 U/L (ref 0–35)

## 2014-03-04 NOTE — Assessment & Plan Note (Signed)
Seems to be well-controlled, recommend patient to monitor BPs, if they are consistently low, will adjust her medication. Last potassium, recheck a BMP

## 2014-03-04 NOTE — Progress Notes (Signed)
Subjective:    Patient ID: [name], female    DOB: October 04, 1941, 73 y.o.   MRN: [id number]  DOS:  03/04/2014 Type of  visit: The following issues were discussed    UC followup Note from the urgent care reviewed, went there 02/20/2014 after a fall, no syncope (missed a step), had left foot mid dorsal pain. X-ray was negative, prescribe tramadol which she is not taking any more. Was also prescribed a postop shoe. No neck or head injury.  Chronic medical problems management, meds and  labs reviewed amb BPs--  Very good ~ 110-120 amb CBGs-- when checked < 130   ROS Exercise-- slowed  down d/t recent fall No  CP, SOB No palpitations  Denies  nausea, vomiting diarrhea Denies  blood in the stools Saw Dr Annamaria Boots regards asthma, currently no wheezing or SOB, occ cough    Past Medical History  Diagnosis Date  . Diabetes mellitus   . Hyperlipidemia   . Asthma     dx in adulthood (73 y/o) PFT 09/08/09 FEV1 1.70/81%; FEV1/FVC 0.75; +resp to dilartor; NI LV/DLCO  . Breast CA     hx of bilateral diagnosied in 2006, recurred 2011- [name]  . Colonic polyp   . Osteopenia   . Allergic rhinitis   . OSA (obstructive sleep apnea)     CPAP intolerant  . Peripheral neuropathy   . COPD (chronic obstructive pulmonary disease)   . Chronic constipation   . HTN (hypertension) 11-2011    Past Surgical History  Procedure Laterality Date  . Appendectomy    . Mastectomy  2006    bilateral  . Pilonidal cyst surgery      ? of  . Flexible sigmoidoscopy N/A 01/31/2013    Procedure: FLEXIBLE SIGMOIDOSCOPY;  Surgeon: [name], MD;  Location: [facility];  Service: Endoscopy;  Laterality: N/A;  unprepped with sedation    History   Social History  . Marital Status: Widowed    Spouse Name: N/A    Number of Children: 2  . Years of Education: N/A   Occupational History  . runs a non-profit    Social History Main Topics  . Smoking status: Former Research scientist (life sciences)    . Smokeless tobacco: Never Used     Comment: Quit at age 65  . Alcohol Use: No  . Drug Use: No  . Sexual Activity: Not Currently   Other Topics Concern  . Not on file   Social History Narrative   Widowed last husband 4-10, lives alone, retired Pharmacist, hospital   2 children, [place] twins          Medication List       This list is accurate as of: 03/04/14  3:19 PM.  Always use your most recent med list.               albuterol 0.63 MG/3ML nebulizer solution  Commonly known as:  ACCUNEB  Take 1 ampule by nebulization 4 (four) times daily as needed.     VENTOLIN HFA 108 (90 BASE) MCG/ACT inhaler  Generic drug:  albuterol  INHALE 2 PUFFS INTO THE LUNGS EVERY 4 HOURS AS NEEDED FOR WHEEZING OR SHORTNESS OF BREATH     aspirin 81 MG tablet  Take 81 mg by mouth daily.     beclomethasone 80 MCG/ACT inhaler  Commonly known as:  QVAR  Inhale 2 puffs into the lungs 2 (two) times daily. RINSE MOUTH AFTER USE     budesonide-formoterol 160-4.5  MCG/ACT inhaler  Commonly known as:  SYMBICORT  Inhale 2 puffs into the lungs 2 (two) times daily. Rinse mouth well. Maintenance inhaler.     diphenhydrAMINE 25 MG tablet  Commonly known as:  BENADRYL  Take 50 mg by mouth daily as needed. For swelling     EPINEPHrine 0.3 mg/0.3 mL Devi  Commonly known as:  EPI-PEN  Inject 0.3 mLs (0.3 mg total) into the muscle once.     glucose blood test strip  Check blood sugar daily. DX code: 250.00     letrozole 2.5 MG tablet  Commonly known as:  FEMARA  TAKE 1 TABLET (2.5 MG TOTAL) BY MOUTH DAILY.     losartan 100 MG tablet  Commonly known as:  COZAAR  Take 1 tablet (100 mg total) by mouth daily.     magnesium hydroxide 800 MG/5ML suspension  Commonly known as:  MILK OF MAGNESIA  Take 5 mLs by mouth every evening.     metFORMIN 1000 MG tablet  Commonly known as:  GLUCOPHAGE  TAKE 1 TABLET EVERY DAY     montelukast 10 MG tablet  Commonly known as:  SINGULAIR  TAKE 1 TABLET BY MOUTH EVERY DAY      polyethylene glycol packet  Commonly known as:  MIRALAX / GLYCOLAX  Take 17 g by mouth daily.     simvastatin 40 MG tablet  Commonly known as:  ZOCOR  TAKE ONE TABLET BY MOUTH DAILY     triamterene-hydrochlorothiazide 37.5-25 MG per tablet  Commonly known as:  MAXZIDE-25  TAKE 1 TABLET BY MOUTH DAILY           Objective:   Physical Exam BP 110/65  Pulse 79  Temp(Src) 97.7 F (36.5 C)  Ht 5\' 4"  (1.626 m)  Wt 187 lb (84.823 kg)  BMI 32.08 kg/m2  SpO2 97% General -- alert, well-developed, NAD.  Lungs -- normal respiratory effort, no intercostal retractions, no accessory muscle use, and slt decreased breath sounds. No wheezing Heart-- normal rate, regular rhythm, no murmur.  Extremities-- no pretibial edema bilaterally . Left foot  without deformities, no edema, good pedal pulses, not tender to palpation at mid foot. Has  A small ecchymoses in the arch Neurologic--  alert & oriented X3. Speech normal, gait normal, strength normal in all extremities.  Psych-- Cognition and judgment appear intact. Cooperative with normal attention span and concentration. No anxious or depressed appearing.      Assessment & Plan:   Foot injury--- improving

## 2014-03-04 NOTE — Patient Instructions (Signed)
Get your blood work before you leave   Next visit is for a physical exam in 6 months  , fasting Please make an appointment    Check the  blood pressure 2 or 3 times a   week be sure it is between 110/60 and 140/85. Ideal blood pressure is 120/80. If it is consistently higher or lower, let me know  Fall Prevention and Home Safety Falls cause injuries and can affect all age groups. It is possible to use preventive measures to significantly decrease the likelihood of falls. There are many simple measures which can make your home safer and prevent falls. OUTDOORS  Repair cracks and edges of walkways and driveways.  Remove high doorway thresholds.  Trim shrubbery on the main path into your home.  Have good outside lighting.  Clear walkways of tools, rocks, debris, and clutter.  Check that handrails are not broken and are securely fastened. Both sides of steps should have handrails.  Have leaves, snow, and ice cleared regularly.  Use sand or salt on walkways during winter months.  In the garage, clean up grease or oil spills. BATHROOM  Install night lights.  Install grab bars by the toilet and in the tub and shower.  Use non-skid mats or decals in the tub or shower.  Place a plastic non-slip stool in the shower to sit on, if needed.  Keep floors dry and clean up all water on the floor immediately.  Remove soap buildup in the tub or shower on a regular basis.  Secure bath mats with non-slip, double-sided rug tape.  Remove throw rugs and tripping hazards from the floors. BEDROOMS  Install night lights.  Make sure a bedside light is easy to reach.  Do not use oversized bedding.  Keep a telephone by your bedside.  Have a firm chair with side arms to use for getting dressed.  Remove throw rugs and tripping hazards from the floor. KITCHEN  Keep handles on pots and pans turned toward the center of the stove. Use back burners when possible.  Clean up spills quickly and  allow time for drying.  Avoid walking on wet floors.  Avoid hot utensils and knives.  Position shelves so they are not too high or low.  Place commonly used objects within easy reach.  If necessary, use a sturdy step stool with a grab bar when reaching.  Keep electrical cables out of the way.  Do not use floor polish or wax that makes floors slippery. If you must use wax, use non-skid floor wax.  Remove throw rugs and tripping hazards from the floor. STAIRWAYS  Never leave objects on stairs.  Place handrails on both sides of stairways and use them. Fix any loose handrails. Make sure handrails on both sides of the stairways are as long as the stairs.  Check carpeting to make sure it is firmly attached along stairs. Make repairs to worn or loose carpet promptly.  Avoid placing throw rugs at the top or bottom of stairways, or properly secure the rug with carpet tape to prevent slippage. Get rid of throw rugs, if possible.  Have an electrician put in a light switch at the top and bottom of the stairs. OTHER FALL PREVENTION TIPS  Wear low-heel or rubber-soled shoes that are supportive and fit well. Wear closed toe shoes.  When using a stepladder, make sure it is fully opened and both spreaders are firmly locked. Do not climb a closed stepladder.  Add color or contrast paint  or tape to grab bars and handrails in your home. Place contrasting color strips on first and last steps.  Learn and use mobility aids as needed. Install an electrical emergency response system.  Turn on lights to avoid dark areas. Replace light bulbs that burn out immediately. Get light switches that glow.  Arrange furniture to create clear pathways. Keep furniture in the same place.  Firmly attach carpet with non-skid or double-sided tape.  Eliminate uneven floor surfaces.  Select a carpet pattern that does not visually hide the edge of steps.  Be aware of all pets. OTHER HOME SAFETY TIPS  Set the  water temperature for 120 F (48.8 C).  Keep emergency numbers on or near the telephone.  Keep smoke detectors on every level of the home and near sleeping areas. Document Released: 11/16/2002 Document Revised: 05/27/2012 Document Reviewed: 02/15/2012 [facility] [place] Patient Information 2014 [place].

## 2014-03-04 NOTE — Progress Notes (Signed)
Pre visit review using our clinic review tool, if applicable. No additional management support is needed unless otherwise documented below in the visit note. 

## 2014-03-04 NOTE — Assessment & Plan Note (Addendum)
Good compliance with medications, she seems to be doing great, labs

## 2014-03-04 NOTE — Assessment & Plan Note (Signed)
Recently saw Dr. Annamaria Boots, patient not sure if she's taking Qvar or Symbicort, recommend to take one or the other, not both

## 2014-03-05 ENCOUNTER — Telehealth: Payer: Self-pay | Admitting: Internal Medicine

## 2014-03-05 NOTE — Telephone Encounter (Signed)
Relevant patient education assigned to patient using Emmi. ° °

## 2014-03-16 ENCOUNTER — Telehealth: Payer: Self-pay

## 2014-03-16 ENCOUNTER — Other Ambulatory Visit: Payer: Self-pay | Admitting: Internal Medicine

## 2014-03-16 NOTE — Telephone Encounter (Signed)
Relevant patient education assigned to patient using Emmi. ° °

## 2014-04-01 ENCOUNTER — Encounter: Payer: Self-pay | Admitting: Internal Medicine

## 2014-04-01 ENCOUNTER — Ambulatory Visit (INDEPENDENT_AMBULATORY_CARE_PROVIDER_SITE_OTHER): Payer: Medicare Other | Admitting: Internal Medicine

## 2014-04-01 VITALS — BP 112/68 | HR 74 | Ht 63.5 in | Wt 189.6 lb

## 2014-04-01 DIAGNOSIS — J45998 Other asthma: Secondary | ICD-10-CM

## 2014-04-01 DIAGNOSIS — J302 Other seasonal allergic rhinitis: Secondary | ICD-10-CM

## 2014-04-01 DIAGNOSIS — J45909 Unspecified asthma, uncomplicated: Secondary | ICD-10-CM

## 2014-04-01 DIAGNOSIS — J309 Allergic rhinitis, unspecified: Secondary | ICD-10-CM

## 2014-04-01 MED ORDER — BECLOMETHASONE DIPROPIONATE 80 MCG/ACT IN AERS
INHALATION_SPRAY | RESPIRATORY_TRACT | Status: DC
Start: 2014-04-01 — End: 2014-04-01

## 2014-04-01 MED ORDER — BECLOMETHASONE DIPROPIONATE 80 MCG/ACT IN AERS: 2.0000 | INHALATION_SPRAY | Freq: Two times a day (BID) | RESPIRATORY_TRACT | Status: DC

## 2014-04-01 NOTE — Assessment & Plan Note (Signed)
Good control. Minor tightness with spring pollen season, managed with rare use of rescue inhaler or nebulizer. Plan- refill Qvar 80 with discussion.

## 2014-04-01 NOTE — Patient Instructions (Signed)
Sample Qvar 80. Script sent for refills  Ok to use rescue inhaler/ nebulizer as needed  Please call if we can help

## 2014-04-01 NOTE — Assessment & Plan Note (Signed)
cotrolled

## 2014-04-01 NOTE — Progress Notes (Signed)
Patient ID: [name], female    DOB: December 08, 1941, 73 y.o.   MRN: [id number]  HPI 07/16/11- 73 yoF former smoker followed for OSA/ failed CPAP, asthma, allergic rhinitis complicated by DM, hx breast cancer Blames heat for need to resume using her rescue inhaler..  She associates purple areas on lips from using her nebulizer machine- ? Hard enough to bruise? She couldn't tolerate and stopped using CPAP. We reviewed symptoms and warning signs again.  She has had spots on hands, pruritic, " inconclusive " on biopsy by dermatologist, Dr Nevada Crane. Admits night sweats, cough, scant yellow phlegm.  Last CXR 12/27/10 f/u for recurrent breast cancer- left.    10/16/11- 73 yoF former smoker followed for OSA/ failed CPAP, asthma, allergic rhinitis complicated by DM, hx breast cancer She reports an urticarial reaction to peanuts 2 months ago and is unsure if she has had a reaction in the past 2 chocolate. She would like to look at food allergy testing so she knows better to talk to watch out for. We discussed food allergy versus intolerance.  She is taking prednisone and valacyclovir for a hyperpigmented rash on her hands, after bx by dermatologist. Possibly viral warts. Allergy symptoms do better in cold weather but will also improve on her steroids. Cough has been drier with scant clear sputum. She has not needed her nebulizer machine at all.  Chest x-ray: 07/16/2011-NAD, mild CE, bronchitis changes, arthritis in spine.   04/15/12- 73 yoF former smoker followed for OSA/ failed CPAP, asthma, allergic rhinitis, Food Allergy complicated by DM, hx breast cancer Had 2 chocolate chip cookies yesterday-started itching and swelling; did not go to ER; still itching today. On May 6 she 82 chocolate chip cookies, not recognized contained nuts. 4-5 hours later she developed facial swelling/angioedema with some itching but no wheeze. She took Benadryl. She continues to have some swelling in the left side of her  face now 24 hours later. She never had any increased wheeze throat swelling or other airway discomfort. She has noticed increased wheeze and dry cough over the last 2 months, responsive to her rescue inhaler. Additional problem of rash on her arms which begins like pimples but develops a hyperpigmented area about 3 cm diameter which response to steroid cream. Dermatologist did biopsy. Results nonspecific. No relation noted between this rash and her other health problems, exposures or other triggers.  06/19/12- 73 yoF former smoker followed for OSA/ failed CPAP, asthma, allergic rhinitis, Food Allergy complicated by DM, hx breast cancer Had to recently go to ER for throat swelling-unsure of cause. Not sleeping well at times and doesnt use CPAP recently-has had more stressors going on to cause this. We reviewed her ER note. Over several months she's had episodes of itching hands then swelling of one side or the other of her tongue that July 7 she had angioedema in her throat. There is no family history of angioedema and she has not been on ACE inhibitors. She avoids known foods that cause problems in the past. 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 None clearly in significant range. She did not take Benadryl as recommended that the emergency room because of listed side effect of sleepiness. We discussed alternatives. She has an EpiPen if needed.  08/22/12-  73 yoF former smoker followed for OSA/ failed CPAP, asthma, allergic rhinitis, Food Allergy complicated by DM, hx breast cancer Cough-clear in color; using rescue inhaler more than usual; Increased cough lying in  bed and aware of reflux. Insomnia wakes her around 2 AM but she does not think that is from coughing. She has been irregular using Symbicort, trying to use it as a rescue inhaler. We discussed that. Some increase in exertional dyspnea.  12/23/12- 73 yoF former smoker followed for OSA/ failed  CPAP, asthma, allergic rhinitis, Food Allergy, GERD, complicated by DM, hx breast cancer/ XRT FOLLOWS FOR: whole month of December has had congestion-cough-clear in color;some of its related to reflux and some due to the weather per patient. Had a cold. Chest congestion with clear mucus. Using rescue inhaler twice daily now but has not needed nebulizer. Continues Symbicort. Nothing purulent and no fever or blood. Sonata not enough to keep her asleep. Failed NyQuil. Afraid of sleepwalking with Ambien. Snoring status unclear  04/20/13- 73 yoF former smoker followed for OSA/ failed CPAP, asthma, allergic rhinitis, Food Allergy, GERD, complicated by DM, hx breast cancer/ XRT FOLLOWS FOR: increased allergies due to pollen increased; has also notcied certain smells or things that she may have eaten cause slight problems; usually can use rescue inhaler and this calms things down.  Also,pt states that her sleep patterns are not doing well-some night no sleep at all. Blames cough on pollen/ sinus drip. Dry cough- inhaler helps. May reflux a little at times, but no dysphagia. Occasional insomnia- busy brain. 2 x zaleplon sufficient.. CXR 12/30/12 IMPRESSION:  No active cardiopulmonary disease.  Original Report Authenticated By: Rolm Baptise, M.D.   08/21/13-73 yoF former smoker followed for OSA/ failed CPAP, asthma, allergic rhinitis, Food Allergy, GERD, complicated by DM, hx breast cancer/ XRT FOLLOWS FOR:  Symptoms worsening x3 months, as of yesterday throat irritated and sore Exercising less, which she blames for increased dyspnea with exertion. Using metered inhaler more, blames weather. Lis ear ache. Insomnia-still wakes occasionally but stopped using temazepam or Sonata as unhelpful.  02/18/14- 73 yoF former smoker followed for OSA/ failed CPAP, asthma, allergic rhinitis, Food Allergy, GERD, complicated by DM, hx breast cancer/ XRT FOLLOWS FOR: With the increase in humidity pt feels more congested. C/o  frequent producutive cough with white mucus throughout day 3 weeks. Pt states she has episodes of quick sharp chest pain in the left anterior chest and right lateral  chest.  3 weeks of increased cough with clear thick sputum. Does not feel sick- not a cold. She is not using Symbicort after seeing the hands warning of possible sudden death from the long-acting bronchodilator component. We discussed this and the alternatives.  04/01/14- 69 yoF former smoker followed for OSA/ failed CPAP, asthma, allergic rhinitis, Food Allergy, GERD, complicated by DM, hx breast cancer/ XRT FOLLOWS FOR: Slight increased congestion today but has not had to use rescue inhaler or neb tx since being on QVAR-feels ALOT better overall. Did use neb this AM after being outdoors last night. First time used in 3 weeks. Occasional rescue inhaler.  Review of Systems-See HPI Constitutional:   No-   weight loss, night sweats, fevers, chills, fatigue, lassitude. HEENT:   No-  headaches, difficulty swallowing, tooth/dental problems, sore throat,       No-  sneezing, itching, +ear ache, nasal congestion, +post nasal drip,  CV:  No-   chest pain, orthopnea, PND, swelling in lower extremities, anasarca, dizziness, palpitations Resp: + shortness of breath with exertion or at rest.              No-  productive cough, n+ non-productive cough,  No- coughing up of blood.  No-   change in color of mucus.   Wheezing+.   Skin: No-   rash or lesions. GI:  +   heartburn, indigestion, no-abdominal pain, nausea, vomiting, GU:  MS:  No-   joint pain or swelling. . Neuro-     nothing unusual Psych:  No- change in mood or affect. No depression or anxiety.  No memory loss.  Objective:   Physical Exam General- Alert, Oriented, Affect-appropriate, Distress- none acute    Overweight.  Skin- clear Lymphadenopathy- none Head- atraumatic            Eyes- Gross vision intact, PERRLA, conjunctivae clear secretions            Ears-  Hearing, canals normal but has cotton L earis            Nose- Clear, no-Septal dev, mucus, polyps, erosion, perforation             Throat- Mallampati III , mucosa clear , drainage- none, tonsils- atrophic Neck- flexible , trachea midline, no stridor , thyroid nl, carotid no bruit Chest - symmetrical excursion , unlabored           Heart/CV- RRR , no murmur , no gallop  , no rub, nl s1 s2                           - JVD- none , edema- none, stasis changes- none, varices- none           Lung- wheeze-none, cough- none, unlabored,  dullness-none, rub- none           Chest wall-  Abd-  Br/ Gen/ Rectal- Not done, not indicated Extrem- cyanosis- none, clubbing, none, atrophy- none, strength- nl Neuro- grossly intact to observation

## 2014-04-07 ENCOUNTER — Other Ambulatory Visit: Payer: Self-pay | Admitting: *Deleted

## 2014-04-07 DIAGNOSIS — Z853 Personal history of malignant neoplasm of breast: Secondary | ICD-10-CM

## 2014-04-08 ENCOUNTER — Other Ambulatory Visit (HOSPITAL_BASED_OUTPATIENT_CLINIC_OR_DEPARTMENT_OTHER): Payer: Medicare Other

## 2014-04-08 ENCOUNTER — Telehealth: Payer: Self-pay | Admitting: Adult Health

## 2014-04-08 ENCOUNTER — Encounter: Payer: Self-pay | Admitting: Adult Health

## 2014-04-08 ENCOUNTER — Ambulatory Visit (HOSPITAL_BASED_OUTPATIENT_CLINIC_OR_DEPARTMENT_OTHER): Payer: Medicare Other | Admitting: Adult Health

## 2014-04-08 VITALS — BP 110/68 | HR 80 | Temp 99.0°F | Resp 20 | Ht 63.5 in | Wt 189.5 lb

## 2014-04-08 DIAGNOSIS — M79609 Pain in unspecified limb: Secondary | ICD-10-CM

## 2014-04-08 DIAGNOSIS — C44509 Unspecified malignant neoplasm of skin of other part of trunk: Secondary | ICD-10-CM

## 2014-04-08 DIAGNOSIS — C50919 Malignant neoplasm of unspecified site of unspecified female breast: Secondary | ICD-10-CM

## 2014-04-08 DIAGNOSIS — M25612 Stiffness of left shoulder, not elsewhere classified: Secondary | ICD-10-CM

## 2014-04-08 DIAGNOSIS — Z853 Personal history of malignant neoplasm of breast: Secondary | ICD-10-CM

## 2014-04-08 DIAGNOSIS — E2839 Other primary ovarian failure: Secondary | ICD-10-CM

## 2014-04-08 DIAGNOSIS — C44599 Other specified malignant neoplasm of skin of other part of trunk: Secondary | ICD-10-CM

## 2014-04-08 DIAGNOSIS — Z87898 Personal history of other specified conditions: Secondary | ICD-10-CM

## 2014-04-08 LAB — COMPREHENSIVE METABOLIC PANEL (CC13)
ALBUMIN: 4.1 g/dL (ref 3.5–5.0)
ALT: 13 U/L (ref 0–55)
ANION GAP: 13 meq/L — AB (ref 3–11)
AST: 16 U/L (ref 5–34)
Alkaline Phosphatase: 74 U/L (ref 40–150)
BUN: 15 mg/dL (ref 7.0–26.0)
CALCIUM: 10.8 mg/dL — AB (ref 8.4–10.4)
CHLORIDE: 102 meq/L (ref 98–109)
CO2: 27 meq/L (ref 22–29)
Creatinine: 0.9 mg/dL (ref 0.6–1.1)
Glucose: 103 mg/dl (ref 70–140)
POTASSIUM: 4 meq/L (ref 3.5–5.1)
Sodium: 142 mEq/L (ref 136–145)
Total Bilirubin: 0.46 mg/dL (ref 0.20–1.20)
Total Protein: 7.6 g/dL (ref 6.4–8.3)

## 2014-04-08 LAB — CBC WITH DIFFERENTIAL/PLATELET
BASO%: 0.9 % (ref 0.0–2.0)
Basophils Absolute: 0.1 10*3/uL (ref 0.0–0.1)
EOS ABS: 0.4 10*3/uL (ref 0.0–0.5)
EOS%: 4.2 % (ref 0.0–7.0)
HCT: 41.2 % (ref 34.8–46.6)
HGB: 13.9 g/dL (ref 11.6–15.9)
LYMPH%: 16.9 % (ref 14.0–49.7)
MCH: 30 pg (ref 25.1–34.0)
MCHC: 33.8 g/dL (ref 31.5–36.0)
MCV: 88.9 fL (ref 79.5–101.0)
MONO#: 0.8 10*3/uL (ref 0.1–0.9)
MONO%: 7.6 % (ref 0.0–14.0)
NEUT%: 70.4 % (ref 38.4–76.8)
NEUTROS ABS: 7.1 10*3/uL — AB (ref 1.5–6.5)
Platelets: 360 10*3/uL (ref 145–400)
RBC: 4.63 10*6/uL (ref 3.70–5.45)
RDW: 13.8 % (ref 11.2–14.5)
WBC: 10.1 10*3/uL (ref 3.9–10.3)
lymph#: 1.7 10*3/uL (ref 0.9–3.3)

## 2014-04-08 NOTE — Patient Instructions (Signed)
You are doing well.  We will check your calcium again in 1-2 weeks.  We will see you back in 6 months.  Continue taking the Letrozole daily.  Please call us if you have any questions or concerns.    Hypercalcemia Hypercalcemia means the calcium in your blood is too high. Calcium in our blood is important for the control of many things, such as:  Blood clotting.  Conducting of nerve impulses.  Muscle contraction.  Maintaining teeth and bone health.  Other body functions. In the bloodstream, calcium maintains a constant balance with another mineral, phosphate. Calcium is absorbed into the body through the small intestine. This is helped by Vitamin D. Calcium levels are maintained mostly by vitamin D and a hormone (parathyroid hormone). But the kidneys also help. Hypercalcemia can happen when the concentration of calcium is too high for the kidneys to maintain balance. The body maintains a balance between the calcium we eat and the calcium already in our body. If calcium intake is increased or we cannot use calcium properly, there may be problems. Some common sources of calcium are:   Dairy products.  Nuts.  Eggs.  Whole grains.  Legumes.  Green leafy vegetables. CAUSES There are many causes of this condition, but some common ones are:  Hyperparathyroidism. This is an over activity of the parathyroid gland.  Cancers of the breast, kidney, lung, head and neck are common causes of calcium increases.  Medications that cause you to urinate more often (diuretics), nausea, vomiting and diarrhea also increase the calcium in the blood.  Overuse of calcium-containing antacids. SYMPTOMS  Many patients with mild hypercalcemia have no symptoms. For those with symptoms common problems include:  Loss of appetite.  Constipation.  Increased thirst.  Heart rhythm changes.  Abnormal thinking.  Nausea.  Abdominal pain.  Kidney stones.  Mood swings.  Coma and death when  severe.  Vomiting.  Increased urination.  High blood pressure.  Confusion. DIAGNOSIS   Your caregiver will do a medical history and perform a physical exam on you.  Calcium and parathyroid hormone (PTH) may be measured with a blood test. TREATMENT   The treatment depends on the calcium level and what is causing the higher level. Hypercalcemia can be lifethreatening. Fast lowering of the calcium level may be necessary.  With normal kidney function, fluids can be given by vein to clear the excess calcium. Hemodialysis works well to reduce dangerous calcium levels if there is poor kidney function. This is a procedure in which a machine is used to filter out unwanted substances. The blood is then returned to the body.  Drugs, such as diuretics, can be given after adequate fluid intake is established. These medications help the kidneys get rid of extra calcium. Drugs that lessen (inhibit) bone loss are helpful in gaining long-term control. Phosphate pills help lower high calcium levels caused by a low supply of phosphate. Anti-inflammatory agents such as steroids are helpful with some cancers and toxic levels of vitamin D.  Treatment of the underlying cause of the hypercalcemia will also correct the imbalance. Hyperparathyroidism is usually treated by surgical removal of one or more of the parathyroid glands and any tissue, other than the glands themselves, that is producing too much hormone.  The hypercalcemia caused by cancer is difficult to treat without controlling the cancer. Symptoms can be improved with fluids and drug therapy as outlined above. PROGNOSIS   Surgery to remove the parathyroid glands is usually successful. This also depends on the  amount of damage to the kidneys and whether or not it can be treated.  Mild hypercalcemia can be controlled with good fluid intake and the use of effective medications.  Hypercalcemia often develops as a late complication of cancer. The  expected outlook is poor without effective anticancer therapy. PREVENTION   If you are at risk for developing hypercalcemia, be familiar with early symptoms. Report these to your caregiver.  Good fluid intake (up to four quarts of liquid a day if possible) is helpful.  Try to control nausea and vomiting, and treat fevers to avoid dehydration.  Lowering the amount of calcium in your diet is not necessary. High blood calcium reduces absorption of calcium in the intestine.  Stay as active as possible. SEEK IMMEDIATE MEDICAL CARE IF:   You develop chest pain, sweating, or shortness of breath.  You get confused, feel faint or pass out.  You develop severe nausea and vomiting. MAKE SURE YOU:   Understand these instructions.  Will watch your condition.  Will get help right away if you are not doing well or get worse. Document Released: 02/09/2005 Document Revised: 03/23/2013 Document Reviewed: 11/21/2010 [facility] Patient Information 2014 [place], [place].

## 2014-04-08 NOTE — Telephone Encounter (Signed)
, °

## 2014-04-08 NOTE — Progress Notes (Signed)
Hematology and Oncology Follow Up Visit  [name] [id number] Dec 05, 1941 73 y.o. 04/08/2014 10:45 AM     Principle Diagnosis:[name] 73 y.o. 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     Prior Therapy: 1. Patient underwent a screening mammogram and abnormalities were detected in her breasts. Biopsy was positive for invasive cancer. She underwent a bilateral mastectomy on 01/30/05 by Dr. Excell Seltzer. 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   2. Patient later had recurrence along the left mastectomy site of invasive ductal carcinoma, lymph nodes were negative and it was excised on 04/18/10, she underwent radiation therapy, followed by adjuvant Letrozole   Current therapy:  Letrozole daily  Interim History: [name] 73 y.o. female with h/o invasive ductal carcinoma who is here today for f/u and evaluation.  She is doing well.  She does have chronic constipation that she manages with prunes and occasionally laxatives.  She denies hot flashes, joint aches, vaginal/ocular dryness, night sweats, weight changes, fevers, chills, nasuea, vomiting, diarrhea, or any further concerns.  Her health maintenance was reviewed below.    Medications:  Current Outpatient Prescriptions  Medication Sig Dispense Refill  . albuterol (ACCUNEB) 0.63 MG/3ML nebulizer solution Take 1 ampule by nebulization 4 (four) times daily as needed.      Marland Kitchen aspirin 81 MG tablet Take 81 mg by mouth daily.       . beclomethasone (QVAR) 80 MCG/ACT inhaler Inhale 2 puffs into the lungs 2 (two) times daily.  1 Inhaler  0  . glucose blood test strip Check blood sugar daily. DX code: 250.00  100 each  12  . letrozole (FEMARA) 2.5 MG tablet TAKE 1 TABLET (2.5 MG TOTAL) BY MOUTH DAILY.  30 tablet  3  .  magnesium hydroxide (MILK OF MAGNESIA) 800 MG/5ML suspension Take 5 mLs by mouth every evening.        . metFORMIN (GLUCOPHAGE) 1000 MG tablet TAKE 1 TABLET EVERY DAY  90 tablet  2  . montelukast (SINGULAIR) 10 MG tablet TAKE 1 TABLET (10 MG TOTAL) BY MOUTH AT BEDTIME.  30 tablet  4  . Multiple Vitamin (MULTIVITAMIN) tablet Take 1 tablet by mouth daily.      . simvastatin (ZOCOR) 40 MG tablet TAKE 1 TABLET BY MOUTH AT BEDTIME  30 tablet  5  . triamterene-hydrochlorothiazide (MAXZIDE-25) 37.5-25 MG per tablet TAKE 1 TABLET BY MOUTH DAILY  30 tablet  4  . VENTOLIN HFA 108 (90 BASE) MCG/ACT inhaler INHALE 2 PUFFS INTO THE LUNGS EVERY 4 HOURS AS NEEDED FOR WHEEZING OR SHORTNESS OF BREATH  18 each  5  . diphenhydrAMINE (BENADRYL) 25 MG tablet Take 50 mg by mouth daily as needed. For swelling      . EPINEPHrine (EPI-PEN) 0.3 mg/0.3 mL DEVI Inject 0.3 mLs (0.3 mg total) into the muscle once.  1 Device  0  . polyethylene glycol (MIRALAX / GLYCOLAX) packet Take 17 g by mouth daily.  30 each  0  . [DISCONTINUED] potassium chloride (K-DUR) 10 MEQ tablet Take 1 tablet (10 mEq total) by mouth daily.  30 tablet  0   No current facility-administered medications for this visit.     Allergies:  Allergies  Allergen Reactions  . Food     nuts  . Peanut-Containing Drug Products   .  Penicillins Other (See Comments)    REACTION: outer rectal itching    Medical History: Past Medical History  Diagnosis Date  . Diabetes mellitus   . Hyperlipidemia   . Asthma     dx in adulthood (73 y/o) PFT 09/08/09 FEV1 1.70/81%; FEV1/FVC 0.75; +resp to dilartor; NI LV/DLCO  . Breast CA     hx of bilateral diagnosied in 2006, recurred 2011- [name]  . Colonic polyp   . Osteopenia   . Allergic rhinitis   . OSA (obstructive sleep apnea)     CPAP intolerant  . Peripheral neuropathy   . COPD (chronic obstructive pulmonary disease)   . Chronic constipation   . HTN (hypertension) 11-2011    Surgical  History:  Past Surgical History  Procedure Laterality Date  . Appendectomy    . Mastectomy  2006    bilateral  . Pilonidal cyst surgery      ? of  . Flexible sigmoidoscopy N/A 01/31/2013    Procedure: FLEXIBLE SIGMOIDOSCOPY;  Surgeon: [name], MD;  Location: [place];  Service: Endoscopy;  Laterality: N/A;  unprepped with sedation     Review of Systems: A 10 point review of systems was conducted and is otherwise negative except for what is noted above.    HEALTH MAINTENANCE:  Mammogram 05/06/12  Colonoscopy 12/10/04, flex sig on 01/2013  Bone Density 06/06/12-normal   Physical Exam: Blood pressure 110/68, pulse 80, temperature 99 F (37.2 C), temperature source Oral, resp. rate 20, height 5' 3.5" (1.613 m), weight 189 lb 8 oz (85.957 kg). GENERAL: Patient is a well appearing female in no acute distress HEENT:  Sclerae anicteric.  Oropharynx clear and moist. No ulcerations or evidence of oropharyngeal candidiasis. Neck is supple.  NODES:  No cervical, supraclavicular, or axillary lymphadenopathy palpated.  BREAST EXAM:  LUNGS:  Clear to auscultation bilaterally.  No wheezes or rhonchi. HEART:  Regular rate and rhythm. No murmur appreciated. ABDOMEN:  Soft, nontender.  Positive, normoactive bowel sounds. No organomegaly palpated. MSK:  No focal spinal tenderness to palpation. She did have difficulty with range of motion in her left shoulder.  Full range of motion in right upper extremity. EXTREMITIES:  No peripheral edema.   SKIN:  Clear with no obvious rashes or skin changes. No nail dyscrasia. NEURO:  Nonfocal. Well oriented.  Appropriate affect. ECOG PERFORMANCE STATUS: 1 - Symptomatic but completely ambulatory   Lab Results: Lab Results  Component Value Date   WBC 10.1 04/08/2014   HGB 13.9 04/08/2014   HCT 41.2 04/08/2014   MCV 88.9 04/08/2014   PLT 360 04/08/2014     Chemistry      Component Value Date/Time   NA 142 04/08/2014 0946   NA 135 03/04/2014 1213    K 4.0 04/08/2014 0946   K 3.7 03/04/2014 1213   CL 99 03/04/2014 1213   CL 105 12/08/2012 1409   CO2 27 04/08/2014 0946   CO2 26 03/04/2014 1213   BUN 15.0 04/08/2014 0946   BUN 11 03/04/2014 1213   CREATININE 0.9 04/08/2014 0946   CREATININE 0.8 03/04/2014 1213      Component Value Date/Time   CALCIUM 10.8* 04/08/2014 0946   CALCIUM 10.3 03/04/2014 1213   ALKPHOS 74 04/08/2014 0946   ALKPHOS 67 10/01/2013 1141   AST 16 04/08/2014 0946   AST 20 03/04/2014 1213   ALT 13 04/08/2014 0946   ALT 15 03/04/2014 1213   BILITOT 0.46 04/08/2014 0946   BILITOT 0.7 10/01/2013  1141      Assessment and Plan: [name] 73 y.o. femalewith  1. H/o ER/PR positive invasive mammary carcinoma in 2006 with recurrence in 2011.  She underwent excision on 04/18/2010 followed by radiation and is now taking Letrozole daily.  She is tolerating this well.  I recommended she continue this.  2. Survivorship:  We reviewed her health maintenance and she will be due for a bone density in June, 2015.  I will order this at [facility] at her request.  She is otherwise up to date.  3. Hypercalcemia.  We will have her return in 1-2 weeks for a re-check.  If it is still elevated, she will need further work up.  4. Limited left upper extremity range of motion.  She does have some tightness and stiffness in her left arm I noticed when having her lift her arm up to perform a breast exam.  I have referred her to physical therapy for evaluation of this.    The patient will return in 6 months for labs and evaluation.   She knows to call us in the interim for any questions or concerns.  We can certainly see her sooner if needed.  I spent 25 minutes counseling the patient face to face.  The total time spent in the appointment was 30 minutes.  Minette Headland, [place] [phone] 04/08/2014 10:45 AM

## 2014-04-09 LAB — VITAMIN D 25 HYDROXY (VIT D DEFICIENCY, FRACTURES): VIT D 25 HYDROXY: 27 ng/mL — AB (ref 30–89)

## 2014-04-12 ENCOUNTER — Other Ambulatory Visit: Payer: Self-pay | Admitting: Adult Health

## 2014-04-12 ENCOUNTER — Telehealth: Payer: Self-pay | Admitting: *Deleted

## 2014-04-12 DIAGNOSIS — E559 Vitamin D deficiency, unspecified: Secondary | ICD-10-CM

## 2014-04-12 MED ORDER — ERGOCALCIFEROL 1.25 MG (50000 UT) PO CAPS: 50000.0000 [IU] | ORAL_CAPSULE | ORAL | Status: DC

## 2014-04-12 NOTE — Telephone Encounter (Signed)
Called pt to inform her of Vit D results(27). Communicated with pt that she has been Rx ergocalciferol 50,000U capsules. Instructed pt to take 1 capsule once a week for a total of 4 weeks, then to take OTC Vit D 3 2000 IU 1 capsule daily. Told her we will see her in 1 -2 weeks for a recheck and will check her calcium as well at that time. Message to be forwarded to Charlestine Massed, NP.

## 2014-04-20 ENCOUNTER — Other Ambulatory Visit (HOSPITAL_BASED_OUTPATIENT_CLINIC_OR_DEPARTMENT_OTHER): Payer: Medicare Other

## 2014-04-20 DIAGNOSIS — C44599 Other specified malignant neoplasm of skin of other part of trunk: Secondary | ICD-10-CM

## 2014-04-20 DIAGNOSIS — Z853 Personal history of malignant neoplasm of breast: Secondary | ICD-10-CM

## 2014-04-20 DIAGNOSIS — C44509 Unspecified malignant neoplasm of skin of other part of trunk: Secondary | ICD-10-CM

## 2014-04-20 LAB — COMPREHENSIVE METABOLIC PANEL (CC13)
ALK PHOS: 66 U/L (ref 40–150)
ALT: 14 U/L (ref 0–55)
AST: 15 U/L (ref 5–34)
Albumin: 3.9 g/dL (ref 3.5–5.0)
Anion Gap: 12 mEq/L — ABNORMAL HIGH (ref 3–11)
BILIRUBIN TOTAL: 0.56 mg/dL (ref 0.20–1.20)
BUN: 13.3 mg/dL (ref 7.0–26.0)
CO2: 24 meq/L (ref 22–29)
Calcium: 10.6 mg/dL — ABNORMAL HIGH (ref 8.4–10.4)
Chloride: 102 mEq/L (ref 98–109)
Creatinine: 0.9 mg/dL (ref 0.6–1.1)
Glucose: 119 mg/dl (ref 70–140)
Potassium: 4.1 mEq/L (ref 3.5–5.1)
SODIUM: 138 meq/L (ref 136–145)
TOTAL PROTEIN: 7.4 g/dL (ref 6.4–8.3)

## 2014-04-27 ENCOUNTER — Ambulatory Visit: Payer: Medicare Other | Admitting: Physical Therapy

## 2014-04-27 ENCOUNTER — Telehealth: Payer: Self-pay

## 2014-04-27 ENCOUNTER — Telehealth: Payer: Self-pay | Admitting: Internal Medicine

## 2014-04-27 DIAGNOSIS — C50919 Malignant neoplasm of unspecified site of unspecified female breast: Secondary | ICD-10-CM

## 2014-04-27 MED ORDER — LETROZOLE 2.5 MG PO TABS: ORAL_TABLET | ORAL | Status: DC

## 2014-04-27 NOTE — Telephone Encounter (Signed)
Pt left msg that she has not had her letrozole since June 2014 and wonders if that is why her Ca level is elevated.  Pt states she just realized today that she has not been taking it.  Reviewed prescription - appears she should have been on letrozole for 35 months but has only had refills ordered for 20 months.  Last fill date was June 01, 2013 for 30 tablets - therefore patient has not had letrozole since late July 2014.  It is not clear which months prior to June 23 she did not take med and/or which months she did not pick up refills.     Advised [name] of situation.  Per Ginger Blue - I let patient know she needs to start back on the letrozole immediately and take it daily.  Also let pt know her most recent calcium lab results.  Pt voiced understanding.

## 2014-04-27 NOTE — Telephone Encounter (Signed)
Caller name:Wilmoth Zollner Relation to JO:ACZYSAY Call back number: [phone] Pharmacy: [id number] - , [address]   Reason for call: to request a refill for letrozole (FEMARA) 2.5 MG tablet

## 2014-04-27 NOTE — Telephone Encounter (Signed)
Refill for Femara [facility] to [facility] on [address]

## 2014-05-17 ENCOUNTER — Ambulatory Visit: Payer: Medicare Other | Attending: Oncology | Admitting: Physical Therapy

## 2014-05-17 DIAGNOSIS — M24519 Contracture, unspecified shoulder: Secondary | ICD-10-CM | POA: Insufficient documentation

## 2014-05-17 DIAGNOSIS — C50919 Malignant neoplasm of unspecified site of unspecified female breast: Secondary | ICD-10-CM | POA: Insufficient documentation

## 2014-05-17 DIAGNOSIS — IMO0001 Reserved for inherently not codable concepts without codable children: Secondary | ICD-10-CM | POA: Insufficient documentation

## 2014-05-20 ENCOUNTER — Ambulatory Visit: Payer: Medicare Other | Admitting: Physical Therapy

## 2014-05-25 LAB — HM DEXA SCAN

## 2014-05-27 ENCOUNTER — Ambulatory Visit: Payer: Medicare Other | Admitting: Physical Therapy

## 2014-05-28 ENCOUNTER — Ambulatory Visit: Payer: Medicare Other | Admitting: Physical Therapy

## 2014-06-02 ENCOUNTER — Ambulatory Visit: Payer: Medicare Other

## 2014-06-03 ENCOUNTER — Ambulatory Visit: Payer: Medicare Other | Admitting: Physical Therapy

## 2014-06-04 ENCOUNTER — Encounter: Payer: Self-pay | Admitting: Internal Medicine

## 2014-06-10 ENCOUNTER — Telehealth: Payer: Self-pay | Admitting: Internal Medicine

## 2014-06-10 DIAGNOSIS — M899 Disorder of bone, unspecified: Secondary | ICD-10-CM

## 2014-06-10 DIAGNOSIS — M949 Disorder of cartilage, unspecified: Principal | ICD-10-CM

## 2014-06-10 NOTE — Telephone Encounter (Signed)
Bone density showed mild osteopenia, recommend to stay active, take calcium and vitamin D. Good results

## 2014-06-10 NOTE — Telephone Encounter (Signed)
Pt notified,

## 2014-06-21 ENCOUNTER — Encounter: Payer: Self-pay | Admitting: Internal Medicine

## 2014-06-22 ENCOUNTER — Other Ambulatory Visit: Payer: Self-pay | Admitting: *Deleted

## 2014-06-22 DIAGNOSIS — E559 Vitamin D deficiency, unspecified: Secondary | ICD-10-CM

## 2014-06-22 MED ORDER — ERGOCALCIFEROL 1.25 MG (50000 UT) PO CAPS: 50000.0000 [IU] | ORAL_CAPSULE | ORAL | Status: DC

## 2014-06-28 ENCOUNTER — Other Ambulatory Visit: Payer: Self-pay | Admitting: Internal Medicine

## 2014-07-08 ENCOUNTER — Other Ambulatory Visit: Payer: Self-pay | Admitting: *Deleted

## 2014-07-08 DIAGNOSIS — C50912 Malignant neoplasm of unspecified site of left female breast: Secondary | ICD-10-CM

## 2014-07-08 MED ORDER — LETROZOLE 2.5 MG PO TABS: ORAL_TABLET | ORAL | Status: DC

## 2014-07-09 ENCOUNTER — Other Ambulatory Visit: Payer: Self-pay | Admitting: *Deleted

## 2014-07-09 DIAGNOSIS — C50912 Malignant neoplasm of unspecified site of left female breast: Secondary | ICD-10-CM

## 2014-07-09 MED ORDER — LETROZOLE 2.5 MG PO TABS: ORAL_TABLET | ORAL | Status: DC

## 2014-07-21 ENCOUNTER — Other Ambulatory Visit: Payer: Self-pay | Admitting: Internal Medicine

## 2014-07-22 ENCOUNTER — Encounter (INDEPENDENT_AMBULATORY_CARE_PROVIDER_SITE_OTHER): Payer: Self-pay | Admitting: General Surgery

## 2014-07-22 ENCOUNTER — Ambulatory Visit (INDEPENDENT_AMBULATORY_CARE_PROVIDER_SITE_OTHER): Payer: Medicare Other | Admitting: General Surgery

## 2014-07-22 VITALS — BP 114/76 | HR 72 | Resp 12 | Ht 63.5 in | Wt 189.2 lb

## 2014-07-22 DIAGNOSIS — Z853 Personal history of malignant neoplasm of breast: Secondary | ICD-10-CM

## 2014-07-22 DIAGNOSIS — Z0279 Encounter for issue of other medical certificate: Secondary | ICD-10-CM

## 2014-07-22 NOTE — Progress Notes (Signed)
History: Patient returns for routine followup for breast cancer with history of bilateral mastectomy for DCIS on the right and T1 node negative invasive cancer on the left in 2006.Marland Kitchen She has a history of an axillary skin recurrence on the left side in 2011 status post excision and radiation and now on adjuvant letrazole. She reports no problems in terms of her chest wall or skin or arm swelling. She has a few hot flashes. A little back discomfort but no persistent pain.  Had osteopenia on a recent bone density study, followed at the cancer center  Exam: BP 114/76  Pulse 72  Resp 12  Ht 5' 3.5" (1.613 m)  Wt 189 lb 3.2 oz (85.821 kg)  BMI 32.99 kg/m2 General: Overweight but otherwise healthy-appearing Afro-American female Skin: Warm and dry without rash infection Lymph nodes: No cervical, supraclavicular or axillary nodes palpable Lungs: Clear equal breath sounds bilaterally Chest wall: Status post bilateral mastectomy. Mild postradiation changes on the left. There is no skin thickening or mass or other evidence of recurrence.  Assessment and plan: History bilateral mastectomy for DCIS and invasive cancer with history of skin recurrence on the left and radiation. On hormonal treatment. No clinical evidence of recurrence. Return in 6 months.

## 2014-07-24 ENCOUNTER — Other Ambulatory Visit: Payer: Self-pay | Admitting: Internal Medicine

## 2014-07-26 ENCOUNTER — Telehealth: Payer: Self-pay

## 2014-07-26 NOTE — Telephone Encounter (Signed)
Patient requesting handicap placard renewal form be completed. Form awaiting signature.

## 2014-07-28 NOTE — Telephone Encounter (Signed)
Handled by [facility] CMA

## 2014-07-29 ENCOUNTER — Telehealth: Payer: Self-pay | Admitting: Internal Medicine

## 2014-07-29 NOTE — Telephone Encounter (Signed)
Pt would like a f/u call regarding the status of  handicap renewal sticker.

## 2014-07-29 NOTE — Telephone Encounter (Signed)
Already give to Pt.

## 2014-07-29 NOTE — Telephone Encounter (Signed)
Handicap form signed, she reports  is unable to walk too far due to asthma

## 2014-07-30 NOTE — Telephone Encounter (Signed)
Spoke with Pt, explained to her that I put her handicap sticker form in the mail at the beginning of the week and instructed her if she does not get it in the mail by next Monday or Tuesday to give me a call back.

## 2014-08-02 NOTE — Telephone Encounter (Signed)
Placed form in mail on 08/02/2014.

## 2014-08-05 ENCOUNTER — Ambulatory Visit: Payer: Medicare Other | Admitting: Internal Medicine

## 2014-09-13 ENCOUNTER — Other Ambulatory Visit: Payer: Self-pay

## 2014-09-13 MED ORDER — MONTELUKAST SODIUM 10 MG PO TABS: ORAL_TABLET | ORAL | Status: DC

## 2014-09-21 ENCOUNTER — Ambulatory Visit (INDEPENDENT_AMBULATORY_CARE_PROVIDER_SITE_OTHER): Payer: Medicare Other | Admitting: Internal Medicine

## 2014-09-21 ENCOUNTER — Encounter: Payer: Self-pay | Admitting: Internal Medicine

## 2014-09-21 VITALS — BP 132/60 | HR 74 | Ht 63.0 in | Wt 188.0 lb

## 2014-09-21 DIAGNOSIS — Z23 Encounter for immunization: Secondary | ICD-10-CM

## 2014-09-21 MED ORDER — ZALEPLON 5 MG PO CAPS: ORAL_CAPSULE | ORAL | Status: DC

## 2014-09-21 NOTE — Progress Notes (Signed)
Patient ID: [name], female    DOB: December 08, 1941, 73 y.o.   MRN: [id number]  HPI 07/16/11- 73 yoF former smoker followed for OSA/ failed CPAP, asthma, allergic rhinitis complicated by DM, hx breast cancer Blames heat for need to resume using her rescue inhaler..  She associates purple areas on lips from using her nebulizer machine- ? Hard enough to bruise? She couldn't tolerate and stopped using CPAP. We reviewed symptoms and warning signs again.  She has had spots on hands, pruritic, " inconclusive " on biopsy by dermatologist, Dr Nevada Crane. Admits night sweats, cough, scant yellow phlegm.  Last CXR 12/27/10 f/u for recurrent breast cancer- left.    10/16/11- 73 yoF former smoker followed for OSA/ failed CPAP, asthma, allergic rhinitis complicated by DM, hx breast cancer She reports an urticarial reaction to peanuts 2 months ago and is unsure if she has had a reaction in the past 2 chocolate. She would like to look at food allergy testing so she knows better to talk to watch out for. We discussed food allergy versus intolerance.  She is taking prednisone and valacyclovir for a hyperpigmented rash on her hands, after bx by dermatologist. Possibly viral warts. Allergy symptoms do better in cold weather but will also improve on her steroids. Cough has been drier with scant clear sputum. She has not needed her nebulizer machine at all.  Chest x-ray: 07/16/2011-NAD, mild CE, bronchitis changes, arthritis in spine.   04/15/12- 73 yoF former smoker followed for OSA/ failed CPAP, asthma, allergic rhinitis, Food Allergy complicated by DM, hx breast cancer Had 2 chocolate chip cookies yesterday-started itching and swelling; did not go to ER; still itching today. On May 6 she 82 chocolate chip cookies, not recognized contained nuts. 4-5 hours later she developed facial swelling/angioedema with some itching but no wheeze. She took Benadryl. She continues to have some swelling in the left side of her  face now 24 hours later. She never had any increased wheeze throat swelling or other airway discomfort. She has noticed increased wheeze and dry cough over the last 2 months, responsive to her rescue inhaler. Additional problem of rash on her arms which begins like pimples but develops a hyperpigmented area about 3 cm diameter which response to steroid cream. Dermatologist did biopsy. Results nonspecific. No relation noted between this rash and her other health problems, exposures or other triggers.  06/19/12- 73 yoF former smoker followed for OSA/ failed CPAP, asthma, allergic rhinitis, Food Allergy complicated by DM, hx breast cancer Had to recently go to ER for throat swelling-unsure of cause. Not sleeping well at times and doesnt use CPAP recently-has had more stressors going on to cause this. We reviewed her ER note. Over several months she's had episodes of itching hands then swelling of one side or the other of her tongue that July 7 she had angioedema in her throat. There is no family history of angioedema and she has not been on ACE inhibitors. She avoids known foods that cause problems in the past. 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 None clearly in significant range. She did not take Benadryl as recommended that the emergency room because of listed side effect of sleepiness. We discussed alternatives. She has an EpiPen if needed.  08/22/12-  73 yoF former smoker followed for OSA/ failed CPAP, asthma, allergic rhinitis, Food Allergy complicated by DM, hx breast cancer Cough-clear in color; using rescue inhaler more than usual; Increased cough lying in  bed and aware of reflux. Insomnia wakes her around 2 AM but she does not think that is from coughing. She has been irregular using Symbicort, trying to use it as a rescue inhaler. We discussed that. Some increase in exertional dyspnea.  12/23/12- 73 yoF former smoker followed for OSA/ failed  CPAP, asthma, allergic rhinitis, Food Allergy, GERD, complicated by DM, hx breast cancer/ XRT FOLLOWS FOR: whole month of December has had congestion-cough-clear in color;some of its related to reflux and some due to the weather per patient. Had a cold. Chest congestion with clear mucus. Using rescue inhaler twice daily now but has not needed nebulizer. Continues Symbicort. Nothing purulent and no fever or blood. Sonata not enough to keep her asleep. Failed NyQuil. Afraid of sleepwalking with Ambien. Snoring status unclear  04/20/13- 73 yoF former smoker followed for OSA/ failed CPAP, asthma, allergic rhinitis, Food Allergy, GERD, complicated by DM, hx breast cancer/ XRT FOLLOWS FOR: increased allergies due to pollen increased; has also notcied certain smells or things that she may have eaten cause slight problems; usually can use rescue inhaler and this calms things down.  Also,pt states that her sleep patterns are not doing well-some night no sleep at all. Blames cough on pollen/ sinus drip. Dry cough- inhaler helps. May reflux a little at times, but no dysphagia. Occasional insomnia- busy brain. 2 x zaleplon sufficient.. CXR 12/30/12 IMPRESSION:  No active cardiopulmonary disease.  Original Report Authenticated By: Rolm Baptise, M.D.   08/21/13-73 yoF former smoker followed for OSA/ failed CPAP, asthma, allergic rhinitis, Food Allergy, GERD, complicated by DM, hx breast cancer/ XRT FOLLOWS FOR:  Symptoms worsening x3 months, as of yesterday throat irritated and sore Exercising less, which she blames for increased dyspnea with exertion. Using metered inhaler more, blames weather. Lis ear ache. Insomnia-still wakes occasionally but stopped using temazepam or Sonata as unhelpful.  02/18/14- 73 yoF former smoker followed for OSA/ failed CPAP, asthma, allergic rhinitis, Food Allergy, GERD, complicated by DM, hx breast cancer/ XRT FOLLOWS FOR: With the increase in humidity pt feels more congested. C/o  frequent producutive cough with white mucus throughout day 3 weeks. Pt states she has episodes of quick sharp chest pain in the left anterior chest and right lateral  chest.  3 weeks of increased cough with clear thick sputum. Does not feel sick- not a cold. She is not using Symbicort after seeing the hands warning of possible sudden death from the long-acting bronchodilator component. We discussed this and the alternatives.  04/01/14- 58 yoF former smoker followed for OSA/ failed CPAP, asthma, allergic rhinitis, Food Allergy, GERD, complicated by DM, hx breast cancer/ XRT FOLLOWS FOR: Slight increased congestion today but has not had to use rescue inhaler or neb tx since being on QVAR-feels ALOT better overall. Did use neb this AM after being outdoors last night. First time used in 3 weeks. Occasional rescue inhaler.  09/21/14- 57 yoF former smoker followed for OSA/ failed CPAP, asthma, allergic rhinitis, Food Allergy, GERD, complicated by DM, hx breast cancer/ XRT FOLLOWS FOR:  allergies are doing great.  pt stated that she falls asleep about 10 and wakes up at 2 and is not able to fall back to sleep.      Review of Systems-See HPI Constitutional:   No-   weight loss, night sweats, fevers, chills, fatigue, lassitude. HEENT:   No-  headaches, difficulty swallowing, tooth/dental problems, sore throat,       No-  sneezing, itching, +ear ache, nasal congestion, +post  nasal drip,  CV:  No-   chest pain, orthopnea, PND, swelling in lower extremities, anasarca, dizziness, palpitations Resp: + shortness of breath with exertion or at rest.              No-  productive cough, n+ non-productive cough,  No- coughing up of blood.              No-   change in color of mucus.   Wheezing+.   Skin: No-   rash or lesions. GI:  +   heartburn, indigestion, no-abdominal pain, nausea, vomiting, GU:  MS:  No-   joint pain or swelling. . Neuro-     nothing unusual Psych:  No- change in mood or affect. No  depression or anxiety.  No memory loss.  Objective:   Physical Exam General- Alert, Oriented, Affect-appropriate, Distress- none acute    Overweight.  Skin- clear Lymphadenopathy- none Head- atraumatic            Eyes- Gross vision intact, PERRLA, conjunctivae clear secretions            Ears- Hearing, canals normal but has cotton L earis            Nose- Clear, no-Septal dev, mucus, polyps, erosion, perforation             Throat- Mallampati III , mucosa clear , drainage- none, tonsils- atrophic Neck- flexible , trachea midline, no stridor , thyroid nl, carotid no bruit Chest - symmetrical excursion , unlabored           Heart/CV- RRR , no murmur , no gallop  , no rub, nl s1 s2                           - JVD- none , edema- none, stasis changes- none, varices- none           Lung- wheeze-none, cough- none, unlabored,  dullness-none, rub- none           Chest wall-  Abd-  Br/ Gen/ Rectal- Not done, not indicated Extrem- cyanosis- none, clubbing, none, atrophy- none, strength- nl Neuro- grossly intact to observation

## 2014-09-21 NOTE — Patient Instructions (Signed)
Flu vax  Script for Sonata to try for sleep if you wake in the middle of the night with difficulty getting back to sleep

## 2014-09-30 ENCOUNTER — Other Ambulatory Visit: Payer: Self-pay

## 2014-09-30 MED ORDER — METFORMIN HCL 1000 MG PO TABS: ORAL_TABLET | ORAL | Status: DC

## 2014-10-05 ENCOUNTER — Ambulatory Visit (HOSPITAL_BASED_OUTPATIENT_CLINIC_OR_DEPARTMENT_OTHER): Payer: Medicare Other | Admitting: Adult Health

## 2014-10-05 ENCOUNTER — Telehealth: Payer: Self-pay | Admitting: Adult Health

## 2014-10-05 ENCOUNTER — Encounter: Payer: Medicare Other | Admitting: Adult Health

## 2014-10-05 ENCOUNTER — Other Ambulatory Visit (HOSPITAL_BASED_OUTPATIENT_CLINIC_OR_DEPARTMENT_OTHER): Payer: Medicare Other

## 2014-10-05 ENCOUNTER — Encounter: Payer: Self-pay | Admitting: Adult Health

## 2014-10-05 ENCOUNTER — Ambulatory Visit: Payer: Medicare Other

## 2014-10-05 VITALS — BP 133/83 | HR 74 | Temp 99.1°F | Resp 18 | Ht 63.0 in | Wt 184.0 lb

## 2014-10-05 DIAGNOSIS — Z853 Personal history of malignant neoplasm of breast: Secondary | ICD-10-CM

## 2014-10-05 DIAGNOSIS — C50919 Malignant neoplasm of unspecified site of unspecified female breast: Secondary | ICD-10-CM

## 2014-10-05 DIAGNOSIS — E559 Vitamin D deficiency, unspecified: Secondary | ICD-10-CM

## 2014-10-05 LAB — COMPREHENSIVE METABOLIC PANEL (CC13)
ALT: 16 U/L (ref 0–55)
AST: 17 U/L (ref 5–34)
Albumin: 4.2 g/dL (ref 3.5–5.0)
Alkaline Phosphatase: 65 U/L (ref 40–150)
Anion Gap: 12 mEq/L — ABNORMAL HIGH (ref 3–11)
BILIRUBIN TOTAL: 0.65 mg/dL (ref 0.20–1.20)
BUN: 16.5 mg/dL (ref 7.0–26.0)
CO2: 28 mEq/L (ref 22–29)
CREATININE: 0.9 mg/dL (ref 0.6–1.1)
Calcium: 11.1 mg/dL — ABNORMAL HIGH (ref 8.4–10.4)
Chloride: 101 mEq/L (ref 98–109)
Glucose: 85 mg/dl (ref 70–140)
Potassium: 3.6 mEq/L (ref 3.5–5.1)
Sodium: 140 mEq/L (ref 136–145)
Total Protein: 7.7 g/dL (ref 6.4–8.3)

## 2014-10-05 LAB — CBC WITH DIFFERENTIAL/PLATELET
BASO%: 0.7 % (ref 0.0–2.0)
Basophils Absolute: 0.1 10*3/uL (ref 0.0–0.1)
EOS%: 1.4 % (ref 0.0–7.0)
Eosinophils Absolute: 0.2 10*3/uL (ref 0.0–0.5)
HEMATOCRIT: 44.5 % (ref 34.8–46.6)
HGB: 14.3 g/dL (ref 11.6–15.9)
LYMPH%: 14.6 % (ref 14.0–49.7)
MCH: 29.5 pg (ref 25.1–34.0)
MCHC: 32.1 g/dL (ref 31.5–36.0)
MCV: 91.7 fL (ref 79.5–101.0)
MONO#: 0.8 10*3/uL (ref 0.1–0.9)
MONO%: 6.6 % (ref 0.0–14.0)
NEUT#: 9.3 10*3/uL — ABNORMAL HIGH (ref 1.5–6.5)
NEUT%: 76.7 % (ref 38.4–76.8)
PLATELETS: 350 10*3/uL (ref 145–400)
RBC: 4.85 10*6/uL (ref 3.70–5.45)
RDW: 14.2 % (ref 11.2–14.5)
WBC: 12.2 10*3/uL — ABNORMAL HIGH (ref 3.9–10.3)
lymph#: 1.8 10*3/uL (ref 0.9–3.3)

## 2014-10-05 NOTE — Patient Instructions (Signed)
You are doing well.  You have no sign of recurrence.  Continue taking Letrozole.  I recommend healthy diet, exercise and chest exams.    We will see you back in 6 months.  Please call us if you have any questions or concerns.

## 2014-10-05 NOTE — Progress Notes (Signed)
This encounter was created in error - please disregard.

## 2014-10-05 NOTE — Progress Notes (Signed)
Hematology and Oncology Follow Up Visit  [name] [id number] 1941-06-11 73 y.o. 10/05/2014 3:19 PM     Principle Diagnosis:[name] 73 y.o. 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     Prior Therapy: 1. Patient underwent a screening mammogram and abnormalities were detected in her breasts. Biopsy was positive for invasive cancer. She underwent a bilateral mastectomy on 01/30/05 by Dr. Excell Seltzer. 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   2. Patient later had recurrence along the left mastectomy site of invasive ductal carcinoma, lymph nodes were negative and it was excised on 04/18/10, she underwent radiation therapy, followed by adjuvant Letrozole   Current therapy:  Letrozole daily  Interim History: [name] 73 y.o. female with h/o invasive ductal carcinoma who is here today for f/u and evaluation. [name] is doing well today.  She is taking Letrozole daily.  She is tolerating it well.  She denies hot flashes, joint aches, vaginal dryness.  She does note some bilateral back pain in her posterior lower ribs bilaterally.  She thinks it has to do with how she lies when she goes to bed.  Getting up and stretching helps.  The pain she says is a 2/10.  She is reporting more gas than usual, but also notes she is eating out more so than normal as well.  Otherwise, she has no other questions or concerns.  We updated her health maintenance below.   Medications:  Current Outpatient Prescriptions  Medication Sig Dispense Refill  . aspirin 81 MG tablet Take 81 mg by mouth daily.       . beclomethasone (QVAR) 80 MCG/ACT inhaler Inhale 2 puffs into the lungs 2 (two) times daily.  1 Inhaler  0  . cholecalciferol (VITAMIN D) 1000 UNITS tablet Take 1,000 Units by  mouth daily.      Marland Kitchen glucose blood test strip Check blood sugar daily. DX code: 250.00  100 each  12  . L-Methylfolate-B6-B12 (METANX PO) Take 1 tablet by mouth 2 (two) times daily.      Marland Kitchen letrozole (FEMARA) 2.5 MG tablet TAKE 1 TABLET (2.5 MG TOTAL) BY MOUTH DAILY.  30 tablet  3  . magnesium hydroxide (MILK OF MAGNESIA) 800 MG/5ML suspension Take 5 mLs by mouth every evening.        . metFORMIN (GLUCOPHAGE) 1000 MG tablet TAKE 1 TABLET EVERY DAY  90 tablet  1  . montelukast (SINGULAIR) 10 MG tablet TAKE 1 TABLET (10 MG TOTAL) BY MOUTH AT BEDTIME.  30 tablet  0  . Multiple Vitamin (MULTIVITAMIN) tablet Take 1 tablet by mouth daily.      . simvastatin (ZOCOR) 40 MG tablet TAKE 1 TABLET BY MOUTH AT BEDTIME  30 tablet  5  . triamterene-hydrochlorothiazide (MAXZIDE-25) 37.5-25 MG per tablet TAKE 1 TABLET BY MOUTH DAILY  30 tablet  6  . VENTOLIN HFA 108 (90 BASE) MCG/ACT inhaler INHALE 2 PUFFS INTO THE LUNGS EVERY 4 HOURS AS NEEDED FOR WHEEZING OR SHORTNESS OF BREATH  18 each  5  . albuterol (ACCUNEB) 0.63 MG/3ML nebulizer solution Take 1 ampule by nebulization 4 (four) times daily as needed.      . diphenhydrAMINE (BENADRYL) 25 MG tablet Take 50 mg by mouth daily as needed. For swelling      . EPINEPHrine (EPI-PEN) 0.3 mg/0.3  mL DEVI Inject 0.3 mLs (0.3 mg total) into the muscle once.  1 Device  0  . polyethylene glycol (MIRALAX / GLYCOLAX) packet Take 17 g by mouth daily.  30 each  0  . [DISCONTINUED] potassium chloride (K-DUR) 10 MEQ tablet Take 1 tablet (10 mEq total) by mouth daily.  30 tablet  0   No current facility-administered medications for this visit.     Allergies:  Allergies  Allergen Reactions  . Food     nuts  . Peanut-Containing Drug Products   . Penicillins Other (See Comments)    REACTION: outer rectal itching    Medical History: Past Medical History  Diagnosis Date  . Diabetes mellitus   . Hyperlipidemia   . Asthma     dx in adulthood (73 y/o) PFT 09/08/09 FEV1  1.70/81%; FEV1/FVC 0.75; +resp to dilartor; NI LV/DLCO  . Breast CA     hx of bilateral diagnosied in 2006, recurred 2011- [name]  . Colonic polyp   . Osteopenia   . Allergic rhinitis   . OSA (obstructive sleep apnea)     CPAP intolerant  . Peripheral neuropathy   . COPD (chronic obstructive pulmonary disease)   . Chronic constipation   . HTN (hypertension) 11-2011    Surgical History:  Past Surgical History  Procedure Laterality Date  . Appendectomy    . Mastectomy  2006    bilateral  . Pilonidal cyst surgery      ? of  . Flexible sigmoidoscopy N/A 01/31/2013    Procedure: FLEXIBLE SIGMOIDOSCOPY;  Surgeon: [name], MD;  Location: [place];  Service: Endoscopy;  Laterality: N/A;  unprepped with sedation     Review of Systems: A 10 point review of systems was conducted and is otherwise negative except for what is noted above.    HEALTH MAINTENANCE:  Mammogram 05/06/12, s/p bialteral mastectomy Colonoscopy 12/10/04, flex sig on 01/2013  Bone Density 05/25/2014, osteopenia   Physical Exam: Blood pressure 133/83, pulse 74, temperature 99.1 F (37.3 C), temperature source Oral, resp. rate 18, height '5\' 3"'  (1.6 m), weight 184 lb (83.462 kg). GENERAL: Patient is a well appearing female in no acute distress HEENT:  Sclerae anicteric.  Oropharynx clear and moist. No ulcerations or evidence of oropharyngeal candidiasis. Neck is supple.  NODES:  No cervical, supraclavicular, or axillary lymphadenopathy palpated.  BREAST EXAM: s/p bilateral mastectomy, no nodularity, no sign of recurrence.  Benign bilateral breast exam. LUNGS:  Clear to auscultation bilaterally.  No wheezes or rhonchi. HEART:  Regular rate and rhythm. No murmur appreciated. ABDOMEN:  Soft, nontender.  Positive, normoactive bowel sounds. No organomegaly palpated. MSK:  No focal spinal tenderness to palpation. She did have difficulty with range of motion in her left shoulder.  Full range of  motion in right upper extremity. EXTREMITIES:  No peripheral edema.   SKIN:  Clear with no obvious rashes or skin changes. No nail dyscrasia. NEURO:  Nonfocal. Well oriented.  Appropriate affect. ECOG PERFORMANCE STATUS: 1 - Symptomatic but completely ambulatory   Lab Results: Lab Results  Component Value Date   WBC 12.2* 10/05/2014   HGB 14.3 10/05/2014   HCT 44.5 10/05/2014   MCV 91.7 10/05/2014   PLT 350 10/05/2014     Chemistry      Component Value Date/Time   NA 140 10/05/2014 1239   NA 135 03/04/2014 1213   K 3.6 10/05/2014 1239   K 3.7 03/04/2014 1213   CL 99 03/04/2014 1213  CL 105 12/08/2012 1409   CO2 28 10/05/2014 1239   CO2 26 03/04/2014 1213   BUN 16.5 10/05/2014 1239   BUN 11 03/04/2014 1213   CREATININE 0.9 10/05/2014 1239   CREATININE 0.8 03/04/2014 1213      Component Value Date/Time   CALCIUM 11.1* 10/05/2014 1239   CALCIUM 10.3 03/04/2014 1213   ALKPHOS 65 10/05/2014 1239   ALKPHOS 67 10/01/2013 1141   AST 17 10/05/2014 1239   AST 20 03/04/2014 1213   ALT 16 10/05/2014 1239   ALT 15 03/04/2014 1213   BILITOT 0.65 10/05/2014 1239   BILITOT 0.7 10/01/2013 1141      Assessment and Plan: [name] 73 y.o. femalewith  H/o ER/PR positive invasive mammary carcinoma in 2006 with recurrence in 2011.  She underwent excision on 04/18/2010 followed by radiation and is now taking Letrozole daily.  Sherika is tolerating the Letrozole well.  She will continue this.    We reviewed her bone density results which now demonstrate slight osteopenia.  I recommended weight bearing exercise.    Brynlea continues to have hypercalcemia.  I ordered a PTH to be drawn.    For the pain in her posterior ribs, she declined an x ray when offered.  Should it persist, I will recommend one again.    Jaia was recommended healthy diet, exercise and monthly breast exams.     The patient will return in 6 months for labs and evaluation.   She knows to call us in the  interim for any questions or concerns.  We can certainly see her sooner if needed.  I spent 25 minutes counseling the patient face to face.  The total time spent in the appointment was 30 minutes.  Minette Headland, [place] [phone] 10/05/2014 3:19 PM

## 2014-10-06 LAB — PTH, INTACT AND CALCIUM
Calcium: 10.9 mg/dL — ABNORMAL HIGH (ref 8.4–10.5)
PTH: 27 pg/mL (ref 14–64)

## 2014-10-06 LAB — VITAMIN D 25 HYDROXY (VIT D DEFICIENCY, FRACTURES): Vit D, 25-Hydroxy: 39 ng/mL (ref 30–89)

## 2014-10-07 ENCOUNTER — Ambulatory Visit (INDEPENDENT_AMBULATORY_CARE_PROVIDER_SITE_OTHER): Payer: Medicare Other | Admitting: Internal Medicine

## 2014-10-07 ENCOUNTER — Encounter: Payer: Self-pay | Admitting: Internal Medicine

## 2014-10-07 VITALS — BP 142/85 | HR 76 | Temp 99.1°F | Ht 63.0 in | Wt 184.1 lb

## 2014-10-07 DIAGNOSIS — H532 Diplopia: Secondary | ICD-10-CM

## 2014-10-07 DIAGNOSIS — I10 Essential (primary) hypertension: Secondary | ICD-10-CM

## 2014-10-07 DIAGNOSIS — Z0000 Encounter for general adult medical examination without abnormal findings: Secondary | ICD-10-CM

## 2014-10-07 DIAGNOSIS — E785 Hyperlipidemia, unspecified: Secondary | ICD-10-CM

## 2014-10-07 DIAGNOSIS — J45998 Other asthma: Secondary | ICD-10-CM

## 2014-10-07 DIAGNOSIS — Z23 Encounter for immunization: Secondary | ICD-10-CM

## 2014-10-07 DIAGNOSIS — E119 Type 2 diabetes mellitus without complications: Secondary | ICD-10-CM

## 2014-10-07 DIAGNOSIS — M8580 Other specified disorders of bone density and structure, unspecified site: Secondary | ICD-10-CM

## 2014-10-07 LAB — CALCIUM, IONIZED: Calcium, Ion: 1.32 mmol/L (ref 1.12–1.32)

## 2014-10-07 LAB — LIPID PANEL
CHOLESTEROL: 139 mg/dL (ref 0–200)
HDL: 53.2 mg/dL (ref >39.00)
LDL Cholesterol: 70 mg/dL (ref 0–99)
NONHDL: 85.8
Total CHOL/HDL Ratio: 3
Triglycerides: 80 mg/dL (ref 0.0–149.0)
VLDL: 16 mg/dL (ref 0.0–40.0)

## 2014-10-07 LAB — HEMOGLOBIN A1C: HEMOGLOBIN A1C: 6.5 % (ref 4.6–6.5)

## 2014-10-07 MED ORDER — BECLOMETHASONE DIPROPIONATE 80 MCG/ACT IN AERS: 2.0000 | INHALATION_SPRAY | Freq: Two times a day (BID) | RESPIRATORY_TRACT | Status: DC

## 2014-10-07 NOTE — Assessment & Plan Note (Signed)
Diplopia, no other neurological symptoms, sx related to fatigue. Myasthenia? Plan: Referred to neurology

## 2014-10-07 NOTE — Assessment & Plan Note (Addendum)
Bone density test 05/2014 T score -1.3. Recently was noted to have hypercalcemia, will check an ionized calcium. Continue vitamin D, hold calcium supplements

## 2014-10-07 NOTE — Progress Notes (Signed)
Subjective:    Patient ID: [name], female    DOB: 1941/01/16, 73 y.o.   MRN: [id number]  DOS:  10/07/2014 Type of visit - description :    Here for Medicare AWV:  1. Risk factors based on Past M, S, F history: reviewed  2. Physical Activities: goes to the [facility], feels steady and safe while exercising but stumbles sometimes when walking, see below 3. Depression/mood: occ depression, anxious related to issues w/ her daughter , denies need to see counseling or take meds 4. Hearing: mild problems on the R, saw the hearing specialist before, no need for aids 5. ADL's: Completely independent, still drives  6. Fall Risk: no falls but stumble freq. S/p PT ~ 5 months ago , was felt to be ok, rec a cane  7. home Safety: does feel safe at home  8. Height, weight, &visual acuity: see VS, sees the eye doctor  9. Counseling: provided  10. Labs ordered based on risk factors: if needed  11. Referral Coordination: if needed  12. Care Plan, see assessment and plan  13. Cognitive Assessment: Cognition and motor skills appropriate for age  50. Team care updated   In addition, today we discussed the following: Breast cancer, recently several oncology, felt to be stable One year history of occasional diplopia, usually when she gets sleepy or tired. Asthma, needs a refill on Qvar Diabetes, good medication compliance, ambulatory CBGs always less than 120. High cholesterol, good compliance w/ medication, no apparent side effects. Hypertension, no recent ambulatory BPs. Labs reviewed, due for FLP, A1c. Also needs an EKG.  ROS No  CP, SOB No palpitations, no lower extremity edema (-)   sputum production, (-)hemoptysis No dysuria, gross hematuria, difficulty urinating   No headaches No slurred speech, motor deficits, facial numbness Denies dizziness    Past Medical History  Diagnosis Date  . Diabetes mellitus   . Hyperlipidemia   . Asthma     dx in adulthood (73 y/o) PFT 09/08/09  FEV1 1.70/81%; FEV1/FVC 0.75; +resp to dilartor; NI LV/DLCO  . Breast CA     hx of bilateral diagnosied in 2006, recurred 2011- [name]  . Colonic polyp   . Osteopenia   . Allergic rhinitis   . OSA (obstructive sleep apnea)     CPAP intolerant  . Peripheral neuropathy   . COPD (chronic obstructive pulmonary disease)   . Chronic constipation   . HTN (hypertension) 11-2011    Past Surgical History  Procedure Laterality Date  . Appendectomy    . Mastectomy  2006    bilateral  . Pilonidal cyst surgery      ? of  . Flexible sigmoidoscopy N/A 01/31/2013    Procedure: FLEXIBLE SIGMOIDOSCOPY;  Surgeon: [name], MD;  Location: [place];  Service: Endoscopy;  Laterality: N/A;  unprepped with sedation    History   Social History  . Marital Status: Widowed    Spouse Name: N/A    Number of Children: 2  . Years of Education: N/A   Occupational History  . runs a non-profit    Social History Main Topics  . Smoking status: Former Smoker -- 1.00 packs/day for 4 years    Types: Cigarettes    Quit date: 04/01/1968  . Smokeless tobacco: Never Used     Comment: Quit at age 22  . Alcohol Use: No  . Drug Use: No  . Sexual Activity: Not Currently   Other Topics Concern  . Not  on file   Social History Narrative   Widowed last husband 4-10, lives alone, retired Pharmacist, hospital   2 children, [place] twins       Family History  Problem Relation Age of Onset  . Breast cancer Other     GM,sister  . Colon cancer Neg Hx   . Diabetes Brother   . Heart attack Neg Hx     no h/o early diseae  . COPD Mother     smoker  . Hypertension Mother   . Lung cancer Father     died age 48       Medication List       This list is accurate as of: 10/07/14  7:21 PM.  Always use your most recent med list.               albuterol 0.63 MG/3ML nebulizer solution  Commonly known as:  ACCUNEB  Take 1 ampule by nebulization 4 (four) times daily as needed.     VENTOLIN HFA  108 (90 BASE) MCG/ACT inhaler  Generic drug:  albuterol  INHALE 2 PUFFS INTO THE LUNGS EVERY 4 HOURS AS NEEDED FOR WHEEZING OR SHORTNESS OF BREATH     aspirin 81 MG tablet  Take 81 mg by mouth daily.     beclomethasone 80 MCG/ACT inhaler  Commonly known as:  QVAR  Inhale 2 puffs into the lungs 2 (two) times daily.     cholecalciferol 1000 UNITS tablet  Commonly known as:  VITAMIN D  Take 1,000 Units by mouth daily.     diphenhydrAMINE 25 MG tablet  Commonly known as:  BENADRYL  Take 50 mg by mouth daily as needed. For swelling     EPINEPHrine 0.3 mg/0.3 mL Devi  Commonly known as:  EPI-PEN  Inject 0.3 mLs (0.3 mg total) into the muscle once.     glucose blood test strip  Check blood sugar daily. DX code: 250.00     letrozole 2.5 MG tablet  Commonly known as:  FEMARA  TAKE 1 TABLET (2.5 MG TOTAL) BY MOUTH DAILY.     magnesium hydroxide 800 MG/5ML suspension  Commonly known as:  MILK OF MAGNESIA  Take 5 mLs by mouth every evening.     METANX PO  Take 1 tablet by mouth 2 (two) times daily.     metFORMIN 1000 MG tablet  Commonly known as:  GLUCOPHAGE  TAKE 1 TABLET EVERY DAY     montelukast 10 MG tablet  Commonly known as:  SINGULAIR  TAKE 1 TABLET (10 MG TOTAL) BY MOUTH AT BEDTIME.     multivitamin tablet  Take 1 tablet by mouth daily.     polyethylene glycol packet  Commonly known as:  MIRALAX / GLYCOLAX  Take 17 g by mouth daily.     simvastatin 40 MG tablet  Commonly known as:  ZOCOR  TAKE 1 TABLET BY MOUTH AT BEDTIME     triamterene-hydrochlorothiazide 37.5-25 MG per tablet  Commonly known as:  MAXZIDE-25  TAKE 1 TABLET BY MOUTH DAILY           Objective:   Physical Exam BP 142/85  Pulse 76  Temp(Src) 99.1 F (37.3 C) (Oral)  Ht 5\' 3"  (1.6 m)  Wt 184 lb 2 oz (83.519 kg)  BMI 32.62 kg/m2  SpO2 94% General -- alert, well-developed, NAD.  Neck --no thyromegaly , normal carotid pulse  HEENT-- Not pale.  Lungs -- normal respiratory effort, no  intercostal retractions, no accessory muscle use,  and normal breath sounds.  Heart-- normal rate, regular rhythm, no murmur.  Abdomen-- Not distended, good bowel sounds,soft, non-tender. Extremities-- no pretibial edema bilaterally  Neurologic--  alert & oriented X3. Speech normal, gait appropriate for age, strength symmetric and appropriate for age.  DTRs symmetric. EOMI, PERLA   Psych-- Cognition and judgment appear intact. Cooperative with normal attention span and concentration. No anxious or depressed appearing.        Assessment & Plan:

## 2014-10-07 NOTE — Assessment & Plan Note (Signed)
Seems well controlled, refilled Qvar

## 2014-10-07 NOTE — Assessment & Plan Note (Addendum)
Td 2003,and today pneumonia shot 2008, shingles shot 2009 prevnar-- today Had a Flu shot     C scope 2006 (-) but incomplete, (-) ACBE; per GI notes :neg virtual Cscope 4-11, flex sig 01-2013 -- fecal impaction Next cscope per GI  (Dr Paulita Fujita); h/o tortuos colon  Sees  Gyn: Dr. Leo Grosser S/p B mastectomy, exam done at oncology and surgery  Gait disorder, recommend a cane. See history of present illness

## 2014-10-07 NOTE — Progress Notes (Signed)
Pre visit review using our clinic review tool, if applicable. No additional management support is needed unless otherwise documented below in the visit note. 

## 2014-10-07 NOTE — Assessment & Plan Note (Addendum)
Continue with Maxzide, recent BMP okay  EKG normal sinus rhythm, poor R wave progression, no acute changes, no old EKGs

## 2014-10-07 NOTE — Assessment & Plan Note (Signed)
Continue metformin, check A1c 

## 2014-10-07 NOTE — Patient Instructions (Addendum)
Get your blood work before you leave    Please come back to the office in 4 months for a routine check up ,no   fasting      Fall Prevention and Home Safety Falls cause injuries and can affect all age groups. It is possible to use preventive measures to significantly decrease the likelihood of falls. There are many simple measures which can make your home safer and prevent falls. OUTDOORS  Repair cracks and edges of walkways and driveways.  Remove high doorway thresholds.  Trim shrubbery on the main path into your home.  Have good outside lighting.  Clear walkways of tools, rocks, debris, and clutter.  Check that handrails are not broken and are securely fastened. Both sides of steps should have handrails.  Have leaves, snow, and ice cleared regularly.  Use sand or salt on walkways during winter months.  In the garage, clean up grease or oil spills. BATHROOM  Install night lights.  Install grab bars by the toilet and in the tub and shower.  Use non-skid mats or decals in the tub or shower.  Place a plastic non-slip stool in the shower to sit on, if needed.  Keep floors dry and clean up all water on the floor immediately.  Remove soap buildup in the tub or shower on a regular basis.  Secure bath mats with non-slip, double-sided rug tape.  Remove throw rugs and tripping hazards from the floors. BEDROOMS  Install night lights.  Make sure a bedside light is easy to reach.  Do not use oversized bedding.  Keep a telephone by your bedside.  Have a firm chair with side arms to use for getting dressed.  Remove throw rugs and tripping hazards from the floor. KITCHEN  Keep handles on pots and pans turned toward the center of the stove. Use back burners when possible.  Clean up spills quickly and allow time for drying.  Avoid walking on wet floors.  Avoid hot utensils and knives.  Position shelves so they are not too high or low.  Place commonly used objects  within easy reach.  If necessary, use a sturdy step stool with a grab bar when reaching.  Keep electrical cables out of the way.  Do not use floor polish or wax that makes floors slippery. If you must use wax, use non-skid floor wax.  Remove throw rugs and tripping hazards from the floor. STAIRWAYS  Never leave objects on stairs.  Place handrails on both sides of stairways and use them. Fix any loose handrails. Make sure handrails on both sides of the stairways are as long as the stairs.  Check carpeting to make sure it is firmly attached along stairs. Make repairs to worn or loose carpet promptly.  Avoid placing throw rugs at the top or bottom of stairways, or properly secure the rug with carpet tape to prevent slippage. Get rid of throw rugs, if possible.  Have an electrician put in a light switch at the top and bottom of the stairs. OTHER FALL PREVENTION TIPS  Wear low-heel or rubber-soled shoes that are supportive and fit well. Wear closed toe shoes.  When using a stepladder, make sure it is fully opened and both spreaders are firmly locked. Do not climb a closed stepladder.  Add color or contrast paint or tape to grab bars and handrails in your home. Place contrasting color strips on first and last steps.  Learn and use mobility aids as needed. Install an electrical emergency response system.  Turn on lights to avoid dark areas. Replace light bulbs that burn out immediately. Get light switches that glow.  Arrange furniture to create clear pathways. Keep furniture in the same place.  Firmly attach carpet with non-skid or double-sided tape.  Eliminate uneven floor surfaces.  Select a carpet pattern that does not visually hide the edge of steps.  Be aware of all pets. OTHER HOME SAFETY TIPS  Set the water temperature for 120 F (48.8 C).  Keep emergency numbers on or near the telephone.  Keep smoke detectors on every level of the home and near sleeping  areas. Document Released: 11/16/2002 Document Revised: 05/27/2012 Document Reviewed: 02/15/2012 [facility] Patient Information 2015 [place], [place]. This information is not intended to replace advice given to you by your health care provider. Make sure you discuss any questions you have with your health care provider.    Preventive Care for Adults   Ages 74 years and over  Blood pressure check.** / Every 1 to 2 years.  Lipid and cholesterol check.** / Every 5 years beginning at age 60 years.  Lung cancer screening. / Every year if you are aged 31-80 years and have a 30-pack-year history of smoking and currently smoke or have quit within the past 15 years. Yearly screening is stopped once you have quit smoking for at least 15 years or develop a health problem that would prevent you from having lung cancer treatment.  Clinical breast exam.** / Every year after age 53 years.  BRCA-related cancer risk assessment.** / For women who have family members with a BRCA-related cancer (breast, ovarian, tubal, or peritoneal cancers).  Mammogram.** / Every year beginning at age 49 years and continuing for as long as you are in good health. Consult with your health care provider.  Pap test.** / Every 3 years starting at age 45 years through age 24 or 19 years with 3 consecutive normal Pap tests. Testing can be stopped between 65 and 70 years with 3 consecutive normal Pap tests and no abnormal Pap or HPV tests in the past 10 years.  HPV screening.** / Every 3 years from ages 44 years through ages 33 or 14 years with a history of 3 consecutive normal Pap tests. Testing can be stopped between 65 and 70 years with 3 consecutive normal Pap tests and no abnormal Pap or HPV tests in the past 10 years.  Fecal occult blood test (FOBT) of stool. / Every year beginning at age 10 years and continuing until age 93 years. You may not need to do this test if you get a colonoscopy every 10 years.  Flexible  sigmoidoscopy or colonoscopy.** / Every 5 years for a flexible sigmoidoscopy or every 10 years for a colonoscopy beginning at age 68 years and continuing until age 33 years.  Hepatitis C blood test.** / For all people born from [birth year] through 1965 and any individual with known risks for hepatitis C.  Osteoporosis screening.** / A one-time screening for women ages 78 years and over and women at risk for fractures or osteoporosis.  Skin self-exam. / Monthly.  Influenza vaccine. / Every year.  Tetanus, diphtheria, and acellular pertussis (Tdap/Td) vaccine.** / 1 dose of Td every 10 years.  Varicella vaccine.** / Consult your health care provider.  Zoster vaccine.** / 1 dose for adults aged 65 years or older.  Pneumococcal 13-valent conjugate (PCV13) vaccine.** / Consult your health care provider.  Pneumococcal polysaccharide (PPSV23) vaccine.** / 1 dose for all adults aged 3 years and  older.  Meningococcal vaccine.** / Consult your health care provider.  Hepatitis A vaccine.** / Consult your health care provider.  Hepatitis B vaccine.** / Consult your health care provider.  Haemophilus influenzae type b (Hib) vaccine.** / Consult your health care provider. ** Family history and personal history of risk and conditions may change your health care provider's recommendations. Document Released: 01/22/2002 Document Revised: 04/12/2014 Document Reviewed: 04/23/2011 [facility] Patient Information 2015 [place], [place]. This information is not intended to replace advice given to you by your health care provider. Make sure you discuss any questions you have with your health care provider.

## 2014-10-07 NOTE — Assessment & Plan Note (Signed)
Continue simvastatin, check a FLP

## 2014-10-08 ENCOUNTER — Other Ambulatory Visit: Payer: Self-pay

## 2014-10-08 ENCOUNTER — Other Ambulatory Visit: Payer: Self-pay | Admitting: Internal Medicine

## 2014-10-08 MED ORDER — SIMVASTATIN 40 MG PO TABS: ORAL_TABLET | ORAL | Status: DC

## 2014-10-08 MED ORDER — MONTELUKAST SODIUM 10 MG PO TABS: ORAL_TABLET | ORAL | Status: DC

## 2014-11-09 ENCOUNTER — Telehealth: Payer: Self-pay | Admitting: Hematology

## 2014-11-09 NOTE — Telephone Encounter (Signed)
, °

## 2014-11-26 ENCOUNTER — Encounter: Payer: Self-pay | Admitting: Neurology

## 2014-11-26 ENCOUNTER — Ambulatory Visit (INDEPENDENT_AMBULATORY_CARE_PROVIDER_SITE_OTHER): Payer: Medicare Other | Admitting: Neurology

## 2014-11-26 VITALS — BP 140/80 | HR 76 | Temp 98.7°F | Resp 18 | Ht 63.0 in | Wt 186.7 lb

## 2014-11-26 DIAGNOSIS — Z853 Personal history of malignant neoplasm of breast: Secondary | ICD-10-CM

## 2014-11-26 DIAGNOSIS — H532 Diplopia: Secondary | ICD-10-CM

## 2014-11-26 DIAGNOSIS — R2681 Unsteadiness on feet: Secondary | ICD-10-CM

## 2014-11-26 DIAGNOSIS — G4700 Insomnia, unspecified: Secondary | ICD-10-CM

## 2014-11-26 DIAGNOSIS — E119 Type 2 diabetes mellitus without complications: Secondary | ICD-10-CM

## 2014-11-26 DIAGNOSIS — G4710 Hypersomnia, unspecified: Secondary | ICD-10-CM

## 2014-11-26 DIAGNOSIS — G4730 Sleep apnea, unspecified: Secondary | ICD-10-CM

## 2014-11-26 NOTE — Patient Instructions (Addendum)
First, we will check MRI of the brain with and without contrast.  If unremarkable, will check some blood work.  Will contact you with results. [facility]  12/20/14 12:45pm

## 2014-11-26 NOTE — Progress Notes (Signed)
NEUROLOGY CONSULTATION NOTE  [name] MRN: [id number] DOB: [birth year]  Referring provider: Dr. Larose Kells Primary care provider: Dr. Larose Kells  Reason for consult:  Gait instability, diplopia  HISTORY OF PRESENT ILLNESS: [name] is a 73 year old right-handed woman with hypertension, type II diabetes mellitus, osteopenia, hyperlipidemia, and history of breast cancer with double mastectomy (bilateral 2006, left 2011 with radiation) who presents for diplopia and gait instability.  Records and labs reviewed.  Onset of symptoms started about a year ago.   She reports that she has difficulty walking a straight line.  She denies feeling of dizziness, vertigo, weakness or numbness in the feet.  She reports one fall in September which occurred while walking off a step out of the house.  Around the same time, she reports recurrent episodes of diplopia.  It only occurs when she is tired in church.  She reports horizontal double vision, which resolves when closing either eye.  It lasts only a second until she can blink or alert herself.  It doesn't occur when she is reading.  She denies trouble swallowing or neck weakness.  She reports hypersomnolence that typically only occurs when at church.  One time she fell asleep in a church meeting without having felt tired.  She does report trouble falling asleep.  She says she had a sleep study a few years ago and was told she had OSA.  It was advised by her pulmonologist to try using a CPAP.  Recent labs from October include Hgb A1c of 6.5.  TSH from March 2014 was 0.62.  PAST MEDICAL HISTORY: Past Medical History  Diagnosis Date  . Diabetes mellitus   . Hyperlipidemia   . Asthma     dx in adulthood (73 y/o) PFT 09/08/09 FEV1 1.70/81%; FEV1/FVC 0.75; +resp to dilartor; NI LV/DLCO  . Breast CA     hx of bilateral diagnosied in 2006, recurred 2011- [name]  . Colonic polyp   . Osteopenia   . Allergic rhinitis   . OSA  (obstructive sleep apnea)     CPAP intolerant  . Peripheral neuropathy   . COPD (chronic obstructive pulmonary disease)   . Chronic constipation   . HTN (hypertension) 11-2011    PAST SURGICAL HISTORY: Past Surgical History  Procedure Laterality Date  . Appendectomy    . Mastectomy  2006    bilateral  . Pilonidal cyst surgery      ? of  . Flexible sigmoidoscopy N/A 01/31/2013    Procedure: FLEXIBLE SIGMOIDOSCOPY;  Surgeon: [name], MD;  Location: [facility];  Service: Endoscopy;  Laterality: N/A;  unprepped with sedation    MEDICATIONS: Current Outpatient Prescriptions on File Prior to Visit  Medication Sig Dispense Refill  . albuterol (ACCUNEB) 0.63 MG/3ML nebulizer solution Take 1 ampule by nebulization 4 (four) times daily as needed.    Marland Kitchen aspirin 81 MG tablet Take 81 mg by mouth daily.     . beclomethasone (QVAR) 80 MCG/ACT inhaler Inhale 2 puffs into the lungs 2 (two) times daily. 3 Inhaler 3  . cholecalciferol (VITAMIN D) 1000 UNITS tablet Take 1,000 Units by mouth daily.    . diphenhydrAMINE (BENADRYL) 25 MG tablet Take 50 mg by mouth daily as needed. For swelling    . EPINEPHrine (EPI-PEN) 0.3 mg/0.3 mL DEVI Inject 0.3 mLs (0.3 mg total) into the muscle once. 1 Device 0  . glucose blood test strip Check blood sugar daily. DX code: 250.00 100 each 12  .  L-Methylfolate-B6-B12 (METANX PO) Take 1 tablet by mouth 2 (two) times daily.    Marland Kitchen letrozole (FEMARA) 2.5 MG tablet TAKE 1 TABLET (2.5 MG TOTAL) BY MOUTH DAILY. 30 tablet 3  . magnesium hydroxide (MILK OF MAGNESIA) 800 MG/5ML suspension Take 5 mLs by mouth every evening.      . metFORMIN (GLUCOPHAGE) 1000 MG tablet TAKE 1 TABLET EVERY DAY 90 tablet 1  . montelukast (SINGULAIR) 10 MG tablet TAKE 1 TABLET (10 MG TOTAL) BY MOUTH AT BEDTIME. 30 tablet 4  . Multiple Vitamin (MULTIVITAMIN) tablet Take 1 tablet by mouth daily.    . polyethylene glycol (MIRALAX / GLYCOLAX) packet Take 17 g by mouth daily. 30 each 0  .  simvastatin (ZOCOR) 40 MG tablet TAKE 1 TABLET BY MOUTH AT BEDTIME 30 tablet 5  . triamterene-hydrochlorothiazide (MAXZIDE-25) 37.5-25 MG per tablet TAKE 1 TABLET BY MOUTH DAILY 30 tablet 6  . VENTOLIN HFA 108 (90 BASE) MCG/ACT inhaler INHALE 2 PUFFS INTO THE LUNGS EVERY 4 HOURS AS NEEDED FOR WHEEZING OR SHORTNESS OF BREATH 18 each 5  . [DISCONTINUED] potassium chloride (K-DUR) 10 MEQ tablet Take 1 tablet (10 mEq total) by mouth daily. 30 tablet 0   No current facility-administered medications on file prior to visit.    ALLERGIES: Allergies  Allergen Reactions  . Food     nuts  . Peanut-Containing Drug Products   . Penicillins Other (See Comments)    REACTION: outer rectal itching    FAMILY HISTORY: Family History  Problem Relation Age of Onset  . Breast cancer Other     GM,sister  . Colon cancer Neg Hx   . Diabetes Brother   . Heart attack Neg Hx     no h/o early diseae  . COPD Mother     smoker  . Hypertension Mother   . Lung cancer Father     died age 45  . Cancer Maternal Grandmother     breast     SOCIAL HISTORY: History   Social History  . Marital Status: Widowed    Spouse Name: N/A    Number of Children: 2  . Years of Education: N/A   Occupational History  . runs a non-profit    Social History Main Topics  . Smoking status: Former Smoker -- 1.00 packs/day for 4 years    Types: Cigarettes    Quit date: 04/01/1968  . Smokeless tobacco: Never Used     Comment: Quit at age 74  . Alcohol Use: No  . Drug Use: No  . Sexual Activity: Not Currently   Other Topics Concern  . Not on file   Social History Narrative   Widowed last husband 4-10, lives alone, retired Pharmacist, hospital   2 children, [place] twins      REVIEW OF SYSTEMS: Constitutional: No fevers, chills, or sweats, no generalized fatigue, change in appetite Eyes: No visual changes, double vision, eye pain Ear, nose and throat: No hearing loss, ear pain, nasal congestion, sore throat Cardiovascular: No  chest pain, palpitations Respiratory:  No shortness of breath at rest or with exertion, wheezes GastrointestinaI: No nausea, vomiting, diarrhea, abdominal pain, fecal incontinence Genitourinary:  No dysuria, urinary retention or frequency Musculoskeletal:  No neck pain, back pain Integumentary: No rash, pruritus, skin lesions Neurological: as above Psychiatric: No depression, insomnia, anxiety Endocrine: No palpitations, fatigue, diaphoresis, mood swings, change in appetite, change in weight, increased thirst Hematologic/Lymphatic:  No anemia, purpura, petechiae. Allergic/Immunologic: no itchy/runny eyes, nasal congestion, recent allergic reactions, rashes  PHYSICAL EXAM: Filed Vitals:   11/26/14 1007  BP: 140/80  Pulse: 76  Temp: 98.7 F (37.1 C)  Resp: 18   General: No acute distress Head:  Normocephalic/atraumatic Eyes:  fundi unremarkable, without vessel changes, exudates, hemorrhages or papilledema. Neck: supple, no paraspinal tenderness, full range of motion Back: No paraspinal tenderness Heart: regular rate and rhythm Lungs: Clear to auscultation bilaterally. Vascular: No carotid bruits. Neurological Exam: Mental status: alert and oriented to person, place, and time, recent and remote memory intact, fund of knowledge intact, attention and concentration intact, speech fluent and not dysarthric, language intact. Cranial nerves: CN I: not tested CN II: pupils equal, round and reactive to light, visual fields intact, fundi unremarkable, without vessel changes, exudates, hemorrhages or papilledema. CN III, IV, VI:  full range of motion but with saccadic movements while tracking, no nystagmus, no ptosis CN V: facial sensation intact CN VII: upper and lower face symmetric CN VIII: hearing intact CN IX, X: gag intact, uvula midline CN XI: sternocleidomastoid and trapezius muscles intact CN XII: tongue midline Bulk & Tone: normal, no fasciculations. Motor:  5/5  throughout Sensation:  Pinprick sensation intact.  Vibration sensation mildly reduced in the feet. Deep Tendon Reflexes:  2+ throughout except absent in the ankles.  Toes downgoing. Finger to nose testing:  No dysmetria Heel to shin:  No dysmetria Gait:  Mildly wide-based gait.  Subtle veering to either side.  Able to turn in 3 steps without difficulty.  Difficulty with tandem walking. Romberg with only mild sway.  IMPRESSION: Gait instability.  Could be intracranial or due to neuropathy.  Cervical myelopathy is considered but less likely given that she has no UMN signs on exam. Transient recurrent horizontal diplopia Insomnia/hypersomnia Type II diabetes mellitus History of breast cancer  PLAN: We will take a step-wise approach to workup. 1.  First, we will check MRI of brain with and without contrast, given history of breast cancer to evaluate for both diplopia and gait instability 2.  If unremarkable, would check TSH and Myasthenia panel to evaluate transient diplopia 3.  Consider NCV-EMG to assess degree of neuropathy.  Her diabetes have been controlled, so worsening neuropathy shouldn't be the problem. 4.  Provided patient with sheet to improve sleep. 5.  Advised to discuss with Dr. Annamaria Boots regarding her daytime sleepiness, as she is concerned about this.   Thank you for allowing me to take part in the care of this patient.  Metta Clines, DO  CC:  Kathlene November, MD  Baird Lyons, MD

## 2014-11-27 LAB — CREATININE, SERUM: Creat: 0.81 mg/dL (ref 0.50–1.10)

## 2014-11-27 LAB — BUN: BUN: 18 mg/dL (ref 6–23)

## 2014-12-10 DIAGNOSIS — S53104A Unspecified dislocation of right ulnohumeral joint, initial encounter: Secondary | ICD-10-CM

## 2014-12-10 HISTORY — PX: ELBOW SURGERY: SHX618

## 2014-12-10 HISTORY — DX: Unspecified dislocation of right ulnohumeral joint, initial encounter: S53.104A

## 2014-12-20 ENCOUNTER — Ambulatory Visit (HOSPITAL_COMMUNITY)
Admission: RE | Admit: 2014-12-20 | Discharge: 2014-12-20 | Disposition: A | Discharge status: Home / Self Care | Payer: Medicare Other | Source: Ambulatory Visit | Attending: Neurology | Admitting: Neurology

## 2014-12-20 DIAGNOSIS — H532 Diplopia: Secondary | ICD-10-CM | POA: Diagnosis not present

## 2014-12-20 DIAGNOSIS — Z8673 Personal history of transient ischemic attack (TIA), and cerebral infarction without residual deficits: Secondary | ICD-10-CM | POA: Insufficient documentation

## 2014-12-20 DIAGNOSIS — E119 Type 2 diabetes mellitus without complications: Secondary | ICD-10-CM | POA: Diagnosis not present

## 2014-12-20 DIAGNOSIS — Z853 Personal history of malignant neoplasm of breast: Secondary | ICD-10-CM | POA: Insufficient documentation

## 2014-12-20 DIAGNOSIS — R9082 White matter disease, unspecified: Secondary | ICD-10-CM | POA: Insufficient documentation

## 2014-12-20 MED ORDER — GADOBENATE DIMEGLUMINE 529 MG/ML IV SOLN
20.0000 mL | Freq: Once | INTRAVENOUS | Status: AC | PRN
  Administered 2014-12-20: 20 mL via INTRAVENOUS

## 2014-12-21 ENCOUNTER — Other Ambulatory Visit: Payer: Self-pay | Admitting: *Deleted

## 2014-12-21 ENCOUNTER — Other Ambulatory Visit: Payer: Self-pay | Admitting: Neurology

## 2014-12-21 ENCOUNTER — Telehealth: Payer: Self-pay | Admitting: *Deleted

## 2014-12-21 DIAGNOSIS — H532 Diplopia: Secondary | ICD-10-CM

## 2014-12-21 LAB — TSH: TSH: 0.752 u[IU]/mL (ref 0.350–4.500)

## 2014-12-21 NOTE — Telephone Encounter (Signed)
Patient is  aware of MRI  results and labs have been ordered she will pickl up slip today

## 2014-12-21 NOTE — Telephone Encounter (Signed)
-----   Message from Dudley Major, DO sent at 12/20/2014  2:43 PM EST ----- MRI of brain shows chronic changes related to stroke risk factors (such as diabetes).  However, there is nothing that would explain her symptoms of double vision.  I would like to check TSH and myasthenia panel for transient diplopia. ----- Message -----    From: Rad Results In Interface    Sent: 12/20/2014   2:01 PM      To: Dudley Major, DO

## 2014-12-23 LAB — ACETYLCHOLINE RECEPTOR, BINDING

## 2014-12-28 ENCOUNTER — Telehealth: Payer: Self-pay | Admitting: Neurology

## 2014-12-28 ENCOUNTER — Telehealth: Payer: Self-pay | Admitting: *Deleted

## 2014-12-28 LAB — STRIATED MUSCLE ANTIBODY

## 2014-12-28 NOTE — Telephone Encounter (Signed)
Patient is aware of  MRI Brain results labs have not been released as of yet. I explained to patient that as soon as they are resulted to me I will call her with results

## 2014-12-28 NOTE — Telephone Encounter (Signed)
Pt wants a the results of the MRI and blood work please call [phone]

## 2014-12-30 ENCOUNTER — Telehealth: Payer: Self-pay | Admitting: *Deleted

## 2014-12-30 NOTE — Telephone Encounter (Signed)
patient is aware of labs and MRI she would like to wait a bit before being referred to another Dr and see if it resolves on its on

## 2014-12-30 NOTE — Telephone Encounter (Signed)
-----   Message from Dudley Major, DO sent at 12/28/2014 12:44 PM EST ----- MRI and labs looking for cause of double vision are negative.  At this point, I don't have an explanation.  We can monitor for now, or if she wants, we can refer her to a neuro-ophthalmologist at [facility] ----- Message -----    From: Lab in [place]: 12/28/2014  10:55 AM      To: Dudley Major, DO

## 2015-01-15 ENCOUNTER — Encounter (HOSPITAL_COMMUNITY): Payer: Self-pay | Admitting: *Deleted

## 2015-01-15 ENCOUNTER — Emergency Department (HOSPITAL_COMMUNITY): Payer: Medicare Other

## 2015-01-15 ENCOUNTER — Emergency Department (HOSPITAL_COMMUNITY)
Admission: EM | Admit: 2015-01-15 | Discharge: 2015-01-15 | Disposition: A | Discharge status: Home / Self Care | Payer: Medicare Other | Attending: Emergency Medicine | Admitting: Emergency Medicine

## 2015-01-15 DIAGNOSIS — Z7982 Long term (current) use of aspirin: Secondary | ICD-10-CM | POA: Diagnosis not present

## 2015-01-15 DIAGNOSIS — M8580 Other specified disorders of bone density and structure, unspecified site: Secondary | ICD-10-CM | POA: Diagnosis not present

## 2015-01-15 DIAGNOSIS — R519 Headache, unspecified: Secondary | ICD-10-CM

## 2015-01-15 DIAGNOSIS — Z9981 Dependence on supplemental oxygen: Secondary | ICD-10-CM | POA: Diagnosis not present

## 2015-01-15 DIAGNOSIS — Z8669 Personal history of other diseases of the nervous system and sense organs: Secondary | ICD-10-CM | POA: Insufficient documentation

## 2015-01-15 DIAGNOSIS — G4733 Obstructive sleep apnea (adult) (pediatric): Secondary | ICD-10-CM | POA: Diagnosis not present

## 2015-01-15 DIAGNOSIS — Z87891 Personal history of nicotine dependence: Secondary | ICD-10-CM | POA: Insufficient documentation

## 2015-01-15 DIAGNOSIS — S199XXA Unspecified injury of neck, initial encounter: Secondary | ICD-10-CM | POA: Diagnosis not present

## 2015-01-15 DIAGNOSIS — Z79899 Other long term (current) drug therapy: Secondary | ICD-10-CM | POA: Diagnosis not present

## 2015-01-15 DIAGNOSIS — J449 Chronic obstructive pulmonary disease, unspecified: Secondary | ICD-10-CM | POA: Diagnosis not present

## 2015-01-15 DIAGNOSIS — S3991XA Unspecified injury of abdomen, initial encounter: Secondary | ICD-10-CM | POA: Insufficient documentation

## 2015-01-15 DIAGNOSIS — E785 Hyperlipidemia, unspecified: Secondary | ICD-10-CM | POA: Insufficient documentation

## 2015-01-15 DIAGNOSIS — Z7951 Long term (current) use of inhaled steroids: Secondary | ICD-10-CM | POA: Insufficient documentation

## 2015-01-15 DIAGNOSIS — J45909 Unspecified asthma, uncomplicated: Secondary | ICD-10-CM | POA: Diagnosis not present

## 2015-01-15 DIAGNOSIS — S0990XA Unspecified injury of head, initial encounter: Secondary | ICD-10-CM | POA: Diagnosis present

## 2015-01-15 DIAGNOSIS — I10 Essential (primary) hypertension: Secondary | ICD-10-CM | POA: Diagnosis not present

## 2015-01-15 DIAGNOSIS — Y998 Other external cause status: Secondary | ICD-10-CM | POA: Diagnosis not present

## 2015-01-15 DIAGNOSIS — E119 Type 2 diabetes mellitus without complications: Secondary | ICD-10-CM | POA: Diagnosis not present

## 2015-01-15 DIAGNOSIS — Z853 Personal history of malignant neoplasm of breast: Secondary | ICD-10-CM | POA: Diagnosis not present

## 2015-01-15 DIAGNOSIS — Y92410 Unspecified street and highway as the place of occurrence of the external cause: Secondary | ICD-10-CM | POA: Insufficient documentation

## 2015-01-15 DIAGNOSIS — K5900 Constipation, unspecified: Secondary | ICD-10-CM | POA: Diagnosis not present

## 2015-01-15 DIAGNOSIS — Y9389 Activity, other specified: Secondary | ICD-10-CM | POA: Insufficient documentation

## 2015-01-15 DIAGNOSIS — R51 Headache: Secondary | ICD-10-CM

## 2015-01-15 LAB — CBC
HEMATOCRIT: 41.2 % (ref 36.0–46.0)
Hemoglobin: 13.8 g/dL (ref 12.0–15.0)
MCH: 30.1 pg (ref 26.0–34.0)
MCHC: 33.5 g/dL (ref 30.0–36.0)
MCV: 89.8 fL (ref 78.0–100.0)
Platelets: 284 10*3/uL (ref 150–400)
RBC: 4.59 MIL/uL (ref 3.87–5.11)
RDW: 13.3 % (ref 11.5–15.5)
WBC: 12.4 10*3/uL — AB (ref 4.0–10.5)

## 2015-01-15 LAB — COMPREHENSIVE METABOLIC PANEL
ALT: 22 U/L (ref 0–35)
AST: 49 U/L — ABNORMAL HIGH (ref 0–37)
Albumin: 4 g/dL (ref 3.5–5.2)
Alkaline Phosphatase: 69 U/L (ref 39–117)
Anion gap: 9 (ref 5–15)
BUN: 19 mg/dL (ref 6–23)
CO2: 27 mmol/L (ref 19–32)
Calcium: 9.4 mg/dL (ref 8.4–10.5)
Chloride: 98 mmol/L (ref 96–112)
Creatinine, Ser: 0.85 mg/dL (ref 0.50–1.10)
GFR calc Af Amer: 77 mL/min — ABNORMAL LOW (ref >90)
GFR, EST NON AFRICAN AMERICAN: 67 mL/min — AB (ref >90)
GLUCOSE: 93 mg/dL (ref 70–99)
POTASSIUM: 4.2 mmol/L (ref 3.5–5.1)
Sodium: 134 mmol/L — ABNORMAL LOW (ref 135–145)
TOTAL PROTEIN: 7 g/dL (ref 6.0–8.3)
Total Bilirubin: 1.5 mg/dL — ABNORMAL HIGH (ref 0.3–1.2)

## 2015-01-15 MED ORDER — CYCLOBENZAPRINE HCL 10 MG PO TABS: 10.0000 mg | ORAL_TABLET | Freq: Two times a day (BID) | ORAL | Status: DC | PRN

## 2015-01-15 MED ORDER — HYDROCODONE-ACETAMINOPHEN 5-325 MG PO TABS: 1.0000 | ORAL_TABLET | Freq: Four times a day (QID) | ORAL | Status: DC | PRN

## 2015-01-15 MED ORDER — IOHEXOL 300 MG/ML  SOLN
100.0000 mL | Freq: Once | INTRAMUSCULAR | Status: AC | PRN
  Administered 2015-01-15: 100 mL via INTRAVENOUS

## 2015-01-15 MED ORDER — HYDROMORPHONE HCL 1 MG/ML IJ SOLN
1.0000 mg | Freq: Once | INTRAMUSCULAR | Status: AC
  Administered 2015-01-15: 1 mg via INTRAVENOUS
  Filled 2015-01-15: qty 1

## 2015-01-15 MED ORDER — SODIUM CHLORIDE 0.9 % IV BOLUS (SEPSIS)
1000.0000 mL | Freq: Once | INTRAVENOUS | Status: AC
  Administered 2015-01-15: 1000 mL via INTRAVENOUS

## 2015-01-15 NOTE — Discharge Instructions (Signed)

## 2015-01-15 NOTE — ED Notes (Signed)
Still in Radiology
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
## 2015-01-15 NOTE — ED Notes (Signed)
MD at bedside. 
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
## 2015-01-15 NOTE — ED Notes (Signed)
Patient transported to CT 

## 2015-01-15 NOTE — ED Notes (Signed)
Pt made aware to return if symptoms worsen or if any life threatening symptoms occur.   

## 2015-01-15 NOTE — ED Notes (Signed)
Patient returned from Radiology. 

## 2015-01-16 MED ORDER — HYDROCODONE-ACETAMINOPHEN 5-325 MG PO TABS: 1.0000 | ORAL_TABLET | Freq: Four times a day (QID) | ORAL | Status: DC | PRN

## 2015-01-16 MED ORDER — CYCLOBENZAPRINE HCL 10 MG PO TABS: 10.0000 mg | ORAL_TABLET | Freq: Two times a day (BID) | ORAL | Status: DC | PRN
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
## 2015-01-17 ENCOUNTER — Telehealth: Payer: Self-pay | Admitting: Internal Medicine

## 2015-01-17 NOTE — Telephone Encounter (Signed)
Spoke with pt.  C/o sore throat, chest congestion, dry cough, chest tightness, pressure in ears, and increased SOB x few days.   Using Albuterol HFA with Relief.   Requesting OV - unable to come in tomorrow d/t transportation issues. OV scheduled for Wednesday, Feb 10 at 9:45 am with Dr. Annamaria Boots. Pt confirmed appt and is to seek emergency care if needed.

## 2015-01-19 ENCOUNTER — Encounter: Payer: Self-pay | Admitting: Internal Medicine

## 2015-01-19 ENCOUNTER — Ambulatory Visit (INDEPENDENT_AMBULATORY_CARE_PROVIDER_SITE_OTHER): Payer: Medicare Other | Admitting: Internal Medicine

## 2015-01-19 VITALS — BP 126/80 | HR 105 | Ht 63.0 in | Wt 189.6 lb

## 2015-01-19 DIAGNOSIS — H109 Unspecified conjunctivitis: Secondary | ICD-10-CM

## 2015-01-19 DIAGNOSIS — J45998 Other asthma: Secondary | ICD-10-CM

## 2015-01-19 MED ORDER — TOBRAMYCIN 0.3 % OP SOLN: 2.0000 [drp] | Freq: Four times a day (QID) | OPHTHALMIC | Status: DC

## 2015-01-19 MED ORDER — AZITHROMYCIN 250 MG PO TABS: ORAL_TABLET | ORAL | Status: DC

## 2015-01-19 NOTE — Patient Instructions (Addendum)
Extra fluids, avoid chills  An otc earwax kit might help open your Right ear  Script for Zpak sent  Ok to keep April appointment    Please call if needed
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
## 2015-01-19 NOTE — Progress Notes (Signed)
Pre visit review using our clinic review tool, if applicable. No additional management support is needed unless otherwise documented below in the visit note. 
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
## 2015-01-23 NOTE — Assessment & Plan Note (Signed)
Mild exacerbation as part of an upper respiratory infection with bronchitis Plan-manages viral with supportive care, fluids, refill EpiPen

## 2015-02-07 ENCOUNTER — Ambulatory Visit (INDEPENDENT_AMBULATORY_CARE_PROVIDER_SITE_OTHER): Payer: Medicare Other | Admitting: Internal Medicine

## 2015-02-07 ENCOUNTER — Encounter: Payer: Self-pay | Admitting: Internal Medicine

## 2015-02-07 VITALS — BP 122/78 | HR 77 | Temp 97.7°F | Ht 63.0 in | Wt 177.5 lb

## 2015-02-07 DIAGNOSIS — E119 Type 2 diabetes mellitus without complications: Secondary | ICD-10-CM

## 2015-02-07 DIAGNOSIS — L509 Urticaria, unspecified: Secondary | ICD-10-CM

## 2015-02-07 DIAGNOSIS — E118 Type 2 diabetes mellitus with unspecified complications: Secondary | ICD-10-CM

## 2015-02-07 DIAGNOSIS — H532 Diplopia: Secondary | ICD-10-CM

## 2015-02-07 LAB — HEMOGLOBIN A1C: Hgb A1c MFr Bld: 6.6 % — ABNORMAL HIGH (ref 4.6–6.5)

## 2015-02-07 LAB — AST: AST: 20 U/L (ref 0–37)

## 2015-02-07 LAB — ALT: ALT: 16 U/L (ref 0–35)

## 2015-02-07 MED ORDER — EPINEPHRINE 0.3 MG/0.3ML IJ SOAJ: 0.3000 mg | Freq: Once | INTRAMUSCULAR | Status: DC

## 2015-02-07 NOTE — Progress Notes (Signed)
Pre visit review using our clinic review tool, if applicable. No additional management support is needed unless otherwise documented below in the visit note. 
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
## 2015-02-07 NOTE — Assessment & Plan Note (Signed)
Refilled Epi Pen

## 2015-02-07 NOTE — Assessment & Plan Note (Addendum)
Good compliance with metformin, check A1c, AST, ALT

## 2015-02-07 NOTE — Assessment & Plan Note (Addendum)
Saw  neurology to 11/2014, MRI of the brain 12-2014 negative, additional blood work negative. They are considering observation versus refer to  [facility].  patient plans to contact them and discuss the issue. In the meantime she has seen a ophthalmologist.

## 2015-02-07 NOTE — Patient Instructions (Signed)
Get your blood work before you leave    Please go to the front and schedule a visit  In 4 months for  a checkup

## 2015-02-22 ENCOUNTER — Encounter: Payer: Self-pay | Admitting: Neurology

## 2015-02-22 ENCOUNTER — Ambulatory Visit (INDEPENDENT_AMBULATORY_CARE_PROVIDER_SITE_OTHER): Payer: Medicare Other | Admitting: Neurology

## 2015-02-22 VITALS — BP 118/70 | HR 83 | Ht 63.0 in | Wt 172.1 lb

## 2015-02-22 DIAGNOSIS — E119 Type 2 diabetes mellitus without complications: Secondary | ICD-10-CM

## 2015-02-22 DIAGNOSIS — H532 Diplopia: Secondary | ICD-10-CM

## 2015-02-22 DIAGNOSIS — R2681 Unsteadiness on feet: Secondary | ICD-10-CM

## 2015-02-22 NOTE — Patient Instructions (Signed)
I can't find a specific neurologic cause for the double vision.  It is possible that it is related to diabetes.  I would follow up with the eye doctor.  Please follow up as needed.
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
## 2015-02-24 ENCOUNTER — Other Ambulatory Visit: Payer: Self-pay | Admitting: Internal Medicine

## 2015-03-01 ENCOUNTER — Other Ambulatory Visit: Payer: Self-pay | Admitting: Internal Medicine

## 2015-03-12 ENCOUNTER — Other Ambulatory Visit: Payer: Self-pay | Admitting: Internal Medicine

## 2015-03-14 ENCOUNTER — Other Ambulatory Visit: Payer: Self-pay | Admitting: Internal Medicine

## 2015-03-15 ENCOUNTER — Other Ambulatory Visit: Payer: Self-pay | Admitting: Hematology

## 2015-03-15 DIAGNOSIS — Z853 Personal history of malignant neoplasm of breast: Secondary | ICD-10-CM

## 2015-03-16 ENCOUNTER — Ambulatory Visit (HOSPITAL_BASED_OUTPATIENT_CLINIC_OR_DEPARTMENT_OTHER): Payer: Medicare Other | Admitting: Hematology

## 2015-03-16 ENCOUNTER — Encounter: Payer: Self-pay | Admitting: Hematology

## 2015-03-16 ENCOUNTER — Other Ambulatory Visit (HOSPITAL_BASED_OUTPATIENT_CLINIC_OR_DEPARTMENT_OTHER): Payer: Medicare Other

## 2015-03-16 ENCOUNTER — Telehealth: Payer: Self-pay | Admitting: Hematology

## 2015-03-16 VITALS — BP 114/71 | HR 77 | Temp 98.8°F | Resp 18 | Ht 63.0 in | Wt 173.9 lb

## 2015-03-16 DIAGNOSIS — Z853 Personal history of malignant neoplasm of breast: Secondary | ICD-10-CM

## 2015-03-16 DIAGNOSIS — C50912 Malignant neoplasm of unspecified site of left female breast: Secondary | ICD-10-CM | POA: Insufficient documentation

## 2015-03-16 DIAGNOSIS — D0511 Intraductal carcinoma in situ of right breast: Secondary | ICD-10-CM

## 2015-03-16 DIAGNOSIS — M8580 Other specified disorders of bone density and structure, unspecified site: Secondary | ICD-10-CM

## 2015-03-16 DIAGNOSIS — Z170 Estrogen receptor positive status [ER+]: Secondary | ICD-10-CM | POA: Diagnosis not present

## 2015-03-16 LAB — COMPREHENSIVE METABOLIC PANEL (CC13)
ALBUMIN: 4.1 g/dL (ref 3.5–5.0)
ALK PHOS: 68 U/L (ref 40–150)
ALT: 16 U/L (ref 0–55)
AST: 17 U/L (ref 5–34)
Anion Gap: 14 mEq/L — ABNORMAL HIGH (ref 3–11)
BILIRUBIN TOTAL: 0.57 mg/dL (ref 0.20–1.20)
BUN: 12.4 mg/dL (ref 7.0–26.0)
CO2: 27 mEq/L (ref 22–29)
Calcium: 10.7 mg/dL — ABNORMAL HIGH (ref 8.4–10.4)
Chloride: 98 mEq/L (ref 98–109)
Creatinine: 0.9 mg/dL (ref 0.6–1.1)
EGFR: 79 mL/min/{1.73_m2} — ABNORMAL LOW (ref >90)
GLUCOSE: 112 mg/dL (ref 70–140)
Potassium: 3.7 mEq/L (ref 3.5–5.1)
Sodium: 140 mEq/L (ref 136–145)
Total Protein: 7.5 g/dL (ref 6.4–8.3)

## 2015-03-16 LAB — CBC WITH DIFFERENTIAL/PLATELET
BASO%: 0.7 % (ref 0.0–2.0)
Basophils Absolute: 0.1 10*3/uL (ref 0.0–0.1)
EOS ABS: 0.2 10*3/uL (ref 0.0–0.5)
EOS%: 2.3 % (ref 0.0–7.0)
HCT: 41.2 % (ref 34.8–46.6)
HGB: 13.3 g/dL (ref 11.6–15.9)
LYMPH%: 8.1 % — AB (ref 14.0–49.7)
MCH: 29 pg (ref 25.1–34.0)
MCHC: 32.2 g/dL (ref 31.5–36.0)
MCV: 90.1 fL (ref 79.5–101.0)
MONO#: 0.7 10*3/uL (ref 0.1–0.9)
MONO%: 6.5 % (ref 0.0–14.0)
NEUT#: 9.1 10*3/uL — ABNORMAL HIGH (ref 1.5–6.5)
NEUT%: 82.4 % — ABNORMAL HIGH (ref 38.4–76.8)
PLATELETS: 346 10*3/uL (ref 145–400)
RBC: 4.57 10*6/uL (ref 3.70–5.45)
RDW: 14.8 % — AB (ref 11.2–14.5)
WBC: 11 10*3/uL — ABNORMAL HIGH (ref 3.9–10.3)
lymph#: 0.9 10*3/uL (ref 0.9–3.3)

## 2015-03-16 MED ORDER — LETROZOLE 2.5 MG PO TABS: ORAL_TABLET | ORAL | Status: DC

## 2015-03-16 NOTE — Progress Notes (Signed)
Hematology and Oncology Follow Up Visit  [name] [id number] [birth year] 74 y.o. 03/16/2015 11:22 AM     Principle Diagnosis:[name] 74 y.o. 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     Prior Therapy: 1. Patient underwent a screening mammogram and abnormalities were detected in her breasts. Biopsy was positive for invasive cancer. She underwent a bilateral mastectomy on 01/30/05 by Dr. Excell Seltzer. 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   2. Patient later had recurrence along the left mastectomy site of invasive ductal carcinoma, lymph nodes were negative and it was excised on 04/18/10 with negative margins, she underwent radiation therapy, followed by adjuvant Letrozole   Current therapy:  Letrozole daily  Interim History: [name] 74 y.o. female with h/o invasive ductal carcinoma who is here today for f/u and evaluation. She was last seen by [facility] practitioner [name], who has left the practice. She is doing well today.  She takes letrozole daily, and tolerates the very well. She has mild intermittent hot flash, no complaints of other symptoms. She has good appetite and energy level. She takes vitamin D 3000units once daily, not on calcium supplement.  Past Medical History  Diagnosis Date  . Diabetes mellitus   . Hyperlipidemia   . Asthma     dx in adulthood (74 y/o) PFT 09/08/09 FEV1 1.70/81%; FEV1/FVC 0.75; +resp to dilartor; NI LV/DLCO  . Breast CA     hx of bilateral diagnosied in 2006, recurred 2011- [name]  . Colonic polyp   . Osteopenia   . Allergic rhinitis   . OSA (obstructive sleep apnea)     CPAP intolerant  . Peripheral neuropathy   . COPD (chronic obstructive pulmonary disease)   . Chronic constipation   . HTN  (hypertension) 11-2011   Medications:  Current Outpatient Prescriptions  Medication Sig Dispense Refill  . albuterol (ACCUNEB) 0.63 MG/3ML nebulizer solution Take 1 ampule by nebulization 4 (four) times daily as needed for shortness of breath.     Marland Kitchen aspirin 81 MG tablet Take 81 mg by mouth daily.     . beclomethasone (QVAR) 80 MCG/ACT inhaler Inhale 2 puffs into the lungs 2 (two) times daily. 3 Inhaler 3  . cyclobenzaprine (FLEXERIL) 10 MG tablet Take 1 tablet (10 mg total) by mouth 2 (two) times daily as needed for muscle spasms. 20 tablet 0  . diphenhydrAMINE (BENADRYL) 25 MG tablet Take 50 mg by mouth daily as needed. For swelling    . glucose blood test strip Check blood sugar daily. DX code: 250.00 100 each 12  . ibuprofen (ADVIL,MOTRIN) 200 MG tablet Take 200 mg by mouth every 6 (six) hours as needed for moderate pain.    Marland Kitchen letrozole (FEMARA) 2.5 MG tablet TAKE 1 TABLET (2.5 MG TOTAL) BY MOUTH DAILY. 30 tablet 3  . loratadine (CLARITIN) 10 MG tablet Take 10 mg by mouth daily as needed.     . magnesium hydroxide (MILK OF MAGNESIA) 800 MG/5ML suspension Take 5 mLs by mouth every evening.      . metFORMIN (GLUCOPHAGE) 1000 MG tablet TAKE 1 TABLET EVERY DAY 90 tablet 1  . montelukast (SINGULAIR) 10 MG tablet Take 1 tablet (10 mg total) by mouth at bedtime. 30 tablet 3  . Multiple Vitamin (MULTIVITAMIN) tablet Take 1 tablet by mouth daily.    Marland Kitchen  simvastatin (ZOCOR) 40 MG tablet TAKE 1 TABLET BY MOUTH AT BEDTIME 30 tablet 5  . triamterene-hydrochlorothiazide (MAXZIDE-25) 37.5-25 MG per tablet TAKE 1 TABLET BY MOUTH DAILY 30 tablet 6  . VENTOLIN HFA 108 (90 BASE) MCG/ACT inhaler INHALE 2 PUFFS INTO THE LUNGS EVERY 4 HOURS AS NEEDED FOR WHEEZING OR SHORTNESS OF BREATH 18 each 5  . EPINEPHrine 0.3 mg/0.3 mL IJ SOAJ injection Inject 0.3 mLs (0.3 mg total) into the muscle once. (Patient not taking: Reported on 03/16/2015) 1 Device 0  . [DISCONTINUED] potassium chloride (K-DUR) 10 MEQ tablet Take 1  tablet (10 mEq total) by mouth daily. 30 tablet 0   No current facility-administered medications for this visit.     Allergies:  Allergies  Allergen Reactions  . Food     nuts  . Peanut-Containing Drug Products     Nuts  . Penicillins Other (See Comments)    REACTION: outer rectal itching    Medical History: Past Medical History  Diagnosis Date  . Diabetes mellitus   . Hyperlipidemia   . Asthma     dx in adulthood (74 y/o) PFT 09/08/09 FEV1 1.70/81%; FEV1/FVC 0.75; +resp to dilartor; NI LV/DLCO  . Breast CA     hx of bilateral diagnosied in 2006, recurred 2011- [name]  . Colonic polyp   . Osteopenia   . Allergic rhinitis   . OSA (obstructive sleep apnea)     CPAP intolerant  . Peripheral neuropathy   . COPD (chronic obstructive pulmonary disease)   . Chronic constipation   . HTN (hypertension) 11-2011    Surgical History:  Past Surgical History  Procedure Laterality Date  . Appendectomy    . Mastectomy  2006    bilateral  . Pilonidal cyst surgery      ? of  . Flexible sigmoidoscopy N/A 01/31/2013    Procedure: FLEXIBLE SIGMOIDOSCOPY;  Surgeon: [name], MD;  Location: [facility];  Service: Endoscopy;  Laterality: N/A;  unprepped with sedation  . Mastectomy       Review of Systems: A 10 point review of systems was conducted and is otherwise negative except for what is noted above.    HEALTH MAINTENANCE:  Mammogram 05/06/12, s/p bialteral mastectomy Colonoscopy 12/10/04, flex sig on 01/2013  Bone Density 05/25/2014, osteopenia   Physical Exam: Blood pressure 114/71, pulse 77, temperature 98.8 F (37.1 C), temperature source Oral, resp. rate 18, height $RemoveBe'5\' 3"'[id number]$  (1.6 m), weight 173 lb 14.4 oz (78.881 kg). GENERAL: Patient is a well appearing female in no acute distress HEENT:  Sclerae anicteric.  Oropharynx clear and moist. No ulcerations or evidence of oropharyngeal candidiasis. Neck is supple.  NODES:  No cervical, supraclavicular,  or axillary lymphadenopathy palpated.  BREAST EXAM: s/p bilateral mastectomy, no nodularity, no sign of recurrence.   LUNGS:  Clear to auscultation bilaterally.  No wheezes or rhonchi. HEART:  Regular rate and rhythm. No murmur appreciated. ABDOMEN:  Soft, nontender.  Positive, normoactive bowel sounds. No organomegaly palpated. MSK:  No focal spinal tenderness to palpation. She did have difficulty with range of motion in her left shoulder.  Full range of motion in right upper extremity. EXTREMITIES:  No peripheral edema.   SKIN:  Clear with no obvious rashes or skin changes. No nail dyscrasia. NEURO:  Nonfocal. Well oriented.  Appropriate affect. ECOG PERFORMANCE STATUS: 1 - Symptomatic but completely ambulatory   Lab Results: CBC Latest Ref Rng 03/16/2015 01/15/2015 10/05/2014  WBC 3.9 - 10.3 10e3/uL 11.0(H) 12.4(H) 12.2(H)  Hemoglobin 11.6 - 15.9 g/dL 13.3 13.8 14.3  Hematocrit 34.8 - 46.6 % 41.2 41.2 44.5  Platelets 145 - 400 10e3/uL 346 284 350    CMP Latest Ref Rng 03/16/2015 02/07/2015 01/15/2015  Glucose 70 - 140 mg/dl 112 - 93  BUN 7.0 - 26.0 mg/dL 12.4 - 19  Creatinine 0.6 - 1.1 mg/dL 0.9 - 0.85  Sodium 136 - 145 mEq/L 140 - 134(L)  Potassium 3.5 - 5.1 mEq/L 3.7 - 4.2  Chloride 96 - 112 mmol/L - - 98  CO2 22 - 29 mEq/L 27 - 27  Calcium 8.4 - 10.4 mg/dL 10.7(H) - 9.4  Total Protein 6.4 - 8.3 g/dL 7.5 - 7.0  Total Bilirubin 0.20 - 1.20 mg/dL 0.57 - 1.5(H)  Alkaline Phos 40 - 150 U/L 68 - 69  AST 5 - 34 U/L 17 20 49(H)  ALT 0 - 55 U/L _0 Assessment and Plan: [name] 74 y.o. femalewith  1. ER/PR positive invasive mammary carcinoma in left breast in 2006 with local recurrence in 2011.   -She is tolerating she is all very well, without significant side effects. -Although we routinely do adjuvant letrozole for a total 5 years, giving her local recurrent disease, I'll extend her letrozole for longer period of time, likely 8-10 years.  -Lab reviewed with  her, unremarkable except mild hypercalcemia, which has been chronic and stable.  2. Hypercalcemia -She will follow-up with her primary care physician -She knows to drink fluids adequately and avoid dehydration -Avoid calcium supplement  3. Osteopenia -Continue vitamin D, change to 1000u daily .  -Next DEXA scan in June 2017  4. Hypertension, diabetes, COPD -She'll continue follow-up with primary care physician  The patient will return in 6 months for labs and evaluation.   She knows to call us in the interim for any questions or concerns.  We can certainly see her sooner if needed.  I spent 25 minutes counseling the patient face to face.  The total time spent in the appointment was 30 minutes.  Truitt Merle  03/16/2015 11:22 AM

## 2015-03-16 NOTE — Telephone Encounter (Signed)
Appointments made and avs printed for patient °

## 2015-03-17 ENCOUNTER — Other Ambulatory Visit: Payer: Self-pay | Admitting: Internal Medicine

## 2015-03-24 ENCOUNTER — Ambulatory Visit (INDEPENDENT_AMBULATORY_CARE_PROVIDER_SITE_OTHER): Payer: Medicare Other | Admitting: Internal Medicine

## 2015-03-24 ENCOUNTER — Encounter: Payer: Self-pay | Admitting: Internal Medicine

## 2015-03-24 VITALS — BP 116/76 | HR 81 | Ht 63.0 in | Wt 175.4 lb

## 2015-03-24 DIAGNOSIS — J45998 Other asthma: Secondary | ICD-10-CM

## 2015-03-24 DIAGNOSIS — K219 Gastro-esophageal reflux disease without esophagitis: Secondary | ICD-10-CM | POA: Diagnosis not present

## 2015-03-24 DIAGNOSIS — J302 Other seasonal allergic rhinitis: Secondary | ICD-10-CM | POA: Diagnosis not present

## 2015-03-24 DIAGNOSIS — G4700 Insomnia, unspecified: Secondary | ICD-10-CM

## 2015-03-24 DIAGNOSIS — G4730 Sleep apnea, unspecified: Secondary | ICD-10-CM
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
## 2015-03-24 NOTE — Patient Instructions (Signed)
We can continue present meds  Consider trying otc Pepcid/ famotidine daily before a meal to reduce acid indigestion. Better control may also help your cough.  Ok to continue claritin and to add otc steroid nasal spray Flonase/ fluticasone for allergy if needed.

## 2015-04-10 ENCOUNTER — Other Ambulatory Visit: Payer: Self-pay | Admitting: Internal Medicine

## 2015-04-15 ENCOUNTER — Other Ambulatory Visit: Payer: Self-pay | Admitting: Internal Medicine

## 2015-04-30 ENCOUNTER — Emergency Department (HOSPITAL_COMMUNITY)
Admission: EM | Admit: 2015-04-30 | Discharge: 2015-04-30 | Disposition: A | Discharge status: Home / Self Care | Payer: Medicare Other | Attending: Emergency Medicine | Admitting: Emergency Medicine

## 2015-04-30 ENCOUNTER — Emergency Department (HOSPITAL_COMMUNITY): Payer: Medicare Other

## 2015-04-30 ENCOUNTER — Encounter (HOSPITAL_COMMUNITY): Payer: Self-pay | Admitting: *Deleted

## 2015-04-30 DIAGNOSIS — S53004A Unspecified dislocation of right radial head, initial encounter: Secondary | ICD-10-CM | POA: Insufficient documentation

## 2015-04-30 DIAGNOSIS — I10 Essential (primary) hypertension: Secondary | ICD-10-CM | POA: Insufficient documentation

## 2015-04-30 DIAGNOSIS — Z853 Personal history of malignant neoplasm of breast: Secondary | ICD-10-CM | POA: Diagnosis not present

## 2015-04-30 DIAGNOSIS — Z79899 Other long term (current) drug therapy: Secondary | ICD-10-CM | POA: Insufficient documentation

## 2015-04-30 DIAGNOSIS — E119 Type 2 diabetes mellitus without complications: Secondary | ICD-10-CM | POA: Diagnosis not present

## 2015-04-30 DIAGNOSIS — S53104A Unspecified dislocation of right ulnohumeral joint, initial encounter: Secondary | ICD-10-CM

## 2015-04-30 DIAGNOSIS — W01198A Fall on same level from slipping, tripping and stumbling with subsequent striking against other object, initial encounter: Secondary | ICD-10-CM | POA: Diagnosis not present

## 2015-04-30 DIAGNOSIS — S59901A Unspecified injury of right elbow, initial encounter: Secondary | ICD-10-CM | POA: Diagnosis present

## 2015-04-30 DIAGNOSIS — Y9289 Other specified places as the place of occurrence of the external cause: Secondary | ICD-10-CM | POA: Diagnosis not present

## 2015-04-30 DIAGNOSIS — Z880 Allergy status to penicillin: Secondary | ICD-10-CM | POA: Insufficient documentation

## 2015-04-30 DIAGNOSIS — Z86010 Personal history of colonic polyps: Secondary | ICD-10-CM | POA: Diagnosis not present

## 2015-04-30 DIAGNOSIS — S52121A Displaced fracture of head of right radius, initial encounter for closed fracture: Secondary | ICD-10-CM

## 2015-04-30 DIAGNOSIS — Y998 Other external cause status: Secondary | ICD-10-CM | POA: Insufficient documentation

## 2015-04-30 DIAGNOSIS — Z7951 Long term (current) use of inhaled steroids: Secondary | ICD-10-CM | POA: Diagnosis not present

## 2015-04-30 DIAGNOSIS — Y9323 Activity, snow (alpine) (downhill) skiing, snow boarding, sledding, tobogganing and snow tubing: Secondary | ICD-10-CM | POA: Diagnosis not present

## 2015-04-30 DIAGNOSIS — S0031XA Abrasion of nose, initial encounter: Secondary | ICD-10-CM | POA: Diagnosis not present

## 2015-04-30 DIAGNOSIS — Z87891 Personal history of nicotine dependence: Secondary | ICD-10-CM | POA: Insufficient documentation

## 2015-04-30 DIAGNOSIS — E785 Hyperlipidemia, unspecified: Secondary | ICD-10-CM | POA: Insufficient documentation

## 2015-04-30 DIAGNOSIS — J449 Chronic obstructive pulmonary disease, unspecified: Secondary | ICD-10-CM | POA: Diagnosis not present

## 2015-04-30 MED ORDER — PROPOFOL 10 MG/ML IV BOLUS
INTRAVENOUS | Status: AC
  Filled 2015-04-30: qty 20

## 2015-04-30 MED ORDER — LIDOCAINE HCL 2 % IJ SOLN
10.0000 mL | Freq: Once | INTRAMUSCULAR | Status: DC
  Filled 2015-04-30: qty 20

## 2015-04-30 MED ORDER — HYDROMORPHONE HCL 1 MG/ML IJ SOLN
1.0000 mg | Freq: Once | INTRAMUSCULAR | Status: AC
  Administered 2015-04-30: 1 mg via INTRAVENOUS
  Filled 2015-04-30: qty 1

## 2015-04-30 MED ORDER — DIAZEPAM 5 MG/ML IJ SOLN
5.0000 mg | Freq: Once | INTRAMUSCULAR | Status: AC
  Administered 2015-04-30: 5 mg via INTRAVENOUS
  Filled 2015-04-30: qty 2

## 2015-04-30 MED ORDER — OXYCODONE-ACETAMINOPHEN 5-325 MG PO TABS: 1.0000 | ORAL_TABLET | Freq: Four times a day (QID) | ORAL | Status: DC | PRN

## 2015-04-30 MED ORDER — MORPHINE SULFATE 4 MG/ML IJ SOLN
4.0000 mg | Freq: Once | INTRAMUSCULAR | Status: AC
  Administered 2015-04-30: 4 mg via INTRAVENOUS
  Filled 2015-04-30: qty 1

## 2015-04-30 NOTE — ED Notes (Signed)
MD at bedside. 

## 2015-04-30 NOTE — Progress Notes (Signed)
Orthopedic Tech Progress Note Patient Details:  [name] 20-Jun-1941 [id number] Assisted with reduction of elbow dislocation.  Applied fiberglass long arm splint to RUE.  Pulses, sensation, motion intact before and after splinting.  Capillary refill less than 2 seconds before and after splinting.  Left arm sling with pt.'s nurse for later application. Ortho Devices Type of Ortho Device: Long arm splint, Arm sling Ortho Device/Splint Location: RUE Ortho Device/Splint Interventions: Application   Darrol Poke 04/30/2015, 3:58 PM

## 2015-04-30 NOTE — ED Notes (Signed)
Patient unable to sign due to arm.

## 2015-04-30 NOTE — ED Notes (Signed)
Pt placed on 4L O2 after receiving medication. spO2 increased to 100%
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
## 2015-04-30 NOTE — ED Provider Notes (Signed)
CSN: [id number]     Arrival date & time 04/30/15  1232 History   First MD Initiated Contact with Patient 04/30/15 1236     Chief Complaint  Patient presents with  . Fall  . Arm Injury     (Consider location/radiation/quality/duration/timing/severity/associated sxs/prior Treatment) The history is provided by the patient.  [name] is a 74 y.o. female hx of DM, HL, here with right arm injury. She states that she was stepping down a muddy hill and tried to grab to the deck and heard a pop and thought she had dislocated her right elbow. She states that she didn't hit her head but was noticed to have an abrasion to her nose. Denies any loss of consciousness or passing out. Patient is not on blood thinners. Tetanus is up-to-date.    Past Medical History  Diagnosis Date  . Diabetes mellitus   . Hyperlipidemia   . Asthma     dx in adulthood (74 y/o) PFT 09/08/09 FEV1 1.70/81%; FEV1/FVC 0.75; +resp to dilartor; NI LV/DLCO  . Breast CA     hx of bilateral diagnosied in 2006, recurred 2011- [name]  . Colonic polyp   . Osteopenia   . Allergic rhinitis   . OSA (obstructive sleep apnea)     CPAP intolerant  . Peripheral neuropathy   . COPD (chronic obstructive pulmonary disease)   . Chronic constipation   . HTN (hypertension) 11-2011   Past Surgical History  Procedure Laterality Date  . Appendectomy    . Mastectomy  2006    bilateral  . Pilonidal cyst surgery      ? of  . Flexible sigmoidoscopy N/A 01/31/2013    Procedure: FLEXIBLE SIGMOIDOSCOPY;  Surgeon: [name], MD;  Location: [facility];  Service: Endoscopy;  Laterality: N/A;  unprepped with sedation  . Mastectomy     Family History  Problem Relation Age of Onset  . Breast cancer Other     GM,sister  . Colon cancer Neg Hx   . Diabetes Brother   . Heart attack Neg Hx     no h/o early diseae  . COPD Mother     smoker  . Hypertension Mother   . Lung cancer Father     died age 42   . Cancer Maternal Grandmother     breast    History  Substance Use Topics  . Smoking status: Former Smoker -- 1.00 packs/day for 4 years    Types: Cigarettes    Quit date: 04/01/1968  . Smokeless tobacco: Never Used     Comment: Quit at age 80  . Alcohol Use: No   OB History    No data available     Review of Systems  Musculoskeletal:       R elbow pain   All other systems reviewed and are negative.     Allergies  Peanut-containing drug products and Penicillins  Home Medications   Prior to Admission medications   Medication Sig Start Date End Date Taking? Authorizing Provider  beclomethasone (QVAR) 80 MCG/ACT inhaler Inhale 2 puffs into the lungs 2 (two) times daily. 10/07/14   Colon Branch, MD  cyclobenzaprine (FLEXERIL) 10 MG tablet Take 1 tablet (10 mg total) by mouth 2 (two) times daily as needed for muscle spasms. 01/16/15   Tiffany Carlota Raspberry, PA-C  diphenhydrAMINE (BENADRYL) 25 MG tablet Take 50 mg by mouth daily as needed. For swelling    Historical Provider, MD  EPINEPHrine (EPIPEN 2-PAK) 0.3  mg/0.3 mL IJ SOAJ injection Inject 0.3 mLs (0.3 mg total) into the muscle once. 04/11/15   Colon Branch, MD  glucose blood test strip Check blood sugar daily. DX code: 250.00 01/20/14   Colon Branch, MD  ibuprofen (ADVIL,MOTRIN) 200 MG tablet Take 200 mg by mouth every 6 (six) hours as needed for moderate pain.    Historical Provider, MD  letrozole (FEMARA) 2.5 MG tablet TAKE 1 TABLET (2.5 MG TOTAL) BY MOUTH DAILY. 03/16/15   Truitt Merle, MD  loratadine (CLARITIN) 10 MG tablet Take 10 mg by mouth daily as needed.     Historical Provider, MD  magnesium hydroxide (MILK OF MAGNESIA) 800 MG/5ML suspension Take 5 mLs by mouth every evening.      Historical Provider, MD  metFORMIN (GLUCOPHAGE) 1000 MG tablet Take 1 tablet (1,000 mg total) by mouth daily. 03/17/15   Colon Branch, MD  montelukast (SINGULAIR) 10 MG tablet Take 1 tablet (10 mg total) by mouth at bedtime. 03/14/15   Colon Branch, MD  Multiple  Vitamin (MULTIVITAMIN) tablet Take 1 tablet by mouth daily.    Historical Provider, MD  RESTASIS 0.05 % ophthalmic emulsion  03/29/15   Historical Provider, MD  simvastatin (ZOCOR) 40 MG tablet Take 1 tablet (40 mg total) by mouth at bedtime. 04/15/15   Colon Branch, MD  triamterene-hydrochlorothiazide (MAXZIDE-25) 37.5-25 MG per tablet TAKE 1 TABLET BY MOUTH DAILY 02/24/15   Colon Branch, MD  VENTOLIN HFA 108 (90 BASE) MCG/ACT inhaler INHALE 2 PUFFS INTO THE LUNGS EVERY 4 HOURS AS NEEDED FOR WHEEZING OR SHORTNESS OF BREATH 01/07/14   Deneise Lever, MD   BP 140/71 mmHg  Pulse 81  Temp(Src) 98.4 F (36.9 C) (Oral)  Resp 17  SpO2 100% Physical Exam  Constitutional: She is oriented to person, place, and time.  Uncomfortable   HENT:  Head: Normocephalic.  Mouth/Throat: Oropharynx is clear and moist.  Atraumatic head. One abrasion L nose and another left side of face. No bony tenderness. No epistaxis currently   Eyes: Conjunctivae are normal. Pupils are equal, round, and reactive to light.  Neck: Normal range of motion. Neck supple.  Cardiovascular: Normal rate, regular rhythm and normal heart sounds.   Pulmonary/Chest: Effort normal and breath sounds normal. No respiratory distress. She has no wheezes. She has no rales.  Abdominal: Bowel sounds are normal. She exhibits no distension. There is no tenderness. There is no rebound.  Pelvis stable   Musculoskeletal:  R elbow swollen. Mild distal humerus and proximal forearm tenderness, ? Elbow dislocation. 2+ pulses. No wrist tenderness. Able to wiggle fingers   Neurological: She is alert and oriented to person, place, and time.  Skin: Skin is warm and dry.  Psychiatric: She has a normal mood and affect. Her behavior is normal. Judgment and thought content normal.  Nursing note and vitals reviewed.   ED Course  Reduction of dislocation Date/Time: 04/30/2015 4:25 PM Performed by: Wandra Arthurs Authorized by: Wandra Arthurs Consent: Verbal consent  obtained. Risks and benefits: risks, benefits and alternatives were discussed Consent given by: patient Patient understanding: patient states understanding of the procedure being performed Patient consent: the patient's understanding of the procedure matches consent given Procedure consent: procedure consent matches procedure scheduled Relevant documents: relevant documents present and verified Required items: required blood products, implants, devices, and special equipment available Patient identity confirmed: verbally with patient Time out: Immediately prior to procedure a "time out" was called to verify the  correct patient, procedure, equipment, support staff and site/side marked as required. Preparation: Patient was prepped and draped in the usual sterile fashion. Local anesthesia used: no Patient sedated: no Patient tolerance: Patient tolerated the procedure well with no immediate complications Comments: Unable to reduce elbow successfully.    (including critical care time)  Procedural sedation Performed by: Darl Householder, [place] Coupland Consent: Verbal consent obtained. Risks and benefits: risks, benefits and alternatives were discussed Required items: required blood products, implants, devices, and special equipment available Patient identity confirmed: arm band and provided demographic data Time out: Immediately prior to procedure a "time out" was called to verify the correct patient, procedure, equipment, support staff and site/side marked as required.  Sedation type: moderate (conscious) sedation NPO time confirmed and considedered  Sedatives: PROPOFOL  Physician Time at Bedside: 20 min   Vitals: Vital signs were monitored during sedation. Cardiac Monitor, pulse oximeter Patient tolerance: Patient tolerated the procedure well with no immediate complications. Comments: Pt with uneventful recovered. Returned to pre-procedural sedation baseline     Labs Review Labs Reviewed - No data to  display  Imaging Review Dg Elbow 2 Views Right  04/30/2015   CLINICAL DATA:  Status post reduction of a right elbow fracture/dislocation.  EXAM: RIGHT ELBOW - 2 VIEW  COMPARISON:  Earlier today.  FINDINGS: The previously seen elbow dislocation has been reduced. There are 2 fracture fragments anterior to the distal humerus. The previously noted radial head fracture is again demonstrated.  IMPRESSION: Reduced elbow dislocation with fractures as described above.   Electronically Signed   By: Claudie Revering M.D.   On: 04/30/2015 16:12   Dg Elbow 2 Views Right  04/30/2015   CLINICAL DATA:  Post reduction right elbow  EXAM: RIGHT ELBOW - 2 VIEW  COMPARISON:  04/30/2015 at 1345 hr  FINDINGS: Although alignment is improved, there is mild persistent posterior elbow dislocation/subluxation, best visualized on the lateral view.  Suspected radial head fracture on the frontal view.  IMPRESSION: Mild persistent posterior elbow joint dislocation/ subluxation.  Suspected radial head fracture.   Electronically Signed   By: Julian Hy M.D.   On: 04/30/2015 15:17   Dg Elbow Complete Right  04/30/2015   CLINICAL DATA:  [place] down a hill while outside approximately 2 hr prior to admission to the emergency department, injuring the right elbow. Obvious deformity. Initial encounter.  EXAM: RIGHT ELBOW - COMPLETE 3+ VIEW  COMPARISON:  None.  FINDINGS: Dislocation of the elbow, with posterior displacement of the ulna relative to the humerus and posterior displacement of the radial head relative to the capitellum. Bone fragment immediately anterior to the distal humerus is likely the tip of the coronoid process of the ulna. No other visible fractures.  IMPRESSION: 1. Dislocation of the elbow, with posterior displacement of the radius and ulna relative to the distal humerus. 2. Fracture fragment anterior to the distal humerus is likely the tip of the coronoid process of the ulna.   Electronically Signed   By: Evangeline Dakin  M.D.   On: 04/30/2015 14:04   Dg Forearm Right  04/30/2015   CLINICAL DATA:  Fall 2 hours ago, right elbow pain. Obvious deformity.  EXAM: RIGHT FOREARM - 2 VIEW  COMPARISON:  None.  FINDINGS: There is posterior dislocation of the radius and ulna relative to the humerus. Small fracture fragments noted anterior to the distal humerus. Probable fracture through the radial head. Soft tissues are intact.  IMPRESSION: Dislocation of the right elbow. Small fracture fragments noted. Probable  fracture through the radial head.   Electronically Signed   By: Rolm Baptise M.D.   On: 04/30/2015 14:04   Dg Humerus Right  04/30/2015   CLINICAL DATA:  Marked right arm pain with elbow deformity following a fall down a hill outside 2 hours ago.  EXAM: RIGHT HUMERUS - 2+ VIEW  COMPARISON:  Right elbow a radiographs obtained at the same time.  FINDINGS: Posterior and proximal dislocation of the ulna and radius relative to the distal humerus with a fracture fragment anterior to the distal humerus and an anterior radial head fracture. No fracture or dislocation more proximally.  IMPRESSION: Right elbow dislocation and fractures, as described above.   Electronically Signed   By: Claudie Revering M.D.   On: 04/30/2015 14:02     EKG Interpretation None      MDM   Final diagnoses:  None   [name] is a 74 y.o. female here with R arm injury, abrasions to face. Facial abrasion are superficial. Neuro exam unremarkable so will not need CT head. Will get xray RUE to r/o fracture vs dislocation.   3:20 PM Attempted to reduce dislocation. Unable to reduce. Consulted Dr. Burney Gauze who will attempt to reduce with conscious sedation.  4:28 PM  I performed conscious sedation with propofol. Dr. Burney Gauze performed reduction. Confirmed by xray. Has radial head fracture as well. Posterior splint applied, given sling. Now awake and alert. Will dc home with percocet. Dr. Burney Gauze will see her Monday.     Wandra Arthurs,  MD 04/30/15 [phone]

## 2015-04-30 NOTE — Consult Note (Signed)
Reason for Consult:right elbow pain s/p fall Referring Physician: Murrell Redden is an 74 y.o. female.  HPI: s/p fall with right elbow fracture/dislocation  Past Medical History  Diagnosis Date  . Diabetes mellitus   . Hyperlipidemia   . Asthma     dx in adulthood (74 y/o) PFT 09/08/09 FEV1 1.70/81%; FEV1/FVC 0.75; +resp to dilartor; NI LV/DLCO  . Breast CA     hx of bilateral diagnosied in 2006, recurred 2011- [name]  . Colonic polyp   . Osteopenia   . Allergic rhinitis   . OSA (obstructive sleep apnea)     CPAP intolerant  . Peripheral neuropathy   . COPD (chronic obstructive pulmonary disease)   . Chronic constipation   . HTN (hypertension) 11-2011    Past Surgical History  Procedure Laterality Date  . Appendectomy    . Mastectomy  2006    bilateral  . Pilonidal cyst surgery      ? of  . Flexible sigmoidoscopy N/A 01/31/2013    Procedure: FLEXIBLE SIGMOIDOSCOPY;  Surgeon: [name], MD;  Location: [facility];  Service: Endoscopy;  Laterality: N/A;  unprepped with sedation  . Mastectomy      Family History  Problem Relation Age of Onset  . Breast cancer Other     GM,sister  . Colon cancer Neg Hx   . Diabetes Brother   . Heart attack Neg Hx     no h/o early diseae  . COPD Mother     smoker  . Hypertension Mother   . Lung cancer Father     died age 61  . Cancer Maternal Grandmother     breast     Social History:  reports that she quit smoking about 47 years ago. Her smoking use included Cigarettes. She has a 4 pack-year smoking history. She has never used smokeless tobacco. She reports that she does not drink alcohol or use illicit drugs.  Allergies:  Allergies  Allergen Reactions  . Food     nuts  . Peanut-Containing Drug Products     Nuts  . Penicillins Other (See Comments)    REACTION: outer rectal itching    Medications: Scheduled:   No results found for this or any previous visit (from the past 48  hour(s)).  Dg Elbow 2 Views Right  04/30/2015   CLINICAL DATA:  Post reduction right elbow  EXAM: RIGHT ELBOW - 2 VIEW  COMPARISON:  04/30/2015 at 1345 hr  FINDINGS: Although alignment is improved, there is mild persistent posterior elbow dislocation/subluxation, best visualized on the lateral view.  Suspected radial head fracture on the frontal view.  IMPRESSION: Mild persistent posterior elbow joint dislocation/ subluxation.  Suspected radial head fracture.   Electronically Signed   By: Julian Hy M.D.   On: 04/30/2015 15:17   Dg Elbow Complete Right  04/30/2015   CLINICAL DATA:  [place] down a hill while outside approximately 2 hr prior to admission to the emergency department, injuring the right elbow. Obvious deformity. Initial encounter.  EXAM: RIGHT ELBOW - COMPLETE 3+ VIEW  COMPARISON:  None.  FINDINGS: Dislocation of the elbow, with posterior displacement of the ulna relative to the humerus and posterior displacement of the radial head relative to the capitellum. Bone fragment immediately anterior to the distal humerus is likely the tip of the coronoid process of the ulna. No other visible fractures.  IMPRESSION: 1. Dislocation of the elbow, with posterior displacement of the radius and ulna relative  to the distal humerus. 2. Fracture fragment anterior to the distal humerus is likely the tip of the coronoid process of the ulna.   Electronically Signed   By: Evangeline Dakin M.D.   On: 04/30/2015 14:04   Dg Forearm Right  04/30/2015   CLINICAL DATA:  Fall 2 hours ago, right elbow pain. Obvious deformity.  EXAM: RIGHT FOREARM - 2 VIEW  COMPARISON:  None.  FINDINGS: There is posterior dislocation of the radius and ulna relative to the humerus. Small fracture fragments noted anterior to the distal humerus. Probable fracture through the radial head. Soft tissues are intact.  IMPRESSION: Dislocation of the right elbow. Small fracture fragments noted. Probable fracture through the radial head.    Electronically Signed   By: Rolm Baptise M.D.   On: 04/30/2015 14:04   Dg Humerus Right  04/30/2015   CLINICAL DATA:  Marked right arm pain with elbow deformity following a fall down a hill outside 2 hours ago.  EXAM: RIGHT HUMERUS - 2+ VIEW  COMPARISON:  Right elbow a radiographs obtained at the same time.  FINDINGS: Posterior and proximal dislocation of the ulna and radius relative to the distal humerus with a fracture fragment anterior to the distal humerus and an anterior radial head fracture. No fracture or dislocation more proximally.  IMPRESSION: Right elbow dislocation and fractures, as described above.   Electronically Signed   By: Claudie Revering M.D.   On: 04/30/2015 14:02    Review of Systems  All other systems reviewed and are negative.  Blood pressure 139/70, pulse 84, temperature 98.4 F (36.9 C), temperature source Oral, resp. rate 23, SpO2 99 %. Physical Exam  Constitutional: She is oriented to person, place, and time. She appears well-developed and well-nourished.  HENT:  Head: Normocephalic and atraumatic.  Cardiovascular: Normal rate.   Respiratory: Effort normal.  Musculoskeletal:       Right elbow: She exhibits swelling and deformity. Tenderness found. Radial head tenderness noted.  Right elbow fracture/dislocation s/p fall today  Neurological: She is alert and oriented to person, place, and time.  Skin: Skin is warm.  Psychiatric: She has a normal mood and affect. Her behavior is normal. Judgment and thought content normal.    Assessment/Plan: As above   IV sedation proided by ER staff and gentle closed reduction performed at bedside    Will see in my office this Monday to schedule right radial head replacement  Fannye Myer A 04/30/2015, 3:53 PM

## 2015-04-30 NOTE — Discharge Instructions (Signed)
Take motrin for pain.   Take percocet for severe pain. You cannot drive until you see Dr. Burney Gauze.   Apply ice to area.  See Dr. Burney Gauze Monday afternoon. Call office Monday morning.   Return to ER if you have severe pain, unable to feel your fingers.

## 2015-04-30 NOTE — ED Notes (Signed)
Pt arrives with splint to rt arm.

## 2015-04-30 NOTE — Progress Notes (Signed)
RT called to be present for Conscious Sedation for displaced elbow/arm. Pt on NRB at 15 L with no complications. Pt stable throughout. RT will continue to monitor.

## 2015-04-30 NOTE — Sedation Documentation (Signed)
Patient  Given fluids.

## 2015-06-06 ENCOUNTER — Other Ambulatory Visit: Payer: Self-pay

## 2015-06-07 ENCOUNTER — Encounter: Payer: Self-pay | Admitting: Internal Medicine

## 2015-06-07 ENCOUNTER — Ambulatory Visit (INDEPENDENT_AMBULATORY_CARE_PROVIDER_SITE_OTHER): Payer: Medicare Other | Admitting: Internal Medicine

## 2015-06-07 VITALS — BP 124/64 | HR 73 | Temp 99.0°F | Ht 63.0 in | Wt 167.4 lb

## 2015-06-07 DIAGNOSIS — J302 Other seasonal allergic rhinitis: Secondary | ICD-10-CM | POA: Diagnosis not present

## 2015-06-07 DIAGNOSIS — E119 Type 2 diabetes mellitus without complications: Secondary | ICD-10-CM

## 2015-06-07 DIAGNOSIS — E785 Hyperlipidemia, unspecified: Secondary | ICD-10-CM | POA: Diagnosis not present

## 2015-06-07 MED ORDER — MONTELUKAST SODIUM 10 MG PO TABS
10.0000 mg | ORAL_TABLET | Freq: Every day | ORAL | Status: DC
Start: 2015-06-07 — End: 2015-09-29

## 2015-06-07 MED ORDER — TRIAMTERENE-HCTZ 37.5-25 MG PO TABS: 1.0000 | ORAL_TABLET | Freq: Every day | ORAL | Status: DC

## 2015-06-07 NOTE — Progress Notes (Signed)
Pre visit review using our clinic review tool, if applicable. No additional management support is needed unless otherwise documented below in the visit note. 

## 2015-06-07 NOTE — Patient Instructions (Signed)
Go to the lab  Tried to get a diabetic Ensure daily

## 2015-06-07 NOTE — Progress Notes (Signed)
Subjective:    Patient ID: [name], female    DOB: 1941-02-07, 74 y.o.   MRN: [id number]  DOS:  06/07/2015 Type of visit - description : rov Interval history: Since the last visit she had a fall and broke her right elbow, status post surgery, still doing physical therapy. Diabetes, good compliance of medication. Asthma, currently asymptomatic, not taking any medication for asthma regularly Complains of weight loss since the elbow accident, admits that she has been quite inactive and appetite is a slightly  decreased.   Review of Systems Denies chest pain or difficulty breathing. No palpitations No nausea, vomiting, diarrhea. No blood in the stools. She is upset about her broken elbow and the loss of freedom but not anxious or depressed.  Past Medical History  Diagnosis Date  . Diabetes mellitus   . Hyperlipidemia   . Asthma     dx in adulthood (74 y/o) PFT 09/08/09 FEV1 1.70/81%; FEV1/FVC 0.75; +resp to dilartor; NI LV/DLCO  . Breast CA     hx of bilateral diagnosied in 2006, recurred 2011- [name]  . Colonic polyp   . Osteopenia   . Allergic rhinitis   . OSA (obstructive sleep apnea)     CPAP intolerant  . Peripheral neuropathy   . COPD (chronic obstructive pulmonary disease)   . Chronic constipation   . HTN (hypertension) 11-2011  . Dislocation of right elbow 2016    Past Surgical History  Procedure Laterality Date  . Appendectomy    . Mastectomy  2006    bilateral  . Pilonidal cyst surgery      ? of  . Flexible sigmoidoscopy N/A 01/31/2013    Procedure: FLEXIBLE SIGMOIDOSCOPY;  Surgeon: [name], MD;  Location: [facility];  Service: Endoscopy;  Laterality: N/A;  unprepped with sedation  . Mastectomy    . Elbow surgery Right 2016    DISLOCATION    History   Social History  . Marital Status: Widowed    Spouse Name: N/A  . Number of Children: 2  . Years of Education: N/A   Occupational History  . runs a  non-profit    Social History Main Topics  . Smoking status: Former Smoker -- 1.00 packs/day for 4 years    Types: Cigarettes    Quit date: 04/01/1968  . Smokeless tobacco: Never Used     Comment: Quit at age 50  . Alcohol Use: No  . Drug Use: No  . Sexual Activity: Not Currently   Other Topics Concern  . Not on file   Social History Narrative   Widowed last husband 4-10, lives alone, retired Pharmacist, hospital   2 children, Luke twins          Medication List       This list is accurate as of: 06/07/15  5:41 PM.  Always use your most recent med list.               aspirin EC 81 MG tablet  Take 81 mg by mouth daily.     beclomethasone 80 MCG/ACT inhaler  Commonly known as:  QVAR  Inhale 2 puffs into the lungs 2 (two) times daily.     cycloSPORINE 0.05 % ophthalmic emulsion  Commonly known as:  RESTASIS  Place 1 drop into both eyes 2 (two) times daily.     diphenhydrAMINE 25 MG tablet  Commonly known as:  BENADRYL  Take 50 mg by mouth daily as needed (swelling).  EPINEPHrine 0.3 mg/0.3 mL Soaj injection  Commonly known as:  EPIPEN 2-PAK  Inject 0.3 mLs (0.3 mg total) into the muscle once.     glucose blood test strip  Check blood sugar daily. DX code: 250.00     ibuprofen 200 MG tablet  Commonly known as:  ADVIL,MOTRIN  Take 400 mg by mouth daily as needed for moderate pain.     letrozole 2.5 MG tablet  Commonly known as:  FEMARA  TAKE 1 TABLET (2.5 MG TOTAL) BY MOUTH DAILY.     levalbuterol 0.63 MG/3ML nebulizer solution  Commonly known as:  XOPENEX  Take 0.63 mg by nebulization every 4 (four) hours as needed for wheezing or shortness of breath.     loratadine 10 MG tablet  Commonly known as:  CLARITIN  Take 10 mg by mouth daily as needed (seasonal allergies).     magnesium citrate Soln  Take 1 Bottle by mouth at bedtime as needed for severe constipation (constipation).     magnesium hydroxide 800 MG/5ML suspension  Commonly known as:  MILK OF MAGNESIA    Take 30 mLs by mouth at bedtime as needed for constipation.     metFORMIN 1000 MG tablet  Commonly known as:  GLUCOPHAGE  Take 1 tablet (1,000 mg total) by mouth daily.     montelukast 10 MG tablet  Commonly known as:  SINGULAIR  Take 1 tablet (10 mg total) by mouth at bedtime.     multivitamin with minerals Tabs tablet  Take 1 tablet by mouth daily.     oxyCODONE-acetaminophen 5-325 MG per tablet  Commonly known as:  PERCOCET  Take 1-2 tablets by mouth every 6 (six) hours as needed.     polyvinyl alcohol 1.4 % ophthalmic solution  Commonly known as:  LIQUIFILM TEARS  Place 1 drop into both eyes 2 (two) times daily as needed for dry eyes.     simvastatin 40 MG tablet  Commonly known as:  ZOCOR  Take 1 tablet (40 mg total) by mouth at bedtime.     triamterene-hydrochlorothiazide 37.5-25 MG per tablet  Commonly known as:  MAXZIDE-25  Take 1 tablet by mouth daily.     VENTOLIN HFA 108 (90 BASE) MCG/ACT inhaler  Generic drug:  albuterol  INHALE 2 PUFFS INTO THE LUNGS EVERY 4 HOURS AS NEEDED FOR WHEEZING OR SHORTNESS OF BREATH           Objective:   Physical Exam BP 124/64 mmHg  Pulse 73  Temp(Src) 99 F (37.2 C) (Oral)  Ht 5\' 3"  (1.6 m)  Wt 167 lb 6 oz (75.921 kg)  BMI 29.66 kg/m2  SpO2 98% General:   Well developed, well nourished . NAD.  HEENT:  Normocephalic . Face symmetric, atraumatic Lungs:  CTA B Normal respiratory effort, no intercostal retractions, no accessory muscle use. Heart: RRR,  no murmur.  No pretibial edema bilaterally  MSK: Right elbow is braced Skin: Not pale. Not jaundice Neurologic:  alert & oriented X3.  Speech normal, gait appropriate for age and unassisted Psych--  Cognition and judgment appear intact.  Cooperative with normal attention span and concentration.  Behavior appropriate. No anxious or depressed appearing.        Assessment & Plan:    Diabetes, on metformin, check A1c, refill medications  Elbow fracture,  under the care of for surgery  Weight loss, She has lost approximately 7-8  pounds since the last time she was here,her appetite is slightly decreased and she has not  been active (losing muscle mass?). She's not depressed. Last TSH normal. Plan: Try to increase physical activity, encouraged to use ensure, come back in 4 months  High cholesterol, well-controlled, needs a refill simvastatin  Asthma, asymptomatic, not taking any of her medications regularly, taking when necessary only.

## 2015-06-08 LAB — HEMOGLOBIN A1C: HEMOGLOBIN A1C: 6.1 % (ref 4.6–6.5)

## 2015-06-24 NOTE — Assessment & Plan Note (Signed)
Discussed need to maintain strict control of reflux symptoms and potential interaction with her reactive airways. Plan-Pepcid

## 2015-06-24 NOTE — Assessment & Plan Note (Signed)
Spring pollens associated with mild increased postnasal drip. Plan-antihistamines as needed

## 2015-06-24 NOTE — Assessment & Plan Note (Signed)
Has failed to tolerate CPAP trials and did not wish to pursue other alternatives. Has not managed weight loss.

## 2015-06-24 NOTE — Assessment & Plan Note (Signed)
Occasional use of rescue inhaler. Wheezing does not keep her awake.

## 2015-08-19 ENCOUNTER — Telehealth: Payer: Self-pay | Admitting: Internal Medicine

## 2015-08-19 NOTE — Telephone Encounter (Signed)
Error:[id number] ° °

## 2015-08-22 ENCOUNTER — Encounter (HOSPITAL_BASED_OUTPATIENT_CLINIC_OR_DEPARTMENT_OTHER): Payer: Self-pay | Admitting: Emergency Medicine

## 2015-08-22 ENCOUNTER — Encounter: Payer: Self-pay | Admitting: Internal Medicine

## 2015-08-22 ENCOUNTER — Ambulatory Visit (INDEPENDENT_AMBULATORY_CARE_PROVIDER_SITE_OTHER): Payer: Medicare Other | Admitting: Internal Medicine

## 2015-08-22 ENCOUNTER — Telehealth: Payer: Self-pay | Admitting: Internal Medicine

## 2015-08-22 ENCOUNTER — Emergency Department (HOSPITAL_BASED_OUTPATIENT_CLINIC_OR_DEPARTMENT_OTHER)
Admission: EM | Admit: 2015-08-22 | Discharge: 2015-08-22 | Disposition: A | Discharge status: Home / Self Care | Payer: Medicare Other | Attending: Emergency Medicine | Admitting: Emergency Medicine

## 2015-08-22 ENCOUNTER — Emergency Department (HOSPITAL_BASED_OUTPATIENT_CLINIC_OR_DEPARTMENT_OTHER): Payer: Medicare Other

## 2015-08-22 VITALS — BP 122/74 | HR 101 | Temp 98.8°F | Ht 63.0 in | Wt 182.4 lb

## 2015-08-22 DIAGNOSIS — G4733 Obstructive sleep apnea (adult) (pediatric): Secondary | ICD-10-CM | POA: Diagnosis not present

## 2015-08-22 DIAGNOSIS — E119 Type 2 diabetes mellitus without complications: Secondary | ICD-10-CM | POA: Diagnosis not present

## 2015-08-22 DIAGNOSIS — E785 Hyperlipidemia, unspecified: Secondary | ICD-10-CM | POA: Diagnosis not present

## 2015-08-22 DIAGNOSIS — I10 Essential (primary) hypertension: Secondary | ICD-10-CM | POA: Diagnosis not present

## 2015-08-22 DIAGNOSIS — R339 Retention of urine, unspecified: Secondary | ICD-10-CM | POA: Diagnosis not present

## 2015-08-22 DIAGNOSIS — R32 Unspecified urinary incontinence: Secondary | ICD-10-CM

## 2015-08-22 DIAGNOSIS — Z853 Personal history of malignant neoplasm of breast: Secondary | ICD-10-CM | POA: Diagnosis not present

## 2015-08-22 DIAGNOSIS — Z8739 Personal history of other diseases of the musculoskeletal system and connective tissue: Secondary | ICD-10-CM | POA: Insufficient documentation

## 2015-08-22 DIAGNOSIS — J449 Chronic obstructive pulmonary disease, unspecified: Secondary | ICD-10-CM | POA: Insufficient documentation

## 2015-08-22 DIAGNOSIS — Z86010 Personal history of colonic polyps: Secondary | ICD-10-CM | POA: Insufficient documentation

## 2015-08-22 DIAGNOSIS — Z87891 Personal history of nicotine dependence: Secondary | ICD-10-CM | POA: Insufficient documentation

## 2015-08-22 DIAGNOSIS — K5900 Constipation, unspecified: Secondary | ICD-10-CM | POA: Diagnosis not present

## 2015-08-22 DIAGNOSIS — Z7982 Long term (current) use of aspirin: Secondary | ICD-10-CM | POA: Diagnosis not present

## 2015-08-22 DIAGNOSIS — Z79899 Other long term (current) drug therapy: Secondary | ICD-10-CM | POA: Insufficient documentation

## 2015-08-22 DIAGNOSIS — Z7951 Long term (current) use of inhaled steroids: Secondary | ICD-10-CM | POA: Diagnosis not present

## 2015-08-22 DIAGNOSIS — Z87828 Personal history of other (healed) physical injury and trauma: Secondary | ICD-10-CM | POA: Insufficient documentation

## 2015-08-22 DIAGNOSIS — Z880 Allergy status to penicillin: Secondary | ICD-10-CM | POA: Insufficient documentation

## 2015-08-22 LAB — POCT URINALYSIS DIPSTICK
Bilirubin, UA: NEGATIVE
Glucose, UA: NEGATIVE
Ketones, UA: NEGATIVE
Nitrite, UA: NEGATIVE
PH UA: 5.5
PROTEIN UA: NEGATIVE
Spec Grav, UA: 1.02
Urobilinogen, UA: 0.2

## 2015-08-22 LAB — URINE MICROSCOPIC-ADD ON

## 2015-08-22 LAB — URINALYSIS, ROUTINE W REFLEX MICROSCOPIC
BILIRUBIN URINE: NEGATIVE
GLUCOSE, UA: NEGATIVE mg/dL
Ketones, ur: NEGATIVE mg/dL
Leukocytes, UA: NEGATIVE
Nitrite: NEGATIVE
Protein, ur: NEGATIVE mg/dL
SPECIFIC GRAVITY, URINE: 1.011 (ref 1.005–1.030)
UROBILINOGEN UA: 0.2 mg/dL (ref 0.0–1.0)
pH: 5 (ref 5.0–8.0)

## 2015-08-22 LAB — CBG MONITORING, ED: Glucose-Capillary: 82 mg/dL (ref 65–99)

## 2015-08-22 MED ORDER — FLEET ENEMA 7-19 GM/118ML RE ENEM
ENEMA | RECTAL | Status: AC
  Administered 2015-08-22: 16:00:00
  Filled 2015-08-22: qty 1

## 2015-08-22 MED ORDER — MINERAL OIL RE ENEM: 1.0000 | ENEMA | Freq: Once | RECTAL | Status: DC

## 2015-08-22 NOTE — ED Notes (Signed)
[name] [phone]

## 2015-08-22 NOTE — Progress Notes (Signed)
Pre visit review using our clinic review tool, if applicable. No additional management support is needed unless otherwise documented below in the visit note. 

## 2015-08-22 NOTE — ED Notes (Signed)
Pt dressed and sitting in chair, "ready to go, waiting on ride", given ice water per request, (denies: pain, needs or questions), VSS, alert, NAD< calm, interactive, speaking in clear complete sentences, resps e/u, no dyspnea noted, leg bag draining appropriately.

## 2015-08-22 NOTE — Patient Instructions (Signed)
Please go to the ER for further evaluation °

## 2015-08-22 NOTE — ED Notes (Signed)
Pt sent down from Dr. Larose Kells office for eval of incontinence of urine and bowel. Pt states bowel problem has been for one week. Urine incontinence has been 4 days. Pain to abd and rectum.

## 2015-08-22 NOTE — Progress Notes (Signed)
Subjective:    Patient ID: [name], female    DOB: [birth year], 74 y.o.   MRN: [id number]  DOS:  08/22/2015 Type of visit - description : Acute visit CC "incontinence " Interval history: For the last 3 days, she has not been able to urinate normally: She feels the urgency to go and when she does she urinates very little. Sometimes she loses small amount of urine throughout the day. Also, has a history of chronic constipation, here lately  her stools have been actually loose and she has seen stools in her underwear on and off throughout the day.    Review of Systems Denies fever or chills. Appetite is normal No nausea, vomiting, blood in the stool or actual abdominal pain No dysuria, gross hematuria difficulty urinating. Denies back pain at all, she has peripheral neuropathy and has no new symptoms.  Wt Readings from Last 3 Encounters:  08/22/15 182 lb 6 oz (82.725 kg)  06/07/15 167 lb 6 oz (75.921 kg)  03/24/15 175 lb 6.4 oz (79.561 kg)     Past Medical History  Diagnosis Date  . Diabetes mellitus   . Hyperlipidemia   . Asthma     dx in adulthood (74 y/o) PFT 09/08/09 FEV1 1.70/81%; FEV1/FVC 0.75; +resp to dilartor; NI LV/DLCO  . Breast CA     hx of bilateral diagnosied in 2006, recurred 2011- [name]  . Colonic polyp   . Osteopenia   . Allergic rhinitis   . OSA (obstructive sleep apnea)     CPAP intolerant  . Peripheral neuropathy   . COPD (chronic obstructive pulmonary disease)   . Chronic constipation   . HTN (hypertension) 11-2011  . Dislocation of right elbow 2016    Past Surgical History  Procedure Laterality Date  . Appendectomy    . Mastectomy  2006    bilateral  . Pilonidal cyst surgery      ? of  . Flexible sigmoidoscopy N/A 01/31/2013    Procedure: FLEXIBLE SIGMOIDOSCOPY;  Surgeon: [name], MD;  Location: [facility];  Service: Endoscopy;  Laterality: N/A;  unprepped with sedation  . Mastectomy    . Elbow  surgery Right 2016    DISLOCATION    Social History   Social History  . Marital Status: Widowed    Spouse Name: N/A  . Number of Children: 2  . Years of Education: N/A   Occupational History  . runs a non-profit    Social History Main Topics  . Smoking status: Former Smoker -- 1.00 packs/day for 4 years    Types: Cigarettes    Quit date: 04/01/1968  . Smokeless tobacco: Never Used     Comment: Quit at age 25  . Alcohol Use: No  . Drug Use: No  . Sexual Activity: Not Currently   Other Topics Concern  . 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  Always use your most recent med list.               aspirin EC 81 MG tablet  Take 81 mg by mouth daily.     beclomethasone 80 MCG/ACT inhaler  Commonly known as:  QVAR  Inhale 2 puffs into the lungs 2 (two) times  daily.     cycloSPORINE 0.05 % ophthalmic emulsion  Commonly known as:  RESTASIS  Place 1 drop into both eyes 2 (two) times daily.     diphenhydrAMINE 25 MG tablet  Commonly known as:  BENADRYL  Take 50 mg by mouth daily as needed (swelling).     EPINEPHrine 0.3 mg/0.3 mL Soaj injection  Commonly known as:  EPIPEN 2-PAK  Inject 0.3 mLs (0.3 mg total) into the muscle once.     glucose blood test strip  Check blood sugar daily. DX code: 250.00     ibuprofen 200 MG tablet  Commonly known as:  ADVIL,MOTRIN  Take 400 mg by mouth daily as needed for moderate pain.     letrozole 2.5 MG tablet  Commonly known as:  FEMARA  TAKE 1 TABLET (2.5 MG TOTAL) BY MOUTH DAILY.     levalbuterol 0.63 MG/3ML nebulizer solution  Commonly known as:  XOPENEX  Take 0.63 mg by nebulization every 4 (four) hours as needed for wheezing or shortness of breath.     loratadine 10 MG tablet  Commonly known as:  CLARITIN  Take 10 mg by mouth daily as needed (seasonal  allergies).     magnesium citrate Soln  Take 1 Bottle by mouth at bedtime as needed for severe constipation (constipation).     magnesium hydroxide 800 MG/5ML suspension  Commonly known as:  MILK OF MAGNESIA  Take 30 mLs by mouth at bedtime as needed for constipation.     metFORMIN 1000 MG tablet  Commonly known as:  GLUCOPHAGE  Take 1 tablet (1,000 mg total) by mouth daily.     montelukast 10 MG tablet  Commonly known as:  SINGULAIR  Take 1 tablet (10 mg total) by mouth at bedtime.     multivitamin with minerals Tabs tablet  Take 1 tablet by mouth daily.     polyvinyl alcohol 1.4 % ophthalmic solution  Commonly known as:  LIQUIFILM TEARS  Place 1 drop into both eyes 2 (two) times daily as needed for dry eyes.     simvastatin 40 MG tablet  Commonly known as:  ZOCOR  Take 1 tablet (40 mg total) by mouth at bedtime.     triamterene-hydrochlorothiazide 37.5-25 MG per tablet  Commonly known as:  MAXZIDE-25  Take 1 tablet by mouth daily.     VENTOLIN HFA 108 (90 BASE) MCG/ACT inhaler  Generic drug:  albuterol  INHALE 2 PUFFS INTO THE LUNGS EVERY 4 HOURS AS NEEDED FOR WHEEZING OR SHORTNESS OF BREATH           Objective:   Physical Exam  Abdominal:     BP 122/74 mmHg  Pulse 101  Temp(Src) 98.8 F (37.1 C) (Oral)  Ht 5\' 3"  (1.6 m)  Wt 182 lb 6 oz (82.725 kg)  BMI 32.31 kg/m2  SpO2 98% General:   Well developed, well nourished . NAD.  HEENT:  Normocephalic . Face symmetric, atraumatic Abdomen:  slt  distended, soft, palpable, nontender suprapubic mass, likely a full bladder. No shift dullness DRE: Abundant but soft brown stools noted. Skin: Not pale. Not jaundice Neurologic:  alert & oriented X3.  Speech normal, gait unsteady, assisted by a cane. DTRs decreased throughout, absent left knee jerk?. strength  symmetric Psych--  Cognition and judgment appear intact.  Cooperative with normal attention span and concentration.  Behavior appropriate. No anxious or  depressed appearing.    Assessment & Plan:   74 year old lady with history of) breast cancer, diabetes, peripheral  neuropathy presents with urinary retention, stool impaction (?);  denies back pain. Had a CT abdomen 01-2015,  see report below  The patient was able to urinate 150 mL, after that the bladder was still palpable.Udip: + blood, leukocytes I believe she needs further eval including a Foley catheter,  KUB, labs, etc  On clinical grounds, I don't believe she has a cauda equina syndrome given the lack of back pain although the threshold for further evaluation is low. Case discussed with the ER physician who agreed to see the patient.   IMPRESSION: 1. Distended stool and air-filled colon similar to prior study. Very tortuous and redundant sigmoid colon but I do not see any findings to suggest a sigmoid volvulus. 2. No acute abdominal/pelvic findings, mass lesions or adenopathy. 3. Single gallstone and right renal calculus. 4. Mild bladder distention.

## 2015-08-22 NOTE — Telephone Encounter (Signed)
Patient states that she has Hx of problems with bowels and takes MOM. Has had recent surgery(elbow) and now has urinary incontinance. New onset-today. Would like to know if she needs to be seen or if there is any medication she could be given. States she is unable to leave her home because of this.  Patient will be seen today

## 2015-08-22 NOTE — ED Provider Notes (Signed)
CSN: [id number]     Arrival date & time 08/22/15  1512 History  This chart was scribed for Blanchie Dessert, MD by Stephania Fragmin, ED Scribe. This patient was seen in room MH02/MH02 and the patient's care was started at 3:26 PM.     Chief Complaint  Patient presents with  . Urinary Incontinence  . Encopresis   The history is provided by the patient. No language interpreter was used.    HPI Comments: [name] is a 74 y.o. female with a history of chronic constipation, who presents to the Emergency Department complaining of bowel incontinence that began 1 week ago and bladder incontinence that began 4 days ago, after being sent here from Dr. Ethel Rana office today. Patient states she has been taking pain medication for a trauma to her right arm, and has been taking milk of magnesia to counteract possible side effects of constipation. Patient states she then started to have stool leakage in her pants that she describes as non-formed, "pasty" BMs, which was followed by urinary incontinence days after and feeling unable to completely empty her bladder. She states she feels her bowel is full of stool at this time. She also complains of abdominal pain with palpation, which she noticed at Dr. Ethel Rana office. This is a new problem. Patient states she has not yet eaten today because she is not hungry. She denies dysuria, hematuria, fever, numbness, tingling, nausea, or vomiting.   Past Medical History  Diagnosis Date  . Diabetes mellitus   . Hyperlipidemia   . Asthma     dx in adulthood (74 y/o) PFT 09/08/09 FEV1 1.70/81%; FEV1/FVC 0.75; +resp to dilartor; NI LV/DLCO  . Breast CA     hx of bilateral diagnosied in 2006, recurred 2011- [name]  . Colonic polyp   . Osteopenia   . Allergic rhinitis   . OSA (obstructive sleep apnea)     CPAP intolerant  . Peripheral neuropathy   . COPD (chronic obstructive pulmonary disease)   . Chronic constipation   . HTN (hypertension) 11-2011  .  Dislocation of right elbow 2016   Past Surgical History  Procedure Laterality Date  . Appendectomy    . Mastectomy  2006    bilateral  . Pilonidal cyst surgery      ? of  . Flexible sigmoidoscopy N/A 01/31/2013    Procedure: FLEXIBLE SIGMOIDOSCOPY;  Surgeon: [name], MD;  Location: [place] [facility];  Service: Endoscopy;  Laterality: N/A;  unprepped with sedation  . Mastectomy    . Elbow surgery Right 2016    DISLOCATION   Family History  Problem Relation Age of Onset  . Breast cancer Other     GM,sister  . Colon cancer Neg Hx   . Diabetes Brother   . Heart attack Neg Hx     no h/o early diseae  . COPD Mother     smoker  . Hypertension Mother   . Lung cancer Father     died age 25  . Cancer Maternal Grandmother     breast    Social History  Substance Use Topics  . Smoking status: Former Smoker -- 1.00 packs/day for 4 years    Types: Cigarettes    Quit date: 04/01/1968  . Smokeless tobacco: Never Used     Comment: Quit at age 45  . Alcohol Use: No   OB History    No data available     Review of Systems  Constitutional: Negative for fever.  Gastrointestinal: Positive for abdominal pain. Negative for nausea and vomiting.  Genitourinary: Negative for dysuria and hematuria.       Positive for bowel and bladder incontinence  Neurological: Negative for numbness.   Allergies  Peanut-containing drug products and Penicillins  Home Medications   Prior to Admission medications   Medication Sig Start Date End Date Taking? Authorizing Provider  aspirin EC 81 MG tablet Take 81 mg by mouth daily.    Historical Provider, MD  beclomethasone (QVAR) 80 MCG/ACT inhaler Inhale 2 puffs into the lungs 2 (two) times daily. 10/07/14   Colon Branch, MD  cycloSPORINE (RESTASIS) 0.05 % ophthalmic emulsion Place 1 drop into both eyes 2 (two) times daily.    Historical Provider, MD  diphenhydrAMINE (BENADRYL) 25 MG tablet Take 50 mg by mouth daily as needed (swelling).      Historical Provider, MD  EPINEPHrine (EPIPEN 2-PAK) 0.3 mg/0.3 mL IJ SOAJ injection Inject 0.3 mLs (0.3 mg total) into the muscle once. Patient not taking: Reported on 06/07/2015 04/11/15   Colon Branch, MD  glucose blood test strip Check blood sugar daily. DX code: 250.00 Patient not taking: Reported on 06/07/2015 01/20/14   Colon Branch, MD  ibuprofen (ADVIL,MOTRIN) 200 MG tablet Take 400 mg by mouth daily as needed for moderate pain.     Historical Provider, MD  letrozole (FEMARA) 2.5 MG tablet TAKE 1 TABLET (2.5 MG TOTAL) BY MOUTH DAILY. Patient taking differently: Take 2.5 mg by mouth daily. TAKE 1 TABLET (2.5 MG TOTAL) BY MOUTH DAILY 03/16/15   Truitt Merle, MD  levalbuterol Penne Lash) 0.63 MG/3ML nebulizer solution Take 0.63 mg by nebulization every 4 (four) hours as needed for wheezing or shortness of breath.    Historical Provider, MD  loratadine (CLARITIN) 10 MG tablet Take 10 mg by mouth daily as needed (seasonal allergies).     Historical Provider, MD  magnesium citrate SOLN Take 1 Bottle by mouth at bedtime as needed for severe constipation (constipation).    Historical Provider, MD  magnesium hydroxide (MILK OF MAGNESIA) 800 MG/5ML suspension Take 30 mLs by mouth at bedtime as needed for constipation.     Historical Provider, MD  metFORMIN (GLUCOPHAGE) 1000 MG tablet Take 1 tablet (1,000 mg total) by mouth daily. 03/17/15   Colon Branch, MD  montelukast (SINGULAIR) 10 MG tablet Take 1 tablet (10 mg total) by mouth at bedtime. 06/07/15   Colon Branch, MD  Multiple Vitamin (MULTIVITAMIN WITH MINERALS) TABS tablet Take 1 tablet by mouth daily.    Historical Provider, MD  polyvinyl alcohol (LIQUIFILM TEARS) 1.4 % ophthalmic solution Place 1 drop into both eyes 2 (two) times daily as needed for dry eyes.    Historical Provider, MD  simvastatin (ZOCOR) 40 MG tablet Take 1 tablet (40 mg total) by mouth at bedtime. Patient taking differently: Take 40 mg by mouth daily.  04/15/15   Colon Branch, MD   triamterene-hydrochlorothiazide (MAXZIDE-25) 37.5-25 MG per tablet Take 1 tablet by mouth daily. 06/07/15   Colon Branch, MD  VENTOLIN HFA 108 (90 BASE) MCG/ACT inhaler INHALE 2 PUFFS INTO THE LUNGS EVERY 4 HOURS AS NEEDED FOR WHEEZING OR SHORTNESS OF BREATH Patient taking differently: INHALE 2 PUFFS INTO THE LUNGS DAILY  AS NEEDED FOR WHEEZING OR SHORTNESS OF BREATH 01/07/14   Deneise Lever, MD   There were no vitals taken for this visit. Physical Exam  Constitutional: She is oriented to person, place, and time. She appears well-developed and  well-nourished. No distress.  HENT:  Head: Normocephalic and atraumatic.  Eyes: Conjunctivae and EOM are normal.  Neck: Neck supple. No tracheal deviation present.  Cardiovascular: Normal rate, regular rhythm and normal heart sounds.   Pulmonary/Chest: Effort normal. No respiratory distress. She has no wheezes. She has no rales.  Abdominal: There is tenderness. There is guarding. There is no rebound.  Bowel sounds present. Suprapubic tenderness with guarding, and mild tenderness in the RLQ and LLQ. No rebound, no peritoneal signs.  Musculoskeletal: Normal range of motion.  Neurological: She is alert and oriented to person, place, and time.  Skin: Skin is warm and dry.  Psychiatric: She has a normal mood and affect. Her behavior is normal.  Nursing note and vitals reviewed.   ED Course  Procedures (including critical care time)  DIAGNOSTIC STUDIES: Oxygen Saturation is 97% on RA, normal by my interpretation.    COORDINATION OF CARE: 3:30 PM - Discussed treatment plan with pt at bedside which includes U/S to examine bladder and XR to examine stool content. Will possibly perform enema pending imaging results. Pt verbalized understanding and agreed to plan.   Labs Review Labs Reviewed  URINALYSIS, ROUTINE W REFLEX MICROSCOPIC (NOT AT [facility]) - Abnormal; Notable for the following:    Hgb urine dipstick LARGE (*)    All other components within normal  limits  URINE MICROSCOPIC-ADD ON  CBG MONITORING, ED    Imaging Review Dg Abd 1 View  08/22/2015   CLINICAL DATA:  Pt states she has had abdomen pain x 2 weeks on the left side with constipation. Last BM just prior to x-rays after enema given. Hx of chronic polyp, htn and breast cancer.  EXAM: ABDOMEN - 1 VIEW  COMPARISON:  01/15/2015  FINDINGS: There is mild diffuse colonic distention with moderate increase colonic stool burden most evident in the rectosigmoid. There is no gross free air on this single supine study. No significant soft tissue abnormality. The bony structures are demineralized. Mild degenerative changes noted throughout the visualized spine.  IMPRESSION: 1. There is mild diffuse colonic distention without evidence of obstruction. 2. Moderate increased stool burden most evident in the rectosigmoid.   Electronically Signed   By: Lajean Manes M.D.   On: 08/22/2015 16:50   I have personally reviewed and evaluated these images and lab results as part of my medical decision-making.   MDM   Final diagnoses:  Constipation  Urinary retention   Patient presenting with 4 days of no bowel movements with stool leakage and now 2-3 days of inability to completely empty her bladder. She has had decreased appetite but denies any nausea or vomiting. Her only abdominal pain is over the bladder area. She denies any fevers, cough, shortness of breath. She has no numbness or tingling that is new in her legs, difficulty walking or new weakness. Patient states everything started after she was taking pain medication for a recent arm injury.  Patient initially seen in Dr.Paz's office and rectal exam there showed soft large stool burden which was unable to be manually removed. She was sent down here for further care. Patient's UA done in the office showed red blood cells but only 1+ trace leukocytes and no evidence of frank infection.  Bedside ultrasound showed significant amount of urinary retention. KUB  pending. Patient is nontoxic and otherwise has normal vital signs. Gave patient option of trying an enema first to relieve some stool burden to see if she was then able to empty her bladder. If  not she will have a catheter placed.  8:41 PM Foley catheter placed and 2 L of urine drained out. Patient passed some more stool. Patient is ambulating and appears well with normal vital signs. She was sent home with a Foley catheter she and her daughter were educated on this. Patient follow-up with Dr. positive in 2-3 days for catheter removal.  I personally performed the services described in this documentation, which was scribed in my presence.  The recorded information has been reviewed and considered.     Blanchie Dessert, MD 08/22/15 2042

## 2015-08-23 ENCOUNTER — Telehealth: Payer: Self-pay | Admitting: Internal Medicine

## 2015-08-23 DIAGNOSIS — Z978 Presence of other specified devices: Secondary | ICD-10-CM

## 2015-08-23 DIAGNOSIS — Z960 Presence of urogenital implants: Secondary | ICD-10-CM

## 2015-08-23 DIAGNOSIS — R339 Retention of urine, unspecified: Secondary | ICD-10-CM

## 2015-08-23 LAB — URINALYSIS, ROUTINE W REFLEX MICROSCOPIC
Bilirubin Urine: NEGATIVE
Ketones, ur: NEGATIVE
NITRITE: NEGATIVE
PH: 5.5 (ref 5.0–8.0)
Specific Gravity, Urine: 1.01 (ref 1.000–1.030)
Total Protein, Urine: NEGATIVE
URINE GLUCOSE: NEGATIVE
Urobilinogen, UA: 0.2 (ref 0.0–1.0)

## 2015-08-23 LAB — URINE CULTURE

## 2015-08-23 NOTE — Telephone Encounter (Signed)
Was eval at emergency room yesterday, found to have urinary retention and sent home with a Foley catheter. Please call the patient, see how she's doing. Arrange a urology visit for this week, DX urinary retention, has a Foley catheter in. Schedule a visit here for next week

## 2015-08-23 NOTE — Telephone Encounter (Signed)
Relation to VU:DTHY Call back number:[phone] Pharmacy:  Reason for call:  Patient was seen in the ED 08/22/15 and would like to schedule appoiontment for catheter removal. Please advise patient directly

## 2015-08-23 NOTE — Telephone Encounter (Signed)
Urgent Urology referral placed

## 2015-08-23 NOTE — Telephone Encounter (Signed)
See other telephone note for further details.

## 2015-08-23 NOTE — Telephone Encounter (Signed)
Spoke with Pt, she is doing somewhat better. She is uncomfortable due to the catheter that is in place. She wanted to know when she could make an appointment to remove the catheter and I informed her that we have placed a urgent referral to Urology who will determine when the catheter can be removed. Pt also informed me that the ED emptied the catheter bag twice yesterday and she has emptied it once since being home, but is currently almost full and is getting ready to empty again. Pt scheduled ED F/U appt for 09/02/2015 at 1445. I instructed her to call the office tomorrow around mid-day if she has not heard from Urology to schedule an appointment.

## 2015-08-24 ENCOUNTER — Telehealth: Payer: Self-pay | Admitting: *Deleted

## 2015-08-24 NOTE — Telephone Encounter (Signed)
Patient called stating that her foley is not working properly.  She states "the numbers are going down."  She states she emptied it earlier today, but she was supposed to make sure the number are he same and they are going down.  Asked patient whether this was her urine output and she did not know.  She denies pain or pressure, but states she felt like she had to urinate earlier and nothing happened.  Patient states that there is urine in the bag currently, her daughter is coming by to empty it.    Patient has a referral to Urology placed 08/23/15.  Donzetta Kohut, referral coordinator faxed information to [facility] yesterday and notified office of issues with catheter- they stated they will add this information to the referral.    Followed-up with patient who stated they had not called her yet regarding appointment.  Notified patient that if she does not see urine output, if she has any abdominal pain or pressure to go to Urgent Care clinic to have foley checked, she stated understanding and agreed.

## 2015-08-26 ENCOUNTER — Telehealth: Payer: Self-pay | Admitting: Internal Medicine

## 2015-08-26 NOTE — Telephone Encounter (Signed)
Discussed with [facility] that the pt has been seen by [facility] in the past. She will call and try to get pt in there to be seen.

## 2015-08-28 ENCOUNTER — Telehealth: Payer: Self-pay | Admitting: Internal Medicine

## 2015-08-28 NOTE — Telephone Encounter (Signed)
Please call and check on the patient, was she seen by urology?

## 2015-08-29 NOTE — Telephone Encounter (Signed)
LMOM informing Pt to return call.  

## 2015-08-31 NOTE — Telephone Encounter (Signed)
Im sorry its Dr Tresa Moore

## 2015-08-31 NOTE — Telephone Encounter (Signed)
Have been unable to contact Pt via telephone. Has Pt been able to get in with Urology yet?

## 2015-08-31 NOTE — Telephone Encounter (Signed)
thx

## 2015-08-31 NOTE — Telephone Encounter (Signed)
FYI. Pt was able to be seen at [facility] by Dr. Tresa Moore on 08/25/2015, has F/U appt with them on 9/26. Pt has ED F/U scheduled with [place] on 09/02/2015.

## 2015-08-31 NOTE — Telephone Encounter (Signed)
Patient was seen by Urology on 08/25/15 w/ Dr Tammi Klippel at [facility] and has a follow on 9/26. I did speak with [facility]

## 2015-09-02 ENCOUNTER — Ambulatory Visit (INDEPENDENT_AMBULATORY_CARE_PROVIDER_SITE_OTHER): Payer: Medicare Other | Admitting: Internal Medicine

## 2015-09-02 ENCOUNTER — Emergency Department (HOSPITAL_BASED_OUTPATIENT_CLINIC_OR_DEPARTMENT_OTHER)
Admission: EM | Admit: 2015-09-02 | Discharge: 2015-09-02 | Disposition: A | Discharge status: Home / Self Care | Payer: Medicare Other | Attending: Emergency Medicine | Admitting: Emergency Medicine

## 2015-09-02 ENCOUNTER — Encounter (HOSPITAL_BASED_OUTPATIENT_CLINIC_OR_DEPARTMENT_OTHER): Payer: Self-pay | Admitting: *Deleted

## 2015-09-02 VITALS — BP 126/82 | HR 87 | Temp 98.5°F | Ht 63.0 in | Wt 177.2 lb

## 2015-09-02 DIAGNOSIS — Z86010 Personal history of colonic polyps: Secondary | ICD-10-CM | POA: Diagnosis not present

## 2015-09-02 DIAGNOSIS — Z8739 Personal history of other diseases of the musculoskeletal system and connective tissue: Secondary | ICD-10-CM | POA: Insufficient documentation

## 2015-09-02 DIAGNOSIS — K5900 Constipation, unspecified: Secondary | ICD-10-CM

## 2015-09-02 DIAGNOSIS — E119 Type 2 diabetes mellitus without complications: Secondary | ICD-10-CM | POA: Insufficient documentation

## 2015-09-02 DIAGNOSIS — Z7951 Long term (current) use of inhaled steroids: Secondary | ICD-10-CM | POA: Insufficient documentation

## 2015-09-02 DIAGNOSIS — E785 Hyperlipidemia, unspecified: Secondary | ICD-10-CM | POA: Insufficient documentation

## 2015-09-02 DIAGNOSIS — Z09 Encounter for follow-up examination after completed treatment for conditions other than malignant neoplasm: Secondary | ICD-10-CM

## 2015-09-02 DIAGNOSIS — Z87891 Personal history of nicotine dependence: Secondary | ICD-10-CM | POA: Diagnosis not present

## 2015-09-02 DIAGNOSIS — R339 Retention of urine, unspecified: Secondary | ICD-10-CM

## 2015-09-02 DIAGNOSIS — Z8719 Personal history of other diseases of the digestive system: Secondary | ICD-10-CM | POA: Diagnosis not present

## 2015-09-02 DIAGNOSIS — R319 Hematuria, unspecified: Secondary | ICD-10-CM

## 2015-09-02 DIAGNOSIS — Z853 Personal history of malignant neoplasm of breast: Secondary | ICD-10-CM | POA: Diagnosis not present

## 2015-09-02 DIAGNOSIS — Z7982 Long term (current) use of aspirin: Secondary | ICD-10-CM | POA: Insufficient documentation

## 2015-09-02 DIAGNOSIS — G4733 Obstructive sleep apnea (adult) (pediatric): Secondary | ICD-10-CM | POA: Diagnosis not present

## 2015-09-02 DIAGNOSIS — E876 Hypokalemia: Secondary | ICD-10-CM | POA: Diagnosis not present

## 2015-09-02 DIAGNOSIS — Z880 Allergy status to penicillin: Secondary | ICD-10-CM | POA: Insufficient documentation

## 2015-09-02 DIAGNOSIS — I10 Essential (primary) hypertension: Secondary | ICD-10-CM | POA: Diagnosis not present

## 2015-09-02 DIAGNOSIS — Z87828 Personal history of other (healed) physical injury and trauma: Secondary | ICD-10-CM | POA: Diagnosis not present

## 2015-09-02 DIAGNOSIS — J449 Chronic obstructive pulmonary disease, unspecified: Secondary | ICD-10-CM | POA: Insufficient documentation

## 2015-09-02 DIAGNOSIS — T8383XA Hemorrhage of genitourinary prosthetic devices, implants and grafts, initial encounter: Secondary | ICD-10-CM | POA: Diagnosis not present

## 2015-09-02 DIAGNOSIS — K5909 Other constipation: Secondary | ICD-10-CM

## 2015-09-02 DIAGNOSIS — Y846 Urinary catheterization as the cause of abnormal reaction of the patient, or of later complication, without mention of misadventure at the time of the procedure: Secondary | ICD-10-CM | POA: Insufficient documentation

## 2015-09-02 DIAGNOSIS — T839XXA Unspecified complication of genitourinary prosthetic device, implant and graft, initial encounter: Secondary | ICD-10-CM

## 2015-09-02 LAB — CBC
HCT: 41.6 % (ref 36.0–46.0)
Hemoglobin: 13.5 g/dL (ref 12.0–15.0)
MCH: 28.7 pg (ref 26.0–34.0)
MCHC: 32.5 g/dL (ref 30.0–36.0)
MCV: 88.5 fL (ref 78.0–100.0)
PLATELETS: 432 10*3/uL — AB (ref 150–400)
RBC: 4.7 MIL/uL (ref 3.87–5.11)
RDW: 13.6 % (ref 11.5–15.5)
WBC: 10.2 10*3/uL (ref 4.0–10.5)

## 2015-09-02 LAB — URINALYSIS, ROUTINE W REFLEX MICROSCOPIC
Bilirubin Urine: NEGATIVE
GLUCOSE, UA: NEGATIVE mg/dL
KETONES UR: NEGATIVE mg/dL
Nitrite: NEGATIVE
PH: 8 (ref 5.0–8.0)
Protein, ur: NEGATIVE mg/dL
SPECIFIC GRAVITY, URINE: 1.007 (ref 1.005–1.030)
Urobilinogen, UA: 0.2 mg/dL (ref 0.0–1.0)

## 2015-09-02 LAB — BASIC METABOLIC PANEL
Anion gap: 8 (ref 5–15)
BUN: 12 mg/dL (ref 6–20)
CO2: 31 mmol/L (ref 22–32)
Calcium: 9.7 mg/dL (ref 8.9–10.3)
Chloride: 101 mmol/L (ref 101–111)
Creatinine, Ser: 0.6 mg/dL (ref 0.44–1.00)
GFR calc Af Amer: >60 mL/min (ref >60)
GLUCOSE: 105 mg/dL — AB (ref 65–99)
POTASSIUM: 3.3 mmol/L — AB (ref 3.5–5.1)
Sodium: 140 mmol/L (ref 135–145)

## 2015-09-02 LAB — URINE MICROSCOPIC-ADD ON

## 2015-09-02 MED ORDER — POTASSIUM CHLORIDE CRYS ER 20 MEQ PO TBCR
20.0000 meq | EXTENDED_RELEASE_TABLET | Freq: Once | ORAL | Status: AC
  Administered 2015-09-02: 20 meq via ORAL
  Filled 2015-09-02: qty 1

## 2015-09-02 NOTE — Discharge Instructions (Signed)
It was our pleasure to provide your ER care today - we hope that you feel better.  From today's lab tests, your potassium level is mildly low (3.3) - eat plenty of fruits and vegetables, and follow up with primary care doctor in 1-2 weeks.  Follow up with urologist Monday as planned.  A urine culture was sent today - we will notify you if positive.  Also have your doctor follow up on that result Monday.  Return to ER right away if worse, new symptoms, fevers/chills, vomiting, foley catheter not draining, severe abdominal or flank pain, other concern.   Foley Catheter Care A Foley catheter is a soft, flexible tube that is placed into the bladder to drain urine. A Foley catheter may be inserted if:  You leak urine or are not able to control when you urinate (urinary incontinence).  You are not able to urinate when you need to (urinary retention).  You had prostate surgery or surgery on the genitals.  You have certain medical conditions, such as multiple sclerosis, dementia, or a spinal cord injury. If you are going home with a Foley catheter in place, follow the instructions below. TAKING CARE OF THE CATHETER  Wash your hands with soap and water.  Using mild soap and warm water on a clean washcloth:  Clean the area on your body closest to the catheter insertion site using a circular motion, moving away from the catheter. Never wipe toward the catheter because this could sweep bacteria up into the urethra and cause infection.  Remove all traces of soap. Pat the area dry with a clean towel. For males, reposition the foreskin.  Attach the catheter to your leg so there is no tension on the catheter. Use adhesive tape or a leg strap. If you are using adhesive tape, remove any sticky residue left behind by the previous tape you used.  Keep the drainage bag below the level of the bladder, but keep it off the floor.  Check throughout the day to be sure the catheter is working and urine is  draining freely. Make sure the tubing does not become kinked.  Do not pull on the catheter or try to remove it. Pulling could damage internal tissues. TAKING CARE OF THE DRAINAGE BAGS You will be given two drainage bags to take home. One is a large overnight drainage bag, and the other is a smaller leg bag that fits underneath clothing. You may wear the overnight bag at any time, but you should never wear the smaller leg bag at night. Follow the instructions below for how to empty, change, and clean your drainage bags. Emptying the Drainage Bag You must empty your drainage bag when it is  - full or at least 2-3 times a day.  Wash your hands with soap and water.  Keep the drainage bag below your hips, below the level of your bladder. This stops urine from going back into the tubing and into your bladder.  Hold the dirty bag over the toilet or a clean container.  Open the pour spout at the bottom of the bag and empty the urine into the toilet or container. Do not let the pour spout touch the toilet, container, or any other surface. Doing so can place bacteria on the bag, which can cause an infection.  Clean the pour spout with a gauze pad or cotton ball that has rubbing alcohol on it.  Close the pour spout.  Attach the bag to your leg with adhesive  tape or a leg strap.  Wash your hands well. Changing the Drainage Bag Change your drainage bag once a month or sooner if it starts to smell bad or look dirty. Below are steps to follow when changing the drainage bag.  Wash your hands with soap and water.  Pinch off the rubber catheter so that urine does not spill out.  Disconnect the catheter tube from the drainage tube at the connection valve. Do not let the tubes touch any surface.  Clean the end of the catheter tube with an alcohol wipe. Use a different alcohol wipe to clean the end of the drainage tube.  Connect the catheter tube to the drainage tube of the clean drainage  bag.  Attach the new bag to the leg with adhesive tape or a leg strap. Avoid attaching the new bag too tightly.  Wash your hands well. Cleaning the Drainage Bag  Wash your hands with soap and water.  Wash the bag in warm, soapy water.  Rinse the bag thoroughly with warm water.  Fill the bag with a solution of white vinegar and water (1 cup vinegar to 1 qt warm water [.2 L vinegar to 1 L warm water]). Close the bag and soak it for 30 minutes in the solution.  Rinse the bag with warm water.  Hang the bag to dry with the pour spout open and hanging downward.  Store the clean bag (once it is dry) in a clean plastic bag.  Wash your hands well. PREVENTING INFECTION  Wash your hands before and after handling your catheter.  Take showers daily and wash the area where the catheter enters your body. Do not take baths. Replace wet leg straps with dry ones, if this applies.  Do not use powders, sprays, or lotions on the genital area. Only use creams, lotions, or ointments as directed by your caregiver.  For females, wipe from front to back after each bowel movement.  Drink enough fluids to keep your urine clear or pale yellow unless you have a fluid restriction.  Do not let the drainage bag or tubing touch or lie on the floor.  Wear cotton underwear to absorb moisture and to keep your skin drier. SEEK MEDICAL CARE IF:   Your urine is cloudy or smells unusually bad.  Your catheter becomes clogged.  You are not draining urine into the bag or your bladder feels full.  Your catheter starts to leak. SEEK IMMEDIATE MEDICAL CARE IF:   You have pain, swelling, redness, or pus where the catheter enters the body.  You have pain in the abdomen, legs, lower back, or bladder.  You have a fever.  You see blood fill the catheter, or your urine is pink or red.  You have nausea, vomiting, or chills.  Your catheter gets pulled out. MAKE SURE YOU:   Understand these  instructions.  Will watch your condition.  Will get help right away if you are not doing well or get worse. Document Released: 11/26/2005 Document Revised: 04/12/2014 Document Reviewed: 11/17/2012 [facility] Patient Information 2015 [place], [place]. This information is not intended to replace advice given to you by your health care provider. Make sure you discuss any questions you have with your health care provider.   Hematuria Hematuria is blood in your urine. It can be caused by a bladder infection, kidney infection, prostate infection, kidney stone, or cancer of your urinary tract. Infections can usually be treated with medicine, and a kidney stone usually will pass through  your urine. If neither of these is the cause of your hematuria, further workup to find out the reason may be needed. It is very important that you tell your health care provider about any blood you see in your urine, even if the blood stops without treatment or happens without causing pain. Blood in your urine that happens and then stops and then happens again can be a symptom of a very serious condition. Also, pain is not a symptom in the initial stages of many urinary cancers. HOME CARE INSTRUCTIONS   Drink lots of fluid, 3-4 quarts a day. If you have been diagnosed with an infection, cranberry juice is especially recommended, in addition to large amounts of water.  Avoid caffeine, tea, and carbonated beverages because they tend to irritate the bladder.  Avoid alcohol because it may irritate the prostate.  Take all medicines as directed by your health care provider.  If you were prescribed an antibiotic medicine, finish it all even if you start to feel better.  If you have been diagnosed with a kidney stone, follow your health care provider's instructions regarding straining your urine to catch the stone.  Empty your bladder often. Avoid holding urine for long periods of time.  After a bowel movement, women should  cleanse front to back. Use each tissue only once.  Empty your bladder before and after sexual intercourse if you are a female. SEEK MEDICAL CARE IF:  You develop back pain.  You have a fever.  You have a feeling of sickness in your stomach (nausea) or vomiting.  Your symptoms are not better in 3 days. Return sooner if you are getting worse. SEEK IMMEDIATE MEDICAL CARE IF:   You develop severe vomiting and are unable to keep the medicine down.  You develop severe back or abdominal pain despite taking your medicines.  You begin passing a large amount of blood or clots in your urine.  You feel extremely weak or faint, or you pass out. MAKE SURE YOU:   Understand these instructions.  Will watch your condition.  Will get help right away if you are not doing well or get worse. Document Released: 11/26/2005 Document Revised: 04/12/2014 Document Reviewed: 07/27/2013 [facility] Patient Information 2015 [place], [place]. This information is not intended to replace advice given to you by your health care provider. Make sure you discuss any questions you have with your health care provider.    Hypokalemia Hypokalemia means that the amount of potassium in the blood is lower than normal.Potassium is a chemical, called an electrolyte, that helps regulate the amount of fluid in the body. It also stimulates muscle contraction and helps nerves function properly.Most of the body's potassium is inside of cells, and only a very small amount is in the blood. Because the amount in the blood is so small, minor changes can be life-threatening. CAUSES  Antibiotics.  Diarrhea or vomiting.  Using laxatives too much, which can cause diarrhea.  Chronic kidney disease.  Water pills (diuretics).  Eating disorders (bulimia).  Low magnesium level.  Sweating a lot. SIGNS AND SYMPTOMS  Weakness.  Constipation.  Fatigue.  Muscle cramps.  Mental confusion.  Skipped heartbeats or irregular  heartbeat (palpitations).  Tingling or numbness. DIAGNOSIS  Your health care provider can diagnose hypokalemia with blood tests. In addition to checking your potassium level, your health care provider may also check other lab tests. TREATMENT Hypokalemia can be treated with potassium supplements taken by mouth or adjustments in your current medicines. If your  potassium level is very low, you may need to get potassium through a vein (IV) and be monitored in the hospital. A diet high in potassium is also helpful. Foods high in potassium are:  Nuts, such as peanuts and pistachios.  Seeds, such as sunflower seeds and pumpkin seeds.  Peas, lentils, and lima beans.  Whole grain and bran cereals and breads.  Fresh fruit and vegetables, such as apricots, avocado, bananas, cantaloupe, kiwi, oranges, tomatoes, asparagus, and potatoes.  Orange and tomato juices.  Red meats.  Fruit yogurt. HOME CARE INSTRUCTIONS  Take all medicines as prescribed by your health care provider.  Maintain a healthy diet by including nutritious food, such as fruits, vegetables, nuts, whole grains, and lean meats.  If you are taking a laxative, be sure to follow the directions on the label. SEEK MEDICAL CARE IF:  Your weakness gets worse.  You feel your heart pounding or racing.  You are vomiting or having diarrhea.  You are diabetic and having trouble keeping your blood glucose in the normal range. SEEK IMMEDIATE MEDICAL CARE IF:  You have chest pain, shortness of breath, or dizziness.  You are vomiting or having diarrhea for more than 2 days.  You faint. MAKE SURE YOU:   Understand these instructions.  Will watch your condition.  Will get help right away if you are not doing well or get worse. Document Released: 11/26/2005 Document Revised: 09/16/2013 Document Reviewed: 05/29/2013 [facility] Patient Information 2015 [place], [place]. This information is not intended to replace advice given to you  by your health care provider. Make sure you discuss any questions you have with your health care provider.

## 2015-09-02 NOTE — Progress Notes (Signed)
Subjective:    Patient ID: [name], female    DOB: 29-Aug-1941, 73 y.o.   MRN: [id number]  DOS:  09/02/2015 Type of visit - description : ER follow-up Interval history: Was sent to the ER and 08/22/2015 with a diagnosis of urinary retention and severe constipation, ER evaluation was confirmatory, x-ray show no evidence of SBO, urine culture was negative, a Foley was placed, drained 2 L urine ; she did have a large BM at the ER. Was sent home on a Foley, subsequently saw urology, failed a void trial , has a foley in    Review of Systems After the Foley was changed to urology approximately 4 days ago, the urine started to change color to pink more so in the last 2 days.. No clots.  She continue with lower abdominal discomfort for, feels like she has to have a bowel movement or urinate constantly. Her constipation has been relatively well controlled with OTC laxity, name? Denies fever or chills No nausea or vomiting  Past Medical History  Diagnosis Date  . Diabetes mellitus   . Hyperlipidemia   . Asthma     dx in adulthood (74 y/o) PFT 09/08/09 FEV1 1.70/81%; FEV1/FVC 0.75; +resp to dilartor; NI LV/DLCO  . Breast CA     hx of bilateral diagnosied in 2006, recurred 2011- [name]  . Colonic polyp   . Osteopenia   . Allergic rhinitis   . OSA (obstructive sleep apnea)     CPAP intolerant  . Peripheral neuropathy   . COPD (chronic obstructive pulmonary disease)   . Chronic constipation   . HTN (hypertension) 11-2011  . Dislocation of right elbow 2016  . Urinary retention     During massive constipation (pelvic exam, suspect 8cm or more in diameter)    Past Surgical History  Procedure Laterality Date  . Appendectomy    . Mastectomy  2006    bilateral  . Pilonidal cyst surgery      ? of  . Flexible sigmoidoscopy N/A 01/31/2013    Procedure: FLEXIBLE SIGMOIDOSCOPY;  Surgeon: [name], MD;  Location: [place];  Service: Endoscopy;   Laterality: N/A;  unprepped with sedation  . Mastectomy    . Elbow surgery Right 2016    DISLOCATION    Social History   Social History  . Marital Status: Widowed    Spouse Name: N/A  . Number of Children: 2  . Years of Education: N/A   Occupational History  . runs a non-profit    Social History Main Topics  . Smoking status: Former Smoker -- 1.00 packs/day for 4 years    Types: Cigarettes    Quit date: 04/01/1968  . Smokeless tobacco: Never Used     Comment: Quit at age 44  . Alcohol Use: No  . Drug Use: No  . Sexual Activity: Not Currently   Other Topics Concern  . Not on file   Social History Narrative   Widowed last husband 4-10, lives alone, retired Pharmacist, hospital   2 children, [place] twins          Medication List       This list is accurate as of: 09/02/15  3:42 PM.  Always use your most recent med list.               aspirin EC 81 MG tablet  Take 81 mg by mouth daily.     beclomethasone 80 MCG/ACT inhaler  Commonly known as:  QVAR  Inhale 2 puffs into the lungs 2 (two) times daily.     cycloSPORINE 0.05 % ophthalmic emulsion  Commonly known as:  RESTASIS  Place 1 drop into both eyes 2 (two) times daily.     diphenhydrAMINE 25 MG tablet  Commonly known as:  BENADRYL  Take 50 mg by mouth daily as needed (swelling).     EPINEPHrine 0.3 mg/0.3 mL Soaj injection  Commonly known as:  EPIPEN 2-PAK  Inject 0.3 mLs (0.3 mg total) into the muscle once.     glucose blood test strip  Check blood sugar daily. DX code: 250.00     ibuprofen 200 MG tablet  Commonly known as:  ADVIL,MOTRIN  Take 400 mg by mouth daily as needed for moderate pain.     letrozole 2.5 MG tablet  Commonly known as:  FEMARA  TAKE 1 TABLET (2.5 MG TOTAL) BY MOUTH DAILY.     levalbuterol 0.63 MG/3ML nebulizer solution  Commonly known as:  XOPENEX  Take 0.63 mg by nebulization every 4 (four) hours as needed for wheezing or shortness of breath.     loratadine 10 MG tablet  Commonly  known as:  CLARITIN  Take 10 mg by mouth daily as needed (seasonal allergies).     magnesium citrate Soln  Take 1 Bottle by mouth at bedtime as needed for severe constipation (constipation).     magnesium hydroxide 800 MG/5ML suspension  Commonly known as:  MILK OF MAGNESIA  Take 30 mLs by mouth at bedtime as needed for constipation.     metFORMIN 1000 MG tablet  Commonly known as:  GLUCOPHAGE  Take 1 tablet (1,000 mg total) by mouth daily.     montelukast 10 MG tablet  Commonly known as:  SINGULAIR  Take 1 tablet (10 mg total) by mouth at bedtime.     multivitamin with minerals Tabs tablet  Take 1 tablet by mouth daily.     polyvinyl alcohol 1.4 % ophthalmic solution  Commonly known as:  LIQUIFILM TEARS  Place 1 drop into both eyes 2 (two) times daily as needed for dry eyes.     simvastatin 40 MG tablet  Commonly known as:  ZOCOR  Take 1 tablet (40 mg total) by mouth at bedtime.     triamterene-hydrochlorothiazide 37.5-25 MG per tablet  Commonly known as:  MAXZIDE-25  Take 1 tablet by mouth daily.     VENTOLIN HFA 108 (90 BASE) MCG/ACT inhaler  Generic drug:  albuterol  INHALE 2 PUFFS INTO THE LUNGS EVERY 4 HOURS AS NEEDED FOR WHEEZING OR SHORTNESS OF BREATH           Objective:   Physical Exam BP 126/82 mmHg  Pulse 87  Temp(Src) 98.5 F (36.9 C) (Oral)  Ht 5\' 3"  (1.6 m)  Wt 177 lb 4 oz (80.4 kg)  BMI 31.41 kg/m2  SpO2 95% General:   Well developed, well nourished . NAD.  HEENT:  Normocephalic . Face symmetric, atraumatic Abdomen examination of the patient is sitting in a wheelchair, unable to get in the table. This time, abdomen is not distended, soft, mild tenderness at the lower abdomen bilaterally, worse at the suprapubic area without rebound. + Bowel sounds.  Urine in the bag  is indeed deep pink, no clots, she has some white debris.  Skin: Not pale. Not jaundice Neurologic:  alert & oriented X3.  Speech normal, gait not tested Psych--  Cognition  and judgment appear intact.  Cooperative with normal attention span and concentration.  Behavior appropriate. No anxious or depressed appearing.      Assessment & Plan:   Assessment >  DM HTN Hyperlipidemia Chronic, severe constipation: s/p several colonoscopies, very limited d/p poor prep unable to finish exam . Virtual CT 2011 unrevealing . Usually good response to MOM, admitted 2014 d/t impaction. Last visit Dr.Outlow 2014 Acute urinary retention 08/22/2015 Pulmonary --COPD --Asthma --OSA CPAP intolerant Osteopenia Oncology Breast cancer bilateral 2006, s/ p B mastectomy r Recurrence L mastectomy site  2011, s/p excision >> XRT, Letrozole   Plan Urinary retention: Was seen a few days ago at urology,failed a void trial, has a Foley in , urine is getting red, more so in the last 2 days. I'm afraid she may be developing a  infection. I called the urology office, MD is out of the office and they are  about to close. Plan: Will check a BMP, CBC , mg level Refer the patient to the ER, in my opinion we need to remove the Foley catheter, get a sterile sample and send her back home w/ a new foley in; I discussed this with the ER doctor who is in agreement. Patient has already a follow-up with Dr. Tammi Klippel on Monday. Chronic constipation: Severe and chronic issue, previously seen by Dr. Paulita Fujita. Was refer to GI again by urology. Encourage patient to let me know the name of the laxity if she takes.

## 2015-09-02 NOTE — ED Notes (Signed)
Pt sent down from PCP office to have urinary catheter changed out.

## 2015-09-02 NOTE — Patient Instructions (Signed)
Labs before you leave the office today  Go to the ER downstairs  Follow-up with your  urologist on Monday

## 2015-09-02 NOTE — Progress Notes (Signed)
Pre visit review using our clinic review tool, if applicable. No additional management support is needed unless otherwise documented below in the visit note. 

## 2015-09-02 NOTE — ED Notes (Signed)
Pt. Had F/C in place on arrival to ED .  F/C in place had 1124mls of red colored Urine with come small clots noted.  Pt. Reports she  Had this F/C placed in a Dr. Gabriel Carina this week sometime.

## 2015-09-02 NOTE — ED Provider Notes (Signed)
CSN: [id number]     Arrival date & time 09/02/15  1633 History   First MD Initiated Contact with Patient 09/02/15 1636     Chief Complaint  Patient presents with  . Hematuria     (Consider location/radiation/quality/duration/timing/severity/associated sxs/prior Treatment) Patient is a 74 y.o. female presenting with hematuria. The history is provided by the patient.  Hematuria Pertinent negatives include no shortness of breath.  Patient s/p recent foley placement for urinary retention 1 week ago, presents from pcp office whereby she presented today for follow up.  Dr Larose Kells saw then, and noted blood in foley bag - he requests foley be changed and urine sent.   Pt denies fever or chills. States is having good amount urine output in bag, currently approximately 800 cc blood tinged urine in bag. No abd pain or distension. No nv. No dysuria. Denies fever or chills. No flank pain. No numbness or weakness.  Denies anticoag use, or any other abn bleeding or bruising. No wt loss.        Past Medical History  Diagnosis Date  . Diabetes mellitus   . Hyperlipidemia   . Asthma     dx in adulthood (74 y/o) PFT 09/08/09 FEV1 1.70/81%; FEV1/FVC 0.75; +resp to dilartor; NI LV/DLCO  . Breast CA     hx of bilateral diagnosied in 2006, recurred 2011- [name]  . Colonic polyp   . Osteopenia   . Allergic rhinitis   . OSA (obstructive sleep apnea)     CPAP intolerant  . Peripheral neuropathy   . COPD (chronic obstructive pulmonary disease)   . Chronic constipation   . HTN (hypertension) 11-2011  . Dislocation of right elbow 2016  . Urinary retention     During massive constipation (pelvic exam, suspect 8cm or more in diameter)   Past Surgical History  Procedure Laterality Date  . Appendectomy    . Mastectomy  2006    bilateral  . Pilonidal cyst surgery      ? of  . Flexible sigmoidoscopy N/A 01/31/2013    Procedure: FLEXIBLE SIGMOIDOSCOPY;  Surgeon: [name], MD;   Location: [facility];  Service: Endoscopy;  Laterality: N/A;  unprepped with sedation  . Mastectomy    . Elbow surgery Right 2016    DISLOCATION   Family History  Problem Relation Age of Onset  . Breast cancer Other     GM,sister  . Colon cancer Neg Hx   . Diabetes Brother   . Heart attack Neg Hx     no h/o early diseae  . COPD Mother     smoker  . Hypertension Mother   . Lung cancer Father     died age 14  . Cancer Maternal Grandmother     breast    Social History  Substance Use Topics  . Smoking status: Former Smoker -- 1.00 packs/day for 4 years    Types: Cigarettes    Quit date: 04/01/1968  . Smokeless tobacco: Never Used     Comment: Quit at age 89  . Alcohol Use: No   OB History    No data available     Review of Systems  Constitutional: Negative for chills.  Respiratory: Negative for shortness of breath.   Cardiovascular: Negative for leg swelling.  Gastrointestinal: Negative for nausea, vomiting, diarrhea and abdominal distention.  Genitourinary: Positive for hematuria. Negative for dysuria.  Neurological: Negative for weakness and numbness.      Allergies  Peanut-containing drug products and  Penicillins  Home Medications   Prior to Admission medications   Medication Sig Start Date End Date Taking? Authorizing Emmajo Bennette  aspirin EC 81 MG tablet Take 81 mg by mouth daily.    Historical Zurisadai Helminiak, MD  beclomethasone (QVAR) 80 MCG/ACT inhaler Inhale 2 puffs into the lungs 2 (two) times daily. 10/07/14   Colon Branch, MD  cycloSPORINE (RESTASIS) 0.05 % ophthalmic emulsion Place 1 drop into both eyes 2 (two) times daily.    Historical Carlen Fils, MD  diphenhydrAMINE (BENADRYL) 25 MG tablet Take 50 mg by mouth daily as needed (swelling).     Historical Poppi Scantling, MD  EPINEPHrine (EPIPEN 2-PAK) 0.3 mg/0.3 mL IJ SOAJ injection Inject 0.3 mLs (0.3 mg total) into the muscle once. Patient not taking: Reported on 06/07/2015 04/11/15   Colon Branch, MD  glucose blood test  strip Check blood sugar daily. DX code: 250.00 Patient not taking: Reported on 06/07/2015 01/20/14   Colon Branch, MD  ibuprofen (ADVIL,MOTRIN) 200 MG tablet Take 400 mg by mouth daily as needed for moderate pain.     Historical Kenadie Royce, MD  letrozole (FEMARA) 2.5 MG tablet TAKE 1 TABLET (2.5 MG TOTAL) BY MOUTH DAILY. Patient taking differently: Take 2.5 mg by mouth daily. TAKE 1 TABLET (2.5 MG TOTAL) BY MOUTH DAILY 03/16/15   Truitt Merle, MD  levalbuterol Penne Lash) 0.63 MG/3ML nebulizer solution Take 0.63 mg by nebulization every 4 (four) hours as needed for wheezing or shortness of breath.    Historical Estefana Taylor, MD  loratadine (CLARITIN) 10 MG tablet Take 10 mg by mouth daily as needed (seasonal allergies).     Historical Simran Bomkamp, MD  magnesium citrate SOLN Take 1 Bottle by mouth at bedtime as needed for severe constipation (constipation).    Historical Florencio Hollibaugh, MD  magnesium hydroxide (MILK OF MAGNESIA) 800 MG/5ML suspension Take 30 mLs by mouth at bedtime as needed for constipation.     Historical Jaidy Cottam, MD  metFORMIN (GLUCOPHAGE) 1000 MG tablet Take 1 tablet (1,000 mg total) by mouth daily. 03/17/15   Colon Branch, MD  montelukast (SINGULAIR) 10 MG tablet Take 1 tablet (10 mg total) by mouth at bedtime. 06/07/15   Colon Branch, MD  Multiple Vitamin (MULTIVITAMIN WITH MINERALS) TABS tablet Take 1 tablet by mouth daily.    Historical Jahmel Flannagan, MD  polyvinyl alcohol (LIQUIFILM TEARS) 1.4 % ophthalmic solution Place 1 drop into both eyes 2 (two) times daily as needed for dry eyes.    Historical Ellen Mayol, MD  simvastatin (ZOCOR) 40 MG tablet Take 1 tablet (40 mg total) by mouth at bedtime. Patient taking differently: Take 40 mg by mouth daily.  04/15/15   Colon Branch, MD  triamterene-hydrochlorothiazide (MAXZIDE-25) 37.5-25 MG per tablet Take 1 tablet by mouth daily. 06/07/15   Colon Branch, MD  VENTOLIN HFA 108 (90 BASE) MCG/ACT inhaler INHALE 2 PUFFS INTO THE LUNGS EVERY 4 HOURS AS NEEDED FOR WHEEZING OR  SHORTNESS OF BREATH Patient taking differently: INHALE 2 PUFFS INTO THE LUNGS DAILY  AS NEEDED FOR WHEEZING OR SHORTNESS OF BREATH 01/07/14   Deneise Lever, MD   BP 144/75 mmHg  Pulse 97  Resp 18  Ht 5\' 3"  (1.6 m)  Wt 179 lb (81.194 kg)  BMI 31.72 kg/m2  SpO2 100% Physical Exam  Constitutional: She appears well-developed and well-nourished. No distress.  HENT:  Head: Atraumatic.  Eyes: Conjunctivae are normal. No scleral icterus.  Neck: Neck supple. No tracheal deviation present.  Cardiovascular: Intact distal pulses.  Pulmonary/Chest: Effort normal.  Abdominal: Soft. Normal appearance and bowel sounds are normal. She exhibits no distension and no mass. There is no tenderness. There is no rebound and no guarding.  Genitourinary:  Foley with blood-tinged urine. No cva tenderness.   Musculoskeletal: She exhibits no edema.  Neurological: She is alert.  Skin: Skin is warm and dry. No rash noted. She is not diaphoretic.  Psychiatric: She has a normal mood and affect.  Nursing note and vitals reviewed.   ED Course  Procedures (including critical care time) Labs Review   Results for orders placed or performed during the hospital encounter of 09/02/15  CBC  Result Value Ref Range   WBC 10.2 4.0 - 10.5 K/uL   RBC 4.70 3.87 - 5.11 MIL/uL   Hemoglobin 13.5 12.0 - 15.0 g/dL   HCT 41.6 36.0 - 46.0 %   MCV 88.5 78.0 - 100.0 fL   MCH 28.7 26.0 - 34.0 pg   MCHC 32.5 30.0 - 36.0 g/dL   RDW 13.6 11.5 - 15.5 %   Platelets 432 (H) 150 - 400 K/uL  Basic metabolic panel  Result Value Ref Range   Sodium 140 135 - 145 mmol/L   Potassium 3.3 (L) 3.5 - 5.1 mmol/L   Chloride 101 101 - 111 mmol/L   CO2 31 22 - 32 mmol/L   Glucose, Bld 105 (H) 65 - 99 mg/dL   BUN 12 6 - 20 mg/dL   Creatinine, Ser 0.60 0.44 - 1.00 mg/dL   Calcium 9.7 8.9 - 10.3 mg/dL   GFR calc non Af Amer >60 >60 mL/min   GFR calc Af Amer >60 >60 mL/min   Anion gap 8 5 - 15  Urinalysis, Routine w reflex microscopic  (not at [facility])  Result Value Ref Range   Color, Urine YELLOW YELLOW   APPearance CLEAR CLEAR   Specific Gravity, Urine 1.007 1.005 - 1.030   pH 8.0 5.0 - 8.0   Glucose, UA NEGATIVE NEGATIVE mg/dL   Hgb urine dipstick LARGE (A) NEGATIVE   Bilirubin Urine NEGATIVE NEGATIVE   Ketones, ur NEGATIVE NEGATIVE mg/dL   Protein, ur NEGATIVE NEGATIVE mg/dL   Urobilinogen, UA 0.2 0.0 - 1.0 mg/dL   Nitrite NEGATIVE NEGATIVE   Leukocytes, UA SMALL (A) NEGATIVE  Urine microscopic-add on  Result Value Ref Range   Squamous Epithelial / LPF RARE RARE   WBC, UA 7-10 <3 WBC/hpf   RBC / HPF 21-50 <3 RBC/hpf   Bacteria, UA FEW (A) RARE     MDM   Dr Larose Kells had called from his office, where he was seeing pt today, and requested labs be done, old foley removed, new ua/u cx from new foley, and that pt will f/u urologist Monday, and him in office.  Labs sent.  I discussed trial leaving foley out today, vs current plan of new cath and urology f/u Monday.  Patient request we place new foley today, and she will f/u as planned.  kcl po.  Recheck no new c/o.   As afeb, no dysuria, no abd or flank pain or tenderness, and recent neg ur cx, will hold abx rx and await urine culture.   Pt currently appears stable for d/c.    Lajean Saver, MD 09/02/15 1820

## 2015-09-02 NOTE — ED Notes (Signed)
MD at bedside. 

## 2015-09-03 DIAGNOSIS — Z09 Encounter for follow-up examination after completed treatment for conditions other than malignant neoplasm: Secondary | ICD-10-CM | POA: Insufficient documentation

## 2015-09-03 NOTE — Assessment & Plan Note (Signed)
Urinary retention: Was seen a few days ago at urology,failed a void trial, has a Foley in , urine is getting red, more so in the last 2 days. I'm afraid she may be developing a  infection. I called the urology office, MD is out of the office and they are  about to close. Plan: Will check a BMP, CBC , mg level Refer the patient to the ER, in my opinion we need to remove the Foley catheter, get a sterile sample and send her back home w/ a new foley in; I discussed this with the ER doctor who is in agreement. Patient has already a follow-up with Dr. Tammi Klippel on Monday. Chronic constipation: Severe and chronic issue, previously seen by Dr. Paulita Fujita. Was refer to GI again by urology. Encourage patient to let me know the name of the laxity if she takes.

## 2015-09-05 LAB — URINE CULTURE: Culture: 20000

## 2015-09-06 ENCOUNTER — Telehealth (HOSPITAL_BASED_OUTPATIENT_CLINIC_OR_DEPARTMENT_OTHER): Payer: Self-pay | Admitting: Emergency Medicine

## 2015-09-06 NOTE — Telephone Encounter (Signed)
Urine culture results faxed to [phone] [facility] per request of Syliva Overman PharmD and Delos Haring PA in the ED, urine culture + Pseudomonas 20,000 colonies, no treatment except to followup with urologist

## 2015-09-14 ENCOUNTER — Other Ambulatory Visit: Payer: Self-pay | Admitting: *Deleted

## 2015-09-14 DIAGNOSIS — C50912 Malignant neoplasm of unspecified site of left female breast: Secondary | ICD-10-CM

## 2015-09-15 ENCOUNTER — Other Ambulatory Visit (HOSPITAL_BASED_OUTPATIENT_CLINIC_OR_DEPARTMENT_OTHER): Payer: Medicare Other

## 2015-09-15 ENCOUNTER — Encounter: Payer: Self-pay | Admitting: Hematology

## 2015-09-15 ENCOUNTER — Ambulatory Visit (HOSPITAL_BASED_OUTPATIENT_CLINIC_OR_DEPARTMENT_OTHER): Payer: Medicare Other | Admitting: Hematology

## 2015-09-15 ENCOUNTER — Telehealth: Payer: Self-pay | Admitting: Hematology

## 2015-09-15 VITALS — BP 156/76 | HR 80 | Temp 98.7°F | Resp 18 | Ht 63.0 in | Wt 171.2 lb

## 2015-09-15 DIAGNOSIS — E119 Type 2 diabetes mellitus without complications: Secondary | ICD-10-CM

## 2015-09-15 DIAGNOSIS — C50912 Malignant neoplasm of unspecified site of left female breast: Secondary | ICD-10-CM

## 2015-09-15 DIAGNOSIS — I10 Essential (primary) hypertension: Secondary | ICD-10-CM | POA: Diagnosis not present

## 2015-09-15 DIAGNOSIS — M899 Disorder of bone, unspecified: Secondary | ICD-10-CM

## 2015-09-15 DIAGNOSIS — Z853 Personal history of malignant neoplasm of breast: Secondary | ICD-10-CM

## 2015-09-15 DIAGNOSIS — J449 Chronic obstructive pulmonary disease, unspecified: Secondary | ICD-10-CM

## 2015-09-15 LAB — CBC WITH DIFFERENTIAL/PLATELET
BASO%: 0.8 % (ref 0.0–2.0)
Basophils Absolute: 0.1 10*3/uL (ref 0.0–0.1)
EOS ABS: 0.4 10*3/uL (ref 0.0–0.5)
EOS%: 4.6 % (ref 0.0–7.0)
HEMATOCRIT: 38 % (ref 34.8–46.6)
HEMOGLOBIN: 12.7 g/dL (ref 11.6–15.9)
LYMPH#: 1.3 10*3/uL (ref 0.9–3.3)
LYMPH%: 15.4 % (ref 14.0–49.7)
MCH: 28.9 pg (ref 25.1–34.0)
MCHC: 33.3 g/dL (ref 31.5–36.0)
MCV: 86.8 fL (ref 79.5–101.0)
MONO#: 0.6 10*3/uL (ref 0.1–0.9)
MONO%: 7.2 % (ref 0.0–14.0)
NEUT%: 72 % (ref 38.4–76.8)
NEUTROS ABS: 6.1 10*3/uL (ref 1.5–6.5)
PLATELETS: 378 10*3/uL (ref 145–400)
RBC: 4.39 10*6/uL (ref 3.70–5.45)
RDW: 14.2 % (ref 11.2–14.5)
WBC: 8.5 10*3/uL (ref 3.9–10.3)

## 2015-09-15 LAB — COMPREHENSIVE METABOLIC PANEL (CC13)
ALBUMIN: 3.6 g/dL (ref 3.5–5.0)
ALK PHOS: 87 U/L (ref 40–150)
ALT: 9 U/L (ref 0–55)
ANION GAP: 7 meq/L (ref 3–11)
AST: 15 U/L (ref 5–34)
BILIRUBIN TOTAL: 0.44 mg/dL (ref 0.20–1.20)
BUN: 15.1 mg/dL (ref 7.0–26.0)
CALCIUM: 9.5 mg/dL (ref 8.4–10.4)
CO2: 27 mEq/L (ref 22–29)
Chloride: 108 mEq/L (ref 98–109)
Creatinine: 0.8 mg/dL (ref 0.6–1.1)
EGFR: 87 mL/min/{1.73_m2} — AB (ref >90)
GLUCOSE: 129 mg/dL (ref 70–140)
Potassium: 3.3 mEq/L — ABNORMAL LOW (ref 3.5–5.1)
SODIUM: 142 meq/L (ref 136–145)
TOTAL PROTEIN: 6.7 g/dL (ref 6.4–8.3)

## 2015-09-15 MED ORDER — LETROZOLE 2.5 MG PO TABS: 2.5000 mg | ORAL_TABLET | Freq: Every day | ORAL | Status: DC

## 2015-09-15 MED ORDER — POTASSIUM CHLORIDE CRYS ER 20 MEQ PO TBCR
20.0000 meq | EXTENDED_RELEASE_TABLET | Freq: Two times a day (BID) | ORAL | Status: DC
Start: 2015-09-15 — End: 2016-03-16

## 2015-09-15 NOTE — Progress Notes (Signed)
[facility] Follow Up Visit  [name] [id number] 02-Jul-1941 74 y.o. 09/15/2015 3:08 PM   Principle Diagnosis:[name] 74 y.o. 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     Prior Therapy: 1. Patient underwent a screening mammogram and abnormalities were detected in her breasts. Biopsy was positive for invasive cancer. She underwent a bilateral mastectomy on 01/30/05 by Dr. Excell Seltzer. 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   2. Patient later had recurrence along the left mastectomy site of invasive ductal carcinoma, lymph nodes were negative and it was excised on 04/18/10 with negative margins, she underwent radiation therapy, followed by adjuvant Letrozole   Current therapy:  Letrozole daily  Interim History: [name] 74 y.o. female with h/o invasive ductal carcinoma who is here today for f/u and evaluation. She had a fall in May 2016, and had an elbow fracture. She has recovered well, still has to depend discomfort at the right elbow and limited range of motion. She also had an episode of urinary retention and constipation, secondary to pain medication, and visited ED 2 weeks ago. She has recovered well. She takes mucosal magnesium at night for her constipation. She denies any other pain, dyspnea, abdominal discomfort, or other new symptoms. She tolerating letrozole very well, without noticeable side effects.   Past Medical History  Diagnosis Date  . Diabetes mellitus   . Hyperlipidemia   . Asthma     dx in adulthood (74 y/o) PFT 09/08/09 FEV1 1.70/81%; FEV1/FVC 0.75; +resp to dilartor; NI LV/DLCO  . Breast CA ([place])     hx of bilateral diagnosied in 2006, recurred 2011- [name]  . Colonic polyp    . Osteopenia   . Allergic rhinitis   . OSA (obstructive sleep apnea)     CPAP intolerant  . Peripheral neuropathy ([place])   . COPD (chronic obstructive pulmonary disease) ([place])   . Chronic constipation   . HTN (hypertension) 11-2011  . Dislocation of right elbow 2016  . Urinary retention     During massive constipation (pelvic exam, suspect 8cm or more in diameter)   Medications:  Current Outpatient Prescriptions  Medication Sig Dispense Refill  . aspirin EC 81 MG tablet Take 81 mg by mouth daily.    . beclomethasone (QVAR) 80 MCG/ACT inhaler Inhale 2 puffs into the lungs 2 (two) times daily. 3 Inhaler 3  . cycloSPORINE (RESTASIS) 0.05 % ophthalmic emulsion Place 1 drop into both eyes 2 (two) times daily.    . diphenhydrAMINE (BENADRYL) 25 MG tablet Take 50 mg by mouth daily as needed (swelling).     . EPINEPHrine (EPIPEN 2-PAK) 0.3 mg/0.3 mL IJ SOAJ injection Inject 0.3 mLs (0.3 mg total) into the muscle once. (Patient not taking: Reported on 06/07/2015) 2 Device 1  . glucose blood test strip Check blood sugar daily. DX code: 250.00 (Patient not taking: Reported on 06/07/2015) 100 each 12  . ibuprofen (ADVIL,MOTRIN) 200 MG tablet Take 400 mg by mouth daily as needed for moderate pain.     Marland Kitchen letrozole (FEMARA) 2.5 MG tablet TAKE 1 TABLET (2.5 MG TOTAL) BY MOUTH DAILY. (Patient taking differently: Take 2.5 mg by mouth daily. TAKE 1 TABLET (2.5 MG TOTAL) BY MOUTH DAILY) 30 tablet 5  . levalbuterol (XOPENEX) 0.63 MG/3ML nebulizer solution  Take 0.63 mg by nebulization every 4 (four) hours as needed for wheezing or shortness of breath.    . loratadine (CLARITIN) 10 MG tablet Take 10 mg by mouth daily as needed (seasonal allergies).     . magnesium citrate SOLN Take 1 Bottle by mouth at bedtime as needed for severe constipation (constipation).    . magnesium hydroxide (MILK OF MAGNESIA) 800 MG/5ML suspension Take 30 mLs by mouth at bedtime as needed for constipation.     . metFORMIN (GLUCOPHAGE)  1000 MG tablet Take 1 tablet (1,000 mg total) by mouth daily. 90 tablet 1  . montelukast (SINGULAIR) 10 MG tablet Take 1 tablet (10 mg total) by mouth at bedtime. 30 tablet 6  . Multiple Vitamin (MULTIVITAMIN WITH MINERALS) TABS tablet Take 1 tablet by mouth daily.    . polyvinyl alcohol (LIQUIFILM TEARS) 1.4 % ophthalmic solution Place 1 drop into both eyes 2 (two) times daily as needed for dry eyes.    . simvastatin (ZOCOR) 40 MG tablet Take 1 tablet (40 mg total) by mouth at bedtime. (Patient taking differently: Take 40 mg by mouth daily. ) 30 tablet 5  . triamterene-hydrochlorothiazide (MAXZIDE-25) 37.5-25 MG per tablet Take 1 tablet by mouth daily. 30 tablet 6  . VENTOLIN HFA 108 (90 BASE) MCG/ACT inhaler INHALE 2 PUFFS INTO THE LUNGS EVERY 4 HOURS AS NEEDED FOR WHEEZING OR SHORTNESS OF BREATH (Patient taking differently: INHALE 2 PUFFS INTO THE LUNGS DAILY  AS NEEDED FOR WHEEZING OR SHORTNESS OF BREATH) 18 each 5  . [DISCONTINUED] potassium chloride (K-DUR) 10 MEQ tablet Take 1 tablet (10 mEq total) by mouth daily. 30 tablet 0   No current facility-administered medications for this visit.     Allergies:  Allergies  Allergen Reactions  . Peanut-Containing Drug Products Swelling  . Penicillins Itching    outer rectal itching    Medical History: Past Medical History  Diagnosis Date  . Diabetes mellitus   . Hyperlipidemia   . Asthma     dx in adulthood (74 y/o) PFT 09/08/09 FEV1 1.70/81%; FEV1/FVC 0.75; +resp to dilartor; NI LV/DLCO  . Breast CA ([place])     hx of bilateral diagnosied in 2006, recurred 2011- [name]  . Colonic polyp   . Osteopenia   . Allergic rhinitis   . OSA (obstructive sleep apnea)     CPAP intolerant  . Peripheral neuropathy ([place])   . COPD (chronic obstructive pulmonary disease) ([place])   . Chronic constipation   . HTN (hypertension) 11-2011  . Dislocation of right elbow 2016  . Urinary retention     During massive constipation (pelvic exam,  suspect 8cm or more in diameter)    Surgical History:  Past Surgical History  Procedure Laterality Date  . Appendectomy    . Mastectomy  2006    bilateral  . Pilonidal cyst surgery      ? of  . Flexible sigmoidoscopy N/A 01/31/2013    Procedure: FLEXIBLE SIGMOIDOSCOPY;  Surgeon: [name], MD;  Location: [facility];  Service: Endoscopy;  Laterality: N/A;  unprepped with sedation  . Mastectomy    . Elbow surgery Right 2016    DISLOCATION     Review of Systems: A 10 point review of systems was conducted and is otherwise negative except for what is noted above.    HEALTH MAINTENANCE:  Mammogram 05/06/12, s/p bialteral mastectomy Colonoscopy 12/10/04, flex sig on 01/2013  Bone Density 05/25/2014, osteopenia   Physical Exam: Blood pressure 156/76, pulse  80, temperature 98.7 F (37.1 C), temperature source Oral, resp. rate 18, height _0  (1.6 m), weight 171 lb 3.2 oz (77.656 kg), SpO2 98 %. GENERAL: Patient is a well appearing female in no acute distress HEENT:  Sclerae anicteric.  Oropharynx clear and moist. No ulcerations or evidence of oropharyngeal candidiasis. Neck is supple.  NODES:  No cervical, supraclavicular, or axillary lymphadenopathy palpated.  BREAST EXAM: s/p bilateral mastectomy, no nodularity on chest wall, no sign of recurrence.   LUNGS:  Clear to auscultation bilaterally.  No wheezes or rhonchi. HEART:  Regular rate and rhythm. No murmur appreciated. ABDOMEN:  Soft, nontender.  Positive, normoactive bowel sounds. No organomegaly palpated. MSK:  No focal spinal tenderness to palpation. She did have difficulty with range of motion in her left shoulder.  Full range of motion in right upper extremity. EXTREMITIES:  No peripheral edema.   SKIN:  Clear with no obvious rashes or skin changes. No nail dyscrasia. NEURO:  Nonfocal. Well oriented.  Appropriate affect. ECOG PERFORMANCE STATUS: 1 - Symptomatic but completely ambulatory   Lab Results: CBC Latest  Ref Rng 09/15/2015 09/02/2015 03/16/2015  WBC 3.9 - 10.3 10e3/uL 8.5 10.2 11.0(H)  Hemoglobin 11.6 - 15.9 g/dL 12.7 13.5 13.3  Hematocrit 34.8 - 46.6 % 38.0 41.6 41.2  Platelets 145 - 400 10e3/uL 378 432(H) 346    CMP Latest Ref Rng 09/15/2015 09/02/2015 03/16/2015  Glucose 70 - 140 mg/dl 129 105(H) 112  BUN 7.0 - 26.0 mg/dL 15.1 12 12.4  Creatinine 0.6 - 1.1 mg/dL 0.8 0.60 0.9  Sodium 136 - 145 mEq/L 142 140 140  Potassium 3.5 - 5.1 mEq/L 3.3(L) 3.3(L) 3.7  Chloride 101 - 111 mmol/L - 101 -  CO2 22 - 29 mEq/L _1 Calcium 8.4 - 10.4 mg/dL 9.5 9.7 10.7(H)  Total Protein 6.4 - 8.3 g/dL 6.7 - 7.5  Total Bilirubin 0.20 - 1.20 mg/dL 0.44 - 0.57  Alkaline Phos 40 - 150 U/L 87 - 68  AST 5 - 34 U/L 15 - 17  ALT 0 - 55 U/L 9 - 16       Assessment and Plan: [name] 74 y.o. femalewith  1. ER/PR positive invasive mammary carcinoma in left breast in 2006 with local recurrence in 2011.   -She is status post bilateral mastectomy, on adjuvant letrozole -She is tolerating she is all very well, without significant side effects. -Giving her local recurrent disease, I'll extend her letrozole for longer period of time, likely 10 years. She agrees. She has been on it for 5 years now. -Lab reviewed with her, CBC and CMP within normal limits, except hypokalemia.  2. Hypercalcemia -She will follow-up with her primary care physician -She knows to drink fluids adequately and avoid dehydration -Avoid calcium supplement -PTH was normal in 09/2014 -improved now  3. Osteopenia -she is on multivitamin  -She is not qualified for biphosphonate or Prolia due to her risk of fracture   -Next DEXA scan in June 2017  4. Hypertension, diabetes, COPD -She'll continue follow-up with primary care physician  The patient will return in 6 months for labs and evaluation.   She knows to call us in the interim for any questions or concerns.  We can certainly see her sooner if needed.  I spent 25  minutes counseling the patient face to face.  The total time spent in the appointment was 30 minutes.  Truitt Merle  09/15/2015 3:08 PM

## 2015-09-15 NOTE — Telephone Encounter (Signed)
Gave and printed appt sched and avs for pt for April °

## 2015-09-29 ENCOUNTER — Ambulatory Visit (INDEPENDENT_AMBULATORY_CARE_PROVIDER_SITE_OTHER): Payer: Medicare Other | Admitting: Internal Medicine

## 2015-09-29 ENCOUNTER — Encounter: Payer: Self-pay | Admitting: Internal Medicine

## 2015-09-29 ENCOUNTER — Ambulatory Visit (INDEPENDENT_AMBULATORY_CARE_PROVIDER_SITE_OTHER)
Admission: RE | Admit: 2015-09-29 | Discharge: 2015-09-29 | Disposition: A | Discharge status: Home / Self Care | Payer: Medicare Other | Source: Ambulatory Visit | Attending: Internal Medicine | Admitting: Internal Medicine

## 2015-09-29 VITALS — BP 102/60 | HR 69 | Ht 63.0 in | Wt 173.0 lb

## 2015-09-29 DIAGNOSIS — Z23 Encounter for immunization: Secondary | ICD-10-CM | POA: Diagnosis not present

## 2015-09-29 DIAGNOSIS — Z853 Personal history of malignant neoplasm of breast: Secondary | ICD-10-CM

## 2015-09-29 DIAGNOSIS — G4700 Insomnia, unspecified: Secondary | ICD-10-CM

## 2015-09-29 DIAGNOSIS — K567 Ileus, unspecified: Secondary | ICD-10-CM

## 2015-09-29 DIAGNOSIS — G4730 Sleep apnea, unspecified: Secondary | ICD-10-CM

## 2015-09-29 DIAGNOSIS — J4541 Moderate persistent asthma with (acute) exacerbation: Secondary | ICD-10-CM

## 2015-09-29 DIAGNOSIS — G4733 Obstructive sleep apnea (adult) (pediatric): Secondary | ICD-10-CM

## 2015-09-29 DIAGNOSIS — J45998 Other asthma: Secondary | ICD-10-CM

## 2015-09-29 MED ORDER — FLUTICASONE FUROATE-VILANTEROL 100-25 MCG/INH IN AEPB: 1.0000 | INHALATION_SPRAY | Freq: Every day | RESPIRATORY_TRACT | Status: DC

## 2015-09-29 NOTE — Patient Instructions (Signed)
Flu vax  Order- CXR    Asthmatic bronchitis exacerbation, hx breast CA/ XRT on left  Sample Breo Ellipta 100    Inhale one puff then rinse mouth well, once every day  Order- schedule unattended home sleep test     Dx OSA
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
## 2015-09-30 ENCOUNTER — Other Ambulatory Visit: Payer: Self-pay | Admitting: Internal Medicine

## 2015-10-02 DIAGNOSIS — K567 Ileus, unspecified: Secondary | ICD-10-CM | POA: Insufficient documentation

## 2015-10-02 NOTE — Assessment & Plan Note (Signed)
She did not mention symptoms, but has history of constipation. Message sent to patient and her primary physician.

## 2015-10-02 NOTE — Assessment & Plan Note (Signed)
Acute exacerbation which she relates to an episode of rainy weather Plan-discussed medications. Sample Breo, flu vaccine,cxr

## 2015-10-02 NOTE — Assessment & Plan Note (Signed)
She agrees to reevaluation with unattended home sleep test

## 2015-10-03 ENCOUNTER — Telehealth: Payer: Self-pay | Admitting: Internal Medicine

## 2015-10-03 ENCOUNTER — Ambulatory Visit: Payer: Medicare Other | Admitting: Internal Medicine

## 2015-10-03 NOTE — Telephone Encounter (Signed)
Had a recent chest x-ray, Dr. Annamaria Boots concern about the results, see below. The patient has a history of chronic constipation, if symptoms are at baseline probably don't need to do anything different. Call the patient, [facility], asked for a call back ------- XR Prominent colonic distention. Abdominal series suggested for further evaluation These results will be called to the ordering clinician or representative by the Radiologist Assistant, and communication documented in the PACS or zVision Dashboard.

## 2015-10-03 NOTE — Telephone Encounter (Signed)
Spoke with the patient, she is essentially back to baseline, no fever, chills, or urinary retention.

## 2015-10-04 NOTE — Telephone Encounter (Signed)
Pt was no show 10/03/15 9:30am, acute appt, pt is scheduled to come back 10/07/15 and I see you spoke with her yesterday via phone, charge or no charge?

## 2015-10-04 NOTE — Telephone Encounter (Signed)
No charge please. 

## 2015-10-06 DIAGNOSIS — G4733 Obstructive sleep apnea (adult) (pediatric): Secondary | ICD-10-CM | POA: Diagnosis not present

## 2015-10-07 ENCOUNTER — Ambulatory Visit: Payer: Medicare Other | Admitting: Internal Medicine

## 2015-10-07 ENCOUNTER — Ambulatory Visit (INDEPENDENT_AMBULATORY_CARE_PROVIDER_SITE_OTHER): Payer: Medicare Other | Admitting: Internal Medicine

## 2015-10-07 ENCOUNTER — Encounter: Payer: Self-pay | Admitting: Internal Medicine

## 2015-10-07 ENCOUNTER — Telehealth: Payer: Self-pay | Admitting: Internal Medicine

## 2015-10-07 VITALS — BP 124/76 | HR 73 | Temp 98.1°F | Ht 63.0 in | Wt 169.0 lb

## 2015-10-07 DIAGNOSIS — R634 Abnormal weight loss: Secondary | ICD-10-CM

## 2015-10-07 DIAGNOSIS — E119 Type 2 diabetes mellitus without complications: Secondary | ICD-10-CM | POA: Diagnosis not present

## 2015-10-07 DIAGNOSIS — E785 Hyperlipidemia, unspecified: Secondary | ICD-10-CM

## 2015-10-07 DIAGNOSIS — I10 Essential (primary) hypertension: Secondary | ICD-10-CM | POA: Diagnosis not present

## 2015-10-07 DIAGNOSIS — Z09 Encounter for follow-up examination after completed treatment for conditions other than malignant neoplasm: Secondary | ICD-10-CM

## 2015-10-07 LAB — BASIC METABOLIC PANEL
BUN: 14 mg/dL (ref 6–23)
CHLORIDE: 104 meq/L (ref 96–112)
CO2: 25 meq/L (ref 19–32)
Calcium: 10.4 mg/dL (ref 8.4–10.5)
Creatinine, Ser: 0.67 mg/dL (ref 0.40–1.20)
GFR: 110.93 mL/min (ref >60.00)
GLUCOSE: 106 mg/dL — AB (ref 70–99)
POTASSIUM: 3.9 meq/L (ref 3.5–5.1)
SODIUM: 141 meq/L (ref 135–145)

## 2015-10-07 LAB — LIPID PANEL
CHOLESTEROL: 181 mg/dL (ref 0–200)
HDL: 61 mg/dL (ref >39.00)
LDL CALC: 101 mg/dL — AB (ref 0–99)
NonHDL: 120.04
TRIGLYCERIDES: 96 mg/dL (ref 0.0–149.0)
Total CHOL/HDL Ratio: 3
VLDL: 19.2 mg/dL (ref 0.0–40.0)

## 2015-10-07 LAB — HEMOGLOBIN A1C: Hgb A1c MFr Bld: 6.2 % (ref 4.6–6.5)

## 2015-10-07 LAB — TSH: TSH: 0.86 u[IU]/mL (ref 0.35–4.50)

## 2015-10-07 NOTE — Assessment & Plan Note (Addendum)
   DM: Check A1c, continue with metformin, she is losing weight, consider discontinue metformin as A1c probably better HTN: Well-controlled, potassium supplements, check a BMP Hyperlipidemia: Check FLP. Continue statins Weight loss: Decreased from 205 pound (2011) to184 pounds last year to 169 pounds in a year. Weight loss has a very gradual. No anemia, no depression, no concerning symptoms such as night sweats; did complain of some indigestion, belching.. Due to physical inactivity and muscle mass loss?Marland Kitchen Check a TSH, empiric Prilosec for GI sx (belching) and reassess in 3-4 months. Acute urinary retention: Resolved. RTC 3 -4 months for a CPX

## 2015-10-07 NOTE — Progress Notes (Signed)
Pre visit review using our clinic review tool, if applicable. No additional management support is needed unless otherwise documented below in the visit note. 

## 2015-10-07 NOTE — Progress Notes (Addendum)
Subjective:    Patient ID: [name], female    DOB: [birth year], 74 y.o.   MRN: [id number]  DOS:  10/07/2015 Type of visit - description : Routine follow-up Interval history: Hypertension: Good compliance of medication including a potassium supplement. Asthma: Having more problems with cough, recently saw pulmonary. Diabetes: On metformin, reports blood sugars are very good, readings?. Not able to exercise much, diet is regular. Her main concern is weight loss, 184 pounds a year ago, today 169   Review of Systems  Denies fever, chills or night sweats. No lower extremity edema No blood in the stools, frequently has epigastric pain associated with belching. No dysphagia or odynophagia. Denies depression or anxiety.  Past Medical History  Diagnosis Date  . Diabetes mellitus   . Hyperlipidemia   . Asthma     dx in adulthood (74 y/o) PFT 09/08/09 FEV1 1.70/81%; FEV1/FVC 0.75; +resp to dilartor; NI LV/DLCO  . Breast CA ([place])     hx of bilateral diagnosied in 2006, recurred 2011- [name]  . Colonic polyp   . Osteopenia   . Allergic rhinitis   . OSA (obstructive sleep apnea)     CPAP intolerant  . Peripheral neuropathy ([place])   . COPD (chronic obstructive pulmonary disease) ([place])   . Chronic constipation   . HTN (hypertension) 11-2011  . Dislocation of right elbow 2016  . Urinary retention     During massive constipation (pelvic exam, suspect 8cm or more in diameter)    Past Surgical History  Procedure Laterality Date  . Appendectomy    . Mastectomy  2006    bilateral  . Pilonidal cyst surgery      ? of  . Flexible sigmoidoscopy N/A 01/31/2013    Procedure: FLEXIBLE SIGMOIDOSCOPY;  Surgeon: [name], MD;  Location: [facility];  Service: Endoscopy;  Laterality: N/A;  unprepped with sedation  . Mastectomy    . Elbow surgery Right 2016    DISLOCATION    Social History   Social History  . Marital Status: Widowed    Spouse Name:  N/A  . Number of Children: 2  . Years of Education: N/A   Occupational History  . runs a non-profit    Social History Main Topics  . Smoking status: Former Smoker -- 1.00 packs/day for 4 years    Types: Cigarettes    Quit date: 04/01/1968  . Smokeless tobacco: Never Used     Comment: Quit at age 37  . Alcohol Use: No  . Drug Use: No  . Sexual Activity: Not Currently   Other Topics Concern  . Not on file   Social History Narrative   Widowed last husband 4-10, lives alone, retired Pharmacist, hospital   2 children, Deale twins          Medication List       This list is accurate as of: 10/07/15  1:51 PM.  Always use your most recent med list.               aspirin EC 81 MG tablet  Take 81 mg by mouth daily.     beclomethasone 80 MCG/ACT inhaler  Commonly known as:  QVAR  Inhale 2 puffs into the lungs 2 (two) times daily.     diphenhydrAMINE 25 MG tablet  Commonly known as:  BENADRYL  Take 50 mg by mouth daily as needed (swelling).     EPINEPHrine 0.3 mg/0.3 mL Soaj injection  Commonly known as:  EPIPEN  2-PAK  Inject 0.3 mLs (0.3 mg total) into the muscle once.     Fluticasone Furoate-Vilanterol 100-25 MCG/INH Aepb  Commonly known as:  BREO ELLIPTA  Inhale 1 puff into the lungs daily.     glucose blood test strip  Check blood sugar daily. DX code: 250.00     ibuprofen 200 MG tablet  Commonly known as:  ADVIL,MOTRIN  Take 400 mg by mouth daily as needed for moderate pain.     letrozole 2.5 MG tablet  Commonly known as:  FEMARA  Take 1 tablet (2.5 mg total) by mouth daily. TAKE 1 TABLET (2.5 MG TOTAL) BY MOUTH DAILY     levalbuterol 0.63 MG/3ML nebulizer solution  Commonly known as:  XOPENEX  Take 0.63 mg by nebulization every 4 (four) hours as needed for wheezing or shortness of breath.     loratadine 10 MG tablet  Commonly known as:  CLARITIN  Take 10 mg by mouth daily as needed (seasonal allergies).     magnesium citrate Soln  Take 1 Bottle by mouth at bedtime  as needed for severe constipation (constipation).     magnesium hydroxide 800 MG/5ML suspension  Commonly known as:  MILK OF MAGNESIA  Take 30 mLs by mouth at bedtime as needed for constipation.     metFORMIN 1000 MG tablet  Commonly known as:  GLUCOPHAGE  Take 1 tablet (1,000 mg total) by mouth daily.     multivitamin with minerals Tabs tablet  Take 1 tablet by mouth daily.     polyvinyl alcohol 1.4 % ophthalmic solution  Commonly known as:  LIQUIFILM TEARS  Place 1 drop into both eyes 2 (two) times daily as needed for dry eyes.     potassium chloride SA 20 MEQ tablet  Commonly known as:  K-DUR,KLOR-CON  Take 1 tablet (20 mEq total) by mouth 2 (two) times daily.     simvastatin 40 MG tablet  Commonly known as:  ZOCOR  Take 1 tablet (40 mg total) by mouth at bedtime.     triamterene-hydrochlorothiazide 37.5-25 MG tablet  Commonly known as:  MAXZIDE-25  Take 1 tablet by mouth daily.     VENTOLIN HFA 108 (90 BASE) MCG/ACT inhaler  Generic drug:  albuterol  INHALE 2 PUFFS INTO THE LUNGS EVERY 4 HOURS AS NEEDED FOR WHEEZING OR SHORTNESS OF BREATH           Objective:   Physical Exam BP 124/76 mmHg  Pulse 73  Temp(Src) 98.1 F (36.7 C) (Oral)  Ht 5\' 3"  (1.6 m)  Wt 169 lb (76.658 kg)  BMI 29.94 kg/m2  SpO2 98% General:   Well developed, well nourished . NAD.  HEENT:  Normocephalic . Face symmetric, atraumatic. Neck: No mass or LAD. No thyromegaly Lungs:  CTA B Normal respiratory effort, no intercostal retractions, no accessory muscle use. Heart: RRR,  no murmur.  no pretibial edema bilaterally  Abdomen:  Not distended, soft, non-tender. No rebound or rigidity. Axillary area: No LAD or mass.  Skin: Not pale. Not jaundice Neurologic:  alert & oriented X3.  Speech normal, gait appropriate for age and unassisted Psych--  Cognition and judgment appear intact.  Cooperative with normal attention span and concentration.  Behavior appropriate. No anxious or  depressed appearing.    Assessment & Plan:   Assessment >  DM HTN Hyperlipidemia Chronic, severe constipation: s/p several colonoscopies, very limited d/p poor prep unable to finish exam . Virtual CT 2011 unrevealing . Usually good response to MOM, admitted  2014 d/t impaction. Last visit Dr.Outlow 2014 Acute urinary retention 08/22/2015 Pulmonary --COPD --Asthma --OSA CPAP intolerant Osteopenia Oncology Breast cancer bilateral 2006, s/ p B mastectomy r Recurrence L mastectomy site  2011, s/p excision >> XRT, Letrozole   Plan: DM: Check A1c, continue with metformin, she is losing weight, consider discontinue metformin as A1c probably better HTN: Well-controlled, potassium supplements, check a BMP Hyperlipidemia: Check FLP. Continue statins Weight loss: Decreased from 205 pound (2011) to184 pounds last year to 169 pounds in a year. Weight loss has a very gradual. No anemia, no depression, no concerning symptoms such as night sweats; did complain of some indigestion, belching.. Due to physical inactivity and muscle mass loss?Marland Kitchen Check a TSH, empiric Prilosec for GI sx (belching) and reassess in 3-4 months. Acute urinary retention: Resolved. RTC 3 -4 months for a CPX

## 2015-10-07 NOTE — Patient Instructions (Addendum)
Get your blood work before you leave      Next visit  for a   physical exam in 3-4 months , not fasting. Please schedule an appointment at the front desk

## 2015-10-13 NOTE — Telephone Encounter (Signed)
Pt was no show 10/07/15 8:30am for follow up appt. Pt came in later same day at 11:15am. Charge or no charge?

## 2015-10-14 ENCOUNTER — Other Ambulatory Visit: Payer: Self-pay | Admitting: Gastroenterology

## 2015-10-14 DIAGNOSIS — Z86010 Personal history of colonic polyps: Secondary | ICD-10-CM

## 2015-10-14 DIAGNOSIS — G4733 Obstructive sleep apnea (adult) (pediatric): Secondary | ICD-10-CM | POA: Diagnosis not present

## 2015-10-14 NOTE — Telephone Encounter (Signed)
No charge. 

## 2015-10-17 ENCOUNTER — Other Ambulatory Visit: Payer: Self-pay | Admitting: *Deleted

## 2015-10-17 DIAGNOSIS — G4733 Obstructive sleep apnea (adult) (pediatric): Secondary | ICD-10-CM

## 2015-10-20 ENCOUNTER — Ambulatory Visit (INDEPENDENT_AMBULATORY_CARE_PROVIDER_SITE_OTHER): Payer: Medicare Other | Admitting: Internal Medicine

## 2015-10-20 ENCOUNTER — Encounter: Payer: Self-pay | Admitting: Internal Medicine

## 2015-10-20 VITALS — BP 122/62 | HR 72 | Ht 63.0 in | Wt 176.0 lb

## 2015-10-20 DIAGNOSIS — K5909 Other constipation: Secondary | ICD-10-CM | POA: Diagnosis not present

## 2015-10-20 DIAGNOSIS — J45998 Other asthma: Secondary | ICD-10-CM | POA: Diagnosis not present

## 2015-10-20 DIAGNOSIS — G4700 Insomnia, unspecified: Secondary | ICD-10-CM

## 2015-10-20 DIAGNOSIS — G4733 Obstructive sleep apnea (adult) (pediatric): Secondary | ICD-10-CM

## 2015-10-20 DIAGNOSIS — F5104 Psychophysiologic insomnia: Secondary | ICD-10-CM

## 2015-10-20 MED ORDER — SUVOREXANT 15 MG PO TABS: 1.0000 | ORAL_TABLET | Freq: Every day | ORAL | Status: DC

## 2015-10-20 MED ORDER — FLUTICASONE FUROATE-VILANTEROL 100-25 MCG/INH IN AEPB: 1.0000 | INHALATION_SPRAY | Freq: Every day | RESPIRATORY_TRACT | Status: DC

## 2015-10-20 MED ORDER — SUVOREXANT 15 MG PO TABS: 15.0000 mg | ORAL_TABLET | Freq: Every evening | ORAL | Status: DC | PRN

## 2015-10-20 MED ORDER — FLUTICASONE FUROATE-VILANTEROL 100-25 MCG/INH IN AEPB: INHALATION_SPRAY | RESPIRATORY_TRACT | Status: DC

## 2015-10-20 NOTE — Patient Instructions (Addendum)
Script and sample Belsomra 15 mg- try at bedtime for sleep as needed  Referral to orthodontics/ Dr Ron Parker    Consider oral appliance for OSA  Sample and script Breo 100  Maintenance controller inhaler    Inhale 1 puff, then rinse mouth, once daily
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
## 2015-10-21 NOTE — Assessment & Plan Note (Signed)
We discussed alternatives in the context of OSA Plan-try Belsomra 15 mg, reinforce good sleep hygiene

## 2015-10-21 NOTE — Assessment & Plan Note (Addendum)
She still has a significant obstructive sleep apnea with diagnostic study reinforced by observation of her GI doctor. She did not tolerate CPAP with repeated efforts in the past. We discussed alternatives and she is interested in learning more about oral appliances for this diagnosis. Plan-referral to Dr. Ron Parker to consider oral appliance

## 2015-10-21 NOTE — Assessment & Plan Note (Addendum)
Mild to moderate persistent. Needed to use her rescue inhaler at this visit after being outdoors. Plan-prescription for Breo100 Ellipta maintenance controller with discussion

## 2015-10-21 NOTE — Assessment & Plan Note (Signed)
Gas-distended bowel noted on recent chest x-ray. GI evaluation is underway.

## 2015-10-27 ENCOUNTER — Inpatient Hospital Stay: Admission: RE | Admit: 2015-10-27 | Payer: Medicare Other | Source: Ambulatory Visit

## 2015-11-11 ENCOUNTER — Ambulatory Visit
Admission: RE | Admit: 2015-11-11 | Discharge: 2015-11-11 | Disposition: A | Discharge status: Home / Self Care | Payer: Medicare Other | Source: Ambulatory Visit | Attending: Gastroenterology | Admitting: Gastroenterology

## 2015-11-11 DIAGNOSIS — Z86010 Personal history of colonic polyps: Secondary | ICD-10-CM

## 2015-11-15 ENCOUNTER — Other Ambulatory Visit: Payer: Self-pay | Admitting: Gastroenterology

## 2015-11-15 DIAGNOSIS — Z86010 Personal history of colonic polyps: Secondary | ICD-10-CM

## 2015-11-28 ENCOUNTER — Other Ambulatory Visit: Payer: Medicare Other

## 2015-11-29 ENCOUNTER — Telehealth: Payer: Self-pay | Admitting: Internal Medicine

## 2015-11-29 ENCOUNTER — Ambulatory Visit (INDEPENDENT_AMBULATORY_CARE_PROVIDER_SITE_OTHER)
Admission: RE | Admit: 2015-11-29 | Discharge: 2015-11-29 | Disposition: A | Discharge status: Home / Self Care | Payer: Medicare Other | Source: Ambulatory Visit | Attending: Internal Medicine | Admitting: Internal Medicine

## 2015-11-29 DIAGNOSIS — R042 Hemoptysis: Secondary | ICD-10-CM

## 2015-11-29 DIAGNOSIS — J40 Bronchitis, not specified as acute or chronic: Secondary | ICD-10-CM | POA: Diagnosis not present

## 2015-11-29 MED ORDER — AZITHROMYCIN 250 MG PO TABS: ORAL_TABLET | ORAL | Status: DC

## 2015-11-29 NOTE — Telephone Encounter (Signed)
Spoke with pt, aware of recs.  rx sent to preferred pharmacy.  cxr ordered.  Nothing further needed at this time.

## 2015-11-29 NOTE — Telephone Encounter (Signed)
Offer Z pak  Outpatient CXR     Dx acute bronchits, hemoptysis

## 2015-11-29 NOTE — Telephone Encounter (Signed)
Called spoke with pt. She reports she started having a cough yesterday. Now today, she has started coughing up blood. It is bright red blood mixed with yellow phlem and gets this up every time she coughs. Denies any f/c/s/n/v.  Pt is requesting recs. Please advise Dr. Annamaria Boots thanks  Allergies  Allergen Reactions  . Peanut-Containing Drug Products Swelling  . Penicillins Itching    outer rectal itching     Current Outpatient Prescriptions on File Prior to Visit  Medication Sig Dispense Refill  . aspirin EC 81 MG tablet Take 81 mg by mouth daily.    . beclomethasone (QVAR) 80 MCG/ACT inhaler Inhale 2 puffs into the lungs 2 (two) times daily. (Patient not taking: Reported on 10/20/2015) 3 Inhaler 3  . diphenhydrAMINE (BENADRYL) 25 MG tablet Take 50 mg by mouth daily as needed (swelling).     . EPINEPHrine (EPIPEN 2-PAK) 0.3 mg/0.3 mL IJ SOAJ injection Inject 0.3 mLs (0.3 mg total) into the muscle once. 2 Device 1  . Fluticasone Furoate-Vilanterol (BREO ELLIPTA) 100-25 MCG/INH AEPB Inhale 1 puff into the lungs daily. (Patient not taking: Reported on 10/20/2015) 1 each 0  . Fluticasone Furoate-Vilanterol (BREO ELLIPTA) 100-25 MCG/INH AEPB Inhale 1 puff then rinse mouth, once daily 60 each 11  . Fluticasone Furoate-Vilanterol (BREO ELLIPTA) 100-25 MCG/INH AEPB Inhale 1 puff into the lungs daily. 1 each 0  . glucose blood test strip Check blood sugar daily. DX code: 250.00 100 each 12  . ibuprofen (ADVIL,MOTRIN) 200 MG tablet Take 400 mg by mouth daily as needed for moderate pain.     Marland Kitchen letrozole (FEMARA) 2.5 MG tablet Take 1 tablet (2.5 mg total) by mouth daily. TAKE 1 TABLET (2.5 MG TOTAL) BY MOUTH DAILY 90 tablet 1  . levalbuterol (XOPENEX) 0.63 MG/3ML nebulizer solution Take 0.63 mg by nebulization every 4 (four) hours as needed for wheezing or shortness of breath.    . loratadine (CLARITIN) 10 MG tablet Take 10 mg by mouth daily as needed (seasonal allergies).     . magnesium citrate SOLN Take  1 Bottle by mouth at bedtime as needed for severe constipation (constipation).    . magnesium hydroxide (MILK OF MAGNESIA) 800 MG/5ML suspension Take 30 mLs by mouth at bedtime as needed for constipation.     . metFORMIN (GLUCOPHAGE) 1000 MG tablet Take 1 tablet (1,000 mg total) by mouth daily. 90 tablet 1  . Multiple Vitamin (MULTIVITAMIN WITH MINERALS) TABS tablet Take 1 tablet by mouth daily.    . polyvinyl alcohol (LIQUIFILM TEARS) 1.4 % ophthalmic solution Place 1 drop into both eyes 2 (two) times daily as needed for dry eyes.    . potassium chloride SA (K-DUR,KLOR-CON) 20 MEQ tablet Take 1 tablet (20 mEq total) by mouth 2 (two) times daily. 30 tablet 0  . simvastatin (ZOCOR) 40 MG tablet Take 1 tablet (40 mg total) by mouth at bedtime. (Patient taking differently: Take 40 mg by mouth daily. ) 30 tablet 5  . Suvorexant (BELSOMRA) 15 MG TABS Take 15 mg by mouth at bedtime as needed. 30 tablet 5  . Suvorexant (BELSOMRA) 15 MG TABS Take 1 tablet by mouth daily. 6 tablet 0  . triamterene-hydrochlorothiazide (MAXZIDE-25) 37.5-25 MG per tablet Take 1 tablet by mouth daily. 30 tablet 6  . VENTOLIN HFA 108 (90 BASE) MCG/ACT inhaler INHALE 2 PUFFS INTO THE LUNGS EVERY 4 HOURS AS NEEDED FOR WHEEZING OR SHORTNESS OF BREATH 18 Inhaler 4  . [DISCONTINUED] potassium chloride (K-DUR) 10 MEQ tablet  Take 1 tablet (10 mEq total) by mouth daily. 30 tablet 0   No current facility-administered medications on file prior to visit.

## 2015-12-11 ENCOUNTER — Other Ambulatory Visit: Payer: Self-pay | Admitting: Internal Medicine

## 2015-12-17 ENCOUNTER — Other Ambulatory Visit: Payer: Self-pay | Admitting: Internal Medicine

## 2016-01-02 ENCOUNTER — Ambulatory Visit (INDEPENDENT_AMBULATORY_CARE_PROVIDER_SITE_OTHER): Payer: Medicare Other | Admitting: Internal Medicine

## 2016-01-02 ENCOUNTER — Encounter: Payer: Self-pay | Admitting: Internal Medicine

## 2016-01-02 VITALS — BP 128/68 | HR 74 | Ht 63.0 in | Wt 182.8 lb

## 2016-01-02 DIAGNOSIS — K567 Ileus, unspecified: Secondary | ICD-10-CM

## 2016-01-02 DIAGNOSIS — R05 Cough: Secondary | ICD-10-CM | POA: Diagnosis not present

## 2016-01-02 DIAGNOSIS — J4541 Moderate persistent asthma with (acute) exacerbation: Secondary | ICD-10-CM

## 2016-01-02 MED ORDER — PROMETHAZINE-CODEINE 6.25-10 MG/5ML PO SYRP: 5.0000 mL | ORAL_SOLUTION | Freq: Four times a day (QID) | ORAL | Status: DC | PRN

## 2016-01-02 MED ORDER — DOXYCYCLINE HYCLATE 100 MG PO TABS: 100.0000 mg | ORAL_TABLET | Freq: Two times a day (BID) | ORAL | Status: DC

## 2016-01-02 NOTE — Assessment & Plan Note (Signed)
Slow to resolve asthmatic bronchitis. Initial infection was probably viral. Plan-hydration, doxycycline, cough syrup, xopenex here

## 2016-01-02 NOTE — Progress Notes (Signed)
Patient ID: [name] Nurse, female    DOB: [birth year], 75 y.o.   MRN: [id number]  HPI  10/20/15- 75 yoF former smoker followed for OSA/ failed CPAP, asthma, allergic rhinitis, Food Allergy, GERD, complicated by DM, hx bilateral breast cancer/ XRT to left breast Follows for OSA, allergies and asthma. Pt states that her breathing and allergies are doing well. Pt c/o waking at night and not being able to get back to sleep.  Unattended Home Sleep Test-10/06/2015- severe OSA, AHI 23.3 per hour, weight 173 pounds, desaturation to 77% Follows asleep easily. During evaluation for GI problems her Dr. commented on witnessed apnea and advised her to update status. CXR 09/29/15 IMPRESSION: 1. No acute cardiopulmonary disease. Mild cardiomegaly. No pulmonary venous congestion. 2. Prominent colonic distention. Abdominal series suggested for further evaluation These results will be called to the ordering clinician or representative by the Radiologist Assistant, and communication documented in the PACS or zVision Dashboard. Electronically Signed  By: Marcello Moores Register  On: 09/29/2015 11:59  01/02/2016-75 year old female former smoker followed for OSA/failed CPAP, asthma, allergic rhinitis, food allergy, GERD, complicated by DM, history of bilateral breast cancer/XRT to left breast FOLLOWS FOR: pt c/o unchanged prod cough with yellow mucus X3 weeks- was given zpak for this by our office, symptoms unchanged.  states she feels like she cannot take a deep breath in. Had hemoptysis with this at outset. No more blood. Chest x-ray was unremarkable as reviewed. Occasional rescue inhaler. CXR 11/29/2015 IMPRESSION: No active cardiopulmonary disease. Electronically Signed  By: Lovey Newcomer M.D.  On: 11/29/2015 13:58  Review of Systems-See HPI Constitutional:   No-   weight loss, night sweats, fevers, chills, +fatigue, lassitude. HEENT:   No-  headaches, difficulty swallowing, tooth/dental  problems,  sore throat,       No-  sneezing, itching, +ear ache, + nasal congestion, +post nasal drip,  CV:  No-   chest pain, orthopnea, PND, swelling in lower extremities, anasarca, dizziness, palpitations Resp: + shortness of breath with exertion or at rest.              +  productive cough, + non-productive cough,  No- coughing up of blood.              +  change in color of mucus.  + Wheezing.   Skin: No-   rash or lesions. GI:  +   heartburn, indigestion, no-abdominal pain, nausea, vomiting, GU:  MS:  No-   joint pain or swelling. . Neuro-     nothing unusual Psych:  No- change in mood or affect. No depression or anxiety.  No memory loss.  Objective:   Physical Exam General- Alert, Oriented, Affect-appropriate, Distress- none acute    + Overweight.  Skin- clear Lymphadenopathy- none Head- atraumatic            Eyes- Gross vision intact, PERRLA, conjunctivae clear            Ears- Hearing grossly intact            Nose- Clear, no-Septal dev, mucus, polyps, erosion, perforation             Throat- Mallampati III , mucosa clear , drainage- none, tonsils- atrophic Neck- flexible , trachea midline, no stridor , thyroid nl, carotid no bruit Chest - symmetrical excursion , unlabored           Heart/CV- RRR , no murmur , no gallop  , no rub, nl s1 s2                           -  JVD- none , edema- none, stasis changes- none, varices- none           Lung- wheeze+, cough-+ wheezy, unlabored,  dullness-none, rub- none           Chest wall-  Abd-  Br/ Gen/ Rectal- Not done, not indicated Extrem- cyanosis- none, clubbing, none, atrophy- none, strength- nl, +cane Neuro- grossly intact to observation

## 2016-01-02 NOTE — Patient Instructions (Addendum)
Scripts printed for doxycycline antibiotic and for cough syrup  Neb xop 0.63   Ok to keep March 10 appointment

## 2016-01-02 NOTE — Assessment & Plan Note (Signed)
Observation only-chest x-rays continue to show increased gas in colon

## 2016-01-11 ENCOUNTER — Emergency Department (HOSPITAL_COMMUNITY): Payer: Medicare Other

## 2016-01-11 ENCOUNTER — Emergency Department (HOSPITAL_COMMUNITY)
Admission: EM | Admit: 2016-01-11 | Discharge: 2016-01-11 | Disposition: A | Discharge status: Home / Self Care | Payer: Medicare Other | Attending: Emergency Medicine | Admitting: Emergency Medicine

## 2016-01-11 ENCOUNTER — Encounter (HOSPITAL_COMMUNITY): Payer: Self-pay | Admitting: *Deleted

## 2016-01-11 DIAGNOSIS — K5900 Constipation, unspecified: Secondary | ICD-10-CM | POA: Diagnosis not present

## 2016-01-11 DIAGNOSIS — S3991XA Unspecified injury of abdomen, initial encounter: Secondary | ICD-10-CM | POA: Insufficient documentation

## 2016-01-11 DIAGNOSIS — Z87891 Personal history of nicotine dependence: Secondary | ICD-10-CM | POA: Diagnosis not present

## 2016-01-11 DIAGNOSIS — S60032A Contusion of left middle finger without damage to nail, initial encounter: Secondary | ICD-10-CM | POA: Diagnosis not present

## 2016-01-11 DIAGNOSIS — I10 Essential (primary) hypertension: Secondary | ICD-10-CM | POA: Diagnosis not present

## 2016-01-11 DIAGNOSIS — Y92410 Unspecified street and highway as the place of occurrence of the external cause: Secondary | ICD-10-CM | POA: Diagnosis not present

## 2016-01-11 DIAGNOSIS — J449 Chronic obstructive pulmonary disease, unspecified: Secondary | ICD-10-CM | POA: Insufficient documentation

## 2016-01-11 DIAGNOSIS — Y999 Unspecified external cause status: Secondary | ICD-10-CM | POA: Diagnosis not present

## 2016-01-11 DIAGNOSIS — Z86010 Personal history of colonic polyps: Secondary | ICD-10-CM | POA: Diagnosis not present

## 2016-01-11 DIAGNOSIS — S80212A Abrasion, left knee, initial encounter: Secondary | ICD-10-CM | POA: Diagnosis not present

## 2016-01-11 DIAGNOSIS — Z7951 Long term (current) use of inhaled steroids: Secondary | ICD-10-CM | POA: Insufficient documentation

## 2016-01-11 DIAGNOSIS — Z792 Long term (current) use of antibiotics: Secondary | ICD-10-CM | POA: Insufficient documentation

## 2016-01-11 DIAGNOSIS — E785 Hyperlipidemia, unspecified: Secondary | ICD-10-CM | POA: Diagnosis not present

## 2016-01-11 DIAGNOSIS — Z79899 Other long term (current) drug therapy: Secondary | ICD-10-CM | POA: Insufficient documentation

## 2016-01-11 DIAGNOSIS — G4733 Obstructive sleep apnea (adult) (pediatric): Secondary | ICD-10-CM | POA: Diagnosis not present

## 2016-01-11 DIAGNOSIS — E119 Type 2 diabetes mellitus without complications: Secondary | ICD-10-CM | POA: Diagnosis not present

## 2016-01-11 DIAGNOSIS — Z87828 Personal history of other (healed) physical injury and trauma: Secondary | ICD-10-CM | POA: Insufficient documentation

## 2016-01-11 DIAGNOSIS — Z7982 Long term (current) use of aspirin: Secondary | ICD-10-CM | POA: Insufficient documentation

## 2016-01-11 DIAGNOSIS — Z9981 Dependence on supplemental oxygen: Secondary | ICD-10-CM | POA: Diagnosis not present

## 2016-01-11 DIAGNOSIS — S20312A Abrasion of left front wall of thorax, initial encounter: Secondary | ICD-10-CM | POA: Diagnosis not present

## 2016-01-11 DIAGNOSIS — Y9389 Activity, other specified: Secondary | ICD-10-CM | POA: Insufficient documentation

## 2016-01-11 DIAGNOSIS — Z8739 Personal history of other diseases of the musculoskeletal system and connective tissue: Secondary | ICD-10-CM | POA: Diagnosis not present

## 2016-01-11 DIAGNOSIS — Z853 Personal history of malignant neoplasm of breast: Secondary | ICD-10-CM | POA: Insufficient documentation

## 2016-01-11 DIAGNOSIS — Z7984 Long term (current) use of oral hypoglycemic drugs: Secondary | ICD-10-CM | POA: Diagnosis not present

## 2016-01-11 DIAGNOSIS — S299XXA Unspecified injury of thorax, initial encounter: Secondary | ICD-10-CM | POA: Diagnosis present

## 2016-01-11 DIAGNOSIS — Z880 Allergy status to penicillin: Secondary | ICD-10-CM | POA: Insufficient documentation

## 2016-01-11 LAB — CBC
HCT: 46.5 % — ABNORMAL HIGH (ref 36.0–46.0)
HEMOGLOBIN: 15.8 g/dL — AB (ref 12.0–15.0)
MCH: 30.4 pg (ref 26.0–34.0)
MCHC: 34 g/dL (ref 30.0–36.0)
MCV: 89.4 fL (ref 78.0–100.0)
Platelets: 311 10*3/uL (ref 150–400)
RBC: 5.2 MIL/uL — AB (ref 3.87–5.11)
RDW: 13.8 % (ref 11.5–15.5)
WBC: 11.3 10*3/uL — AB (ref 4.0–10.5)

## 2016-01-11 LAB — BASIC METABOLIC PANEL
ANION GAP: 12 (ref 5–15)
BUN: 14 mg/dL (ref 6–20)
CALCIUM: 10.5 mg/dL — AB (ref 8.9–10.3)
CO2: 26 mmol/L (ref 22–32)
Chloride: 99 mmol/L — ABNORMAL LOW (ref 101–111)
Creatinine, Ser: 0.85 mg/dL (ref 0.44–1.00)
Glucose, Bld: 109 mg/dL — ABNORMAL HIGH (ref 65–99)
Potassium: 4.3 mmol/L (ref 3.5–5.1)
Sodium: 137 mmol/L (ref 135–145)

## 2016-01-11 LAB — URINE MICROSCOPIC-ADD ON

## 2016-01-11 LAB — URINALYSIS, ROUTINE W REFLEX MICROSCOPIC
BILIRUBIN URINE: NEGATIVE
Glucose, UA: NEGATIVE mg/dL
Ketones, ur: NEGATIVE mg/dL
LEUKOCYTES UA: NEGATIVE
NITRITE: NEGATIVE
Protein, ur: NEGATIVE mg/dL
SPECIFIC GRAVITY, URINE: 1.008 (ref 1.005–1.030)
pH: 8 (ref 5.0–8.0)

## 2016-01-11 MED ORDER — IOHEXOL 300 MG/ML  SOLN
100.0000 mL | Freq: Once | INTRAMUSCULAR | Status: AC | PRN
  Administered 2016-01-11: 100 mL via INTRAVENOUS

## 2016-01-11 MED ORDER — OXYCODONE-ACETAMINOPHEN 5-325 MG PO TABS
1.0000 | ORAL_TABLET | Freq: Once | ORAL | Status: AC
  Administered 2016-01-11: 1 via ORAL
  Filled 2016-01-11: qty 1

## 2016-01-11 MED ORDER — METHOCARBAMOL 500 MG PO TABS: 500.0000 mg | ORAL_TABLET | Freq: Two times a day (BID) | ORAL | Status: DC

## 2016-01-11 MED ORDER — IBUPROFEN 800 MG PO TABS: 800.0000 mg | ORAL_TABLET | Freq: Three times a day (TID) | ORAL | Status: DC

## 2016-01-11 NOTE — ED Notes (Signed)
PA Riley at bedside.  

## 2016-01-11 NOTE — ED Notes (Signed)
Patient transported to CT 

## 2016-01-11 NOTE — ED Notes (Signed)
MD at bedside. 

## 2016-01-11 NOTE — Discharge Instructions (Addendum)
Ms. [name],  Nice meeting you! Please follow-up with your primary care provider and orthopedics. You may take ibuprofen and robaxin for your pain. Do not drive while on robaxin, as it may make you dizzy/loopy. Return to the emergency department if you develop increasing pain, shortness of breath. Feel better soon!  S. Wendie Simmer, PA-C    Outpatient Metformin Instructions (Glucophage, Glucovance, Fortamet, Riomet, Metaglip, Glumetza, Actoplus met  Avandamet, Janumet)   Patient: [name]                                                01/11/2016:    Radiology Exam:  Ct chest, abdomen, pelvis with IV contrast   As part of your exam today in the Radiology Department, you were given a radiographic contrast material or x-ray dye.  Because you have had this contrast material and you are taking a Metformin drug (Glucophage, Glucovance, Avandamet, Fortamet, Riomet, Metaglip, Glumetza, Actoplus met, Actoplus Met XR, Prandimet or Janumet), please observe the following instructions:   DO NOT  Take this medication for 48 hours after your exam.  Because you have normal renal function and have no comorbidities, you may restart your medication in 48 hours with no need for a renal function test or consultation with your physician.  You have normal renal function but have some comorbidities.  Comorbidities include liver disease, alcohol overuse, heart failure, myocardial or muscular ischemia, sepsis, or other severe infection.  Therefore you should consult your physician before restarting your medication.  You have impaired renal function.  You should consult your physician before restarting your medication and you are advised to get a renal function test before restarting your medication.  Please discuss this with your physician.   Call your doctor before you start taking this medication again.  Your doctor may want to check your kidney function before you start taking  this medication again.  I understand these instructions and have had an opportunity to discuss them with Radiology Department personnel.

## 2016-01-11 NOTE — ED Notes (Signed)
Patient now reporting pain to left middle finger, swelling and bruising present.

## 2016-01-11 NOTE — ED Notes (Signed)
PA at bedside.
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
## 2016-01-11 NOTE — ED Notes (Signed)
Radiology at bedside

## 2016-01-11 NOTE — ED Notes (Signed)
Pt dressed and vital signs updated. Pt awaiting discharge paperwork at bedside.
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
## 2016-01-16 ENCOUNTER — Ambulatory Visit (INDEPENDENT_AMBULATORY_CARE_PROVIDER_SITE_OTHER): Payer: Medicare Other | Admitting: Internal Medicine

## 2016-01-16 ENCOUNTER — Encounter: Payer: Self-pay | Admitting: Internal Medicine

## 2016-01-16 VITALS — BP 122/70 | HR 93 | Temp 98.8°F | Ht 63.0 in | Wt 180.0 lb

## 2016-01-16 DIAGNOSIS — J45901 Unspecified asthma with (acute) exacerbation: Secondary | ICD-10-CM | POA: Diagnosis not present

## 2016-01-16 MED ORDER — PREDNISONE 10 MG PO TABS: ORAL_TABLET | ORAL | Status: DC

## 2016-01-16 NOTE — Patient Instructions (Signed)
Asthma: Take prednisone as prescribed Continue the inhalers If not improving on prednisone of if the  problem resurface :  please contact Dr. Annamaria Boots  For pain: Continue taking Tylenol and Robaxin  Prednisone can increase your blood sugars, checked them daily, stop prednisone and call me if the sugars are more than 180.   I will see you later this month, you're already have an appointment
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
## 2016-01-16 NOTE — Progress Notes (Signed)
Pre visit review using our clinic review tool, if applicable. No additional management support is needed unless otherwise documented below in the visit note. 

## 2016-01-18 ENCOUNTER — Other Ambulatory Visit: Payer: Self-pay | Admitting: Internal Medicine

## 2016-01-18 NOTE — Telephone Encounter (Signed)
Ok as requested

## 2016-01-18 NOTE — Telephone Encounter (Signed)
CY Please advise if okay to refill. Thanks.  

## 2016-01-30 ENCOUNTER — Ambulatory Visit (INDEPENDENT_AMBULATORY_CARE_PROVIDER_SITE_OTHER): Payer: Medicare Other | Admitting: Internal Medicine

## 2016-01-30 ENCOUNTER — Encounter: Payer: Self-pay | Admitting: Internal Medicine

## 2016-01-30 VITALS — BP 120/74 | HR 77 | Temp 98.7°F | Ht 63.0 in | Wt 177.0 lb

## 2016-01-30 DIAGNOSIS — I10 Essential (primary) hypertension: Secondary | ICD-10-CM

## 2016-01-30 DIAGNOSIS — Z0000 Encounter for general adult medical examination without abnormal findings: Secondary | ICD-10-CM

## 2016-01-30 DIAGNOSIS — M810 Age-related osteoporosis without current pathological fracture: Secondary | ICD-10-CM

## 2016-01-30 DIAGNOSIS — E1140 Type 2 diabetes mellitus with diabetic neuropathy, unspecified: Secondary | ICD-10-CM | POA: Diagnosis not present

## 2016-01-30 DIAGNOSIS — C50912 Malignant neoplasm of unspecified site of left female breast: Secondary | ICD-10-CM

## 2016-01-30 MED ORDER — LETROZOLE 2.5 MG PO TABS: 2.5000 mg | ORAL_TABLET | Freq: Every day | ORAL | Status: DC
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
## 2016-01-30 NOTE — Assessment & Plan Note (Signed)
DM: Continue metformin, check A1c. Recommend a yearly eye check, foot exam today consistent with neuropathy, feet care discusses. HTN: well-controlled, continue Maxzide, not taking potassium supplement, recent BMP normal, will recheck a BMP Hyperlipidemia: Well-controlled per last FLP Hypercalcemia: Check an ionized- calcium Osteoporosis: (Osteopenia plus a  vertebral fracture): Recheck a bone density test around 05-2016, consider treatment noting that she has hypercalcemia (referral?) Mild anxiety depression: Counseled RTC 6 months

## 2016-01-30 NOTE — Progress Notes (Signed)
Pre visit review using our clinic review tool, if applicable. No additional management support is needed unless otherwise documented below in the visit note. 

## 2016-01-30 NOTE — Assessment & Plan Note (Addendum)
Td 2015; pneumonia shot 2008, prevnar 2015;  shingles shot 2009; had a   Flu shot    CCS: C scope 2006 (-) but incomplete, (-) ACBE; per GI notes :neg virtual Cscope 4-11, flex sig 01-2013 performed d/t  fecal impaction ; virtual colonoscopy (-) 11-2015, rec to see GI  (Dr Paulita Fujita) as needed   Sees gyn: Dr. Leo Grosser S/p B mastectomy, exam done at oncology and surgery

## 2016-01-30 NOTE — Patient Instructions (Addendum)
GO TO THE LAB : Get the blood work    GO TO THE FRONT DESK   Schedule a routine office visit or check up to be done in  4-5 months  Please be fasting   Front desk:   30      Schedule a visit to see one of our nurses for a Medicare wellness exam    Fall Prevention and Home Safety Falls cause injuries and can affect all age groups. It is possible to use preventive measures to significantly decrease the likelihood of falls. There are many simple measures which can make your home safer and prevent falls. OUTDOORS  Repair cracks and edges of walkways and driveways.  Remove high doorway thresholds.  Trim shrubbery on the main path into your home.  Have good outside lighting.  Clear walkways of tools, rocks, debris, and clutter.  Check that handrails are not broken and are securely fastened. Both sides of steps should have handrails.  Have leaves, snow, and ice cleared regularly.  Use sand or salt on walkways during winter months.  In the garage, clean up grease or oil spills. BATHROOM  Install night lights.  Install grab bars by the toilet and in the tub and shower.  Use non-skid mats or decals in the tub or shower.  Place a plastic non-slip stool in the shower to sit on, if needed.  Keep floors dry and clean up all water on the floor immediately.  Remove soap buildup in the tub or shower on a regular basis.  Secure bath mats with non-slip, double-sided rug tape.  Remove throw rugs and tripping hazards from the floors. BEDROOMS  Install night lights.  Make sure a bedside light is easy to reach.  Do not use oversized bedding.  Keep a telephone by your bedside.  Have a firm chair with side arms to use for getting dressed.  Remove throw rugs and tripping hazards from the floor. KITCHEN  Keep handles on pots and pans turned toward the center of the stove. Use back burners when possible.  Clean up spills quickly and allow time for drying.  Avoid walking  on wet floors.  Avoid hot utensils and knives.  Position shelves so they are not too high or low.  Place commonly used objects within easy reach.  If necessary, use a sturdy step stool with a grab bar when reaching.  Keep electrical cables out of the way.  Do not use floor polish or wax that makes floors slippery. If you must use wax, use non-skid floor wax.  Remove throw rugs and tripping hazards from the floor. STAIRWAYS  Never leave objects on stairs.  Place handrails on both sides of stairways and use them. Fix any loose handrails. Make sure handrails on both sides of the stairways are as long as the stairs.  Check carpeting to make sure it is firmly attached along stairs. Make repairs to worn or loose carpet promptly.  Avoid placing throw rugs at the top or bottom of stairways, or properly secure the rug with carpet tape to prevent slippage. Get rid of throw rugs, if possible.  Have an electrician put in a light switch at the top and bottom of the stairs. OTHER FALL PREVENTION TIPS  Wear low-heel or rubber-soled shoes that are supportive and fit well. Wear closed toe shoes.  When using a stepladder, make sure it is fully opened and both spreaders are firmly locked. Do not climb a closed stepladder.  Add color or contrast  paint or tape to grab bars and handrails in your home. Place contrasting color strips on first and last steps.  Learn and use mobility aids as needed. Install an electrical emergency response system.  Turn on lights to avoid dark areas. Replace light bulbs that burn out immediately. Get light switches that glow.  Arrange furniture to create clear pathways. Keep furniture in the same place.  Firmly attach carpet with non-skid or double-sided tape.  Eliminate uneven floor surfaces.  Select a carpet pattern that does not visually hide the edge of steps.  Be aware of all pets. OTHER HOME SAFETY TIPS  Set the water temperature for 120 F (48.8  C).  Keep emergency numbers on or near the telephone.  Keep smoke detectors on every level of the home and near sleeping areas. Document Released: 11/16/2002 Document Revised: 05/27/2012 Document Reviewed: 02/15/2012 [facility] Patient Information 2015 [place], [place]. This information is not intended to replace advice given to you by your health care provider. Make sure you discuss any questions you have with your health care provider.

## 2016-02-01 ENCOUNTER — Other Ambulatory Visit: Payer: Medicare Other

## 2016-02-01 LAB — BASIC METABOLIC PANEL
BUN: 15 mg/dL (ref 6–23)
CALCIUM: 9.9 mg/dL (ref 8.4–10.5)
CO2: 28 mEq/L (ref 19–32)
CREATININE: 0.69 mg/dL (ref 0.40–1.20)
Chloride: 103 mEq/L (ref 96–112)
GFR: 107.13 mL/min (ref >60.00)
GLUCOSE: 90 mg/dL (ref 70–99)
POTASSIUM: 4 meq/L (ref 3.5–5.1)
Sodium: 140 mEq/L (ref 135–145)

## 2016-02-01 LAB — HEMOGLOBIN A1C: HEMOGLOBIN A1C: 6.3 % (ref 4.6–6.5)

## 2016-02-02 LAB — CALCIUM, IONIZED: CALCIUM ION: 1.3 mmol/L (ref 1.12–1.32)

## 2016-02-17 ENCOUNTER — Ambulatory Visit: Payer: Medicare Other | Admitting: Internal Medicine

## 2016-02-22 ENCOUNTER — Telehealth: Payer: Self-pay | Admitting: Internal Medicine

## 2016-02-22 MED ORDER — DOXYCYCLINE HYCLATE 100 MG PO TABS: 100.0000 mg | ORAL_TABLET | Freq: Two times a day (BID) | ORAL | Status: DC

## 2016-02-22 NOTE — Telephone Encounter (Signed)
Offer doxycycline 100 mg, # 14, 1 twice daily 

## 2016-02-22 NOTE — Telephone Encounter (Signed)
Called and spoke with the pt. Reviewed recs and verified pharmacy as [facility] [address] Pt voiced understanding and had no further questions. Rx sent. Nothing further needed.

## 2016-02-22 NOTE — Telephone Encounter (Signed)
Spoke with pt, c/o worsening prod cough with yellow mucus, back pain, sob, worsening X2 weeks.  Pt has been taking cough syrup with codeine prescribed by CY for this.   Denies chest pain, fever.   [facility] uses [facility] [address].    CY please advise on recs. Thanks.  Allergies  Allergen Reactions  . Peanut-Containing Drug Products Swelling  . Penicillins Itching    outer rectal itching   Current Outpatient Prescriptions on File Prior to Visit  Medication Sig Dispense Refill  . aspirin EC 81 MG tablet Take 81 mg by mouth daily.    . beclomethasone (QVAR) 80 MCG/ACT inhaler Inhale 2 puffs into the lungs 2 (two) times daily. 3 Inhaler 3  . EPINEPHrine (EPIPEN 2-PAK) 0.3 mg/0.3 mL IJ SOAJ injection Inject 0.3 mLs (0.3 mg total) into the muscle once. (Patient not taking: Reported on 01/16/2016) 2 Device 1  . Fluticasone Furoate-Vilanterol (BREO ELLIPTA) 100-25 MCG/INH AEPB Inhale 1 puff then rinse mouth, once daily 60 each 11  . letrozole (FEMARA) 2.5 MG tablet Take 1 tablet (2.5 mg total) by mouth daily. 90 tablet 0  . levalbuterol (XOPENEX) 0.63 MG/3ML nebulizer solution Take 0.63 mg by nebulization every 4 (four) hours as needed for wheezing or shortness of breath. Reported on 01/11/2016    . loratadine (CLARITIN) 10 MG tablet Take 10 mg by mouth daily as needed for allergies.    . magnesium hydroxide (MILK OF MAGNESIA) 800 MG/5ML suspension Take 30 mLs by mouth at bedtime as needed for constipation.     . metFORMIN (GLUCOPHAGE) 1000 MG tablet Take 1 tablet (1,000 mg total) by mouth daily. 90 tablet 2  . methocarbamol (ROBAXIN) 500 MG tablet Take 1 tablet (500 mg total) by mouth 2 (two) times daily. (Patient not taking: Reported on 01/16/2016) 20 tablet 0  . Multiple Vitamin (MULTIVITAMIN WITH MINERALS) TABS tablet Take 1 tablet by mouth daily.    . potassium chloride SA (K-DUR,KLOR-CON) 20 MEQ tablet Take 20 mEq by mouth daily.    . promethazine-codeine (PHENERGAN WITH CODEINE) 6.25-10 MG/5ML  syrup TAKE 5 ML BY MOUTH EVERY 6 HOURS AS NEEDED FOR COUGH (Patient not taking: Reported on 01/30/2016) 200 mL 0  . simvastatin (ZOCOR) 40 MG tablet Take 1 tablet (40 mg total) by mouth at bedtime. 30 tablet 6  . triamterene-hydrochlorothiazide (MAXZIDE-25) 37.5-25 MG per tablet Take 1 tablet by mouth daily. 30 tablet 6  . VENTOLIN HFA 108 (90 BASE) MCG/ACT inhaler INHALE 2 PUFFS INTO THE LUNGS EVERY 4 HOURS AS NEEDED FOR WHEEZING OR SHORTNESS OF BREATH 18 Inhaler 4  . [DISCONTINUED] potassium chloride (K-DUR) 10 MEQ tablet Take 1 tablet (10 mEq total) by mouth daily. 30 tablet 0   No current facility-administered medications on file prior to visit.

## 2016-03-05 LAB — HM DIABETES EYE EXAM

## 2016-03-14 ENCOUNTER — Other Ambulatory Visit: Payer: Self-pay | Admitting: *Deleted

## 2016-03-14 DIAGNOSIS — C50912 Malignant neoplasm of unspecified site of left female breast: Secondary | ICD-10-CM

## 2016-03-15 ENCOUNTER — Telehealth: Payer: Self-pay | Admitting: Hematology

## 2016-03-15 ENCOUNTER — Other Ambulatory Visit (HOSPITAL_BASED_OUTPATIENT_CLINIC_OR_DEPARTMENT_OTHER): Payer: Medicare Other

## 2016-03-15 ENCOUNTER — Ambulatory Visit (HOSPITAL_BASED_OUTPATIENT_CLINIC_OR_DEPARTMENT_OTHER): Payer: Medicare Other | Admitting: Hematology

## 2016-03-15 ENCOUNTER — Encounter: Payer: Self-pay | Admitting: Hematology

## 2016-03-15 VITALS — BP 147/76 | HR 87 | Temp 98.6°F | Resp 18 | Ht 63.0 in | Wt 179.0 lb

## 2016-03-15 DIAGNOSIS — E119 Type 2 diabetes mellitus without complications: Secondary | ICD-10-CM | POA: Diagnosis not present

## 2016-03-15 DIAGNOSIS — I10 Essential (primary) hypertension: Secondary | ICD-10-CM

## 2016-03-15 DIAGNOSIS — Z853 Personal history of malignant neoplasm of breast: Secondary | ICD-10-CM

## 2016-03-15 DIAGNOSIS — C50912 Malignant neoplasm of unspecified site of left female breast: Secondary | ICD-10-CM

## 2016-03-15 DIAGNOSIS — M8580 Other specified disorders of bone density and structure, unspecified site: Secondary | ICD-10-CM | POA: Diagnosis not present

## 2016-03-15 LAB — COMPREHENSIVE METABOLIC PANEL
ALBUMIN: 4.3 g/dL (ref 3.5–5.0)
ALK PHOS: 86 U/L (ref 40–150)
ALT: 17 U/L (ref 0–55)
ANION GAP: 9 meq/L (ref 3–11)
AST: 19 U/L (ref 5–34)
BUN: 12.2 mg/dL (ref 7.0–26.0)
CALCIUM: 10.6 mg/dL — AB (ref 8.4–10.4)
CO2: 29 mEq/L (ref 22–29)
Chloride: 98 mEq/L (ref 98–109)
Creatinine: 0.9 mg/dL (ref 0.6–1.1)
EGFR: 70 mL/min/{1.73_m2} — AB (ref >90)
Glucose: 93 mg/dl (ref 70–140)
POTASSIUM: 3.3 meq/L — AB (ref 3.5–5.1)
Sodium: 136 mEq/L (ref 136–145)
Total Bilirubin: 0.73 mg/dL (ref 0.20–1.20)
Total Protein: 7.8 g/dL (ref 6.4–8.3)

## 2016-03-15 LAB — CBC WITH DIFFERENTIAL/PLATELET
BASO%: 0.7 % (ref 0.0–2.0)
BASOS ABS: 0.1 10*3/uL (ref 0.0–0.1)
EOS ABS: 0.3 10*3/uL (ref 0.0–0.5)
EOS%: 3.7 % (ref 0.0–7.0)
HEMATOCRIT: 46.3 % (ref 34.8–46.6)
HEMOGLOBIN: 15.2 g/dL (ref 11.6–15.9)
LYMPH#: 1.2 10*3/uL (ref 0.9–3.3)
LYMPH%: 13.4 % — ABNORMAL LOW (ref 14.0–49.7)
MCH: 29.7 pg (ref 25.1–34.0)
MCHC: 32.8 g/dL (ref 31.5–36.0)
MCV: 90.6 fL (ref 79.5–101.0)
MONO#: 0.6 10*3/uL (ref 0.1–0.9)
MONO%: 6.6 % (ref 0.0–14.0)
NEUT#: 7 10*3/uL — ABNORMAL HIGH (ref 1.5–6.5)
NEUT%: 75.6 % (ref 38.4–76.8)
PLATELETS: 324 10*3/uL (ref 145–400)
RBC: 5.11 10*6/uL (ref 3.70–5.45)
RDW: 14 % (ref 11.2–14.5)
WBC: 9.3 10*3/uL (ref 3.9–10.3)

## 2016-03-15 MED ORDER — LETROZOLE 2.5 MG PO TABS: 2.5000 mg | ORAL_TABLET | Freq: Every day | ORAL | Status: DC
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
## 2016-03-15 NOTE — Telephone Encounter (Signed)
Gave and printed appt sched and avs for pt for OCT...the patient sched on 6.19 @ 10am

## 2016-03-16 ENCOUNTER — Other Ambulatory Visit: Payer: Self-pay | Admitting: Hematology

## 2016-03-16 DIAGNOSIS — E876 Hypokalemia: Secondary | ICD-10-CM

## 2016-03-16 NOTE — Telephone Encounter (Signed)
Left message for pt to call Monday to discuss potassium refill.

## 2016-04-12 ENCOUNTER — Telehealth: Payer: Self-pay | Admitting: Internal Medicine

## 2016-04-12 MED ORDER — ONETOUCH LANCETS MISC: Status: AC

## 2016-04-12 NOTE — Telephone Encounter (Signed)
Caller name: Self  Can be reached: [phone]  Pharmacy:  [id number] - [place], [place] [phone] (Phone) [phone] (Fax)       Reason for call: need rx for One Touch Lancets because the one she has expired

## 2016-04-12 NOTE — Telephone Encounter (Signed)
Lancets sent in and patient informed

## 2016-04-16 ENCOUNTER — Other Ambulatory Visit: Payer: Self-pay

## 2016-04-16 MED ORDER — SIMVASTATIN 40 MG PO TABS: 40.0000 mg | ORAL_TABLET | Freq: Every day | ORAL | Status: DC

## 2016-04-18 ENCOUNTER — Encounter: Payer: Self-pay | Admitting: Internal Medicine

## 2016-04-23 ENCOUNTER — Other Ambulatory Visit: Payer: Self-pay | Admitting: Gastroenterology

## 2016-04-23 ENCOUNTER — Ambulatory Visit (HOSPITAL_COMMUNITY)
Admission: RE | Admit: 2016-04-23 | Discharge: 2016-04-23 | Disposition: A | Discharge status: Home / Self Care | Payer: Medicare Other | Source: Ambulatory Visit | Attending: Gastroenterology | Admitting: Gastroenterology

## 2016-04-23 ENCOUNTER — Encounter (HOSPITAL_COMMUNITY)
Admission: RE | Disposition: A | Discharge status: Home / Self Care | Payer: Self-pay | Source: Ambulatory Visit | Attending: Gastroenterology

## 2016-04-23 ENCOUNTER — Ambulatory Visit
Admission: RE | Admit: 2016-04-23 | Discharge: 2016-04-23 | Disposition: A | Discharge status: Home / Self Care | Payer: Medicare Other | Source: Ambulatory Visit | Attending: Gastroenterology | Admitting: Gastroenterology

## 2016-04-23 DIAGNOSIS — K5900 Constipation, unspecified: Secondary | ICD-10-CM | POA: Insufficient documentation

## 2016-04-23 HISTORY — PX: ANAL RECTAL MANOMETRY: SHX6358

## 2016-04-23 SURGERY — MANOMETRY, ANORECTAL

## 2016-04-23 NOTE — Progress Notes (Signed)
Anal Manometry done per protocol. Pt tolerated well without complaint or complication. Report to be sent to Dr. Erlinda Hong office.

## 2016-04-24 ENCOUNTER — Encounter (HOSPITAL_COMMUNITY): Payer: Self-pay | Admitting: Gastroenterology

## 2016-04-25 ENCOUNTER — Other Ambulatory Visit: Payer: Self-pay | Admitting: Gastroenterology

## 2016-04-25 ENCOUNTER — Ambulatory Visit
Admission: RE | Admit: 2016-04-25 | Discharge: 2016-04-25 | Disposition: A | Discharge status: Home / Self Care | Payer: Medicare Other | Source: Ambulatory Visit | Attending: Gastroenterology | Admitting: Gastroenterology

## 2016-04-25 DIAGNOSIS — K5909 Other constipation: Secondary | ICD-10-CM

## 2016-04-27 ENCOUNTER — Other Ambulatory Visit: Payer: Self-pay

## 2016-04-30 ENCOUNTER — Other Ambulatory Visit: Payer: Self-pay | Admitting: Internal Medicine

## 2016-05-17 ENCOUNTER — Other Ambulatory Visit: Payer: Self-pay | Admitting: Hematology

## 2016-05-17 ENCOUNTER — Encounter: Payer: Self-pay | Admitting: *Deleted

## 2016-05-17 ENCOUNTER — Other Ambulatory Visit: Payer: Self-pay | Admitting: Internal Medicine

## 2016-05-23 ENCOUNTER — Ambulatory Visit: Payer: Medicare Other | Admitting: Internal Medicine

## 2016-05-29 ENCOUNTER — Ambulatory Visit (INDEPENDENT_AMBULATORY_CARE_PROVIDER_SITE_OTHER): Payer: Medicare Other | Admitting: Internal Medicine

## 2016-05-29 ENCOUNTER — Encounter: Payer: Self-pay | Admitting: Internal Medicine

## 2016-05-29 VITALS — BP 126/70 | HR 84 | Ht 63.0 in | Wt 171.2 lb

## 2016-05-29 DIAGNOSIS — J4541 Moderate persistent asthma with (acute) exacerbation: Secondary | ICD-10-CM

## 2016-05-29 DIAGNOSIS — G4733 Obstructive sleep apnea (adult) (pediatric): Secondary | ICD-10-CM

## 2016-05-29 NOTE — Assessment & Plan Note (Signed)
This is probably her baseline. I think she can do better if she uses her LABA/ICS appropriately as a maintenance controller as explained.

## 2016-05-29 NOTE — Patient Instructions (Signed)
Use the Breo Ellipta maintenance inhaler daily    Inhale 1 puff then rinse mouth  Use the Ventolin albuterol rescue inhaler if needed     Inhale 2 puffs every 4-6 hours, only if needed  Please call if we can help  Discuss weight loss with Dr Larose Kells

## 2016-05-29 NOTE — Assessment & Plan Note (Signed)
She has felt current status is comfortable-discussed
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
## 2016-06-25 ENCOUNTER — Other Ambulatory Visit: Payer: Self-pay

## 2016-07-15 ENCOUNTER — Other Ambulatory Visit: Payer: Self-pay | Admitting: Internal Medicine

## 2016-07-18 ENCOUNTER — Telehealth: Payer: Self-pay | Admitting: Internal Medicine

## 2016-07-18 MED ORDER — ALBUTEROL SULFATE HFA 108 (90 BASE) MCG/ACT IN AERS: INHALATION_SPRAY | RESPIRATORY_TRACT | 5 refills | Status: DC

## 2016-07-18 NOTE — Telephone Encounter (Signed)
Pt last seen 6.20.17 by CY Called spoke with patient to verify the medication needed and the pharmacy Refills sent Nothing further needed; will sign off

## 2016-07-27 ENCOUNTER — Ambulatory Visit: Payer: Medicare Other | Admitting: *Deleted

## 2016-08-06 ENCOUNTER — Ambulatory Visit: Payer: Medicare Other | Admitting: *Deleted

## 2016-09-24 ENCOUNTER — Ambulatory Visit (HOSPITAL_BASED_OUTPATIENT_CLINIC_OR_DEPARTMENT_OTHER): Payer: Medicare Other | Admitting: Hematology

## 2016-09-24 ENCOUNTER — Encounter: Payer: Self-pay | Admitting: Hematology

## 2016-09-24 ENCOUNTER — Other Ambulatory Visit (HOSPITAL_BASED_OUTPATIENT_CLINIC_OR_DEPARTMENT_OTHER): Payer: Medicare Other

## 2016-09-24 ENCOUNTER — Other Ambulatory Visit: Payer: Self-pay | Admitting: Hematology

## 2016-09-24 ENCOUNTER — Telehealth: Payer: Self-pay | Admitting: Hematology

## 2016-09-24 VITALS — BP 138/81 | HR 86 | Temp 98.5°F | Resp 18 | Ht 63.0 in | Wt 162.0 lb

## 2016-09-24 DIAGNOSIS — I10 Essential (primary) hypertension: Secondary | ICD-10-CM

## 2016-09-24 DIAGNOSIS — M8580 Other specified disorders of bone density and structure, unspecified site: Secondary | ICD-10-CM

## 2016-09-24 DIAGNOSIS — C50912 Malignant neoplasm of unspecified site of left female breast: Secondary | ICD-10-CM

## 2016-09-24 DIAGNOSIS — M8588 Other specified disorders of bone density and structure, other site: Secondary | ICD-10-CM | POA: Diagnosis not present

## 2016-09-24 DIAGNOSIS — E119 Type 2 diabetes mellitus without complications: Secondary | ICD-10-CM

## 2016-09-24 DIAGNOSIS — Z23 Encounter for immunization: Secondary | ICD-10-CM | POA: Diagnosis not present

## 2016-09-24 LAB — CBC WITH DIFFERENTIAL/PLATELET
BASO%: 0.7 % (ref 0.0–2.0)
Basophils Absolute: 0.1 10*3/uL (ref 0.0–0.1)
EOS%: 3.5 % (ref 0.0–7.0)
Eosinophils Absolute: 0.4 10*3/uL (ref 0.0–0.5)
HEMATOCRIT: 44.8 % (ref 34.8–46.6)
HEMOGLOBIN: 15.1 g/dL (ref 11.6–15.9)
LYMPH#: 1.3 10*3/uL (ref 0.9–3.3)
LYMPH%: 12.1 % — ABNORMAL LOW (ref 14.0–49.7)
MCH: 30 pg (ref 25.1–34.0)
MCHC: 33.8 g/dL (ref 31.5–36.0)
MCV: 88.9 fL (ref 79.5–101.0)
MONO#: 0.8 10*3/uL (ref 0.1–0.9)
MONO%: 7 % (ref 0.0–14.0)
NEUT#: 8.5 10*3/uL — ABNORMAL HIGH (ref 1.5–6.5)
NEUT%: 76.7 % (ref 38.4–76.8)
Platelets: 369 10*3/uL (ref 145–400)
RBC: 5.03 10*6/uL (ref 3.70–5.45)
RDW: 13.8 % (ref 11.2–14.5)
WBC: 11.1 10*3/uL — ABNORMAL HIGH (ref 3.9–10.3)

## 2016-09-24 LAB — COMPREHENSIVE METABOLIC PANEL
ALT: 12 U/L (ref 0–55)
AST: 17 U/L (ref 5–34)
Albumin: 4.2 g/dL (ref 3.5–5.0)
Alkaline Phosphatase: 79 U/L (ref 40–150)
Anion Gap: 12 mEq/L — ABNORMAL HIGH (ref 3–11)
BUN: 12.8 mg/dL (ref 7.0–26.0)
CALCIUM: 10.9 mg/dL — AB (ref 8.4–10.4)
CHLORIDE: 95 meq/L — AB (ref 98–109)
CO2: 28 mEq/L (ref 22–29)
CREATININE: 0.8 mg/dL (ref 0.6–1.1)
EGFR: 79 mL/min/{1.73_m2} — ABNORMAL LOW (ref >90)
GLUCOSE: 98 mg/dL (ref 70–140)
Potassium: 3.6 mEq/L (ref 3.5–5.1)
SODIUM: 135 meq/L — AB (ref 136–145)
Total Bilirubin: 0.77 mg/dL (ref 0.20–1.20)
Total Protein: 7.8 g/dL (ref 6.4–8.3)

## 2016-09-24 MED ORDER — LETROZOLE 2.5 MG PO TABS: 2.5000 mg | ORAL_TABLET | Freq: Every day | ORAL | 1 refills | Status: DC

## 2016-09-24 MED ORDER — INFLUENZA VAC SPLIT QUAD 0.5 ML IM SUSY
0.5000 mL | PREFILLED_SYRINGE | Freq: Once | INTRAMUSCULAR | Status: AC
  Administered 2016-09-24: 0.5 mL via INTRAMUSCULAR
  Filled 2016-09-24: qty 0.5

## 2016-09-24 NOTE — Telephone Encounter (Signed)
GAVE PATIENT AVS REPORT AND APPOINTMENTS FOR April. °

## 2016-09-24 NOTE — Progress Notes (Signed)
[facility] Follow Up Visit  [name] [id number] [birth year] 75 y.o. 09/24/2016 11:05 AM   Principle Diagnosis:[name] 75 y.o. 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     Prior Therapy: 1. Patient underwent a screening mammogram and abnormalities were detected in her breasts. Biopsy was positive for invasive cancer. She underwent a bilateral mastectomy on 01/30/05 by Dr. Excell Seltzer. 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   2. Patient later had recurrence along the left mastectomy site of invasive ductal carcinoma, lymph nodes were negative and it was excised on 04/18/10 with negative margins, she underwent radiation therapy, followed by adjuvant Letrozole   Current therapy:  Letrozole 2.66m daily since 06/2010  Interim History: Joanne Nurse89y.o. female with h/o invasive ductal carcinoma who is here today for f/u. She is doing well overall. She has changed her diet, less fast food, more veggie lately, and she lost about 10 pounds in the past 4 months. She denies any other new symptoms. She is tolerating letrozole very well, no significant hot flashes or other side effects.  Past Medical History:  Diagnosis Date  . Allergic rhinitis   . Asthma    dx in adulthood ((75y/o) PFT 09/08/09 FEV1 1.70/81%; FEV1/FVC 0.75; +resp to dilartor; NI LV/DLCO  . [place]    hx of bilateral diagnosied in 2006, recurred 2011- [name] . Chronic constipation   . Colonic polyp   . [place]   . Diabetes mellitus   . Dislocation of right elbow 2016  . HTN (hypertension) 11-2011  . Hyperlipidemia   . OSA (obstructive sleep apnea)    CPAP intolerant  . Osteopenia   .  Peripheral neuropathy ([place]   . Urinary retention    During massive constipation (pelvic exam, suspect 8cm or more in diameter)   Medications:  Current Outpatient Prescriptions  Medication Sig Dispense Refill  . albuterol (VENTOLIN HFA) 108 (90 Base) MCG/ACT inhaler INHALE 2 PUFFS INTO THE LUNGS EVERY 4 HOURS AS NEEDED FOR WHEEZING OR SHORTNESS OF BREATH 18 g 5  . aspirin EC 81 MG tablet Take 81 mg by mouth daily.    .Marland KitchenEPINEPHrine (EPIPEN 2-PAK) 0.3 mg/0.3 mL IJ SOAJ injection Inject 0.3 mLs (0.3 mg total) into the muscle once. 2 Device 1  . Fluticasone Furoate-Vilanterol (BREO ELLIPTA) 100-25 MCG/INH AEPB Inhale 1 puff then rinse mouth, once daily 60 each 11  . glucose blood (ONE TOUCH ULTRA TEST) test strip Check blood sugars daily. 100 each 12  . letrozole (FEMARA) 2.5 MG tablet Take 1 tablet (2.5 mg total) by mouth daily. 90 tablet 1  . levalbuterol (XOPENEX) 0.63 MG/3ML nebulizer solution Take 0.63 mg by nebulization every 4 (four) hours as needed for wheezing or shortness of breath. Reported on 01/11/2016    . loratadine (CLARITIN) 10 MG tablet Take 10 mg by mouth daily as needed for allergies.    .Marland Kitchenlubiprostone (AMITIZA) 8 MCG capsule Take 1 capsule (8 mcg total) by mouth 2 (two) times daily with a meal.    . metFORMIN (GLUCOPHAGE) 1000 MG tablet Take 1 tablet (1,000 mg total) by mouth daily. 90 tablet 2  . methocarbamol (ROBAXIN) 500 MG tablet Take 1 tablet (500 mg total) by mouth 2 (  two) times daily. 20 tablet 0  . Multiple Vitamin (MULTIVITAMIN WITH MINERALS) TABS tablet Take 1 tablet by mouth daily.    . ONE TOUCH LANCETS MISC Use as directed once daily to check blood sugar.  DX E11.9 200 each 2  . Plecanatide (TRULANCE) 3 MG TABS Take 1 tablet by mouth daily.    . simvastatin (ZOCOR) 40 MG tablet Take 1 tablet (40 mg total) by mouth at bedtime. 90 tablet 1  . triamterene-hydrochlorothiazide (MAXZIDE-25) 37.5-25 MG tablet Take 1 tablet by mouth daily. 30 tablet 3   No current  facility-administered medications for this visit.      Allergies:  Allergies  Allergen Reactions  . Peanut-Containing Drug Products Swelling  . Penicillins Itching    outer rectal itching    Medical History: Past Medical History:  Diagnosis Date  . Allergic rhinitis   . Asthma    dx in adulthood (75 y/o) PFT 09/08/09 FEV1 1.70/81%; FEV1/FVC 0.75; +resp to dilartor; NI LV/DLCO  . Breast CA ([place])    hx of bilateral diagnosied in 2006, recurred 2011- [name]  . Chronic constipation   . Colonic polyp   . COPD (chronic obstructive pulmonary disease) ([place])   . Diabetes mellitus   . Dislocation of right elbow 2016  . HTN (hypertension) 11-2011  . Hyperlipidemia   . OSA (obstructive sleep apnea)    CPAP intolerant  . Osteopenia   . Peripheral neuropathy ([place])   . Urinary retention    During massive constipation (pelvic exam, suspect 8cm or more in diameter)    Surgical History:  Past Surgical History:  Procedure Laterality Date  . ANAL RECTAL MANOMETRY N/A 04/23/2016   Procedure: ANO RECTAL MANOMETRY;  Surgeon: [name], MD;  Location: [facility] ENDOSCOPY;  Service: Endoscopy;  Laterality: N/A;  . APPENDECTOMY    . ELBOW SURGERY Right 2016   DISLOCATION  . FLEXIBLE SIGMOIDOSCOPY N/A 01/31/2013   Procedure: FLEXIBLE SIGMOIDOSCOPY;  Surgeon: [name], MD;  Location: [facility] ENDOSCOPY;  Service: Endoscopy;  Laterality: N/A;  unprepped with sedation  . MASTECTOMY  2006   bilateral  . MASTECTOMY    . Pilonidal Cyst Surgery     ? of     Review of Systems: A 10 point review of systems was conducted and is otherwise negative except for what is noted above.    HEALTH MAINTENANCE:  Mammogram 05/06/12, s/p bialteral mastectomy Colonoscopy 12/10/04, flex sig on 01/2013  Bone Density 05/28/2016: Osteopenia, T score -1.5 at total left hip, slightly improved compared to 01/29/2014. Her estimated 10 year probability of major osteoporotic fracture 6.3% and hip fracture  0.7%   Physical Exam: Blood pressure 138/81, pulse 86, temperature 98.5 F (36.9 C), temperature source Oral, resp. rate 18, height '5\' 3"'  (1.6 m), weight 162 lb (73.5 kg), SpO2 100 %. GENERAL: Patient is a well appearing female in no acute distress HEENT:  Sclerae anicteric.  Oropharynx clear and moist. No ulcerations or evidence of oropharyngeal candidiasis. Neck is supple.  NODES:  No cervical, supraclavicular, or axillary lymphadenopathy palpated.  BREAST EXAM: s/p bilateral mastectomy, no nodularity on chest wall, no sign of recurrence.   LUNGS:  Clear to auscultation bilaterally.  No wheezes or rhonchi. HEART:  Regular rate and rhythm. No murmur appreciated. ABDOMEN:  Soft, nontender.  Positive, normoactive bowel sounds. No organomegaly palpated. MSK:  No focal spinal tenderness to palpation. She did have difficulty with range of motion in her left shoulder.  Full range of motion in  right upper extremity. EXTREMITIES:  No peripheral edema.   SKIN:  Clear with no obvious rashes or skin changes. No nail dyscrasia. NEURO:  Nonfocal. Well oriented.  Appropriate affect. ECOG PERFORMANCE STATUS: 1 - Symptomatic but completely ambulatory   Lab Results: CBC Latest Ref Rng & Units 09/24/2016 03/15/2016 01/11/2016  WBC 3.9 - 10.3 10e3/uL 11.1(H) 9.3 11.3(H)  Hemoglobin 11.6 - 15.9 g/dL 15.1 15.2 15.8(H)  Hematocrit 34.8 - 46.6 % 44.8 46.3 46.5(H)  Platelets 145 - 400 10e3/uL 369 324 311    CMP Latest Ref Rng & Units 09/24/2016 03/15/2016 02/01/2016  Glucose 70 - 140 mg/dl 98 93 90  BUN 7.0 - 26.0 mg/dL 12.8 12.2 15  Creatinine 0.6 - 1.1 mg/dL 0.8 0.9 0.69  Sodium 136 - 145 mEq/L 135(L) 136 140  Potassium 3.5 - 5.1 mEq/L 3.6 3.3(L) 4.0  Chloride 96 - 112 mEq/L - - 103  CO2 22 - 29 mEq/L '28 29 28  ' Calcium 8.4 - 10.4 mg/dL 10.9(H) 10.6(H) 9.9  Total Protein 6.4 - 8.3 g/dL 7.8 7.8 -  Total Bilirubin 0.20 - 1.20 mg/dL 0.77 0.73 -  Alkaline Phos 40 - 150 U/L 79 86 -  AST 5 - 34 U/L 17 19 -   ALT 0 - 55 U/L 12 17 -       Assessment and Plan: [name] 75 y.o. female with  1. ER/PR positive invasive mammary carcinoma in left breast in 2006 with local recurrence in 2011.   -She is status post bilateral mastectomy, on adjuvant letrozole -She is tolerating she is all very well, without significant side effects. -Giving her local recurrent disease, I'll extend her letrozole for longer period of time, likely 10 years. She agrees. She has been on it for 6 years now. -I refilled letrozole for her today. -Lab reviewed with her, CBC and CMP within normal limits, except hypercalcemia, she is not on calcium supplement. -She is clinically doing well, examined unremarkable, no concerns of cancer recurrence. -I encouraged her to continue healthy diet and be more physically active. Continue breast cancer surveillance with routine follow-up and lab.  2. Hypercalcemia -She will follow-up with her primary care physician -She knows to drink fluids adequately and avoid dehydration -Avoid calcium supplement -PTH was normal in 09/2014 -Stable, continue monitoring  3. Osteopenia -she is on multivitamin  -She is not qualified for biphosphonate or Prolia due to her risk of fracture   -Recent DEXA scan showed improved osteopenia  4. Hypertension, diabetes, COPD -She'll continue follow-up with primary care physician  The patient will return in 6 months for labs and evaluation.   She knows to call us in the interim for any questions or concerns.  We can certainly see her sooner if needed.  I spent 20 minutes counseling the patient face to face.  The total time spent in the appointment was 25 minutes.  Truitt Merle  09/24/2016 11:05 AM

## 2016-10-09 ENCOUNTER — Other Ambulatory Visit: Payer: Self-pay | Admitting: Internal Medicine

## 2016-10-09 NOTE — Telephone Encounter (Signed)
Per last refill patient was told to schedule an appointment before refilling. I have scheduled the patient for 10/10/16 at 115pm with Dr Larose Kells. I also am pending Rx refill until further evaluation. TL/CMA

## 2016-10-10 ENCOUNTER — Ambulatory Visit: Payer: Medicare Other | Admitting: Internal Medicine

## 2016-10-16 ENCOUNTER — Ambulatory Visit (INDEPENDENT_AMBULATORY_CARE_PROVIDER_SITE_OTHER): Payer: Medicare Other | Admitting: Internal Medicine

## 2016-10-16 ENCOUNTER — Encounter: Payer: Self-pay | Admitting: Internal Medicine

## 2016-10-16 ENCOUNTER — Ambulatory Visit: Payer: Medicare Other | Admitting: Internal Medicine

## 2016-10-16 VITALS — BP 122/74 | HR 87 | Temp 98.1°F | Resp 14 | Ht 63.0 in | Wt 161.2 lb

## 2016-10-16 DIAGNOSIS — C50912 Malignant neoplasm of unspecified site of left female breast: Secondary | ICD-10-CM

## 2016-10-16 DIAGNOSIS — E1142 Type 2 diabetes mellitus with diabetic polyneuropathy: Secondary | ICD-10-CM | POA: Diagnosis not present

## 2016-10-16 DIAGNOSIS — I10 Essential (primary) hypertension: Secondary | ICD-10-CM | POA: Diagnosis not present

## 2016-10-16 DIAGNOSIS — M810 Age-related osteoporosis without current pathological fracture: Secondary | ICD-10-CM

## 2016-10-16 DIAGNOSIS — M8580 Other specified disorders of bone density and structure, unspecified site: Secondary | ICD-10-CM

## 2016-10-16 MED ORDER — TRIAMTERENE-HCTZ 37.5-25 MG PO TABS: 1.0000 | ORAL_TABLET | Freq: Every day | ORAL | 1 refills | Status: DC

## 2016-10-16 MED ORDER — SIMVASTATIN 40 MG PO TABS: 40.0000 mg | ORAL_TABLET | Freq: Every day | ORAL | 1 refills | Status: DC

## 2016-10-16 MED ORDER — LETROZOLE 2.5 MG PO TABS: 2.5000 mg | ORAL_TABLET | Freq: Every day | ORAL | 1 refills | Status: DC

## 2016-10-16 MED ORDER — METFORMIN HCL 1000 MG PO TABS: 1000.0000 mg | ORAL_TABLET | Freq: Every day | ORAL | 1 refills | Status: DC

## 2016-10-16 NOTE — Assessment & Plan Note (Signed)
DM: Continue metformin, check up A1c HTN: Last BMP satisfactory. Continue Maxide. Osteoporosis: Order a bone density test Hypercalcemia: Check an ionized calcium, recent alkaline phosphatase normal. Breast cancer: Refill  Femara , per oncology note, Femara x  10 years until 2021. Weight loss: Again patient concerned about it, recommend observation for now. See previous entries. RTC , CPX,  6 months

## 2016-10-16 NOTE — Patient Instructions (Signed)
GO TO THE LAB : Get the blood work     GO TO THE FRONT DESK Schedule your next appointment for a   complete physical exam in 6 months

## 2016-10-16 NOTE — Progress Notes (Signed)
Pre visit review using our clinic review tool, if applicable. No additional management support is needed unless otherwise documented below in the visit note. 

## 2016-10-16 NOTE — Progress Notes (Signed)
Subjective:    Patient ID: [name], female    DOB: [birth year], 75 y.o.   MRN: [id number]  DOS:  10/16/2016 Type of visit - description : Routine visit Interval history: Diabetes: Good compliance of medication. No ambulatory CBGs HTN: Good med compliance, ambulatory BPs within normal Continue to be concerned records gradual weight loss, for the last 18 years. Also noted decreased muscle mass.  Wt Readings from Last 3 Encounters:  10/16/16 161 lb 4 oz (73.1 kg)  09/24/16 162 lb (73.5 kg)  05/29/16 171 lb 3.2 oz (77.7 kg)     Review of Systems  Denies fever chills or night sweats No nausea vomiting No cough No anxiety or depression, still has some trouble sleeping on and off. Cont w/  constipation, managed by GI  Past Medical History:  Diagnosis Date  . Allergic rhinitis   . Asthma    dx in adulthood (75 y/o) PFT 09/08/09 FEV1 1.70/81%; FEV1/FVC 0.75; +resp to dilartor; NI LV/DLCO  . Breast CA ([place])    hx of bilateral diagnosied in 2006, recurred 2011- [name]  . Chronic constipation   . Colonic polyp   . COPD (chronic obstructive pulmonary disease) ([place])   . Diabetes mellitus   . Dislocation of right elbow 2016  . HTN (hypertension) 11-2011  . Hyperlipidemia   . OSA (obstructive sleep apnea)    CPAP intolerant  . Osteopenia   . Peripheral neuropathy ([place])   . Urinary retention    During massive constipation (pelvic exam, suspect 8cm or more in diameter)    Past Surgical History:  Procedure Laterality Date  . ANAL RECTAL MANOMETRY N/A 04/23/2016   Procedure: ANO RECTAL MANOMETRY;  Surgeon: [name], MD;  Location: [facility] ENDOSCOPY;  Service: Endoscopy;  Laterality: N/A;  . APPENDECTOMY    . ELBOW SURGERY Right 2016   DISLOCATION  . FLEXIBLE SIGMOIDOSCOPY N/A 01/31/2013   Procedure: FLEXIBLE SIGMOIDOSCOPY;  Surgeon: [name], MD;  Location: [facility] ENDOSCOPY;  Service: Endoscopy;  Laterality: N/A;  unprepped with sedation  .  MASTECTOMY  2006   bilateral  . MASTECTOMY    . Pilonidal Cyst Surgery     ? of    Social History   Social History  . Marital status: Widowed    Spouse name: N/A  . Number of children: 2  . Years of education: N/A   Occupational History  . retired Retired   Social History Main Topics  . Smoking status: Former Smoker    Packs/day: 1.00    Years: 4.00    Types: Cigarettes    Quit date: 04/01/1968  . Smokeless tobacco: Never Used     Comment: Quit at age 75  . Alcohol use No  . Drug use: No  . Sexual activity: Not Currently   Other Topics Concern  . Not on file   Social History Narrative   Widowed last husband 4-10, lives alone, retired Pharmacist, hospital   2 children, 1 son lives w/ her   [place] twins          Medication List       Accurate as of 10/16/16  6:31 PM. Always use your most recent med list.          albuterol 108 (90 Base) MCG/ACT inhaler Commonly known as:  VENTOLIN HFA INHALE 2 PUFFS INTO THE LUNGS EVERY 4 HOURS AS NEEDED FOR WHEEZING OR SHORTNESS OF BREATH   AMITIZA 8 MCG capsule Generic drug:  lubiprostone Take 1  capsule (8 mcg total) by mouth 2 (two) times daily with a meal.   aspirin EC 81 MG tablet Take 81 mg by mouth daily.   EPINEPHrine 0.3 mg/0.3 mL Soaj injection Commonly known as:  EPIPEN 2-PAK Inject 0.3 mLs (0.3 mg total) into the muscle once.   fluticasone furoate-vilanterol 100-25 MCG/INH Aepb Commonly known as:  BREO ELLIPTA Inhale 1 puff then rinse mouth, once daily   glucose blood test strip Commonly known as:  ONE TOUCH ULTRA TEST Check blood sugars daily.   letrozole 2.5 MG tablet Commonly known as:  FEMARA Take 1 tablet (2.5 mg total) by mouth daily.   levalbuterol 0.63 MG/3ML nebulizer solution Commonly known as:  XOPENEX Take 0.63 mg by nebulization every 4 (four) hours as needed for wheezing or shortness of breath. Reported on 01/11/2016   loratadine 10 MG tablet Commonly known as:  CLARITIN Take 10 mg by mouth daily as  needed for allergies.   metFORMIN 1000 MG tablet Commonly known as:  GLUCOPHAGE Take 1 tablet (1,000 mg total) by mouth daily.   methocarbamol 500 MG tablet Commonly known as:  ROBAXIN Take 1 tablet (500 mg total) by mouth 2 (two) times daily.   multivitamin with minerals Tabs tablet Take 1 tablet by mouth daily.   ONE TOUCH LANCETS Misc Use as directed once daily to check blood sugar.  DX E11.9   simvastatin 40 MG tablet Commonly known as:  ZOCOR Take 1 tablet (40 mg total) by mouth at bedtime.   triamterene-hydrochlorothiazide 37.5-25 MG tablet Commonly known as:  MAXZIDE-25 Take 1 tablet by mouth daily.   TRULANCE 3 MG Tabs Generic drug:  Plecanatide Take 1 tablet by mouth daily.          Objective:   Physical Exam BP 122/74 (BP Location: Left Wrist, Patient Position: Sitting, Cuff Size: Small)   Pulse 87   Temp 98.1 F (36.7 C) (Oral)   Resp 14   Ht 5\' 3"  (1.6 m)   Wt 161 lb 4 oz (73.1 kg)   SpO2 98%   BMI 28.56 kg/m  General:   Well developed, well nourished . NAD.  HEENT:  Normocephalic . Face symmetric, atraumatic Lungs:  CTA B Normal respiratory effort, no intercostal retractions, no accessory muscle use. Heart: RRR,  no murmur.  No pretibial edema bilaterally  Skin: Not pale. Not jaundice Neurologic:  alert & oriented X3.  Speech normal, gait assisted by a cane, limited. Psych--  Cognition and judgment appear intact.  Cooperative with normal attention span and concentration.  Behavior appropriate. No anxious or depressed appearing.      Assessment & Plan:  Assessment >  DM w/ neuropathy HTN Hyperlipidemia Mild hypercalcemia, normal PTH 09-2014 Chronic, severe constipation:  s/p several colonoscopies, very limited d/p poor prep unable to finish exam . Virtual CT 2011 unrevealing . Usually good response to MOM, admitted 2014 d/t impaction, had a flex sig. Virtual cscope [id number] neg Acute urinary retention 08/22/2015 GB stones  Pulmonary  Dr Annamaria Boots --COPD, asthma --OSA CPAP intolerant --Nodules noted on CT-2017, stable. Osteoporosis  (osteopenia plus vertebral Fx per CT) --- 2011 normal DEXA; T score -1.3 05/2014   Oncology  Breast cancer bilateral 2006 --s/ p B mastectomy   --Recurrence L mastectomy site  2011, s/p excision >> XRT, Letrozole For the next 10 years, end 2021  PLAN: DM: Continue metformin, check up A1c HTN: Last BMP satisfactory. Continue Maxide. Osteoporosis: Order a bone density test Hypercalcemia: Check an ionized calcium, recent  alkaline phosphatase normal. Breast cancer: Refill  Femara , per oncology note, Femara x  10 years until 2021. Weight loss: Again patient concerned about it, recommend observation for now. See previous entries. RTC , CPX,  6 months

## 2016-12-13 ENCOUNTER — Ambulatory Visit: Payer: Medicare Other | Admitting: Internal Medicine

## 2016-12-20 ENCOUNTER — Encounter: Payer: Self-pay | Admitting: Internal Medicine

## 2016-12-20 ENCOUNTER — Ambulatory Visit (INDEPENDENT_AMBULATORY_CARE_PROVIDER_SITE_OTHER)
Admission: RE | Admit: 2016-12-20 | Discharge: 2016-12-20 | Disposition: A | Discharge status: Home / Self Care | Payer: Medicare Other | Source: Ambulatory Visit | Attending: Internal Medicine | Admitting: Internal Medicine

## 2016-12-20 ENCOUNTER — Ambulatory Visit (INDEPENDENT_AMBULATORY_CARE_PROVIDER_SITE_OTHER): Payer: Medicare Other | Admitting: Internal Medicine

## 2016-12-20 VITALS — BP 118/78 | HR 88 | Ht 63.0 in | Wt 156.8 lb

## 2016-12-20 DIAGNOSIS — J45901 Unspecified asthma with (acute) exacerbation: Secondary | ICD-10-CM

## 2016-12-20 DIAGNOSIS — J4541 Moderate persistent asthma with (acute) exacerbation: Secondary | ICD-10-CM

## 2016-12-20 DIAGNOSIS — G4733 Obstructive sleep apnea (adult) (pediatric): Secondary | ICD-10-CM

## 2016-12-20 DIAGNOSIS — K5909 Other constipation: Secondary | ICD-10-CM

## 2016-12-20 MED ORDER — LEVALBUTEROL HCL 0.63 MG/3ML IN NEBU
0.6300 mg | INHALATION_SOLUTION | Freq: Once | RESPIRATORY_TRACT | Status: AC
  Administered 2016-12-20: 0.63 mg via RESPIRATORY_TRACT

## 2016-12-20 MED ORDER — METHYLPREDNISOLONE ACETATE 80 MG/ML IJ SUSP
80.0000 mg | Freq: Once | INTRAMUSCULAR | Status: AC
  Administered 2016-12-20: 80 mg via INTRAMUSCULAR

## 2016-12-20 MED ORDER — ALBUTEROL SULFATE HFA 108 (90 BASE) MCG/ACT IN AERS: INHALATION_SPRAY | RESPIRATORY_TRACT | 12 refills | Status: DC

## 2016-12-20 MED ORDER — AZITHROMYCIN 250 MG PO TABS: ORAL_TABLET | ORAL | 0 refills | Status: DC
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
## 2016-12-20 NOTE — Patient Instructions (Signed)
Order- CXR  Dx exacerbation asthmatic bronchitis              Neb xop 0.63              Depo 80  Script sent refilling Ventolin rescue inhaler  Script sent for Zpak antibiotic  Ok to take Mucinex if needed  Please call if we can help

## 2016-12-24 ENCOUNTER — Other Ambulatory Visit: Payer: Self-pay | Admitting: Internal Medicine

## 2016-12-25 ENCOUNTER — Telehealth: Payer: Self-pay | Admitting: Internal Medicine

## 2016-12-25 NOTE — Telephone Encounter (Signed)
Pt returning call.[name] ° °

## 2016-12-25 NOTE — Telephone Encounter (Signed)
Results have been explained to patient, pt expressed understanding. Nothing further needed.  Notes Recorded by Deneise Lever, MD on 12/20/2016 at 1:40 PM EST CXR- No obvious new or active process. Incidentally note atherosclerosis.

## 2016-12-26 ENCOUNTER — Ambulatory Visit: Payer: Medicare Other | Admitting: Internal Medicine

## 2017-01-01 ENCOUNTER — Other Ambulatory Visit: Payer: Self-pay | Admitting: Internal Medicine

## 2017-01-04 ENCOUNTER — Ambulatory Visit (INDEPENDENT_AMBULATORY_CARE_PROVIDER_SITE_OTHER): Payer: Medicare Other | Admitting: Internal Medicine

## 2017-01-04 ENCOUNTER — Encounter: Payer: Self-pay | Admitting: Internal Medicine

## 2017-01-04 VITALS — BP 124/68 | HR 72 | Temp 97.7°F | Resp 12 | Ht 63.0 in | Wt 155.4 lb

## 2017-01-04 DIAGNOSIS — I700 Atherosclerosis of aorta: Secondary | ICD-10-CM | POA: Diagnosis not present

## 2017-01-04 DIAGNOSIS — E1142 Type 2 diabetes mellitus with diabetic polyneuropathy: Secondary | ICD-10-CM

## 2017-01-04 DIAGNOSIS — M810 Age-related osteoporosis without current pathological fracture: Secondary | ICD-10-CM | POA: Diagnosis not present

## 2017-01-04 MED ORDER — TRIAMTERENE-HCTZ 37.5-25 MG PO TABS: 1.0000 | ORAL_TABLET | Freq: Every day | ORAL | 1 refills | Status: DC

## 2017-01-04 NOTE — Patient Instructions (Addendum)
GO TO THE LAB : Get the blood work     See you in May for a physical exam

## 2017-01-04 NOTE — Progress Notes (Signed)
Subjective:    Patient ID: [name], female    DOB: 07-Apr-1941, 76 y.o.   MRN: [id number]  DOS:  01/04/2017 Type of visit - description : acute Interval history: Patient states that was told to come and discuss would be the findings of her chest x-ray (Aortic atherosclerosis). additionally  , chart is reviewed, she's due for A1c, bone density test and ionized calcium.   Review of Systems  In general feels well. Denies any chest pain or difficulty breathing No slurred speech, motor deficits, facial numbness. She does have occasional diplopia which is not a new sx  Past Medical History:  Diagnosis Date  . Allergic rhinitis   . Asthma    dx in adulthood (76 y/o) PFT 09/08/09 FEV1 1.70/81%; FEV1/FVC 0.75; +resp to dilartor; NI LV/DLCO  . Breast CA ([place])    hx of bilateral diagnosied in 2006, recurred 2011- [name]  . Chronic constipation   . Colonic polyp   . COPD (chronic obstructive pulmonary disease) ([place])   . Diabetes mellitus   . Dislocation of right elbow 2016  . HTN (hypertension) 11-2011  . Hyperlipidemia   . OSA (obstructive sleep apnea)    CPAP intolerant  . Osteopenia   . Peripheral neuropathy ([place])   . Urinary retention    During massive constipation (pelvic exam, suspect 8cm or more in diameter)    Past Surgical History:  Procedure Laterality Date  . ANAL RECTAL MANOMETRY N/A 04/23/2016   Procedure: ANO RECTAL MANOMETRY;  Surgeon: [name], MD;  Location: [facility];  Service: Endoscopy;  Laterality: N/A;  . APPENDECTOMY    . ELBOW SURGERY Right 2016   DISLOCATION  . FLEXIBLE SIGMOIDOSCOPY N/A 01/31/2013   Procedure: FLEXIBLE SIGMOIDOSCOPY;  Surgeon: [name], MD;  Location: [facility] ENDOSCOPY;  Service: Endoscopy;  Laterality: N/A;  unprepped with sedation  . MASTECTOMY  2006   bilateral  . MASTECTOMY    . Pilonidal Cyst Surgery     ? of    Social History   Social History  . Marital status: Widowed   Spouse name: N/A  . Number of children: 2  . Years of education: N/A   Occupational History  . retired Retired   Social History Main Topics  . Smoking status: Former Smoker    Packs/day: 1.00    Years: 4.00    Types: Cigarettes    Quit date: 04/01/1968  . Smokeless tobacco: Never Used     Comment: Quit at age 7  . Alcohol use No  . Drug use: No  . Sexual activity: Not Currently   Other Topics Concern  . Not on file   Social History Narrative   Widowed last husband 4-10, lives alone, retired Pharmacist, hospital   2 children, 1 son lives w/ her   [place] twins        Allergies as of 01/04/2017      Reactions   Peanut-containing Drug Products Swelling   Penicillins Itching   outer rectal itching      Medication List       Accurate as of 01/04/17 11:59 PM. Always use your most recent med list.          albuterol 108 (90 Base) MCG/ACT inhaler Commonly known as:  VENTOLIN HFA INHALE 2 PUFFS INTO THE LUNGS EVERY 4 HOURS AS NEEDED FOR WHEEZING OR SHORTNESS OF BREATH   aspirin EC 81 MG tablet Take 81 mg by mouth daily.   azithromycin 250 MG tablet  Commonly known as:  ZITHROMAX 2 today then one daily   EPINEPHrine 0.3 mg/0.3 mL Soaj injection Commonly known as:  EPIPEN 2-PAK Inject 0.3 mLs (0.3 mg total) into the muscle once.   fluticasone furoate-vilanterol 100-25 MCG/INH Aepb Commonly known as:  BREO ELLIPTA Inhale 1 puff then rinse mouth, once daily   glucose blood test strip Commonly known as:  ONE TOUCH ULTRA TEST Check blood sugars daily.   letrozole 2.5 MG tablet Commonly known as:  FEMARA Take 1 tablet (2.5 mg total) by mouth daily.   levalbuterol 0.63 MG/3ML nebulizer solution Commonly known as:  XOPENEX Take 0.63 mg by nebulization every 4 (four) hours as needed for wheezing or shortness of breath. Reported on 01/11/2016   loratadine 10 MG tablet Commonly known as:  CLARITIN Take 10 mg by mouth daily as needed for allergies.   metFORMIN 1000 MG  tablet Commonly known as:  GLUCOPHAGE Take 1 tablet (1,000 mg total) by mouth daily.   multivitamin with minerals Tabs tablet Take 1 tablet by mouth daily.   ONE TOUCH LANCETS Misc Use as directed once daily to check blood sugar.  DX E11.9   simvastatin 40 MG tablet Commonly known as:  ZOCOR Take 1 tablet (40 mg total) by mouth at bedtime.   triamterene-hydrochlorothiazide 37.5-25 MG tablet Commonly known as:  MAXZIDE-25 Take 1 tablet by mouth daily.          Objective:   Physical Exam BP 124/68 (BP Location: Left Wrist, Patient Position: Sitting, Cuff Size: Small)   Pulse 72   Temp 97.7 F (36.5 C) (Oral)   Resp 12   Ht 5\' 3"  (1.6 m)   Wt 155 lb 6 oz (70.5 kg)   SpO2 97%   BMI 27.52 kg/m  General:   Well developed, well nourished . NAD.  HEENT:  Normocephalic . Face symmetric, atraumatic Neck: Normal carotid pulses. No bruit. Lungs:  CTA B Normal respiratory effort, no intercostal retractions, no accessory muscle use. Heart: RRR,  no murmur.  No pretibial edema bilaterally  Skin: Not pale. Not jaundice Neurologic:  alert & oriented X3.  Speech normal, gait appropriate for age and unassisted Psych--  Cognition and judgment appear intact.  Cooperative with normal attention span and concentration.  Behavior appropriate. No anxious or depressed appearing.      Assessment & Plan:     Assessment   DM w/ neuropathy HTN Hyperlipidemia Mild hypercalcemia, normal PTH 09-2014 Chronic, severe constipation:  s/p several colonoscopies, very limited d/p poor prep unable to finish exam . Virtual CT 2011 unrevealing . Usually good response to MOM, admitted 2014 d/t impaction, had a flex sig. Virtual cscope [id number] neg Acute urinary retention 08/22/2015 GB stones  Pulmonary Dr Annamaria Boots --COPD, asthma --OSA CPAP intolerant --Nodules noted on CT-2017, stable. Osteoporosis  (osteopenia plus vertebral Fx per CT) --- 2011 normal DEXA; T score -1.3 05/2014   Oncology   Breast cancer bilateral 2006 --s/ p B mastectomy   --Recurrence L mastectomy site  2011, s/p excision >> XRT, Letrozole For the next 10 years, end 2021 Diplopia, gait disorder: Saw neurology 2015/ 2016. TSH, myasthenia panel were negative. MRI brain no acute   PLAN: Aortic atherosclerosis: Per chest x-ray, exam is benign, she has no sx. Plan is to control CV RF, rx  observation DM: due for A1c A DEXA was rx but not done, re order Hyperglycemia: Check a a1c Hypercalcemia: Check an ionized calcium  Follow-up in May 2018 for a CPX

## 2017-01-04 NOTE — Progress Notes (Signed)
Pre visit review using our clinic review tool, if applicable. No additional management support is needed unless otherwise documented below in the visit note. 

## 2017-01-05 LAB — CALCIUM, IONIZED: CALCIUM ION: 5.1 mg/dL (ref 4.8–5.6)

## 2017-01-05 LAB — HEMOGLOBIN A1C
HEMOGLOBIN A1C: 5.7 % — AB (ref <5.7)
MEAN PLASMA GLUCOSE: 117 mg/dL

## 2017-01-06 DIAGNOSIS — I700 Atherosclerosis of aorta: Secondary | ICD-10-CM | POA: Insufficient documentation

## 2017-01-06 NOTE — Assessment & Plan Note (Signed)
Aortic atherosclerosis: Per chest x-ray, exam is benign, she has no sx. Plan is to control CV RF, rx  observation DM: due for A1c A DEXA was rx but not done, re order Hyperglycemia: Check a a1c Hypercalcemia: Check an ionized calcium  Follow-up in May 2018 for a CPX

## 2017-01-11 ENCOUNTER — Other Ambulatory Visit: Payer: Self-pay | Admitting: *Deleted

## 2017-01-11 MED ORDER — FLUTICASONE FUROATE-VILANTEROL 100-25 MCG/INH IN AEPB: INHALATION_SPRAY | RESPIRATORY_TRACT | 11 refills | Status: DC

## 2017-01-17 NOTE — Assessment & Plan Note (Signed)
She has lost almost 20 pounds since her last sleep study and denies reports of snoring or recognize daytime sleepiness. She had not tolerated CPAP and considers this problem now insignificant.

## 2017-01-17 NOTE — Assessment & Plan Note (Addendum)
Exacerbation of bronchitis, probably viral with upper respiratory component contributing postnasal drainage. Plan-refill rescue inhaler, nebulizer treatments Xopenex, Depo-Medrol, CXR. Z-Pak after discussion, to hold.

## 2017-01-17 NOTE — Assessment & Plan Note (Signed)
GI problems associated with weight loss, being followed by gastroenterology

## 2017-01-26 LAB — HM DIABETES FOOT EXAM: HM Diabetic Foot Exam: NORMAL

## 2017-01-28 ENCOUNTER — Telehealth: Payer: Self-pay | Admitting: Internal Medicine

## 2017-01-28 MED ORDER — DOXYCYCLINE HYCLATE 100 MG PO TABS: ORAL_TABLET | ORAL | 0 refills | Status: DC

## 2017-01-28 NOTE — Telephone Encounter (Signed)
Spoke with pt. She is aware of CY's recommendations. Rx has been sent in. Nothing further was needed. 

## 2017-01-28 NOTE — Telephone Encounter (Signed)
Spoke with pt. States that she has possibly developed bronchitis after being on a cruise. Reports chest tightness and coughing. Cough is producing clear mucus. Denies wheezing, SOB, fever/chills/sweats. Symptoms started 2 days ago. Has not tried any OTC medications. Would like CY's recommendations.  CY - please advise. Thanks.

## 2017-01-28 NOTE — Telephone Encounter (Signed)
Offer doxycycline 100 mg, # 8, 2 today then one daily  Ok to take otc symptom meds like Tylenol Cold and Flu, if these help her

## 2017-01-31 ENCOUNTER — Telehealth: Payer: Self-pay | Admitting: *Deleted

## 2017-01-31 NOTE — Telephone Encounter (Signed)
Scheduled awv  02/14/17 @10 .

## 2017-02-12 NOTE — Progress Notes (Signed)
Pre visit review using our clinic review tool, if applicable. No additional management support is needed unless otherwise documented below in the visit note. 

## 2017-02-12 NOTE — Progress Notes (Addendum)
Subjective:   [name] is a 76 y.o. female who presents for Medicare Annual (Subsequent) preventive examination.  Review of Systems:  No ROS.  Medicare Wellness Visit. Cardiac Risk Factors include: advanced age (>22men, >73 women);diabetes mellitus;hypertension;sedentary lifestyle Sleep patterns: Wakes once per night to urinate. States she only gets 4-5 hrs per night and occasionally naps during the day. Pt states she has discussed with various physicians.   Home Safety/Smoke Alarms:  Feels safe in home. Smoke alarms in place.  Living environment; residence and Firearm Safety: Son with emotional problems lives with her. Gun safely stored. Seat Belt Safety/Bike Helmet: Wears seat belt.   Counseling:   Eye Exam- Reading glasses. Dr.Whitaker annually for DM eye exams. Dental- Dr.Coats annually.  Female:   Pap-Pt states no longer due to age.        Mammo- Mastectomy bilateral 2006 per pt.       Dexa scan- Last 05/25/14: No result on file. Ordered today.       CCS: Last 12/10/04: Normal per external report. Pt states this is managed by Dr.William Outlaw.     Objective:     Vitals: BP 134/73 (BP Location: Left Arm, Patient Position: Sitting, Cuff Size: Normal)   Pulse 66   Ht 5\' 3"  (1.6 m)   Wt 148 lb 9.6 oz (67.4 kg)   SpO2 100%   BMI 26.32 kg/m   Body mass index is 26.32 kg/m.   Tobacco History  Smoking Status  . Former Smoker  . Packs/day: 1.00  . Years: 4.00  . Types: Cigarettes  . Quit date: 04/01/1968  Smokeless Tobacco  . Never Used    Comment: Quit at age 38     Counseling given: No   Past Medical History:  Diagnosis Date  . Allergic rhinitis   . Asthma    dx in adulthood (76 y/o) PFT 09/08/09 FEV1 1.70/81%; FEV1/FVC 0.75; +resp to dilartor; NI LV/DLCO  . Breast CA ([place])    hx of bilateral diagnosied in 2006, recurred 2011- [name]  . Chronic constipation   . Colonic polyp   . COPD (chronic obstructive pulmonary disease) ([place])    . Diabetes mellitus   . Dislocation of right elbow 2016  . HTN (hypertension) 11-2011  . Hyperlipidemia   . OSA (obstructive sleep apnea)    CPAP intolerant  . Osteopenia   . Peripheral neuropathy ([place])   . Urinary retention    During massive constipation (pelvic exam, suspect 8cm or more in diameter)   Past Surgical History:  Procedure Laterality Date  . ANAL RECTAL MANOMETRY N/A 04/23/2016   Procedure: ANO RECTAL MANOMETRY;  Surgeon: [name], MD;  Location: [facility];  Service: Endoscopy;  Laterality: N/A;  . APPENDECTOMY    . ELBOW SURGERY Right 2016   DISLOCATION  . FLEXIBLE SIGMOIDOSCOPY N/A 01/31/2013   Procedure: FLEXIBLE SIGMOIDOSCOPY;  Surgeon: [name], MD;  Location: [facility] ENDOSCOPY;  Service: Endoscopy;  Laterality: N/A;  unprepped with sedation  . MASTECTOMY  2006   bilateral  . MASTECTOMY    . Pilonidal Cyst Surgery     ? of   Family History  Problem Relation Age of Onset  . COPD Mother     smoker  . Hypertension Mother   . Lung cancer Father     died age 61  . Breast cancer Sister     GM,sister  . Diabetes Brother   . Cancer Maternal Grandmother     breast   .  Colon cancer Neg Hx   . Heart attack Neg Hx     no h/o early diseae   History  Sexual Activity  . Sexual activity: Not Currently    Outpatient Encounter Prescriptions as of 02/14/2017  Medication Sig  . albuterol (VENTOLIN HFA) 108 (90 Base) MCG/ACT inhaler INHALE 2 PUFFS INTO THE LUNGS EVERY 4 HOURS AS NEEDED FOR WHEEZING OR SHORTNESS OF BREATH  . aspirin EC 81 MG tablet Take 81 mg by mouth daily.  . fluticasone furoate-vilanterol (BREO ELLIPTA) 100-25 MCG/INH AEPB Inhale 1 puff then rinse mouth, once daily  . glucose blood (ONE TOUCH ULTRA TEST) test strip Check blood sugars daily.  Marland Kitchen letrozole (FEMARA) 2.5 MG tablet Take 1 tablet (2.5 mg total) by mouth daily.  Marland Kitchen levalbuterol (XOPENEX) 0.63 MG/3ML nebulizer solution Take 0.63 mg by nebulization every 4 (four) hours as  needed for wheezing or shortness of breath. Reported on 01/11/2016  . loratadine (CLARITIN) 10 MG tablet Take 10 mg by mouth daily as needed for allergies.  . metFORMIN (GLUCOPHAGE) 1000 MG tablet Take 1 tablet (1,000 mg total) by mouth daily.  . Multiple Vitamin (MULTIVITAMIN WITH MINERALS) TABS tablet Take 1 tablet by mouth daily.  . ONE TOUCH LANCETS MISC Use as directed once daily to check blood sugar.  DX E11.9  . simvastatin (ZOCOR) 40 MG tablet Take 1 tablet (40 mg total) by mouth at bedtime.  . triamterene-hydrochlorothiazide (MAXZIDE-25) 37.5-25 MG tablet Take 1 tablet by mouth daily.  Marland Kitchen EPINEPHrine (EPIPEN 2-PAK) 0.3 mg/0.3 mL IJ SOAJ injection Inject 0.3 mLs (0.3 mg total) into the muscle once. (Patient not taking: Reported on 01/04/2017)  . [DISCONTINUED] azithromycin (ZITHROMAX) 250 MG tablet 2 today then one daily  . [DISCONTINUED] doxycycline (VIBRA-TABS) 100 MG tablet Take 2 tablets today, then one daily until gone   No facility-administered encounter medications on file as of 02/14/2017.     Activities of Daily Living In your present state of health, do you have any difficulty performing the following activities: 02/14/2017 10/16/2016  Hearing? N N  Vision? N N  Difficulty concentrating or making decisions? N N  Walking or climbing stairs? N Y  Dressing or bathing? N N  Doing errands, shopping? N N  Preparing Food and eating ? N -  Using the Toilet? N -  In the past six months, have you accidently leaked urine? N -  Do you have problems with loss of bowel control? N -  Managing your Medications? N -  Managing your Finances? N -  Housekeeping or managing your Housekeeping? N -  Some recent data might be hidden    Patient Care Team: Colon Branch, MD as PCP - General Deneise Lever, MD as Consulting Physician (Pulmonary Disease) Minette Headland, NP as Nurse Practitioner (Hematology and Oncology) Excell Seltzer, MD as Consulting Physician (General Surgery) Pieter Partridge,  DO as Consulting Physician (Neurology) Alexis Frock, MD as Consulting Physician (Urology) [name], MD as Consulting Physician (Gastroenterology) Marylynn Pearson, MD as Consulting Physician (Ophthalmology)    Assessment:    Physical assessment deferred to PCP.  Exercise Activities and Dietary recommendations Current Exercise Habits: The patient does not participate in regular exercise at present, Exercise limited by: orthopedic condition(s)   Diet (meal preparation, eat out, water intake, caffeinated beverages, dairy products, fruits and vegetables): in general, a "healthy" diet        Goals    . Build muscle in legs by walking with roilling walker.  Fall Risk Fall Risk  02/14/2017 10/16/2016 10/07/2015 10/07/2014 10/01/2013  Falls in the past year? No No No No Yes  Number falls in past yr: - - - - 2 or more   Depression Screen PHQ 2/9 Scores 02/14/2017 10/16/2016 10/07/2015 10/07/2014  PHQ - 2 Score 0 0 0 2     Cognitive Function MMSE - Mini Mental State Exam 02/14/2017  Orientation to time 5  Orientation to Place 5  Registration 3  Attention/ Calculation 5  Recall 3  Language- name 2 objects 2  Language- repeat 1  Language- follow 3 step command 3  Language- read & follow direction 1  Write a sentence 1  Copy design 0  Total score 29        Immunization History  Administered Date(s) Administered  . Influenza Split 10/16/2011, 08/22/2012  . Influenza Whole 10/08/2008, 01/20/2010, 08/04/2010  . Influenza,inj,Quad PF,36+ Mos 08/21/2013, 09/21/2014, 09/29/2015, 09/24/2016  . Pneumococcal Conjugate-13 10/07/2014  . Pneumococcal Polysaccharide-23 12/10/2001, 11/04/2007  . Td 12/10/2001  . Tdap 10/01/2013  . Zoster 10/08/2008   Screening Tests Health Maintenance  Topic Date Due  . URINE MICROALBUMIN  01/20/2011  . MAMMOGRAM  05/06/2014  . FOOT EXAM  01/29/2017  . OPHTHALMOLOGY EXAM  03/05/2017  . HEMOGLOBIN A1C  07/04/2017  . TETANUS/TDAP  10/02/2023  .  COLONOSCOPY  11/10/2025  . INFLUENZA VACCINE  Completed  . DEXA SCAN  Completed  . PNA vac Low Risk Adult  Completed      Plan:     Continue to eat heart healthy diet (full of fruits, vegetables, whole grains, lean protein, water--limit salt, fat, and sugar intake) and increase physical activity as tolerated.  Continue doing brain stimulating activities (puzzles, reading, adult coloring books, staying active) to keep memory sharp.   During the course of the visit the patient was educated and counseled about the following appropriate screening and preventive services:   Vaccines to include Pneumoccal, Influenza, Hepatitis B, Td, Zostavax, HCV  Cardiovascular Disease  Colorectal cancer screening  Bone density screening  Diabetes screening  Glaucoma screening  Mammography/PAP  Nutrition counseling   Patient Instructions (the written plan) was given to the patient.   Naaman Plummer [place], [place]  02/14/2017   Kathlene November, MD

## 2017-02-14 ENCOUNTER — Encounter: Payer: Self-pay | Admitting: *Deleted

## 2017-02-14 ENCOUNTER — Ambulatory Visit (INDEPENDENT_AMBULATORY_CARE_PROVIDER_SITE_OTHER): Payer: Medicare Other | Admitting: *Deleted

## 2017-02-14 VITALS — BP 134/73 | HR 66 | Ht 63.0 in | Wt 148.6 lb

## 2017-02-14 DIAGNOSIS — Z780 Asymptomatic menopausal state: Secondary | ICD-10-CM | POA: Diagnosis not present

## 2017-02-14 DIAGNOSIS — Z0000 Encounter for general adult medical examination without abnormal findings: Secondary | ICD-10-CM | POA: Diagnosis not present

## 2017-02-14 NOTE — Patient Instructions (Signed)
Continue to eat heart healthy diet (full of fruits, vegetables, whole grains, lean protein, water--limit salt, fat, and sugar intake) and increase physical activity as tolerated.  Continue doing brain stimulating activities (puzzles, reading, adult coloring books, staying active) to keep memory sharp.

## 2017-02-15 ENCOUNTER — Telehealth: Payer: Self-pay | Admitting: Internal Medicine

## 2017-02-15 MED ORDER — SIMVASTATIN 40 MG PO TABS: 40.0000 mg | ORAL_TABLET | Freq: Every day | ORAL | 1 refills | Status: DC

## 2017-02-15 NOTE — Telephone Encounter (Signed)
Self  Refill request for simvastatin   Pharmacy: [id number] - [place], [place]

## 2017-02-15 NOTE — Telephone Encounter (Signed)
Rx sent 

## 2017-02-19 MED ORDER — SIMVASTATIN 40 MG PO TABS: 40.0000 mg | ORAL_TABLET | Freq: Every day | ORAL | 1 refills | Status: DC

## 2017-02-19 NOTE — Telephone Encounter (Signed)
Patient spoke with pharmacy today and Rx was never received requesting nurse to call pharmacy directly, patient would like a call when nurse speaks with pharmacy.

## 2017-02-19 NOTE — Addendum Note (Signed)
Addended byDamita Dunnings D on: 02/19/2017 04:00 PM   Modules accepted: Orders

## 2017-02-19 NOTE — Telephone Encounter (Signed)
Rx resent.

## 2017-02-20 ENCOUNTER — Telehealth: Payer: Self-pay | Admitting: Internal Medicine

## 2017-02-20 NOTE — Telephone Encounter (Signed)
Caller name: Relationship to patient: Self Can be reached: [phone] Pharmacy:  Reason for call: Patient is requesting a letter from provider to get a walker. States the cane is no longer helping her.

## 2017-02-20 NOTE — Telephone Encounter (Signed)
Please send a letter [name] is a patient of mine, due to multiple comorbidities, she has a difficult time getting around, she would greatly benefit from a walker of her choice.

## 2017-02-20 NOTE — Telephone Encounter (Signed)
Please advise 

## 2017-02-21 NOTE — Telephone Encounter (Signed)
Letter printed, awaiting MD signature.

## 2017-02-21 NOTE — Telephone Encounter (Signed)
Spoke w/ Pt, informed letter is ready for pick up at front desk. Pt verbalized understanding, she will have her son Marden Noble pick up.

## 2017-02-26 LAB — HM DIABETES EYE EXAM

## 2017-03-25 ENCOUNTER — Other Ambulatory Visit: Payer: Medicare Other

## 2017-03-25 ENCOUNTER — Ambulatory Visit: Payer: Medicare Other | Admitting: Hematology

## 2017-03-29 ENCOUNTER — Telehealth: Payer: Self-pay | Admitting: Hematology

## 2017-03-29 NOTE — Telephone Encounter (Signed)
Left message with appt date and time . r/s per sch message from Dr. Burr Medico. Also sent reminder letter out.

## 2017-04-01 ENCOUNTER — Other Ambulatory Visit: Payer: Self-pay | Admitting: Physician Assistant

## 2017-04-01 DIAGNOSIS — R634 Abnormal weight loss: Secondary | ICD-10-CM

## 2017-04-01 DIAGNOSIS — Z86010 Personal history of colonic polyps: Secondary | ICD-10-CM

## 2017-04-01 DIAGNOSIS — R1903 Right lower quadrant abdominal swelling, mass and lump: Secondary | ICD-10-CM

## 2017-04-01 DIAGNOSIS — R1032 Left lower quadrant pain: Secondary | ICD-10-CM

## 2017-04-04 ENCOUNTER — Ambulatory Visit
Admission: RE | Admit: 2017-04-04 | Discharge: 2017-04-04 | Disposition: A | Discharge status: Home / Self Care | Payer: Medicare Other | Source: Ambulatory Visit | Attending: Physician Assistant | Admitting: Physician Assistant

## 2017-04-04 DIAGNOSIS — Z86010 Personal history of colonic polyps: Secondary | ICD-10-CM

## 2017-04-04 DIAGNOSIS — R1903 Right lower quadrant abdominal swelling, mass and lump: Secondary | ICD-10-CM

## 2017-04-04 DIAGNOSIS — R634 Abnormal weight loss: Secondary | ICD-10-CM

## 2017-04-04 DIAGNOSIS — R1032 Left lower quadrant pain: Secondary | ICD-10-CM

## 2017-04-04 MED ORDER — IOPAMIDOL (ISOVUE-300) INJECTION 61%
100.0000 mL | Freq: Once | INTRAVENOUS | Status: AC | PRN
  Administered 2017-04-04: 100 mL via INTRAVENOUS
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
## 2017-04-08 ENCOUNTER — Telehealth: Payer: Self-pay | Admitting: Hematology

## 2017-04-08 ENCOUNTER — Ambulatory Visit (HOSPITAL_BASED_OUTPATIENT_CLINIC_OR_DEPARTMENT_OTHER): Payer: Medicare Other | Admitting: Hematology

## 2017-04-08 ENCOUNTER — Other Ambulatory Visit (HOSPITAL_BASED_OUTPATIENT_CLINIC_OR_DEPARTMENT_OTHER): Payer: Medicare Other

## 2017-04-08 VITALS — BP 129/71 | HR 74 | Temp 98.2°F | Resp 18 | Ht 63.0 in | Wt 148.1 lb

## 2017-04-08 DIAGNOSIS — I10 Essential (primary) hypertension: Secondary | ICD-10-CM | POA: Diagnosis not present

## 2017-04-08 DIAGNOSIS — Z853 Personal history of malignant neoplasm of breast: Secondary | ICD-10-CM

## 2017-04-08 DIAGNOSIS — M8580 Other specified disorders of bone density and structure, unspecified site: Secondary | ICD-10-CM | POA: Diagnosis not present

## 2017-04-08 DIAGNOSIS — C50912 Malignant neoplasm of unspecified site of left female breast: Secondary | ICD-10-CM

## 2017-04-08 DIAGNOSIS — E1142 Type 2 diabetes mellitus with diabetic polyneuropathy: Secondary | ICD-10-CM | POA: Diagnosis not present

## 2017-04-08 LAB — CBC WITH DIFFERENTIAL/PLATELET
BASO%: 1.1 % (ref 0.0–2.0)
Basophils Absolute: 0.1 10*3/uL (ref 0.0–0.1)
EOS ABS: 0.3 10*3/uL (ref 0.0–0.5)
EOS%: 2.9 % (ref 0.0–7.0)
HEMATOCRIT: 42.3 % (ref 34.8–46.6)
HEMOGLOBIN: 14.2 g/dL (ref 11.6–15.9)
LYMPH#: 1.6 10*3/uL (ref 0.9–3.3)
LYMPH%: 17.5 % (ref 14.0–49.7)
MCH: 30.3 pg (ref 25.1–34.0)
MCHC: 33.5 g/dL (ref 31.5–36.0)
MCV: 90.6 fL (ref 79.5–101.0)
MONO#: 0.6 10*3/uL (ref 0.1–0.9)
MONO%: 6.7 % (ref 0.0–14.0)
NEUT%: 71.8 % (ref 38.4–76.8)
NEUTROS ABS: 6.7 10*3/uL — AB (ref 1.5–6.5)
Platelets: 388 10*3/uL (ref 145–400)
RBC: 4.67 10*6/uL (ref 3.70–5.45)
RDW: 14.6 % — AB (ref 11.2–14.5)
WBC: 9.3 10*3/uL (ref 3.9–10.3)

## 2017-04-08 LAB — COMPREHENSIVE METABOLIC PANEL
ALBUMIN: 4 g/dL (ref 3.5–5.0)
ALK PHOS: 63 U/L (ref 40–150)
ALT: 14 U/L (ref 0–55)
ANION GAP: 12 meq/L — AB (ref 3–11)
AST: 18 U/L (ref 5–34)
BUN: 16.9 mg/dL (ref 7.0–26.0)
CALCIUM: 10.2 mg/dL (ref 8.4–10.4)
CHLORIDE: 102 meq/L (ref 98–109)
CO2: 25 mEq/L (ref 22–29)
Creatinine: 0.8 mg/dL (ref 0.6–1.1)
EGFR: 88 mL/min/{1.73_m2} — ABNORMAL LOW (ref >90)
Glucose: 85 mg/dl (ref 70–140)
POTASSIUM: 3.8 meq/L (ref 3.5–5.1)
SODIUM: 139 meq/L (ref 136–145)
Total Bilirubin: 0.62 mg/dL (ref 0.20–1.20)
Total Protein: 7.2 g/dL (ref 6.4–8.3)

## 2017-04-08 MED ORDER — LETROZOLE 2.5 MG PO TABS: 2.5000 mg | ORAL_TABLET | Freq: Every day | ORAL | 1 refills | Status: DC

## 2017-04-08 NOTE — Telephone Encounter (Signed)
Per patient, her PCP  Dr Larose Kells ordered a DEXA  last month and that she has an appointment with him tomorrow. She will request her appointment date for the DEXA the his office is cutrrently working on and fwd the results to Dr Burr Medico.  Follow up appointments scheduled per 04/08/17 los.  Patient was given a copy of the AVS report and appointment schedule, per 04/08/17 los.

## 2017-04-09 ENCOUNTER — Ambulatory Visit (INDEPENDENT_AMBULATORY_CARE_PROVIDER_SITE_OTHER): Payer: Medicare Other | Admitting: Internal Medicine

## 2017-04-09 ENCOUNTER — Encounter: Payer: Self-pay | Admitting: Hematology

## 2017-04-09 ENCOUNTER — Encounter: Payer: Self-pay | Admitting: Internal Medicine

## 2017-04-09 VITALS — BP 124/72 | HR 78 | Temp 98.0°F | Resp 14 | Ht 63.0 in | Wt 147.2 lb

## 2017-04-09 DIAGNOSIS — R935 Abnormal findings on diagnostic imaging of other abdominal regions, including retroperitoneum: Secondary | ICD-10-CM | POA: Diagnosis not present

## 2017-04-09 DIAGNOSIS — E785 Hyperlipidemia, unspecified: Secondary | ICD-10-CM

## 2017-04-09 DIAGNOSIS — M810 Age-related osteoporosis without current pathological fracture: Secondary | ICD-10-CM | POA: Diagnosis not present

## 2017-04-09 DIAGNOSIS — R634 Abnormal weight loss: Secondary | ICD-10-CM | POA: Diagnosis not present

## 2017-04-09 DIAGNOSIS — E1142 Type 2 diabetes mellitus with diabetic polyneuropathy: Secondary | ICD-10-CM

## 2017-04-09 NOTE — Patient Instructions (Addendum)
GO TO THE LAB : Get the blood work        Will schedule a bone density test  Think about [facility]

## 2017-04-09 NOTE — Progress Notes (Signed)
Subjective:    Patient ID: [name], female    DOB: [birth year], 76 y.o.   MRN: [id number]  DOS:  04/09/2017 Type of visit - description : rov Interval history: No new concerns Continue with gradual weight loss. Note from oncology reviewed. Went to see GI a couple of weeks ago due to ongoing constipation, CT was done, see results.  Wt Readings from Last 3 Encounters:  04/09/17 147 lb 4 oz (66.8 kg)  04/08/17 148 lb 1.6 oz (67.2 kg)  02/14/17 148 lb 9.6 oz (67.4 kg)     Review of Systems  No fever chills No cough, hemoptysis. No lymphadenopathy on self-examination.  Past Medical History:  Diagnosis Date  . Allergic rhinitis   . Asthma    dx in adulthood (76 y/o) PFT 09/08/09 FEV1 1.70/81%; FEV1/FVC 0.75; +resp to dilartor; NI LV/DLCO  . Breast CA ([place])    hx of bilateral diagnosied in 2006, recurred 2011- [name]  . Chronic constipation   . Colonic polyp   . COPD (chronic obstructive pulmonary disease) ([place])   . Diabetes mellitus   . Dislocation of right elbow 2016  . HTN (hypertension) 11-2011  . Hyperlipidemia   . OSA (obstructive sleep apnea)    CPAP intolerant  . Osteopenia   . Peripheral neuropathy   . Urinary retention    During massive constipation (pelvic exam, suspect 8cm or more in diameter)    Past Surgical History:  Procedure Laterality Date  . ANAL RECTAL MANOMETRY N/A 04/23/2016   Procedure: ANO RECTAL MANOMETRY;  Surgeon: [name], MD;  Location: [facility] ENDOSCOPY;  Service: Endoscopy;  Laterality: N/A;  . APPENDECTOMY    . ELBOW SURGERY Right 2016   DISLOCATION  . FLEXIBLE SIGMOIDOSCOPY N/A 01/31/2013   Procedure: FLEXIBLE SIGMOIDOSCOPY;  Surgeon: [name], MD;  Location[facility] ENDOSCOPY;  Service: Endoscopy;  Laterality: N/A;  unprepped with sedation  . MASTECTOMY  2006   bilateral  . MASTECTOMY    . Pilonidal Cyst Surgery     ? of    Social History   Social History  . Marital status: Widowed   Spouse name: N/A  . Number of children: 2  . Years of education: N/A   Occupational History  . retired Retired   Social History Main Topics  . Smoking status: Former Smoker    Packs/day: 1.00    Years: 4.00    Types: Cigarettes    Quit date: 04/01/1968  . Smokeless tobacco: Never Used     Comment: Quit at age 60  . Alcohol use No  . Drug use: No  . Sexual activity: Not Currently   Other Topics Concern  . Not on file   Social History Narrative   Widowed last husband 4-10, lives alone, retired Pharmacist, hospital   2 children, 1 son lives w/ her   [place] twins        Allergies as of 04/09/2017      Reactions   Peanut-containing Drug Products Swelling   Penicillins Itching   outer rectal itching      Medication List       Accurate as of 04/09/17 11:59 PM. Always use your most recent med list.          albuterol 108 (90 Base) MCG/ACT inhaler Commonly known as:  VENTOLIN HFA INHALE 2 PUFFS INTO THE LUNGS EVERY 4 HOURS AS NEEDED FOR WHEEZING OR SHORTNESS OF BREATH   AMITIZA 24 MCG capsule Generic drug:  lubiprostone Take  1 capsule by mouth 2 (two) times daily with a meal.   aspirin EC 81 MG tablet Take 81 mg by mouth daily.   EPINEPHrine 0.3 mg/0.3 mL Soaj injection Commonly known as:  EPIPEN 2-PAK Inject 0.3 mLs (0.3 mg total) into the muscle once.   FLEET ENEMA RE Place 1 Units rectally as needed.   fluticasone furoate-vilanterol 100-25 MCG/INH Aepb Commonly known as:  BREO ELLIPTA Inhale 1 puff then rinse mouth, once daily   glucose blood test strip Commonly known as:  ONE TOUCH ULTRA TEST Check blood sugars daily.   letrozole 2.5 MG tablet Commonly known as:  FEMARA Take 1 tablet (2.5 mg total) by mouth daily.   levalbuterol 0.63 MG/3ML nebulizer solution Commonly known as:  XOPENEX Take 0.63 mg by nebulization every 4 (four) hours as needed for wheezing or shortness of breath. Reported on 01/11/2016   loratadine 10 MG tablet Commonly known as:  CLARITIN Take 10  mg by mouth daily as needed for allergies.   magnesium hydroxide 400 MG/5ML suspension Commonly known as:  MILK OF MAGNESIA Take 5 mLs by mouth at bedtime.   metFORMIN 1000 MG tablet Commonly known as:  GLUCOPHAGE Take 1 tablet (1,000 mg total) by mouth daily.   multivitamin with minerals Tabs tablet Take 1 tablet by mouth daily.   ONE TOUCH LANCETS Misc Use as directed once daily to check blood sugar.  DX E11.9   simvastatin 40 MG tablet Commonly known as:  ZOCOR Take 1 tablet (40 mg total) by mouth at bedtime.   triamterene-hydrochlorothiazide 37.5-25 MG tablet Commonly known as:  MAXZIDE-25 Take 1 tablet by mouth daily.          Objective:   Physical Exam BP 124/72 (BP Location: Left Wrist, Patient Position: Sitting, Cuff Size: Small)   Pulse 78   Temp 98 F (36.7 C) (Oral)   Resp 14   Ht 5\' 3"  (1.6 m)   Wt 147 lb 4 oz (66.8 kg)   SpO2 98%   BMI 26.08 kg/m  General:   Well developed, well nourished . NAD.  HEENT:  Normocephalic . Face symmetric, atraumatic Neck: Thyromegaly. Lungs:  CTA B Normal respiratory effort, no intercostal retractions, no accessory muscle use. Heart: RRR,  no murmur.  No pretibial edema bilaterally  Skin: Not pale. Not jaundice Neurologic:  alert & oriented X3.  Speech normal, gait appropriate for age and unassisted Psych--  Cognition and judgment appear intact.  Cooperative with normal attention span and concentration.  Behavior appropriate. No anxious or depressed appearing.     Assessment & Plan:    Assessment   DM w/ neuropathy HTN Hyperlipidemia Mild hypercalcemia, normal PTH 09-2014 GI: Chronic, severe constipation:  s/p several colonoscopies, very limited d/p poor prep unable to finish exam . Virtual CT 2011 unrevealing . Usually good response to MOM, admitted 2014 d/t impaction, had a flex sig. Virtual cscope 11-2015 neg GB stones  Pulmonary Dr Annamaria Boots --COPD, asthma --OSA CPAP intolerant --Nodules noted on  CT-2017, stable. Osteoporosis  (osteopenia plus vertebral Fx per CT), dexas at [facility] 2011 normal DEXA; T score 05/2012 -0.9 T score 05-2014 T score -1.37 Oncology  Breast cancer bilateral 2006 --s/ p B mastectomy   --Recurrence L mastectomy site  2011, s/p excision >> XRT, Letrozole For the next 10 years, end 2021 Diplopia, gait disorder: Saw neurology 2015/ 2016. TSH, myasthenia panel were negative. MRI brain no acute Acute urinary retention 08/22/2015  PLAN: DM: Well controlled on metformin, last A1c  satisfactory. No change. High cholesterol: On simvastatin, check a FLP today. Last LFT satisfactory Breast cancer: Seen by oncology yesterday, felt to be stable, on letrozole Weight loss: Ongoing, very gradual, see below. No particular symptoms, history of breast cancer in remission. Has chronic constipation, recent CT of the abdomen showed no malignancy. Chest x-ray 12-2016 without worrisome findings.    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 This may be amenable to percutaneous treatment . Unclear to me if this is clinically significant, will discuss with IR (addendum: Radiology recommend a formal consult, she probably needs further imaging and or a procedure. Given constipation and straining that area may bleed, will talk to the patient about it). Ongoing constipation: Recommend to continue discussing with GI Osteoporosis: schedule a dexa @ [facility], shingrex discussed RTC as scheduled for a physical in few days

## 2017-04-09 NOTE — Progress Notes (Signed)
Pre visit review using our clinic review tool, if applicable. No additional management support is needed unless otherwise documented below in the visit note. 

## 2017-04-12 ENCOUNTER — Ambulatory Visit
Admission: RE | Admit: 2017-04-12 | Discharge: 2017-04-12 | Disposition: A | Discharge status: Home / Self Care | Payer: Medicare Other | Source: Ambulatory Visit | Attending: Physician Assistant | Admitting: Physician Assistant

## 2017-04-12 ENCOUNTER — Encounter (HOSPITAL_COMMUNITY): Payer: Self-pay | Admitting: *Deleted

## 2017-04-12 ENCOUNTER — Emergency Department (HOSPITAL_COMMUNITY)
Admission: EM | Admit: 2017-04-12 | Discharge: 2017-04-13 | Disposition: A | Discharge status: Home / Self Care | Payer: Medicare Other | Attending: Emergency Medicine | Admitting: Emergency Medicine

## 2017-04-12 ENCOUNTER — Other Ambulatory Visit: Payer: Self-pay | Admitting: Physician Assistant

## 2017-04-12 DIAGNOSIS — Z853 Personal history of malignant neoplasm of breast: Secondary | ICD-10-CM | POA: Insufficient documentation

## 2017-04-12 DIAGNOSIS — J449 Chronic obstructive pulmonary disease, unspecified: Secondary | ICD-10-CM | POA: Diagnosis not present

## 2017-04-12 DIAGNOSIS — E1140 Type 2 diabetes mellitus with diabetic neuropathy, unspecified: Secondary | ICD-10-CM | POA: Diagnosis not present

## 2017-04-12 DIAGNOSIS — Z7982 Long term (current) use of aspirin: Secondary | ICD-10-CM | POA: Insufficient documentation

## 2017-04-12 DIAGNOSIS — Z87891 Personal history of nicotine dependence: Secondary | ICD-10-CM | POA: Diagnosis not present

## 2017-04-12 DIAGNOSIS — Z79899 Other long term (current) drug therapy: Secondary | ICD-10-CM | POA: Insufficient documentation

## 2017-04-12 DIAGNOSIS — K5900 Constipation, unspecified: Secondary | ICD-10-CM | POA: Insufficient documentation

## 2017-04-12 DIAGNOSIS — Z91010 Allergy to peanuts: Secondary | ICD-10-CM | POA: Insufficient documentation

## 2017-04-12 DIAGNOSIS — I10 Essential (primary) hypertension: Secondary | ICD-10-CM | POA: Insufficient documentation

## 2017-04-12 DIAGNOSIS — Z7984 Long term (current) use of oral hypoglycemic drugs: Secondary | ICD-10-CM | POA: Insufficient documentation

## 2017-04-12 DIAGNOSIS — R1032 Left lower quadrant pain: Secondary | ICD-10-CM | POA: Diagnosis present

## 2017-04-12 LAB — POC OCCULT BLOOD, ED: FECAL OCCULT BLD: NEGATIVE

## 2017-04-12 NOTE — ED Triage Notes (Signed)
To ED for further eval and treatment of 'extreme constipation'. Pt states she is under the care of GI for same. Given ememas and colonoscopy prep treatment, but states she had another ct scan and still has 'residue'. Pt was advised to come to the ED for further treatment. Denies vomiting or abd pain at present. States she is passing gas.

## 2017-04-12 NOTE — ED Provider Notes (Signed)
[facility] DEPT Provider Note   CSN: [id number] Arrival date & time: 04/12/17  1719 By signing my name below, I, Dyke Brackett, attest that this documentation has been prepared under the direction and in the presence of Tailynn Armetta, Annie Main, MD . Electronically Signed: Dyke Brackett, Scribe. 04/12/2017. 11:14 PM.   History   Chief Complaint Chief Complaint  Patient presents with  . Abdominal Pain   HPI [name] is a 76 y.o. female with a history of colonic polyps, chronic constipation, DM type 2,  who presents to the Emergency Department complaining of chronic, unchanging constipation. Pt had a CT scan on 04/04/17 and had an x-ray today which showed an unchanged amount of gas and stool. She was told by her GI to come to the ED for an enema. She states she has had uncontrolled bowel movements nightly since her colonoscopy. She reports associated intermittent LLQ pain which she states she has had for years. Pt also describes some moderate discomfort with bowel movements. No enema tried PTA. Per pt, she previously took Milk of Magnesia every night with minimal relief, but has not taken this since being placed on amitiza. Pt is not on insulin. No personal hx of cardiac disease or bowel obstruction. She has never needed a disimpaction. Pt denies any  nausea, vomiting, hematochezia, decreased appetite, chest pain, back pain, saddle anesthesia, numbness, or tingling.   The history is provided by the patient. No language interpreter was used.    Past Medical History:  Diagnosis Date  . Allergic rhinitis   . Asthma    dx in adulthood (76 y/o) PFT 09/08/09 FEV1 1.70/81%; FEV1/FVC 0.75; +resp to dilartor; NI LV/DLCO  . Breast CA ([place])    hx of bilateral diagnosied in 2006, recurred 2011- [name]  . Chronic constipation   . Colonic polyp   . COPD (chronic obstructive pulmonary disease) ([place])   . Diabetes mellitus   . Dislocation of right elbow 2016  . HTN (hypertension)  11-2011  . Hyperlipidemia   . OSA (obstructive sleep apnea)    CPAP intolerant  . Osteopenia   . Peripheral neuropathy   . Urinary retention    During massive constipation (pelvic exam, suspect 8cm or more in diameter)    Patient Active Problem List   Diagnosis Date Noted  . Aortic atherosclerosis ([place]) 01/06/2017  . Ileus ([place]) 10/02/2015  . PCP NOTES >>>>>>>>>>>>>>>>>>>>>>>>>>>>>> 09/03/2015  . Recurrent cancer of left breast ([place]) 03/16/2015  . Diplopia 10/07/2014  . Chronic insomnia 08/31/2012  . GERD (gastroesophageal reflux disease) 08/31/2012  . Hypertension 11/26/2011  . Annual physical exam 08/29/2011  . Hives/ angioedema 06/22/2011  . CONSTIPATION, CHRONIC 02/05/2011  . ALLERGIC REACTION, ACUTE 11/27/2010  . HERPES SIMPLEX INFECTION 10/17/2010  . SHOULDER, PAIN 04/07/2010  . LEG PAIN 06/09/2009  . ABDOMINAL PAIN, LEFT LOWER QUADRANT 06/09/2009  . Hyperlipidemia 11/04/2007  . DM II (diabetes mellitus, type II), controlled ([place]) 08/04/2007  . OBESITY 05/16/2007  . Obstructive sleep apnea 05/16/2007  . PERIPHERAL NEUROPATHY 05/16/2007  . Seasonal allergic rhinitis 05/16/2007  . Moderate persistent asthmatic bronchitis with acute exacerbation 05/16/2007  . Osteopenia 05/16/2007  . BREAST CANCER, HX OF 05/16/2007  . COLONIC POLYPS, HX OF 05/16/2007    Past Surgical History:  Procedure Laterality Date  . ANAL RECTAL MANOMETRY N/A 04/23/2016   Procedure: ANO RECTAL MANOMETRY;  Surgeon: [name], MD;  Location: [facility] ENDOSCOPY;  Service: Endoscopy;  Laterality: N/A;  . APPENDECTOMY    . ELBOW SURGERY Right  2016   DISLOCATION  . FLEXIBLE SIGMOIDOSCOPY N/A 01/31/2013   Procedure: FLEXIBLE SIGMOIDOSCOPY;  Surgeon: [name], MD;  Location: [facility] ENDOSCOPY;  Service: Endoscopy;  Laterality: N/A;  unprepped with sedation  . MASTECTOMY  2006   bilateral  . MASTECTOMY    . Pilonidal Cyst Surgery     ? of    OB History    No data available       Home  Medications    Prior to Admission medications   Medication Sig Start Date End Date Taking? Authorizing Provider  albuterol (VENTOLIN HFA) 108 (90 Base) MCG/ACT inhaler INHALE 2 PUFFS INTO THE LUNGS EVERY 4 HOURS AS NEEDED FOR WHEEZING OR SHORTNESS OF BREATH 12/20/16  Yes Young, Tarri Fuller D, MD  AMITIZA 24 MCG capsule Take 1 capsule by mouth 2 (two) times daily with a meal.  03/18/17  Yes [provider]  aspirin EC 81 MG tablet Take 81 mg by mouth daily.   Yes [provider]  EPINEPHrine (EPIPEN 2-PAK) 0.3 mg/0.3 mL IJ SOAJ injection Inject 0.3 mLs (0.3 mg total) into the muscle once. 04/11/15  Yes Paz, Alda Berthold, MD  fluticasone furoate-vilanterol (BREO ELLIPTA) 100-25 MCG/INH AEPB Inhale 1 puff then rinse mouth, once daily 01/11/17  Yes Young, Clinton D, MD  letrozole [facility]) 2.5 MG tablet Take 1 tablet (2.5 mg total) by mouth daily. 04/08/17  Yes Truitt Merle, MD  levalbuterol [facility]ne Lash) 0.63 MG/3ML nebulizer solution Take 0.63 mg by nebulization every 4 (four) hours as needed for wheezing or shortness of breath. Reported on 01/11/2016   Yes [provider]  loratadine (CLARITIN) 10 MG tablet Take 10 mg by mouth daily as needed for allergies.   Yes [provider]  metFORMIN (GLUCOPHAGE) 1000 MG tablet Take 1 tablet (1,000 mg total) by mouth daily. 10/16/16  Yes Paz, Alda Berthold, MD  Multiple Vitamin (MULTIVITAMIN WITH MINERALS) TABS tablet Take 1 tablet by mouth daily.   Yes [provider]  simvastatin (ZOCOR) 40 MG tablet Take 1 tablet (40 mg total) by mouth at bedtime. 02/19/17  Yes Paz, Alda Berthold, MD  triamterene-hydrochlorothiazide (MAXZIDE-25) 37.5-25 MG tablet Take 1 tablet by mouth daily. 01/04/17  Yes Paz, Jacqulyn Bath E, MD  glucose blood (ONE TOUCH ULTRA TEST) test strip Check blood sugars daily. 05/17/16   Colon Branch, MD  magnesium hydroxide (MILK OF MAGNESIA) 400 MG/5ML suspension Take 5 mLs by mouth at bedtime.    [provider]  ONE TOUCH LANCETS MISC Use as  directed once daily to check blood sugar.  DX E11.9 04/12/16   Colon Branch, MD    Family History Family History  Problem Relation Age of Onset  . COPD Mother     smoker  . Hypertension Mother   . Lung cancer Father     died age 52  . Breast cancer Sister     GM,sister  . Diabetes Brother   . Cancer Maternal Grandmother     breast   . Colon cancer Neg Hx   . Heart attack Neg Hx     no h/o early diseae    Social History Social History  Substance Use Topics  . Smoking status: Former Smoker    Packs/day: 1.00    Years: 4.00    Types: Cigarettes    Quit date: 04/01/1968  . Smokeless tobacco: Never Used     Comment: Quit at age 58  . Alcohol use No     Allergies  Peanut-containing drug products and Penicillins   Review of Systems Review of Systems All systems reviewed and are negative for acute change except as noted in the HPI.  Physical Exam Updated Vital Signs BP 125/73   Pulse 70   Temp 98.7 F (37.1 C) (Oral)   Resp 18   SpO2 97%   Physical Exam  Constitutional: She is oriented to person, place, and time. She appears well-developed and well-nourished. No distress.  HENT:  Head: Normocephalic and atraumatic.  Mouth/Throat: Oropharynx is clear and moist. No oropharyngeal exudate.  Eyes: Conjunctivae and EOM are normal. Pupils are equal, round, and reactive to light.  Neck: Normal range of motion. Neck supple.  No meningismus.  Cardiovascular: Normal rate, regular rhythm, normal heart sounds and intact distal pulses.   No murmur heard. Pulmonary/Chest: Effort normal and breath sounds normal. No respiratory distress.  Abdominal: Soft. She exhibits distension. There is no tenderness. There is no rebound and no guarding.  Genitourinary:  Genitourinary Comments: Chaperone (scribe) was present for exam which was performed with no discomfort or complications.  No fecal impaction. No gross blood.   Musculoskeletal: Normal range of motion. She exhibits no edema or  tenderness.  Neurological: She is alert and oriented to person, place, and time. No cranial nerve deficit. She exhibits normal muscle tone. Coordination normal.   5/5 strength throughout. CN 2-12 intact.Equal grip strength.   Skin: Skin is warm.  Psychiatric: She has a normal mood and affect. Her behavior is normal.  Nursing note and vitals reviewed.  ED Treatments / Results  DIAGNOSTIC STUDIES:  Oxygen Saturation is 95% on RA, adequate by my interpretation.    COORDINATION OF CARE:  11:13 PM Will order lipase, CBC, CMP, and POC occult blood. Discussed treatment plan with pt at bedside and pt agreed to plan.   Labs (all labs ordered are listed, but only abnormal results are displayed) Labs Reviewed  COMPREHENSIVE METABOLIC PANEL - Abnormal; Notable for the following:       Result Value   Sodium 134 (*)    Potassium 3.3 (*)    Total Protein 6.3 (*)    ALT 11 (*)    All other components within normal limits  CBC WITH DIFFERENTIAL/PLATELET  LIPASE, BLOOD  POC OCCULT BLOOD, ED    EKG  EKG Interpretation None       Radiology Ct Abdomen Pelvis W Contrast  Result Date: 04/13/2017 CLINICAL DATA:  Extreme constipation. EXAM: CT ABDOMEN AND PELVIS WITH CONTRAST TECHNIQUE: Multidetector CT imaging of the abdomen and pelvis was performed using the standard protocol following bolus administration of intravenous contrast. CONTRAST:  152mL ISOVUE-300 IOPAMIDOL (ISOVUE-300) INJECTION 61% COMPARISON:  04/04/2017, 04/12/2017 FINDINGS: Lower chest: No acute abnormality. Hepatobiliary: No focal liver abnormality is seen. At least 1 calculus is visible in the gallbladder lumen, 8 mm. No gallbladder mural thickening. No bile duct dilatation. Pancreas: Unremarkable. No pancreatic ductal dilatation or surrounding inflammatory changes. Spleen: Normal in size without focal abnormality. Adrenals/Urinary Tract: Adrenal glands are unremarkable. Kidneys are normal, without renal calculi, focal lesion, or  hydronephrosis. Bladder is unremarkable. Stomach/Bowel: Diffuse colonic distention with stool, air and liquid. Today, much of the stool is layering liquid. Stool balls are visible, but appear smaller compared to 04/04/2017. There is mural thickening of the colon, particularly in the rectum, but there is a reduction of mesorectal inflammation compared to the prior study. No perforation. Vascular/Lymphatic: The abdominal aorta is normal in caliber with slight atherosclerotic calcification. No adenopathy is evident in  the abdomen or pelvis. Reproductive: Uterus and bilateral adnexa are unremarkable. Other: No focal inflammation.  No ascites. Musculoskeletal: No significant skeletal lesion. IMPRESSION: 1. Persistent colonic distention with very large volume of stool and air. However, much of the stool is layering liquid today, with smaller stool balls and with reduced mesorectal inflammation compared to 04/04/2017. No perforation. No focal inflammation. 2. Cholelithiasis 3. Mild aortic atherosclerosis Electronically Signed   By: Andreas Newport M.D.   On: 04/13/2017 02:20   Dg Abd 2 Views  Result Date: 04/12/2017 CLINICAL DATA:  Left lower quadrant pain. EXAM: ABDOMEN - 2 VIEW COMPARISON:  04/04/2017 FINDINGS: Again noted is diffuse gaseous distention throughout the colon. A moderate amount of stool is again noted within the distal colon. The appearance is similar to 04/04/17. 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 Electronically Signed   By: Kerby Moors M.D.   On: 04/12/2017 16:07    Procedures Procedures (including critical care time)  Medications Ordered in ED Medications  magnesium citrate solution 1 Bottle (not administered)  sodium phosphate (FLEET) 7-19 GM/118ML enema 1 enema (not administered)  iopamidol (ISOVUE-300) 61 % injection (100 mLs  Contrast Given 04/13/17 0131)     Initial  Impression / Assessment and Plan / ED Course  I have reviewed the triage vital signs and the nursing notes.  Pertinent labs & imaging results that were available during my care of the patient were reviewed by me and considered in my medical decision making (see chart for details).  Clinical Course as of Apr 13 253  Sat Apr 13, 2017  0251 Pt states her pain and nausea have improved.   [EH]    Clinical Course User Index [EH] Dyke Brackett    Patient with acute on chronic constipation. Denies abdominal pain. Denies vomiting. Had x-ray today which still showed "residue". No fever.  Patient appears well. Abdomen is distended but soft and nontender. There is no fecal impaction.  X-ray from earlier today reviewed. CT scan obtained and shows no bowel obstruction but chronic dilation gaseous distention of bowel loops. CT scan appears improved since April 26.  Patient given by mouth magnesium citrate and enema in the ED. She will need a stronger bowel regimen at home. She's not been able to have a bowel movement in the ED but has a soft abdomen and no vomiting. Will be given enemas and further medications for constipation at home. Follow-up with her PCP and gastroenterology. Return precautions discussed.  Final Clinical Impressions(s) / ED Diagnoses   Final diagnoses:  Constipation, unspecified constipation type    New Prescriptions New Prescriptions   No medications on file   I personally performed the services described in this documentation, which was scribed in my presence. The recorded information has been reviewed and is accurate.    Ezequiel Essex, MD 04/14/17 [phone]

## 2017-04-13 ENCOUNTER — Emergency Department (HOSPITAL_COMMUNITY): Payer: Medicare Other

## 2017-04-13 ENCOUNTER — Encounter (HOSPITAL_COMMUNITY): Payer: Self-pay

## 2017-04-13 LAB — CBC WITH DIFFERENTIAL/PLATELET
BASOS ABS: 0.1 10*3/uL (ref 0.0–0.1)
Basophils Relative: 1 %
Eosinophils Absolute: 0.3 10*3/uL (ref 0.0–0.7)
Eosinophils Relative: 4 %
HEMATOCRIT: 40.8 % (ref 36.0–46.0)
Hemoglobin: 13.4 g/dL (ref 12.0–15.0)
LYMPHS PCT: 24 %
Lymphs Abs: 1.9 10*3/uL (ref 0.7–4.0)
MCH: 29.7 pg (ref 26.0–34.0)
MCHC: 32.8 g/dL (ref 30.0–36.0)
MCV: 90.5 fL (ref 78.0–100.0)
Monocytes Absolute: 0.7 10*3/uL (ref 0.1–1.0)
Monocytes Relative: 9 %
NEUTROS ABS: 4.8 10*3/uL (ref 1.7–7.7)
Neutrophils Relative %: 62 %
PLATELETS: 312 10*3/uL (ref 150–400)
RBC: 4.51 MIL/uL (ref 3.87–5.11)
RDW: 14 % (ref 11.5–15.5)
WBC: 7.8 10*3/uL (ref 4.0–10.5)

## 2017-04-13 LAB — COMPREHENSIVE METABOLIC PANEL
ALT: 11 U/L — AB (ref 14–54)
AST: 17 U/L (ref 15–41)
Albumin: 3.8 g/dL (ref 3.5–5.0)
Alkaline Phosphatase: 49 U/L (ref 38–126)
Anion gap: 10 (ref 5–15)
BILIRUBIN TOTAL: 0.9 mg/dL (ref 0.3–1.2)
BUN: 12 mg/dL (ref 6–20)
CHLORIDE: 101 mmol/L (ref 101–111)
CO2: 23 mmol/L (ref 22–32)
CREATININE: 0.81 mg/dL (ref 0.44–1.00)
Calcium: 9.8 mg/dL (ref 8.9–10.3)
GFR calc Af Amer: >60 mL/min (ref >60)
Glucose, Bld: 80 mg/dL (ref 65–99)
Potassium: 3.3 mmol/L — ABNORMAL LOW (ref 3.5–5.1)
Sodium: 134 mmol/L — ABNORMAL LOW (ref 135–145)
Total Protein: 6.3 g/dL — ABNORMAL LOW (ref 6.5–8.1)

## 2017-04-13 LAB — LIPASE, BLOOD: LIPASE: 29 U/L (ref 11–51)

## 2017-04-13 MED ORDER — ONDANSETRON HCL 4 MG/2ML IJ SOLN
4.0000 mg | Freq: Once | INTRAMUSCULAR | Status: AC
Start: 2017-04-13 — End: 2017-04-13
  Administered 2017-04-13: 4 mg via INTRAVENOUS
  Filled 2017-04-13: qty 2

## 2017-04-13 MED ORDER — IOPAMIDOL (ISOVUE-300) INJECTION 61%
INTRAVENOUS | Status: AC
  Administered 2017-04-13: 100 mL
  Filled 2017-04-13: qty 100

## 2017-04-13 MED ORDER — MAGNESIUM CITRATE PO SOLN
1.0000 | Freq: Once | ORAL | Status: AC
  Administered 2017-04-13: 1 via ORAL
  Filled 2017-04-13: qty 296

## 2017-04-13 MED ORDER — POLYETHYLENE GLYCOL 3350 17 G PO PACK: 17.0000 g | PACK | Freq: Every day | ORAL | 0 refills | Status: DC

## 2017-04-13 MED ORDER — DOCUSATE SODIUM 100 MG PO CAPS: 100.0000 mg | ORAL_CAPSULE | Freq: Two times a day (BID) | ORAL | 0 refills | Status: DC

## 2017-04-13 MED ORDER — FLEET ENEMA 7-19 GM/118ML RE ENEM
1.0000 | ENEMA | Freq: Once | RECTAL | Status: AC
  Administered 2017-04-13: 1 via RECTAL
  Filled 2017-04-13: qty 1

## 2017-04-13 NOTE — ED Notes (Signed)
Pt still has not had a bowl movement.

## 2017-04-13 NOTE — Discharge Instructions (Signed)
Take miralax twice daily for the next 3 days and then once daily. Follow up with Dr. Paulita Fujita. Return to the ED if you develop abdominal pain, vomiting, or any other concerns.

## 2017-04-19 ENCOUNTER — Telehealth: Payer: Self-pay | Admitting: Internal Medicine

## 2017-04-19 DIAGNOSIS — I729 Aneurysm of unspecified site: Secondary | ICD-10-CM

## 2017-04-19 NOTE — Telephone Encounter (Signed)
Has pseudoaneurysm of the pudendal artery, I discussed the situation with radiology, they rec a formal IR consult. I explained that to the patient and she is now agreement. Also, due for a DEXA. Please schedule : DEXA @ [facility] Consult: dx pseudoaneurysm pudendal artery

## 2017-04-19 NOTE — Assessment & Plan Note (Signed)
DM: Well controlled on metformin, last A1c satisfactory. No change. High cholesterol: On simvastatin, check a FLP today. Last LFT satisfactory Breast cancer: Seen by oncology yesterday, felt to be stable, on letrozole Weight loss: Ongoing, very gradual, see below. No particular symptoms, history of breast cancer in remission. Has chronic constipation, recent CT of the abdomen showed no malignancy. Chest x-ray 12-2016 without worrisome findings.    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 This may be amenable to percutaneous treatment . Unclear to me if this is clinically significant, will discuss with IR (addendum: Radiology recommend a formal consult, she probably needs further imaging and or a procedure. Given constipation and straining that area may bleed, will talk to the patient about it). Ongoing constipation: Recommend to continue discussing with GI Osteoporosis: schedule a dexa @ [facility], shingrex discussed RTC as scheduled for a physical in few days

## 2017-04-19 NOTE — Telephone Encounter (Signed)
IR referral placed, could not find ICD 10 code for pseudoaneurysm pudendal artery, placed for pseudoaneurysm(generic), okay per PCP . Bone density ordered 04/09/2017, message sent to [facility] to check status.

## 2017-04-22 ENCOUNTER — Other Ambulatory Visit: Payer: Self-pay | Admitting: Internal Medicine

## 2017-04-22 DIAGNOSIS — I729 Aneurysm of unspecified site: Secondary | ICD-10-CM

## 2017-04-23 ENCOUNTER — Ambulatory Visit
Admission: RE | Admit: 2017-04-23 | Discharge: 2017-04-23 | Disposition: A | Discharge status: Home / Self Care | Payer: Medicare Other | Source: Ambulatory Visit | Attending: Internal Medicine | Admitting: Internal Medicine

## 2017-04-23 DIAGNOSIS — I729 Aneurysm of unspecified site: Secondary | ICD-10-CM

## 2017-04-23 HISTORY — PX: IR RADIOLOGIST EVAL & MGMT: IMG5224

## 2017-04-23 NOTE — Consult Note (Signed)
Chief Complaint: Patient was seen in consultation today for  Chief Complaint  Patient presents with  . Advice Only    Consult for pseudoaneurysm of pudenal artery     at the request of Paz,Jose E  Referring Physician(s): Paz,Jose E  History of Present Illness: [name] is a 76 y.o. 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 The exact location is actually below the issue rectal fossa and just under the skin of the medial gluteal soft tissues. She had the scan at the end of April for constipation. She had an additional scan in early May in the emergency room. On the most recent scan, the pseudoaneurysm was not imaged. She is completely asymptomatic regarding the pseudoaneurysm. She does have a chronic problem with constipation and is somewhat aggressive during defecation. It is possible that she injured one of the pudendal artery branches remotely during defecation. Examination of remote CAT scans demonstrates that the abnormality was present and below 1 cm in size in 2011 and it has slowly grown in size. Noncontrast studies confirm that this is an avidly enhancing round structure consistent with a pseudoaneurysm.  Past Medical History:  Diagnosis Date  . Allergic rhinitis   . Asthma    dx in adulthood (76 y/o) PFT 09/08/09 FEV1 1.70/81%; FEV1/FVC 0.75; +resp to dilartor; NI LV/DLCO  . Breast CA ([place])    hx of bilateral diagnosied in 2006, recurred 2011- [name]  . Chronic constipation   . Colonic polyp   . COPD (chronic obstructive pulmonary disease) ([place])   . Diabetes mellitus   . Dislocation of right elbow 2016  . HTN (hypertension) 11-2011  . Hyperlipidemia   . OSA (obstructive sleep apnea)    CPAP intolerant  .  Osteopenia   . Peripheral neuropathy   . Urinary retention    During massive constipation (pelvic exam, suspect 8cm or more in diameter)    Past Surgical History:  Procedure Laterality Date  . ANAL RECTAL MANOMETRY N/A 04/23/2016   Procedure: ANO RECTAL MANOMETRY;  Surgeon: [name], MD;  Location: [facility] ENDOSCOPY;  Service: Endoscopy;  Laterality: N/A;  . APPENDECTOMY    . ELBOW SURGERY Right 2016   DISLOCATION  . FLEXIBLE SIGMOIDOSCOPY N/A 01/31/2013   Procedure: FLEXIBLE SIGMOIDOSCOPY;  Surgeon: [name], MD;  Location: [facility] ENDOSCOPY;  Service: Endoscopy;  Laterality: N/A;  unprepped with sedation  . MASTECTOMY  2006   bilateral  . MASTECTOMY    . Pilonidal Cyst Surgery     ? of    Allergies: Peanut-containing drug products and Penicillins  Medications: Prior to Admission medications   Medication Sig Start Date End Date Taking? Authorizing Provider  albuterol (VENTOLIN HFA) 108 (90 Base) MCG/ACT inhaler INHALE 2 PUFFS INTO THE LUNGS EVERY 4 HOURS AS NEEDED FOR WHEEZING OR SHORTNESS OF BREATH 12/20/16  Yes Young, Tarri Fuller D, MD  aspirin EC 81 MG tablet Take 81 mg by mouth daily.   Yes [provider]  EPINEPHrine (EPIPEN 2-PAK) 0.3 mg/0.3 mL IJ SOAJ injection Inject 0.3 mLs (0.3 mg total) into the muscle once. 04/11/15  Yes Paz, Alda Berthold, MD  fexofenadine (ALLEGRA) 30 MG tablet Take 30 mg by mouth 2 (two) times daily.   Yes [provider]  fluticasone furoate-vilanterol (BREO ELLIPTA) 100-25 MCG/INH AEPB Inhale 1 puff then rinse mouth, once daily 01/11/17  Yes Young, Clinton D, MD  glucose blood (ONE TOUCH ULTRA TEST) test strip Check blood sugars daily. 05/17/16  Yes Paz, Alda Berthold, MD  letrozole [facility]) 2.5 MG tablet Take 1 tablet (2.5 mg total) by mouth daily. 04/08/17  Yes Truitt Merle, MD  levalbuterol [facility]) 0.63 MG/3ML nebulizer solution Take 0.63 mg by nebulization every 4 (four) hours as needed for wheezing or shortness of breath. Reported on 01/11/2016    Yes [provider]  magnesium hydroxide (MILK OF MAGNESIA) 400 MG/5ML suspension Take 5 mLs by mouth at bedtime.   Yes [provider]  metFORMIN (GLUCOPHAGE) 1000 MG tablet Take 1 tablet (1,000 mg total) by mouth daily. 10/16/16  Yes Paz, Alda Berthold, MD  Multiple Vitamin (MULTIVITAMIN WITH MINERALS) TABS tablet Take 1 tablet by mouth daily.   Yes [provider]  ONE TOUCH LANCETS MISC Use as directed once daily to check blood sugar.  DX E11.9 04/12/16  Yes Paz, Alda Berthold, MD  polyethylene glycol [facility]) packet Take 17 g by mouth daily. 04/13/17  Yes Rancour, Annie Main, MD  simvastatin (ZOCOR) 40 MG tablet Take 1 tablet (40 mg total) by mouth at bedtime. 02/19/17  Yes Paz, Alda Berthold, MD  triamterene-hydrochlorothiazide (MAXZIDE-25) 37.5-25 MG tablet Take 1 tablet by mouth daily. 01/04/17  Yes Colon Branch, MD  AMITIZA 24 MCG capsule Take 1 capsule by mouth 2 (two) times daily with a meal.  03/18/17   [provider]  docusate sodium (COLACE) 100 MG capsule Take 1 capsule (100 mg total) by mouth every 12 (twelve) hours. Patient not taking: Reported on 04/23/2017 04/13/17   Ezequiel Essex, MD  loratadine (CLARITIN) 10 MG tablet Take 10 mg by mouth daily as needed for allergies.    [provider]     Family History  Problem Relation Age of Onset  . COPD Mother        smoker  . Hypertension Mother   . Lung cancer Father        died age 56  . Breast cancer Sister        GM,sister  . Diabetes Brother   . Cancer Maternal Grandmother        breast   . Colon cancer Neg Hx   . Heart attack Neg Hx        no h/o early diseae    Social History   Social History  . Marital status: Widowed    Spouse name: N/A  . Number of children: 2  . Years of education: N/A   Occupational History  . retired Retired   Social History Main Topics  . Smoking status: Former Smoker    Packs/day: 1.00    Years: 4.00    Types: Cigarettes    Quit date: 04/01/1968  . Smokeless  tobacco: Never Used     Comment: Quit at age 17  . Alcohol use No  . Drug use: No  . Sexual activity: Not Currently   Other Topics Concern  . Not on file   Social History Narrative   Widowed last husband 4-10, lives alone, retired Pharmacist, hospital   2 children, 1 son lives w/ her   [place] twins      ECOG Status: 0 - Asymptomatic  Review of Systems: A 12 point ROS discussed and pertinent positives are indicated in the HPI above.  All other systems are negative.  Review  of Systems  Vital Signs: BP 131/79   Pulse 78   Temp 99.2 F (37.3 C) (Oral)   Resp 14   Ht 5\' 4"  (1.626 m)   Wt 148 lb (67.1 kg)   SpO2 100%   BMI 25.40 kg/m   Physical Exam  Constitutional: She is oriented to person, place, and time. She appears well-developed and well-nourished.  Cardiovascular: Normal rate, regular rhythm and intact distal pulses.   Pulmonary/Chest: Effort normal and breath sounds normal.  Musculoskeletal: Normal range of motion.  Neurological: She is alert and oriented to person, place, and time.    Mallampati Score:     Imaging: Ct Abdomen Pelvis W Contrast  Result Date: 04/13/2017 CLINICAL DATA:  Extreme constipation. EXAM: CT ABDOMEN AND PELVIS WITH CONTRAST TECHNIQUE: Multidetector CT imaging of the abdomen and pelvis was performed using the standard protocol following bolus administration of intravenous contrast. CONTRAST:  123mL ISOVUE-300 IOPAMIDOL (ISOVUE-300) INJECTION 61% COMPARISON:  04/04/2017, 04/12/2017 FINDINGS: Lower chest: No acute abnormality. Hepatobiliary: No focal liver abnormality is seen. At least 1 calculus is visible in the gallbladder lumen, 8 mm. No gallbladder mural thickening. No bile duct dilatation. Pancreas: Unremarkable. No pancreatic ductal dilatation or surrounding inflammatory changes. Spleen: Normal in size without focal abnormality. Adrenals/Urinary Tract: Adrenal glands are unremarkable. Kidneys are normal, without renal calculi, focal lesion, or  hydronephrosis. Bladder is unremarkable. Stomach/Bowel: Diffuse colonic distention with stool, air and liquid. Today, much of the stool is layering liquid. Stool balls are visible, but appear smaller compared to 04/04/2017. There is mural thickening of the colon, particularly in the rectum, but there is a reduction of mesorectal inflammation compared to the prior study. No perforation. Vascular/Lymphatic: The abdominal aorta is normal in caliber with slight atherosclerotic calcification. No adenopathy is evident in the abdomen or pelvis. Reproductive: Uterus and bilateral adnexa are unremarkable. Other: No focal inflammation.  No ascites. Musculoskeletal: No significant skeletal lesion. IMPRESSION: 1. Persistent colonic distention with very large volume of stool and air. However, much of the stool is layering liquid today, with smaller stool balls and with reduced mesorectal inflammation compared to 04/04/2017. No perforation. No focal inflammation. 2. Cholelithiasis 3. Mild aortic atherosclerosis Electronically Signed   By: Andreas Newport M.D.   On: 04/13/2017 02:20   Ct Abdomen Pelvis W Contrast  Result Date: 04/04/2017 CLINICAL DATA:  Right lower quadrant mass. Left lower quadrant pain. EXAM: CT ABDOMEN AND PELVIS WITH CONTRAST TECHNIQUE: Multidetector CT imaging of the abdomen and pelvis was performed using the standard protocol following bolus administration of intravenous contrast. CONTRAST:  14mL ISOVUE-300 IOPAMIDOL (ISOVUE-300) INJECTION 61% COMPARISON:  Radiography 04/25/2016 FINDINGS: Lower chest: 2 tiny nodules in the right lower lobe which are stable from 2016 and considered benign. Trace pericardial fluid. Hepatobiliary: No focal liver abnormality.Cholelithiasis. No evidence of cholecystitis. Pancreas: Unremarkable. Spleen: Unremarkable. Adrenals/Urinary Tract: Negative adrenals. No hydronephrosis or stone. Unremarkable bladder. Stomach/Bowel: There is chronic distention of colon, today with gas  and stool. Discrete desiccated stool balls are seen at the level the rectum, with rectal wall thickening and mesorectal inflammation - changes of stercoral colitis. No perforation is noted. The small bowel is not dilated. No pericecal inflammation noted. Vascular/Lymphatic: Partly visualized 26 mm pseudoaneurysm in the right perineum, contiguous with prominent pudendal vessels cannot calcified based on prior noncontrast imaging. No mass or adenopathy. Reproductive:Reproductive organs are distorted by stool. No primary pathology noted. Other: No ascites or pneumoperitoneum. Musculoskeletal: Remote T12 compression fracture with moderate height loss. Multilevel degenerative changes. No acute  or aggressive finding. Remote avulsion fracture from the right anterior superior iliac spine. IMPRESSION: 1. Rectal impaction with stool and gas retention throughout the dilated colon, also seen on multiple prior studies. 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 2. 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 This may be amenable to percutaneous treatment and the patient could be evaluated in the IR Clinic. 3. Cholelithiasis. Electronically Signed   By: Monte Fantasia M.D.   On: 04/04/2017 11:24   Dg Abd 2 Views  Result Date: 04/12/2017 CLINICAL DATA:  Left lower quadrant pain. EXAM: ABDOMEN - 2 VIEW COMPARISON:  04/04/2017 FINDINGS: Again noted is diffuse gaseous distention throughout the colon. A moderate amount of stool is again noted within the distal colon. The appearance is similar to 04/04/17. 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 Electronically Signed   By: Kerby Moors M.D.   On: 04/12/2017 16:07    Labs:  CBC:  Recent Labs  09/24/16 1010 04/08/17 1515 04/13/17 0007  WBC 11.1* 9.3 7.8  HGB 15.1 14.2 13.4  HCT 44.8 42.3 40.8  PLT 369 388 312    COAGS: No results for input(s): INR,  APTT in the last 8760 hours.  BMP:  Recent Labs  09/24/16 1010 04/08/17 1515 04/13/17 0007  NA 135* 139 134*  K 3.6 3.8 3.3*  CL  --   --  101  CO2 28 25 23   GLUCOSE 98 85 80  BUN 12.8 16.9 12  CALCIUM 10.9* 10.2 9.8  CREATININE 0.8 0.8 0.81  GFRNONAA  --   --  >60  GFRAA  --   --  >60    LIVER FUNCTION TESTS:  Recent Labs  09/24/16 1010 04/08/17 1515 04/13/17 0007  BILITOT 0.77 0.62 0.9  AST 17 18 17   ALT 12 14 11*  ALKPHOS 79 63 49  PROT 7.8 7.2 6.3*  ALBUMIN 4.2 4.0 3.8    TUMOR MARKERS: No results for input(s): AFPTM, CEA, CA199, CHROMGRNA in the last 8760 hours.  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 It is fairly close to the overlying skin and is therefore superficial. It has been slowly growing over the last several years and I do recommend definitive treatment secondary to the risk of rupture. An embolization procedure would be fairly straightforward via the left groin and coils. Alternatively, it may be easier to perform a superficial incision and ligate the aneurysm. I'm recommending surgical consultation. If the surgeon feels embolization is easier and more straightforward, then I am happy to perform the procedure. I would not recommend thrombin injection given the risk of distal thrombus propagation. I discussed the procedure, risks, benefits, and alternatives to the patient and her daughter. They understand. Her questions were answered.  Thank you for this interesting consult.  I greatly enjoyed meeting [name] and look forward to participating in their care.  A copy of this report was sent to the requesting provider on this date.  Electronically Signed: Manan Olmo, ART A 04/23/2017, 3:59 PM   I spent a total of  40 Minutes   in face to face in  clinical consultation, greater than 50% of which was counseling/coordinating care for pseudoaneurysm embolization.

## 2017-04-25 ENCOUNTER — Encounter: Payer: Self-pay | Admitting: Internal Medicine

## 2017-04-25 ENCOUNTER — Ambulatory Visit (INDEPENDENT_AMBULATORY_CARE_PROVIDER_SITE_OTHER): Payer: Medicare Other | Admitting: Internal Medicine

## 2017-04-25 VITALS — BP 110/80 | HR 73 | Temp 97.9°F | Ht 63.0 in | Wt 148.2 lb

## 2017-04-25 DIAGNOSIS — E1142 Type 2 diabetes mellitus with diabetic polyneuropathy: Secondary | ICD-10-CM

## 2017-04-25 DIAGNOSIS — Z23 Encounter for immunization: Secondary | ICD-10-CM

## 2017-04-25 DIAGNOSIS — Z0000 Encounter for general adult medical examination without abnormal findings: Secondary | ICD-10-CM | POA: Diagnosis not present

## 2017-04-25 LAB — BASIC METABOLIC PANEL
BUN: 16 mg/dL (ref 6–23)
CHLORIDE: 99 meq/L (ref 96–112)
CO2: 26 meq/L (ref 19–32)
Calcium: 10.6 mg/dL — ABNORMAL HIGH (ref 8.4–10.5)
Creatinine, Ser: 0.71 mg/dL (ref 0.40–1.20)
GFR: 103.31 mL/min (ref >60.00)
Glucose, Bld: 84 mg/dL (ref 70–99)
POTASSIUM: 3.8 meq/L (ref 3.5–5.1)
Sodium: 136 mEq/L (ref 135–145)

## 2017-04-25 LAB — LIPID PANEL
CHOLESTEROL: 176 mg/dL (ref 0–200)
HDL: 60.6 mg/dL (ref >39.00)
LDL Cholesterol: 97 mg/dL (ref 0–99)
NONHDL: 115.35
TRIGLYCERIDES: 90 mg/dL (ref 0.0–149.0)
Total CHOL/HDL Ratio: 3
VLDL: 18 mg/dL (ref 0.0–40.0)

## 2017-04-25 LAB — HEMOGLOBIN A1C: Hgb A1c MFr Bld: 5.9 % (ref 4.6–6.5)

## 2017-04-25 LAB — HM DIABETES FOOT EXAM: HM Diabetic Foot Exam: ABNORMAL

## 2017-04-25 NOTE — Patient Instructions (Addendum)
GO TO THE LAB : Get the blood work     GO TO THE FRONT DESK Schedule your next appointment for a  checkup in 6 months   Come back in 2 months for your second [facility] shot    Diabetes and Foot Care Diabetes may cause you to have problems because of poor blood supply (circulation) to your feet and legs. This may cause the skin on your feet to become thinner, break easier, and heal more slowly. Your skin may become dry, and the skin may peel and crack. You may also have nerve damage in your legs and feet causing decreased feeling in them. You may not notice minor injuries to your feet that could lead to infections or more serious problems. Taking care of your feet is one of the most important things you can do for yourself. Follow these instructions at home:  Wear shoes at all times, even in the house. Do not go barefoot. Bare feet are easily injured.  Check your feet daily for blisters, cuts, and redness. If you cannot see the bottom of your feet, use a mirror or ask someone for help.  Wash your feet with warm water (do not use hot water) and mild soap. Then pat your feet and the areas between your toes until they are completely dry. Do not soak your feet as this can dry your skin.  Apply a moisturizing lotion or petroleum jelly (that does not contain alcohol and is unscented) to the skin on your feet and to dry, brittle toenails. Do not apply lotion between your toes.  Trim your toenails straight across. Do not dig under them or around the cuticle. File the edges of your nails with an emery board or nail file.  Do not cut corns or calluses or try to remove them with medicine.  Wear clean socks or stockings every day. Make sure they are not too tight. Do not wear knee-high stockings since they may decrease blood flow to your legs.  Wear shoes that fit properly and have enough cushioning. To break in new shoes, wear them for just a few hours a day. This prevents you from injuring your feet.  Always look in your shoes before you put them on to be sure there are no objects inside.  Do not cross your legs. This may decrease the blood flow to your feet.  If you find a minor scrape, cut, or break in the skin on your feet, keep it and the skin around it clean and dry. These areas may be cleansed with mild soap and water. Do not cleanse the area with peroxide, alcohol, or iodine.  When you remove an adhesive bandage, be sure not to damage the skin around it.  If you have a wound, look at it several times a day to make sure it is healing.  Do not use heating pads or hot water bottles. They may burn your skin. If you have lost feeling in your feet or legs, you may not know it is happening until it is too late.  Make sure your health care provider performs a complete foot exam at least annually or more often if you have foot problems. Report any cuts, sores, or bruises to your health care provider immediately. Contact a health care provider if:  You have an injury that is not healing.  You have cuts or breaks in the skin.  You have an ingrown nail.  You notice redness on your legs or feet.  You feel burning or tingling in your legs or feet.  You have pain or cramps in your legs and feet.  Your legs or feet are numb.  Your feet always feel cold. Get help right away if:  There is increasing redness, swelling, or pain in or around a wound.  There is a red line that goes up your leg.  Pus is coming from a wound.  You develop a fever or as directed by your health care provider.  You notice a bad smell coming from an ulcer or wound. This information is not intended to replace advice given to you by your health care provider. Make sure you discuss any questions you have with your health care provider. Document Released: 11/23/2000 Document Revised: 05/03/2016 Document Reviewed: 05/05/2013 Elsevier Interactive Patient Education  2017 [employer].

## 2017-04-25 NOTE — Assessment & Plan Note (Addendum)
--  Td 2015; pneumonia shot 2008, prevnar 2015;  shingles shot 2009 ; pt request shingrex: #1 dose today, RTC 1-2 months for #2    -- female care : sees gyn yearly   --S/p B mastectomy, exam done at oncology and surgery   --CCS: C scope 2006 (-) but incomplete, (-) ACBE; per GI notes :neg virtual Cscope 4-11, flex sig 01-2013 performed d/t  fecal impaction ; virtual colonoscopy (-) 11-2015, sees  GI  (Dr Paulita Fujita)    --Diet and exercise: Loosing weight, see last OV, rec protein rich diet and stay active, her goal is to keep her muscle mass. Unable to exercise much

## 2017-04-25 NOTE — Progress Notes (Signed)
Subjective:    Patient ID: [name], female    DOB: 05-Jan-1941, 76 y.o.   MRN: [id number]  DOS:  04/25/2017 Type of visit - description : cpx Interval history: No new concerns  Review of Systems  Occasionally feels hot or cold but denies actual fevers or night sweats. Ongoing left abdominal discomfort. Nocturia, 2 or 3 times at night sometimes. No problems during the daytime. Denies gross hematuria, dysuria. Request a shingrex   Other than above, a 14 point review of systems is negative    Past Medical History:  Diagnosis Date  . Allergic rhinitis   . Asthma    dx in adulthood (76 y/o) PFT 09/08/09 FEV1 1.70/81%; FEV1/FVC 0.75; +resp to dilartor; NI LV/DLCO  . Breast CA ([place])    hx of bilateral diagnosied in 2006, recurred 2011- [name]  . Chronic constipation   . Colonic polyp   . COPD (chronic obstructive pulmonary disease) ([place])   . Diabetes mellitus   . Dislocation of right elbow 2016  . HTN (hypertension) 11-2011  . Hyperlipidemia   . OSA (obstructive sleep apnea)    CPAP intolerant  . Osteopenia   . Peripheral neuropathy   . 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 Urinary retention    During massive constipation (pelvic exam, suspect 8cm or more in diameter)    Past Surgical History:  Procedure Laterality Date  . ANAL RECTAL MANOMETRY N/A 04/23/2016   Procedure: ANO RECTAL MANOMETRY;  Surgeon: [name], MD;  Location: [facility] ENDOSCOPY;  Service: Endoscopy;  Laterality: N/A;  . APPENDECTOMY     08/2004  . cyct on spine      1967  . ELBOW SURGERY Right 2016   DISLOCATION  . EXPLORATORY LAPAROTOMY    . FLEXIBLE SIGMOIDOSCOPY N/A 01/31/2013   Procedure: FLEXIBLE SIGMOIDOSCOPY;  Surgeon: [name], MD;  Location: [facility] ENDOSCOPY;  Service: Endoscopy;  Laterality: N/A;  unprepped with sedation  . lypoma removed from back-benign 2005     2005  . MASTECTOMY  2006   bilateral  . MASTECTOMY    . Pilonidal Cyst  Surgery     ? of  . removal of boils     1970/1980    Social History   Social History  . Marital status: Widowed    Spouse name: N/A  . Number of children: 2  . Years of education: N/A   Occupational History  . retired Retired   Social History Main Topics  . Smoking status: Former Smoker    Packs/day: 1.00    Years: 4.00    Types: Cigarettes    Quit date: 04/01/1968  . Smokeless tobacco: Former Systems developer    Quit date: 12/11/1967     Comment: Quit at age 35  . Alcohol use No  . Drug use: No  . Sexual activity: Not Currently   Other Topics Concern  . Not on file   Social History Narrative   Widowed last husband 4-10, lives alone, retired Pharmacist, hospital   2 children    [place] twins       Family History  Problem Relation Age of Onset  . COPD Mother        smoker  . Hypertension Mother   . Lung cancer Father        died age 86  . Breast cancer Sister        GM,sister  . Diabetes Brother   . Cancer Maternal Grandmother  breast   . Colon cancer Neg Hx   . Heart attack Neg Hx        no h/o early diseae     Allergies as of 04/25/2017      Reactions   Peanut-containing Drug Products Swelling   Penicillins Itching   outer rectal itching      Medication List       Accurate as of 04/25/17 11:59 PM. Always use your most recent med list.          albuterol 108 (90 Base) MCG/ACT inhaler Commonly known as:  VENTOLIN HFA INHALE 2 PUFFS INTO THE LUNGS EVERY 4 HOURS AS NEEDED FOR WHEEZING OR SHORTNESS OF BREATH   AMITIZA 24 MCG capsule Generic drug:  lubiprostone Take 1 capsule by mouth 2 (two) times daily with a meal.   aspirin EC 81 MG tablet Take 81 mg by mouth daily.   docusate sodium 100 MG capsule Commonly known as:  COLACE Take 1 capsule (100 mg total) by mouth every 12 (twelve) hours.   EPINEPHrine 0.3 mg/0.3 mL Soaj injection Commonly known as:  EPIPEN 2-PAK Inject 0.3 mLs (0.3 mg total) into the muscle once.   fexofenadine 30 MG tablet Commonly known  as:  ALLEGRA Take 30 mg by mouth 2 (two) times daily.   fluticasone furoate-vilanterol 100-25 MCG/INH Aepb Commonly known as:  BREO ELLIPTA Inhale 1 puff then rinse mouth, once daily   glucose blood test strip Commonly known as:  ONE TOUCH ULTRA TEST Check blood sugars daily.   letrozole 2.5 MG tablet Commonly known as:  FEMARA Take 1 tablet (2.5 mg total) by mouth daily.   levalbuterol 0.63 MG/3ML nebulizer solution Commonly known as:  XOPENEX Take 0.63 mg by nebulization every 4 (four) hours as needed for wheezing or shortness of breath. Reported on 01/11/2016   loratadine 10 MG tablet Commonly known as:  CLARITIN Take 10 mg by mouth daily as needed for allergies.   magnesium hydroxide 400 MG/5ML suspension Commonly known as:  MILK OF MAGNESIA Take 5 mLs by mouth at bedtime.   metFORMIN 1000 MG tablet Commonly known as:  GLUCOPHAGE Take 1 tablet (1,000 mg total) by mouth daily.   multivitamin with minerals Tabs tablet Take 1 tablet by mouth daily.   ONE TOUCH LANCETS Misc Use as directed once daily to check blood sugar.  DX E11.9   polyethylene glycol packet Commonly known as:  MIRALAX Take 17 g by mouth daily.   simvastatin 40 MG tablet Commonly known as:  ZOCOR Take 1 tablet (40 mg total) by mouth at bedtime.   triamterene-hydrochlorothiazide 37.5-25 MG tablet Commonly known as:  MAXZIDE-25 Take 1 tablet by mouth daily.          Objective:   Physical Exam BP 110/80 (BP Location: Left Wrist, Patient Position: Sitting, Cuff Size: Normal)   Pulse 73   Temp 97.9 F (36.6 C) (Oral)   Ht 5\' 3"  (1.6 m)   Wt 148 lb 3.2 oz (67.2 kg)   SpO2 98%   BMI 26.25 kg/m  General:   Well developed, well nourished . NAD.  HEENT:  Normocephalic . Face symmetric, atraumatic Lungs:  CTA B Normal respiratory effort, no intercostal retractions, no accessory muscle use. Heart: RRR,  no murmur.  no pretibial edema bilaterally  Abdomen:  Not distended, soft, slightly  tender at the left lower abdomen without mass or rebound. Skin: Not pale. Not jaundice DIABETIC FEET EXAM: No lower extremity edema Normal pedal pulses bilaterally  Skin normal, nails thick Pinprick examination : Patchy decrease sensitivity throughout. Neurologic:  alert & oriented X3.  Speech normal, gait appropriate for age and unassisted Psych--  Cognition and judgment appear intact.  Cooperative with normal attention span and concentration.  Behavior appropriate. No anxious or depressed appearing.    Assessment & Plan:   Assessment   DM w/ neuropathy HTN Hyperlipidemia Mild hypercalcemia, normal PTH 09-2014 GI: Chronic, severe constipation:  s/p several colonoscopies, very limited d/p poor prep unable to finish exam . Virtual CT 2011 unrevealing . Usually good response to MOM, admitted 2014 d/t impaction, had a flex sig. Virtual cscope 11-2015 neg GB stones  Pulmonary Dr Annamaria Boots --COPD, asthma --OSA CPAP intolerant --Nodules noted on CT-2017, stable. Osteoporosis  (osteopenia plus vertebral Fx per CT), [facility]as at [facility] 2011 normal DEXA; T score 05/2012 -0.9  T score 05-2014 T score -1.37 Oncology  Breast cancer bilateral 2006 --s/ p B mastectomy   --Recurrence L mastectomy site  2011, s/p excision >> XRT, Letrozole For the next 10 years, end 2021 Diplopia, gait disorder: Saw neurology 2015/ 2016. TSH, myasthenia panel were negative. MRI brain no acute Acute urinary retention 08/22/2015  PLAN: DM: On metformin, check A1c. Feet exam today with decrease sensitivity, feet care discussed HTN:   last potassium is slightly low, recheck a BMP High cholesterol: FLP was not checked the last time she was here, check it today. On simvastatin GI: Ongoing left lower abdomen discomfort, two recent CTs with no acute findings but constipation. Recommend to discuss with GI Osteopenia: Referral sent, [facility] response:  next DEXA due 2019 RTC 6 months

## 2017-04-26 ENCOUNTER — Encounter: Payer: Self-pay | Admitting: Vascular Surgery

## 2017-04-26 ENCOUNTER — Other Ambulatory Visit: Payer: Self-pay

## 2017-04-26 DIAGNOSIS — I729 Aneurysm of unspecified site: Secondary | ICD-10-CM

## 2017-04-26 HISTORY — DX: Aneurysm of unspecified site: I72.9

## 2017-04-26 NOTE — Assessment & Plan Note (Addendum)
DM: On metformin, check A1c. Feet exam today with decrease sensitivity, feet care discussed HTN:   last potassium is slightly low, recheck a BMP High cholesterol: FLP was not checked the last time she was here, check it today. On simvastatin GI: Ongoing left lower abdomen discomfort, two recent CTs with no acute findings but constipation. Recommend to discuss with GI Osteopenia: Referral sent, [facility] response:  next DEXA due 2019 RTC 6 months

## 2017-05-09 ENCOUNTER — Encounter: Payer: Self-pay | Admitting: Vascular Surgery

## 2017-05-09 ENCOUNTER — Other Ambulatory Visit: Payer: Self-pay | Admitting: Vascular Surgery

## 2017-05-09 ENCOUNTER — Ambulatory Visit (INDEPENDENT_AMBULATORY_CARE_PROVIDER_SITE_OTHER): Payer: Medicare Other | Admitting: Vascular Surgery

## 2017-05-09 VITALS — BP 139/88 | HR 90 | Temp 98.6°F | Resp 20 | Ht 63.0 in | Wt 147.6 lb

## 2017-05-09 DIAGNOSIS — I729 Aneurysm of unspecified site: Secondary | ICD-10-CM

## 2017-05-09 NOTE — Progress Notes (Signed)
[facility]  Patient name: [name] MRN: [id number] DOB: 27-Sep-1941 Sex: female  REASON FOR VISIT: Evaluate for surgical repair of pudendal artery pseudoaneurysm. Visit requested by Dr. Barbie Banner  HPI: [name] is a 76 y.o. 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 She was seen by Dr. Maryclare Bean for possible coil embolization. She was sent here to evaluate for possible surgical intervention. CT scan back in 2011 demonstrated that the pudendal artery abnormality was less than 1 cm. The patient states that her constipation has improved since started on a new medication. She has had constipation her "whole life" and believes it is secondary to growing up in a household with many siblings a limited bathrooms.   Her past medical history includes hypertension, hyperlipidemia, diabetes and COPD.   Past Medical History:  Diagnosis Date  . Allergic rhinitis   . Asthma    dx in adulthood (76 y/o) PFT 09/08/09 FEV1 1.70/81%; FEV1/FVC 0.75; +resp to dilartor; NI LV/DLCO  . Breast CA ([place])    hx of bilateral diagnosied in 2006, recurred 2011- [name]  . Chronic constipation   . Colonic polyp   . COPD (chronic obstructive pulmonary disease) ([place])   . Diabetes mellitus   . Dislocation of right elbow 2016  . HTN (hypertension) 11-2011  . Hyperlipidemia   . OSA (obstructive sleep apnea)    CPAP intolerant  . Osteopenia   . Peripheral neuropathy   . 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 Urinary retention    During massive constipation (pelvic exam, suspect 8cm or more in diameter)    Family History  Problem Relation Age of Onset  . COPD Mother        smoker  . Hypertension Mother   . Lung cancer Father        died age 76  . Breast cancer Sister        GM,sister  . Diabetes Brother   . Cancer Maternal Grandmother        breast   .  Colon cancer Neg Hx   . Heart attack Neg Hx        no h/o early diseae    SOCIAL HISTORY: Social History   Social History  . Marital status: Widowed    Spouse name: N/A  . Number of children: 2  . Years of education: N/A   Occupational History  . retired Retired   Social History Main Topics  . Smoking status: Former Smoker    Packs/day: 1.00    Years: 4.00    Types: Cigarettes    Quit date: 04/01/1968  . Smokeless tobacco: Former Systems developer    Quit date: 12/11/1967     Comment: Quit at age 75  . Alcohol use No  . Drug use: No  . Sexual activity: Not Currently   Other Topics Concern  . 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 Peanut-Containing Drug Products Swelling  . Penicillins Itching    outer rectal itching    Current Outpatient Prescriptions  Medication Sig Dispense Refill  . albuterol (VENTOLIN HFA) 108 (90 Base) MCG/ACT inhaler INHALE 2 PUFFS INTO THE LUNGS EVERY 4 HOURS AS NEEDED FOR WHEEZING OR SHORTNESS OF BREATH  18 g 12  . aspirin EC 81 MG tablet Take 81 mg by mouth daily.    Marland Kitchen docusate sodium (COLACE) 100 MG capsule Take 1 capsule (100 mg total) by mouth every 12 (twelve) hours. 30 capsule 0  . EPINEPHrine (EPIPEN 2-PAK) 0.3 mg/0.3 mL IJ SOAJ injection Inject 0.3 mLs (0.3 mg total) into the muscle once. 2 Device 1  . fexofenadine (ALLEGRA) 30 MG tablet Take 30 mg by mouth 2 (two) times daily.    . fluticasone furoate-vilanterol (BREO ELLIPTA) 100-25 MCG/INH AEPB Inhale 1 puff then rinse mouth, once daily 60 each 11  . glucose blood (ONE TOUCH ULTRA TEST) test strip Check blood sugars daily. 100 each 12  . letrozole (FEMARA) 2.5 MG tablet Take 1 tablet (2.5 mg total) by mouth daily. 90 tablet 1  . levalbuterol (XOPENEX) 0.63 MG/3ML nebulizer solution Take 0.63 mg by nebulization every 4 (four) hours as needed for wheezing or shortness of  breath. Reported on 01/11/2016    . loratadine (CLARITIN) 10 MG tablet Take 10 mg by mouth daily as needed for allergies.    . magnesium hydroxide (MILK OF MAGNESIA) 400 MG/5ML suspension Take 5 mLs by mouth at bedtime.    . metFORMIN (GLUCOPHAGE) 1000 MG tablet Take 1 tablet (1,000 mg total) by mouth daily. 90 tablet 1  . Multiple Vitamin (MULTIVITAMIN WITH MINERALS) TABS tablet Take 1 tablet by mouth daily.    . ONE TOUCH LANCETS MISC Use as directed once daily to check blood sugar.  DX E11.9 200 each 2  . polyethylene glycol (MIRALAX) packet Take 17 g by mouth daily. 14 each 0  . simvastatin (ZOCOR) 40 MG tablet Take 1 tablet (40 mg total) by mouth at bedtime. 90 tablet 1  . triamterene-hydrochlorothiazide (MAXZIDE-25) 37.5-25 MG tablet Take 1 tablet by mouth daily. 90 tablet 1  . AMITIZA 24 MCG capsule Take 1 capsule by mouth 2 (two) times daily with a meal.   5   No current facility-administered medications for this visit.     REVIEW OF SYSTEMS:  [X]  denotes positive finding, [ ]  denotes negative finding Cardiac  Comments:  Chest pain or chest pressure:    Shortness of breath upon exertion:    Short of breath when lying flat:    Irregular heart rhythm:        Vascular    Pain in calf, thigh, or hip brought on by ambulation: x   Pain in feet at night that wakes you up from your sleep:     Blood clot in your veins:    Leg swelling:         Pulmonary    Oxygen at home:    Productive cough:     Wheezing:         Neurologic    Sudden weakness in arms or legs:     Sudden numbness in arms or legs:     Sudden onset of difficulty speaking or slurred speech:    Temporary loss of vision in one eye:     Problems with dizziness:         Gastrointestinal    Blood in stool:     Vomited blood:         Genitourinary    Burning when urinating:     Blood in urine:        Psychiatric    Major depression:         Hematologic    Bleeding problems:  Problems with blood clotting too  easily:        Skin    Rashes or ulcers:        Constitutional    Fever or chills:      PHYSICAL EXAM: Vitals:   05/09/17 1435  BP: 139/88  Pulse: 90  Resp: 20  Temp: 98.6 F (37 C)  TempSrc: Oral  SpO2: 100%  Weight: 147 lb 9.6 oz (67 kg)  Height: 5\' 3"  (1.6 m)    GENERAL: The patient is a well-nourished female, in no acute distress. The vital signs are documented above. CARDIAC: There is a regular rate and rhythm. No carotid bruits.  VASCULAR: 2+ femoral and pedal pulses bilaterally.  PULMONARY: There is good air exchange bilaterally without wheezing or rales. ABDOMEN: Soft and non-tender.  MUSCULOSKELETAL: There are no major deformities or cyanosis. NEUROLOGIC: No focal weakness or paresthesias are detected. SKIN: There are no ulcers or rashes noted. PSYCHIATRIC: The patient has a normal affect.  DATA:  CT abd/pelvis 04/04/17  IMPRESSION: 1. Rectal impaction with stool and gas retention throughout the dilated colon, also seen on multiple prior studies. 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 2. 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 This may be amenable to percutaneous treatment and the patient could be evaluated in the IR Clinic. 3. Cholelithiasis.    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 Would recommend referral back to IR for embolization. Surgical repair is challenging in this area and could result in significant bleeding. If percutaneous intervention is unsuccessful, we would reconsider surgical intervention. Will refer back to Dr. Barbie Banner.   Virgina Jock, PA-C [facility] MD: Merlyn Bollen  History and exam details as above. Open repair of this pseudoaneurysm would likely be excised [place] difficult with significant blood loss due to difficulty in controlling this blood vessel. I  believe the best option would be to try to percutaneously embolize this first. If this is unsuccessful we could rethink whether or not an open operation is in the patient's best interest. We'll await interventional radiology assessment. The patient will follow-up with me on an as-needed basis.  Ruta Hinds, MD [facility] [place] Office: [phone] Pager: [phone]

## 2017-05-13 ENCOUNTER — Telehealth (HOSPITAL_COMMUNITY): Payer: Self-pay

## 2017-05-13 NOTE — Telephone Encounter (Signed)
Called to schedule f/u, no answer. AW 

## 2017-05-16 ENCOUNTER — Other Ambulatory Visit (HOSPITAL_COMMUNITY): Payer: Self-pay | Admitting: Interventional Radiology

## 2017-05-16 DIAGNOSIS — I729 Aneurysm of unspecified site: Secondary | ICD-10-CM

## 2017-05-21 ENCOUNTER — Other Ambulatory Visit: Payer: Self-pay | Admitting: General Surgery

## 2017-05-22 ENCOUNTER — Other Ambulatory Visit (HOSPITAL_COMMUNITY): Payer: Self-pay | Admitting: Interventional Radiology

## 2017-05-22 ENCOUNTER — Encounter (HOSPITAL_COMMUNITY): Payer: Self-pay

## 2017-05-22 ENCOUNTER — Ambulatory Visit (HOSPITAL_COMMUNITY)
Admission: RE | Admit: 2017-05-22 | Discharge: 2017-05-22 | Disposition: A | Discharge status: Home / Self Care | Payer: Medicare Other | Source: Ambulatory Visit | Attending: Interventional Radiology | Admitting: Interventional Radiology

## 2017-05-22 DIAGNOSIS — I10 Essential (primary) hypertension: Secondary | ICD-10-CM | POA: Diagnosis not present

## 2017-05-22 DIAGNOSIS — Z7951 Long term (current) use of inhaled steroids: Secondary | ICD-10-CM | POA: Insufficient documentation

## 2017-05-22 DIAGNOSIS — I729 Aneurysm of unspecified site: Secondary | ICD-10-CM

## 2017-05-22 DIAGNOSIS — M8580 Other specified disorders of bone density and structure, unspecified site: Secondary | ICD-10-CM | POA: Diagnosis not present

## 2017-05-22 DIAGNOSIS — Z880 Allergy status to penicillin: Secondary | ICD-10-CM | POA: Diagnosis not present

## 2017-05-22 DIAGNOSIS — G4733 Obstructive sleep apnea (adult) (pediatric): Secondary | ICD-10-CM | POA: Diagnosis not present

## 2017-05-22 DIAGNOSIS — K5909 Other constipation: Secondary | ICD-10-CM | POA: Insufficient documentation

## 2017-05-22 DIAGNOSIS — E785 Hyperlipidemia, unspecified: Secondary | ICD-10-CM | POA: Insufficient documentation

## 2017-05-22 DIAGNOSIS — I728 Aneurysm of other specified arteries: Secondary | ICD-10-CM | POA: Diagnosis present

## 2017-05-22 DIAGNOSIS — Z853 Personal history of malignant neoplasm of breast: Secondary | ICD-10-CM | POA: Diagnosis not present

## 2017-05-22 DIAGNOSIS — Z7984 Long term (current) use of oral hypoglycemic drugs: Secondary | ICD-10-CM | POA: Diagnosis not present

## 2017-05-22 DIAGNOSIS — Z87891 Personal history of nicotine dependence: Secondary | ICD-10-CM | POA: Diagnosis not present

## 2017-05-22 DIAGNOSIS — R222 Localized swelling, mass and lump, trunk: Secondary | ICD-10-CM | POA: Diagnosis not present

## 2017-05-22 DIAGNOSIS — E1142 Type 2 diabetes mellitus with diabetic polyneuropathy: Secondary | ICD-10-CM | POA: Insufficient documentation

## 2017-05-22 DIAGNOSIS — Z91010 Allergy to peanuts: Secondary | ICD-10-CM | POA: Diagnosis not present

## 2017-05-22 DIAGNOSIS — J449 Chronic obstructive pulmonary disease, unspecified: Secondary | ICD-10-CM | POA: Insufficient documentation

## 2017-05-22 DIAGNOSIS — Z7982 Long term (current) use of aspirin: Secondary | ICD-10-CM | POA: Insufficient documentation

## 2017-05-22 HISTORY — PX: IR ANGIOGRAM SELECTIVE EACH ADDITIONAL VESSEL: IMG667

## 2017-05-22 HISTORY — PX: IR US GUIDE VASC ACCESS LEFT: IMG2389

## 2017-05-22 HISTORY — PX: IR ANGIOGRAM PELVIS SELECTIVE OR SUPRASELECTIVE: IMG661

## 2017-05-22 LAB — CBC
HCT: 44.9 % (ref 36.0–46.0)
Hemoglobin: 15.1 g/dL — ABNORMAL HIGH (ref 12.0–15.0)
MCH: 30.4 pg (ref 26.0–34.0)
MCHC: 33.6 g/dL (ref 30.0–36.0)
MCV: 90.5 fL (ref 78.0–100.0)
PLATELETS: 284 10*3/uL (ref 150–400)
RBC: 4.96 MIL/uL (ref 3.87–5.11)
RDW: 13.8 % (ref 11.5–15.5)
WBC: 9.4 10*3/uL (ref 4.0–10.5)

## 2017-05-22 LAB — PROTIME-INR
INR: 0.93
PROTHROMBIN TIME: 12.5 s (ref 11.4–15.2)

## 2017-05-22 LAB — GLUCOSE, CAPILLARY: Glucose-Capillary: 93 mg/dL (ref 65–99)

## 2017-05-22 LAB — BASIC METABOLIC PANEL
Anion gap: 11 (ref 5–15)
BUN: 10 mg/dL (ref 6–20)
CO2: 26 mmol/L (ref 22–32)
Calcium: 9.9 mg/dL (ref 8.9–10.3)
Chloride: 98 mmol/L — ABNORMAL LOW (ref 101–111)
Creatinine, Ser: 0.73 mg/dL (ref 0.44–1.00)
GFR calc Af Amer: >60 mL/min (ref >60)
GFR calc non Af Amer: >60 mL/min (ref >60)
Glucose, Bld: 87 mg/dL (ref 65–99)
Potassium: 3.2 mmol/L — ABNORMAL LOW (ref 3.5–5.1)
Sodium: 135 mmol/L (ref 135–145)

## 2017-05-22 MED ORDER — SODIUM CHLORIDE 0.9 % IV SOLN: INTRAVENOUS | Status: DC

## 2017-05-22 MED ORDER — FENTANYL CITRATE (PF) 100 MCG/2ML IJ SOLN
INTRAMUSCULAR | Status: AC | PRN
  Administered 2017-05-22 (×2): 50 ug via INTRAVENOUS

## 2017-05-22 MED ORDER — IOPAMIDOL (ISOVUE-300) INJECTION 61%
INTRAVENOUS | Status: AC
  Administered 2017-05-22: 40 mL
  Filled 2017-05-22: qty 150

## 2017-05-22 MED ORDER — IOPAMIDOL (ISOVUE-300) INJECTION 61%
INTRAVENOUS | Status: AC
  Administered 2017-05-22: 75 mL
  Filled 2017-05-22: qty 100

## 2017-05-22 MED ORDER — FLUMAZENIL 0.5 MG/5ML IV SOLN
INTRAVENOUS | Status: DC
Start: 2017-05-22 — End: 2017-05-22
  Filled 2017-05-22: qty 5

## 2017-05-22 MED ORDER — VANCOMYCIN HCL IN DEXTROSE 1-5 GM/200ML-% IV SOLN
INTRAVENOUS | Status: AC
  Filled 2017-05-22: qty 200

## 2017-05-22 MED ORDER — VANCOMYCIN HCL IN DEXTROSE 1-5 GM/200ML-% IV SOLN
1000.0000 mg | Freq: Once | INTRAVENOUS | Status: AC
  Administered 2017-05-22: 1000 mg via INTRAVENOUS

## 2017-05-22 MED ORDER — LIDOCAINE HCL 1 % IJ SOLN
INTRAMUSCULAR | Status: AC
  Filled 2017-05-22: qty 20

## 2017-05-22 MED ORDER — FENTANYL CITRATE (PF) 100 MCG/2ML IJ SOLN
INTRAMUSCULAR | Status: AC
  Filled 2017-05-22: qty 4

## 2017-05-22 MED ORDER — MIDAZOLAM HCL 2 MG/2ML IJ SOLN
INTRAMUSCULAR | Status: AC | PRN
  Administered 2017-05-22 (×2): 1 mg via INTRAVENOUS
  Administered 2017-05-22 (×2): 0.5 mg via INTRAVENOUS

## 2017-05-22 MED ORDER — LIDOCAINE HCL 1 % IJ SOLN
INTRAMUSCULAR | Status: AC | PRN
  Administered 2017-05-22: 5 mL

## 2017-05-22 MED ORDER — NALOXONE HCL 0.4 MG/ML IJ SOLN
INTRAMUSCULAR | Status: AC
  Filled 2017-05-22: qty 1

## 2017-05-22 MED ORDER — MIDAZOLAM HCL 2 MG/2ML IJ SOLN
INTRAMUSCULAR | Status: AC
Start: 2017-05-22 — End: 2017-05-22
  Filled 2017-05-22: qty 4

## 2017-05-22 NOTE — Discharge Instructions (Addendum)
Restart metformin Saturday  Angiogram, Care After This sheet gives you information about how to care for yourself after your procedure. Your doctor may also give you more specific instructions. If you have problems or questions, contact your doctor. Follow these instructions at home: Insertion site care  Follow instructions from your doctor about how to take care of your long, thin tube (catheter) insertion area. Make sure you: ? Wash your hands with soap and water before you change your bandage (dressing). If you cannot use soap and water, use hand sanitizer. ? Change your bandage as told by your doctor. ? Leave stitches (sutures), skin glue, or skin tape (adhesive) strips in place. They may need to stay in place for 2 weeks or longer. If tape strips get loose and curl up, you may trim the loose edges. Do not remove tape strips completely unless your doctor says it is okay.  Do not take baths, swim, or use a hot tub until your doctor says it is okay.  You may shower 24-48 hours after the procedure or as told by your doctor. ? Gently wash the area with plain soap and water. ? Pat the area dry with a clean towel. ? Do not rub the area. This may cause bleeding.  Do not apply powder or lotion to the area. Keep the area clean and dry.  Check your insertion area every day for signs of infection. Check for: ? More redness, swelling, or pain. ? Fluid or blood. ? Warmth. ? Pus or a bad smell. Activity  Rest as told by your doctor, usually for 1-2 days.  Do not lift anything that is heavier than 10 lbs. (4.5 kg) or as told by your doctor.  Do not drive for 24 hours if you were given a medicine to help you relax (sedative).  Do not drive or use heavy machinery while taking prescription pain medicine. General instructions  Go back to your normal activities as told by your doctor, usually in about a week. Ask your doctor what activities are safe for you.  If the insertion area starts to  bleed, lie flat and put pressure on the area. If the bleeding does not stop, get help right away. This is an emergency.  Drink enough fluid to keep your pee (urine) clear or pale yellow.  Take over-the-counter and prescription medicines only as told by your doctor.  Keep all follow-up visits as told by your doctor. This is important. Contact a doctor if:  You have a fever.  You have chills.  You have more redness, swelling, or pain around your insertion area.  You have fluid or blood coming from your insertion area.  The insertion area feels warm to the touch.  You have pus or a bad smell coming from your insertion area.  You have more bruising around the insertion area.  Blood collects in the tissue around the insertion area (hematoma) that may be painful to the touch. Get help right away if:  You have a lot of pain in the insertion area.  The insertion area swells very fast.  The insertion area is bleeding, and the bleeding does not stop after holding steady pressure on the area.  The area near or just beyond the insertion area becomes pale, cool, tingly, or numb. These symptoms may be an emergency. Do not wait to see if the symptoms will go away. Get medical help right away. Call your local emergency services (911 in the [place] Do not drive yourself  to the hospital. Summary  After the procedure, it is common to have bruising and tenderness at the long, thin tube insertion area.  After the procedure, it is important to rest and drink plenty of fluids.  Do not take baths, swim, or use a hot tub until your doctor says it is okay to do so. You may shower 24-48 hours after the procedure or as told by your doctor.  If the insertion area starts to bleed, lie flat and put pressure on the area. If the bleeding does not stop, get help right away. This is an emergency. This information is not intended to replace advice given to you by your health care provider. Make sure you  discuss any questions you have with your health care provider. Document Released: 02/22/2009 Document Revised: 11/20/2016 Document Reviewed: 11/20/2016 Elsevier Interactive Patient Education  2017 [place]. Moderate Conscious Sedation, Adult, Care After These instructions provide you with information about caring for yourself after your procedure. Your health care provider may also give you more specific instructions. Your treatment has been planned according to current medical practices, but problems sometimes occur. Call your health care provider if you have any problems or questions after your procedure. What can I expect after the procedure? After your procedure, it is common:  To feel sleepy for several hours.  To feel clumsy and have poor balance for several hours.  To have poor judgment for several hours.  To vomit if you eat too soon.  Follow these instructions at home: For at least 24 hours after the procedure:   Do not: ? Participate in activities where you could fall or become injured. ? Drive. ? Use heavy machinery. ? Drink alcohol. ? Take sleeping pills or medicines that cause drowsiness. ? Make important decisions or sign legal documents. ? Take care of children on your own.  Rest. Eating and drinking  Follow the diet recommended by your health care provider.  If you vomit: ? Drink water, juice, or soup when you can drink without vomiting. ? Make sure you have little or no nausea before eating solid foods. General instructions  Have a responsible adult stay with you until you are awake and alert.  Take over-the-counter and prescription medicines only as told by your health care provider.  If you smoke, do not smoke without supervision.  Keep all follow-up visits as told by your health care provider. This is important. Contact a health care provider if:  You keep feeling nauseous or you keep vomiting.  You feel light-headed.  You develop a  rash.  You have a fever. Get help right away if:  You have trouble breathing. This information is not intended to replace advice given to you by your health care provider. Make sure you discuss any questions you have with your health care provider. Document Released: 09/16/2013 Document Revised: 04/30/2016 Document Reviewed: 03/17/2016 Elsevier Interactive Patient Education  [employer].

## 2017-05-22 NOTE — H&P (Signed)
Chief Complaint: Pseudoaneurysm of right pudendal artery  Referring Physician(s): Kathlene November  Supervising Physician: Marybelle Killings  Patient Status: [facility] [place] - Out-pt  History of Present Illness: [name] is a 76 y.o. female  with a history of diabetes mellitus and chronic constipation.  She underwent a CT abdomen pelvis and was incidentally found to have a 2.5 cm pseudoaneurysm emanating from the distal branch of the right pudendal artery.  She was seen in IR clinic on 04/23/2017 by Dr. Barbie Banner for consideration for embolization and she is here today to undergo that procedure.  She feels well today. No fever/chills. No blood thinners. She is NPO. She has held her metformin.   Past Medical History:  Diagnosis Date  . Allergic rhinitis   . Asthma    dx in adulthood (76 y/o) PFT 09/08/09 FEV1 1.70/81%; FEV1/FVC 0.75; +resp to dilartor; NI LV/DLCO  . Breast CA ([place])    hx of bilateral diagnosied in 2006, recurred 2011- [name]  . Chronic constipation   . Colonic polyp   . COPD (chronic obstructive pulmonary disease) ([place])   . Diabetes mellitus   . Dislocation of right elbow 2016  . HTN (hypertension) 11-2011  . Hyperlipidemia   . OSA (obstructive sleep apnea)    CPAP intolerant  . Osteopenia   . Peripheral neuropathy   . 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 Urinary retention    During massive constipation (pelvic exam, suspect 8cm or more in diameter)    Past Surgical History:  Procedure Laterality Date  . ANAL RECTAL MANOMETRY N/A 04/23/2016   Procedure: ANO RECTAL MANOMETRY;  Surgeon: [name], MD;  Location: [facility] ENDOSCOPY;  Service: Endoscopy;  Laterality: N/A;  . APPENDECTOMY     08/2004  . cyct on spine      1967  . ELBOW SURGERY Right 2016   DISLOCATION  . EXPLORATORY LAPAROTOMY    . FLEXIBLE SIGMOIDOSCOPY N/A 01/31/2013   Procedure: FLEXIBLE SIGMOIDOSCOPY;  Surgeon: [name], MD;  Location: [facility] ENDOSCOPY;   Service: Endoscopy;  Laterality: N/A;  unprepped with sedation  . lypoma removed from back-benign 2005     2005  . MASTECTOMY  2006   bilateral  . MASTECTOMY    . Pilonidal Cyst Surgery     ? of  . removal of boils     1970/1980    Allergies: Peanut-containing drug products and Penicillins  Medications: Prior to Admission medications   Medication Sig Start Date End Date Taking? Authorizing Provider  albuterol (VENTOLIN HFA) 108 (90 Base) MCG/ACT inhaler INHALE 2 PUFFS INTO THE LUNGS EVERY 4 HOURS AS NEEDED FOR WHEEZING OR SHORTNESS OF BREATH 12/20/16  Yes Young, Tarri Fuller D, MD  AMITIZA 24 MCG capsule Take 1 capsule by mouth 2 (two) times daily with a meal.  03/18/17  Yes [provider]  aspirin EC 81 MG tablet Take 81 mg by mouth daily.   Yes [provider]  docusate sodium (COLACE) 100 MG capsule Take 1 capsule (100 mg total) by mouth every 12 (twelve) hours. Patient taking differently: Take 100 mg by mouth daily as needed for mild constipation.  04/13/17  Yes Rancour, Annie Main, MD  EPINEPHrine (EPIPEN 2-PAK) 0.3 mg/0.3 mL IJ SOAJ injection Inject 0.3 mLs (0.3 mg total) into the muscle once. 04/11/15  Yes Paz, Alda Berthold, MD  fexofenadine (ALLEGRA) 180 MG tablet Take 180 mg by mouth daily.    Yes [provider]  fluticasone furoate-vilanterol (BREO ELLIPTA) 100-25  MCG/INH AEPB Inhale 1 puff then rinse mouth, once daily 01/11/17  Yes Young, Clinton D, MD  letrozole [facility]) 2.5 MG tablet Take 1 tablet (2.5 mg total) by mouth daily. 04/08/17  Yes Truitt Merle, MD  levalbuterol [facility]) 0.63 MG/3ML nebulizer solution Take 0.63 mg by nebulization every 4 (four) hours as needed for wheezing or shortness of breath. Reported on 01/11/2016   Yes [provider]  magnesium hydroxide (MILK OF MAGNESIA) 400 MG/5ML suspension Take 5 mLs by mouth at bedtime.   Yes [provider]  metFORMIN (GLUCOPHAGE) 1000 MG tablet Take 1 tablet (1,000 mg total) by mouth daily. 10/16/16   Yes Paz, Alda Berthold, MD  Multiple Vitamin (MULTIVITAMIN WITH MINERALS) TABS tablet Take 1 tablet by mouth daily.   Yes [provider]  polyethylene glycol (MIRALAX) packet Take 17 g by mouth daily. 04/13/17  Yes Rancour, Annie Main, MD  simvastatin (ZOCOR) 40 MG tablet Take 1 tablet (40 mg total) by mouth at bedtime. 02/19/17  Yes Paz, Alda Berthold, MD  triamterene-hydrochlorothiazide (MAXZIDE-25) 37.5-25 MG tablet Take 1 tablet by mouth daily. 01/04/17  Yes Paz, Jacqulyn Bath E, MD  glucose blood (ONE TOUCH ULTRA TEST) test strip Check blood sugars daily. 05/17/16   Colon Branch, MD  ONE [facility] LANCETS MISC Use as directed once daily to check blood sugar.  DX E11.9 04/12/16   Colon Branch, MD     Family History  Problem Relation Age of Onset  . COPD Mother        smoker  . Hypertension Mother   . Lung cancer Father        died age 72  . Breast cancer Sister        GM,sister  . Diabetes Brother   . Cancer Maternal Grandmother        breast   . Colon cancer Neg Hx   . Heart attack Neg Hx        no h/o early diseae    Social History   Social History  . Marital status: Widowed    Spouse name: N/A  . Number of children: 2  . Years of education: N/A   Occupational History  . retired Retired   Social History Main Topics  . Smoking status: Former Smoker    Packs/day: 1.00    Years: 4.00    Types: Cigarettes    Quit date: 04/01/1968  . Smokeless tobacco: Former Systems developer    Quit date: 12/11/1967     Comment: Quit at age 59  . Alcohol use No  . Drug use: No  . Sexual activity: Not Currently   Other Topics Concern  . None   Social History Narrative   Widowed last husband 4-10, lives alone, retired Pharmacist, hospital   2 children    [place] twins      Review of Systems: A 12 point ROS discussed  Review of Systems  Constitutional: Negative.   HENT: Negative.   Respiratory: Negative.   Cardiovascular: Negative.   Gastrointestinal: Positive for constipation.  Genitourinary: Negative.   Musculoskeletal:  Negative.   Skin: Negative.   Neurological: Negative.   Hematological: Negative.   Psychiatric/Behavioral: Negative.     Vital Signs: BP (!) 157/94   Pulse 75   Temp 97.7 F (36.5 C) (Oral)   Resp 16   Ht 5' 3.5" (1.613 m)   Wt 148 lb (67.1 kg)   SpO2 100%   BMI 25.81 kg/m   Physical Exam  Constitutional: She is oriented  to person, place, and time. She appears well-developed and well-nourished.  HENT:  Head: Normocephalic and atraumatic.  Eyes: EOM are normal.  Neck: Normal range of motion.  Cardiovascular: Normal rate, regular rhythm, normal heart sounds and intact distal pulses.   Pulmonary/Chest: Effort normal and breath sounds normal. No respiratory distress. She has no wheezes.  Abdominal: She exhibits distension.  Distended secondary to chronic constipation  Musculoskeletal: Normal range of motion.  Neurological: She is alert and oriented to person, place, and time.  Skin: Skin is warm and dry.  Psychiatric: She has a normal mood and affect. Her behavior is normal. Judgment and thought content normal.  Vitals reviewed.   Mallampati Score:  MD Evaluation Airway: WNL Heart: WNL Abdomen: WNL Chest/ Lungs: WNL ASA  Classification: 2 Mallampati/Airway Score: One  Imaging: No results found.  Labs:  CBC:  Recent Labs  09/24/16 1010 04/08/17 1515 04/13/17 0007 05/22/17 0659  WBC 11.1* 9.3 7.8 9.4  HGB 15.1 14.2 13.4 15.1*  HCT 44.8 42.3 40.8 44.9  PLT 369 388 312 284    COAGS: No results for input(s): INR, APTT in the last 8760 hours.  BMP:  Recent Labs  09/24/16 1010 04/08/17 1515 04/13/17 0007 04/25/17 1051  NA 135* 139 134* 136  K 3.6 3.8 3.3* 3.8  CL  --   --  101 99  CO2 28 25 23 26   GLUCOSE 98 85 80 84  BUN 12.8 16.9 12 16   CALCIUM 10.9* 10.2 9.8 10.6*  CREATININE 0.8 0.8 0.81 0.71  GFRNONAA  --   --  >60  --   GFRAA  --   --  >60  --     LIVER FUNCTION TESTS:  Recent Labs  09/24/16 1010 04/08/17 1515 04/13/17 0007    BILITOT 0.77 0.62 0.9  AST 17 18 17   ALT 12 14 11*  ALKPHOS 79 63 49  PROT 7.8 7.2 6.3*  ALBUMIN 4.2 4.0 3.8    TUMOR MARKERS: No results for input(s): AFPTM, CEA, CA199, CHROMGRNA in the last 8760 hours.  Assessment and Plan:  Pseudoaneurysm of pudendal artery.  Will proceed with left CFA access, arteriogram and embolization by Dr. Barbie Banner.  Risks and Benefits discussed with the patient including, but not limited to bleeding, infection, vascular injury or contrast induced renal failure.  All of the patient's questions were answered, patient is agreeable to proceed. Consent signed and in chart.  Electronically Signed: Murrell Redden, PA-C 05/22/2017, 7:53 AM   I spent a total of  25 Minutes in face to face in clinical consultation, greater than 50% of which was counseling/coordinating care for embolization of pseudoaneurysm

## 2017-05-22 NOTE — Procedures (Signed)
R pelvic angiogram  Findings: R gluteal subcutaneous abnormality is tumor (not aneurysm)  Embolization not performed.  EBL 0 Comp 0

## 2017-05-24 ENCOUNTER — Encounter: Payer: Self-pay | Admitting: Interventional Radiology

## 2017-05-24 ENCOUNTER — Other Ambulatory Visit: Payer: Self-pay | Admitting: Internal Medicine

## 2017-05-27 ENCOUNTER — Telehealth: Payer: Self-pay | Admitting: Internal Medicine

## 2017-05-27 ENCOUNTER — Other Ambulatory Visit (HOSPITAL_COMMUNITY): Payer: Self-pay | Admitting: Interventional Radiology

## 2017-05-27 DIAGNOSIS — I729 Aneurysm of unspecified site: Secondary | ICD-10-CM

## 2017-05-27 NOTE — Telephone Encounter (Signed)
Caller name:Latessa Guidry Relationship to patient: Can be reached:[phone] Pharmacy:  Reason for call:Patient is requesting to know what her next step in treatment is

## 2017-05-27 NOTE — Telephone Encounter (Signed)
Spoke w/ Pt, informed that PCP is out of office until 6/25, informed I would send message to him but she may not get response until he gets back. Pt verbalized understanding.

## 2017-05-29 ENCOUNTER — Other Ambulatory Visit: Payer: Medicare Other

## 2017-06-03 NOTE — Telephone Encounter (Signed)
Can any of you help me to schedule her to see Mendel Ryder or Lattie Haw and decide if she needs staging scan before biopsy (if difficult) and arrange IR biopsy. I remember that I got a call from radiology a few weeks ago, but probably forgot to arrange biopsy or f/u. Hope we can get it taken care this week. If no one is available to see her, please at least schedule IR biopsy ASAP and I will see her next week.  Thanks much,  Krista Blue

## 2017-06-03 NOTE — Telephone Encounter (Signed)
Spoke w/ pt, results reviewed, see below, will forward this message to oncology Dr Burr Medico asking for next steps JP  Selective angiography of the right pudendal artery demonstrates that the abnormality in the right gluteal region is not an aneurysm nor is it a pseudoaneurysm. It is actually a neoplasm. Malignancy is not excluded. The patient does have a history of breast cancer. Percutaneous biopsy is feasible and can be performed after consultation with oncology.

## 2017-06-03 NOTE — Telephone Encounter (Signed)
[name], NP notified of Dr. Ernestina Penna instructions.  OK to see [name] on Wed 06/05/17 at 0930 am.   Spoke with pt at home, and gave pt appt for f/u with NP on 06/05/17.  Pt voiced understanding.

## 2017-06-04 NOTE — Progress Notes (Signed)
[facility] Follow up:    Colon Branch, MD [address] [place] [address]   DIAGNOSIS: Cancer Staging No matching staging information was found for the patient.  SUMMARY OF ONCOLOGIC HISTORY: 1. Patient underwent a screening mammogram and abnormalities were detected in her breasts. Biopsy was positive for invasive cancer. She underwent a bilateral mastectomy on 01/30/05 by Dr. Excell Seltzer. 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   2. Patient later had recurrence along the left mastectomy site of invasive ductal carcinoma, lymph nodes were negative and it was excised on 04/18/10 with negative margins, she underwent radiation therapy, followed by adjuvant Letrozole  CURRENT THERAPY: obs  INTERVAL HISTORY: [name] 76 y.o. female returns for evaluation of a right subcutaneous gluteal mass, that was previously thought to be a pseudoaneurysm. She continues to lose weight, and isn't sure why she has an appointment today.    Patient Active Problem List   Diagnosis Date Noted  . 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 Aortic atherosclerosis ([place]) 01/06/2017  . Ileus ([place]) 10/02/2015  . PCP NOTES >>>>>>>>>>>>>>>>>>>>>>>>>>>>>> 09/03/2015  . Recurrent cancer of left breast ([place]) 03/16/2015  . Diplopia 10/07/2014  . Chronic insomnia 08/31/2012  . GERD (gastroesophageal reflux disease) 08/31/2012  . Hypertension 11/26/2011  . Annual physical exam 08/29/2011  . Hives/ angioedema 06/22/2011  . CONSTIPATION, CHRONIC 02/05/2011  . ALLERGIC REACTION, ACUTE 11/27/2010  . HERPES SIMPLEX INFECTION 10/17/2010  . SHOULDER, PAIN 04/07/2010  . LEG PAIN 06/09/2009  . ABDOMINAL PAIN, LEFT LOWER QUADRANT 06/09/2009  . Hyperlipidemia 11/04/2007  . DM II  (diabetes mellitus, type II), controlled ([place]) 08/04/2007  . OBESITY 05/16/2007  . Obstructive sleep apnea 05/16/2007  . PERIPHERAL NEUROPATHY 05/16/2007  . Seasonal allergic rhinitis 05/16/2007  . Moderate persistent asthmatic bronchitis with acute exacerbation 05/16/2007  . Osteopenia 05/16/2007  . BREAST CANCER, HX OF 05/16/2007  . COLONIC POLYPS, HX OF 05/16/2007    is allergic to peanut-containing drug products and penicillins.  MEDICAL HISTORY: Past Medical History:  Diagnosis Date  . Allergic rhinitis   . Asthma    dx in adulthood (76 y/o) PFT 09/08/09 FEV1 1.70/81%; FEV1/FVC 0.75; +resp to dilartor; NI LV/DLCO  . Breast CA ([place])    hx of bilateral diagnosied in 2006, recurred 2011- [name]  . Chronic constipation   . Colonic polyp   . COPD (chronic obstructive pulmonary disease) ([place])   . Diabetes mellitus   . Dislocation of right elbow 2016  . HTN (hypertension) 11-2011  . Hyperlipidemia   . OSA (obstructive sleep apnea)    CPAP intolerant  . Osteopenia   . Peripheral neuropathy   . 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 Urinary retention    During massive constipation (pelvic exam, suspect 8cm or more in diameter)    SURGICAL HISTORY: Past Surgical History:  Procedure Laterality Date  . ANAL RECTAL MANOMETRY N/A 04/23/2016   Procedure: ANO RECTAL MANOMETRY;  Surgeon: [name], MD;  Location: [facility] ENDOSCOPY;  Service: Endoscopy;  Laterality: N/A;  . APPENDECTOMY     08/2004  . cyct on spine      1967  . ELBOW SURGERY Right 2016   DISLOCATION  . EXPLORATORY LAPAROTOMY    . FLEXIBLE SIGMOIDOSCOPY N/A 01/31/2013  Procedure: FLEXIBLE SIGMOIDOSCOPY;  Surgeon: [name], MD;  Location: [facility] ENDOSCOPY;  Service: Endoscopy;  Laterality: N/A;  unprepped with sedation  . IR ANGIOGRAM PELVIS SELECTIVE OR SUPRASELECTIVE  05/22/2017  . IR ANGIOGRAM SELECTIVE EACH ADDITIONAL VESSEL  05/22/2017  . IR RADIOLOGIST EVAL & MGMT  04/23/2017  .  IR US GUIDE VASC ACCESS LEFT  05/22/2017  . lypoma removed from back-benign 2005     2005  . MASTECTOMY  2006   bilateral  . MASTECTOMY    . Pilonidal Cyst Surgery     ? of  . removal of boils     1970/1980    SOCIAL HISTORY: Social History   Social History  . Marital status: Widowed    Spouse name: N/A  . Number of children: 2  . Years of education: N/A   Occupational History  . retired Retired   Social History Main Topics  . Smoking status: Former Smoker    Packs/day: 1.00    Years: 4.00    Types: Cigarettes    Quit date: 04/01/1968  . Smokeless tobacco: Former Systems developer    Quit date: 12/11/1967     Comment: Quit at age 67  . Alcohol use No  . Drug use: No  . Sexual activity: Not Currently   Other Topics Concern  . 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 COPD Mother        smoker  . Hypertension Mother   . Lung cancer Father        died age 61  . Breast cancer Sister        GM,sister  . Diabetes Brother   . Cancer Maternal Grandmother        breast   . Colon cancer Neg Hx   . Heart attack Neg Hx        no h/o early diseae    Review of Systems  Constitutional: Positive for unexpected weight change. Negative for appetite change, chills, diaphoresis, fatigue and fever.  HENT:   Negative for hearing loss and lump/mass.   Eyes: Negative for eye problems and icterus.  Respiratory: Negative for chest tightness, cough and shortness of breath.   Cardiovascular: Negative for chest pain, leg swelling and palpitations.  Gastrointestinal: Positive for abdominal pain (chronic, LLQ) and constipation. Negative for abdominal distention.  Endocrine: Negative for hot flashes.  Genitourinary: Negative for difficulty urinating.   Musculoskeletal: Negative for arthralgias.  Skin: Negative for itching and rash.   Neurological: Negative for dizziness, extremity weakness and headaches.  Hematological: Negative for adenopathy. Does not bruise/bleed easily.  Psychiatric/Behavioral: Negative for depression. The patient is not nervous/anxious.       PHYSICAL EXAMINATION  ECOG PERFORMANCE STATUS: 1 - Symptomatic but completely ambulatory  Vitals:   06/05/17 0946  BP: 130/79  Pulse: 85  Resp: 16  Temp: 98.3 F (36.8 C)    Physical Exam  Constitutional: She is oriented to person, place, and time and well-developed, well-nourished, and in no distress.  HENT:  Head: Normocephalic and atraumatic.  Mouth/Throat: Oropharynx is clear and moist. No oropharyngeal exudate.  Eyes: Pupils are equal, round, and reactive to light. No scleral icterus.  Neck: Neck supple.  Cardiovascular: Normal rate, regular rhythm and normal heart sounds.   Pulmonary/Chest: Effort normal and  breath sounds normal. No respiratory distress. She has no wheezes.  Abdominal: Soft. Bowel sounds are normal. She exhibits no distension. There is no tenderness.  Musculoskeletal: She exhibits no edema.  Lymphadenopathy:    She has no cervical adenopathy.  Neurological: She is alert and oriented to person, place, and time.  Skin: Skin is warm and dry. No rash noted.  Psychiatric: Mood and affect normal.    LABORATORY DATA:  CBC    Component Value Date/Time   WBC 9.4 05/22/2017 0659   RBC 4.96 05/22/2017 0659   HGB 15.1 (H) 05/22/2017 0659   HGB 14.2 04/08/2017 1515   HCT 44.9 05/22/2017 0659   HCT 42.3 04/08/2017 1515   PLT 284 05/22/2017 0659   PLT 388 04/08/2017 1515   MCV 90.5 05/22/2017 0659   MCV 90.6 04/08/2017 1515   MCH 30.4 05/22/2017 0659   MCHC 33.6 05/22/2017 0659   RDW 13.8 05/22/2017 0659   RDW 14.6 (H) 04/08/2017 1515   LYMPHSABS 1.9 04/13/2017 0007   LYMPHSABS 1.6 04/08/2017 1515   MONOABS 0.7 04/13/2017 0007   MONOABS 0.6 04/08/2017 1515   EOSABS 0.3 04/13/2017 0007   EOSABS 0.3 04/08/2017 1515    BASOSABS 0.1 04/13/2017 0007   BASOSABS 0.1 04/08/2017 1515    CMP     Component Value Date/Time   NA 135 05/22/2017 0659   NA 139 04/08/2017 1515   K 3.2 (L) 05/22/2017 0659   K 3.8 04/08/2017 1515   CL 98 (L) 05/22/2017 0659   CL 105 12/08/2012 1409   CO2 26 05/22/2017 0659   CO2 25 04/08/2017 1515   GLUCOSE 87 05/22/2017 0659   GLUCOSE 85 04/08/2017 1515   GLUCOSE 83 12/08/2012 1409   BUN 10 05/22/2017 0659   BUN 16.9 04/08/2017 1515   CREATININE 0.73 05/22/2017 0659   CREATININE 0.8 04/08/2017 1515   CALCIUM 9.9 05/22/2017 0659   CALCIUM 10.2 04/08/2017 1515   PROT 6.3 (L) 04/13/2017 0007   PROT 7.2 04/08/2017 1515   ALBUMIN 3.8 04/13/2017 0007   ALBUMIN 4.0 04/08/2017 1515   AST 17 04/13/2017 0007   AST 18 04/08/2017 1515   ALT 11 (L) 04/13/2017 0007   ALT 14 04/08/2017 1515   ALKPHOS 49 04/13/2017 0007   ALKPHOS 63 04/08/2017 1515   BILITOT 0.9 04/13/2017 0007   BILITOT 0.62 04/08/2017 1515   GFRNONAA >60 05/22/2017 0659   GFRAA >60 05/22/2017 0659    ASSESSMENT and PLAN:   Recurrent cancer of left breast ([place]) I reviewed the pelvic angiogram results with the patient.  She was unaware of the concern for cancer recurrence.  I called and spoke with Dr. Barbie Banner who has agreed to do the biopsy.  I also am concerned since this appeared as aneurysm on CT, if there is any other occult neoplasm.  I will order PET scan to evaluate.    Right know the biopsy is scheduled for 7/18 and so we will hopefully have the PET scan scheduled for around that time also.  I have requested f/u with Dr. Burr Medico for 7/23.     Orders Placed This Encounter  Procedures  . CT guided needle placement    Standing Status:   Future    Standing Expiration Date:   09/05/2018    Order Specific Question:   If indicated for the ordered procedure, I authorize the administration of contrast media per Radiology protocol    Answer:   Yes    Order Specific Question:  Reason for Exam (SYMPTOM  OR DIAGNOSIS  REQUIRED)    Answer:   biopsy of neoplasm of mass in the right gluteal subcutaneous fat, prefer Dr. Barbie Banner    Order Specific Question:   Preferred imaging location?    Answer:   [facility]    Order Specific Question:   Radiology Contrast Protocol - do NOT remove file path    Answer:   \\[email or web address].pdf  . NM PET Image Restag (PS) Skull Base To Thigh    Standing Status:   Future    Standing Expiration Date:   06/05/2018    Order Specific Question:   Reason for Exam (SYMPTOM  OR DIAGNOSIS REQUIRED)    Answer:   breast cancer with occult malignancy found, restage    Order Specific Question:   If indicated for the ordered procedure, I authorize the administration of a radiopharmaceutical per Radiology protocol    Answer:   Yes    Order Specific Question:   Preferred imaging location?    Answer:   [facility]    Order Specific Question:   Radiology Contrast Protocol - do NOT remove file path    Answer:   \\[email or web address]    All questions were answered. The patient knows to call the clinic with any problems, questions or concerns. We can certainly see the patient much sooner if necessary.  A total of (30) minutes of face-to-face time was spent with this patient with greater than 50% of that time in counseling and care-coordination.  This note was electronically signed. Scot Dock, NP 06/05/2017

## 2017-06-05 ENCOUNTER — Encounter: Payer: Self-pay | Admitting: Adult Health

## 2017-06-05 ENCOUNTER — Telehealth: Payer: Self-pay | Admitting: Hematology

## 2017-06-05 ENCOUNTER — Ambulatory Visit (HOSPITAL_BASED_OUTPATIENT_CLINIC_OR_DEPARTMENT_OTHER): Payer: Medicare Other | Admitting: Adult Health

## 2017-06-05 VITALS — BP 130/79 | HR 85 | Temp 98.3°F | Resp 16 | Ht 63.5 in | Wt 143.9 lb

## 2017-06-05 DIAGNOSIS — R222 Localized swelling, mass and lump, trunk: Secondary | ICD-10-CM | POA: Diagnosis not present

## 2017-06-05 DIAGNOSIS — I729 Aneurysm of unspecified site: Secondary | ICD-10-CM

## 2017-06-05 DIAGNOSIS — Z853 Personal history of malignant neoplasm of breast: Secondary | ICD-10-CM | POA: Diagnosis not present

## 2017-06-05 DIAGNOSIS — C50912 Malignant neoplasm of unspecified site of left female breast: Secondary | ICD-10-CM

## 2017-06-05 NOTE — Assessment & Plan Note (Addendum)
I reviewed the pelvic angiogram results with the patient.  She was unaware of the concern for cancer recurrence.  I called and spoke with Dr. Barbie Banner who has agreed to do the biopsy.  I also am concerned since this appeared as aneurysm on CT, if there is any other occult neoplasm.  I will order PET scan to evaluate.    Right know the biopsy is scheduled for 7/18 and so we will hopefully have the PET scan scheduled for around that time also.  I have requested f/u with Dr. Burr Medico for 7/23.

## 2017-06-05 NOTE — Telephone Encounter (Signed)
lvm to inform pt of 7/23 appt at 1 pm per sch msg

## 2017-06-11 ENCOUNTER — Other Ambulatory Visit: Payer: Self-pay | Admitting: Internal Medicine

## 2017-06-13 ENCOUNTER — Other Ambulatory Visit: Payer: Self-pay | Admitting: Radiology

## 2017-06-14 ENCOUNTER — Ambulatory Visit (HOSPITAL_COMMUNITY)
Admission: RE | Admit: 2017-06-14 | Discharge: 2017-06-14 | Disposition: A | Discharge status: Home / Self Care | Payer: Medicare Other | Source: Ambulatory Visit | Attending: Adult Health | Admitting: Adult Health

## 2017-06-14 ENCOUNTER — Other Ambulatory Visit: Payer: Self-pay | Admitting: Adult Health

## 2017-06-14 ENCOUNTER — Encounter (HOSPITAL_COMMUNITY): Payer: Self-pay

## 2017-06-14 DIAGNOSIS — Z7951 Long term (current) use of inhaled steroids: Secondary | ICD-10-CM | POA: Diagnosis not present

## 2017-06-14 DIAGNOSIS — Z7984 Long term (current) use of oral hypoglycemic drugs: Secondary | ICD-10-CM | POA: Diagnosis not present

## 2017-06-14 DIAGNOSIS — Z853 Personal history of malignant neoplasm of breast: Secondary | ICD-10-CM | POA: Insufficient documentation

## 2017-06-14 DIAGNOSIS — E1142 Type 2 diabetes mellitus with diabetic polyneuropathy: Secondary | ICD-10-CM | POA: Insufficient documentation

## 2017-06-14 DIAGNOSIS — M8580 Other specified disorders of bone density and structure, unspecified site: Secondary | ICD-10-CM | POA: Diagnosis not present

## 2017-06-14 DIAGNOSIS — I10 Essential (primary) hypertension: Secondary | ICD-10-CM | POA: Insufficient documentation

## 2017-06-14 DIAGNOSIS — K5909 Other constipation: Secondary | ICD-10-CM | POA: Insufficient documentation

## 2017-06-14 DIAGNOSIS — E785 Hyperlipidemia, unspecified: Secondary | ICD-10-CM | POA: Diagnosis not present

## 2017-06-14 DIAGNOSIS — Z7982 Long term (current) use of aspirin: Secondary | ICD-10-CM | POA: Diagnosis not present

## 2017-06-14 DIAGNOSIS — Z880 Allergy status to penicillin: Secondary | ICD-10-CM | POA: Diagnosis not present

## 2017-06-14 DIAGNOSIS — I729 Aneurysm of unspecified site: Secondary | ICD-10-CM

## 2017-06-14 DIAGNOSIS — G4733 Obstructive sleep apnea (adult) (pediatric): Secondary | ICD-10-CM | POA: Insufficient documentation

## 2017-06-14 DIAGNOSIS — D171 Benign lipomatous neoplasm of skin and subcutaneous tissue of trunk: Secondary | ICD-10-CM | POA: Diagnosis not present

## 2017-06-14 DIAGNOSIS — J449 Chronic obstructive pulmonary disease, unspecified: Secondary | ICD-10-CM | POA: Insufficient documentation

## 2017-06-14 DIAGNOSIS — L988 Other specified disorders of the skin and subcutaneous tissue: Secondary | ICD-10-CM | POA: Diagnosis present

## 2017-06-14 DIAGNOSIS — C50912 Malignant neoplasm of unspecified site of left female breast: Secondary | ICD-10-CM

## 2017-06-14 DIAGNOSIS — Z91010 Allergy to peanuts: Secondary | ICD-10-CM | POA: Insufficient documentation

## 2017-06-14 HISTORY — PX: IR US GUIDE BX ASP/DRAIN: IMG2392

## 2017-06-14 LAB — GLUCOSE, CAPILLARY: GLUCOSE-CAPILLARY: 82 mg/dL (ref 65–99)

## 2017-06-14 LAB — CBC
HCT: 41.9 % (ref 36.0–46.0)
Hemoglobin: 14.4 g/dL (ref 12.0–15.0)
MCH: 30.5 pg (ref 26.0–34.0)
MCHC: 34.4 g/dL (ref 30.0–36.0)
MCV: 88.8 fL (ref 78.0–100.0)
PLATELETS: 327 10*3/uL (ref 150–400)
RBC: 4.72 MIL/uL (ref 3.87–5.11)
RDW: 13.7 % (ref 11.5–15.5)
WBC: 10.1 10*3/uL (ref 4.0–10.5)

## 2017-06-14 LAB — PROTIME-INR
INR: 0.98
Prothrombin Time: 13 seconds (ref 11.4–15.2)

## 2017-06-14 LAB — APTT: APTT: 33 s (ref 24–36)

## 2017-06-14 MED ORDER — MIDAZOLAM HCL 2 MG/2ML IJ SOLN
INTRAMUSCULAR | Status: AC | PRN
  Administered 2017-06-14 (×2): 1 mg via INTRAVENOUS

## 2017-06-14 MED ORDER — FENTANYL CITRATE (PF) 100 MCG/2ML IJ SOLN
INTRAMUSCULAR | Status: AC | PRN
  Administered 2017-06-14: 50 ug via INTRAVENOUS
  Administered 2017-06-14: 25 ug via INTRAVENOUS

## 2017-06-14 MED ORDER — SODIUM CHLORIDE 0.9 % IV SOLN
INTRAVENOUS | Status: DC
  Administered 2017-06-14: 08:00:00 via INTRAVENOUS

## 2017-06-14 MED ORDER — FLUMAZENIL 0.5 MG/5ML IV SOLN
INTRAVENOUS | Status: AC
  Filled 2017-06-14: qty 5

## 2017-06-14 MED ORDER — FENTANYL CITRATE (PF) 100 MCG/2ML IJ SOLN
INTRAMUSCULAR | Status: AC
  Filled 2017-06-14: qty 6

## 2017-06-14 MED ORDER — HYDROCODONE-ACETAMINOPHEN 5-325 MG PO TABS: 1.0000 | ORAL_TABLET | ORAL | Status: DC | PRN

## 2017-06-14 MED ORDER — MIDAZOLAM HCL 2 MG/2ML IJ SOLN
INTRAMUSCULAR | Status: AC
  Filled 2017-06-14: qty 6

## 2017-06-14 MED ORDER — LIDOCAINE HCL 1 % IJ SOLN
10.0000 mL | Freq: Once | INTRAMUSCULAR | Status: AC
  Administered 2017-06-14: 10 mL

## 2017-06-14 MED ORDER — NALOXONE HCL 0.4 MG/ML IJ SOLN
INTRAMUSCULAR | Status: AC
  Filled 2017-06-14: qty 1

## 2017-06-14 MED ORDER — LIDOCAINE HCL (PF) 1 % IJ SOLN: 10.0000 mL | Freq: Once | INTRAMUSCULAR | Status: DC

## 2017-06-14 NOTE — H&P (Signed)
Referring Physician(s): Feng,Y  Supervising Physician: Markus Daft  Patient Status:  [facility]  Chief Complaint: "I'm having a biopsy"   Subjective:  Patient familiar to IR service from recent pelvic arteriogram on 05/22/17 to evaluate for possible pseudoaneurysm of right pudendal artery. Arteriography revealed that the area in question was not an aneurysm or pseudoaneurysm but actually a neoplasm. She does have history of both right and left breast cancers in 2006 with recurrence in 2011. She is currently on letrozole. She presents today for image guided biopsy of this right gluteal subcutaneous mass. She currently denies fever, headache, chest pain, dyspnea, back pain, nausea, vomiting or abnormal bleeding. She does have occasional cough, constipation, intermittent left lower quadrant discomfort and weight loss. Past Medical History:  Diagnosis Date  . Allergic rhinitis   . Asthma    dx in adulthood (76 y/o) PFT 09/08/09 FEV1 1.70/81%; FEV1/FVC 0.75; +resp to dilartor; NI LV/DLCO  . Breast CA ([place])    hx of bilateral diagnosied in 2006, recurred 2011- [name]  . Chronic constipation   . Colonic polyp   . COPD (chronic obstructive pulmonary disease) ([place])   . Diabetes mellitus   . Dislocation of right elbow 2016  . HTN (hypertension) 11-2011  . Hyperlipidemia   . OSA (obstructive sleep apnea)    CPAP intolerant  . Osteopenia   . Peripheral neuropathy   . 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 Urinary retention    During massive constipation (pelvic exam, suspect 8cm or more in diameter)   No birth history on file. Past Surgical History:  Procedure Laterality Date  . ANAL RECTAL MANOMETRY N/A 04/23/2016   Procedure: ANO RECTAL MANOMETRY;  Surgeon: [name], MD;  Location: [facility] ENDOSCOPY;  Service: Endoscopy;  Laterality: N/A;  . APPENDECTOMY     08/2004  . cyct on spine      1967  . ELBOW SURGERY Right 2016   DISLOCATION  . EXPLORATORY LAPAROTOMY     . FLEXIBLE SIGMOIDOSCOPY N/A 01/31/2013   Procedure: FLEXIBLE SIGMOIDOSCOPY;  Surgeon: [name], MD;  Location: [facility] ENDOSCOPY;  Service: Endoscopy;  Laterality: N/A;  unprepped with sedation  . IR ANGIOGRAM PELVIS SELECTIVE OR SUPRASELECTIVE  05/22/2017  . IR ANGIOGRAM SELECTIVE EACH ADDITIONAL VESSEL  05/22/2017  . IR RADIOLOGIST EVAL & MGMT  04/23/2017  . IR US GUIDE VASC ACCESS LEFT  05/22/2017  . lypoma removed from back-benign 2005     2005  . MASTECTOMY  2006   bilateral  . MASTECTOMY    . Pilonidal Cyst Surgery     ? of  . removal of boils     1970/1980     Allergies: Peanut-containing drug products and Penicillins  Medications: Prior to Admission medications   Medication Sig Start Date End Date Taking? Authorizing Provider  albuterol (VENTOLIN HFA) 108 (90 Base) MCG/ACT inhaler INHALE 2 PUFFS INTO THE LUNGS EVERY 4 HOURS AS NEEDED FOR WHEEZING OR SHORTNESS OF BREATH 12/20/16  Yes Young, Tarri Fuller D, MD  AMITIZA 24 MCG capsule Take 1 capsule by mouth 2 (two) times daily with a meal.  03/18/17  Yes [provider]  docusate sodium (COLACE) 100 MG capsule Take 1 capsule (100 mg total) by mouth every 12 (twelve) hours. Patient taking differently: Take 100 mg by mouth daily as needed for mild constipation.  04/13/17  Yes Rancour, Annie Main, MD  fexofenadine (ALLEGRA) 180 MG tablet Take 180 mg by mouth daily.    Yes [provider]  fluticasone furoate-vilanterol (BREO ELLIPTA) 100-25 MCG/INH AEPB Inhale 1 puff then rinse mouth, once daily 01/11/17  Yes Young, Clinton D, MD  letrozole [facility]) 2.5 MG tablet Take 1 tablet (2.5 mg total) by mouth daily. 04/08/17  Yes Truitt Merle, MD  magnesium hydroxide (MILK OF MAGNESIA) 400 MG/5ML suspension Take 5 mLs by mouth at bedtime.   Yes [provider]  metFORMIN (GLUCOPHAGE) 1000 MG tablet Take 1 tablet (1,000 mg total) by mouth daily. 06/11/17  Yes Paz, Alda Berthold, MD  Multiple Vitamin (MULTIVITAMIN WITH MINERALS) TABS  tablet Take 1 tablet by mouth daily.   Yes [provider]  polyethylene glycol (MIRALAX) packet Take 17 g by mouth daily. 04/13/17  Yes Rancour, Annie Main, MD  simvastatin (ZOCOR) 40 MG tablet Take 1 tablet (40 mg total) by mouth at bedtime. 02/19/17  Yes Paz, Alda Berthold, MD  triamterene-hydrochlorothiazide (MAXZIDE-25) 37.5-25 MG tablet Take 1 tablet by mouth daily. 01/04/17  Yes Colon Branch, MD  aspirin EC 81 MG tablet Take 81 mg by mouth daily.    [provider]  EPINEPHrine (EPIPEN 2-PAK) 0.3 mg/0.3 mL IJ SOAJ injection Inject 0.3 mLs (0.3 mg total) into the muscle once. 04/11/15   Colon Branch, MD  glucose blood (ONE TOUCH ULTRA TEST) test strip Check blood sugars daily. 05/24/17   Colon Branch, MD  levalbuterol Penne Lash) 0.63 MG/3ML nebulizer solution Take 0.63 mg by nebulization every 4 (four) hours as needed for wheezing or shortness of breath. Reported on 01/11/2016    [provider]  ONE TOUCH LANCETS MISC Use as directed once daily to check blood sugar.  DX E11.9 04/12/16   Colon Branch, MD     Vital Signs: BP 136/74 (BP Location: Right Arm)   Pulse 78   Temp 97.7 F (36.5 C) (Oral)   Resp 16   Ht 5' 3.5" (1.613 m)   Wt 144 lb (65.3 kg)   SpO2 99%   BMI 25.11 kg/m   Physical Exam awake, alert. Chest with distant breath sounds bilaterally. Heart with regular rate and rhythm. Abdomen soft, positive bowel sounds, currently nontender. Lower extremities with no edema.  Imaging: No results found.  Labs:  CBC:  Recent Labs  04/08/17 1515 04/13/17 0007 05/22/17 0659 06/14/17 0727  WBC 9.3 7.8 9.4 10.1  HGB 14.2 13.4 15.1* 14.4  HCT 42.3 40.8 44.9 41.9  PLT 388 312 284 327    COAGS:  Recent Labs  05/22/17 0659 06/14/17 0727  INR 0.93 0.98  APTT  --  33    BMP:  Recent Labs  04/08/17 1515 04/13/17 0007 04/25/17 1051 05/22/17 0659  NA 139 134* 136 135  K 3.8 3.3* 3.8 3.2*  CL  --  101 99 98*  CO2 25 23 26 26   GLUCOSE 85 80 84 87  BUN 16.9  12 16 10   CALCIUM 10.2 9.8 10.6* 9.9  CREATININE 0.8 0.81 0.71 0.73  GFRNONAA  --  >60  --  >60  GFRAA  --  >60  --  >60    LIVER FUNCTION TESTS:  Recent Labs  09/24/16 1010 04/08/17 1515 04/13/17 0007  BILITOT 0.77 0.62 0.9  AST 17 18 17   ALT 12 14 11*  ALKPHOS 79 63 49  PROT 7.8 7.2 6.3*  ALBUMIN 4.2 4.0 3.8    Assessment and Plan: Patient with prior history of right and left breast cancers in 2006, with recurrence in 2011, currently on letrozole; recently underwent evaluation  for possible pseudoaneurysm of the right pudendal artery with arteriography revealing what appears to be a neoplasm instead in the right gluteal subcutaneous soft tissue. She presents today for image guided biopsy of this area for further evaluation.Risks and benefits discussed with the patient including, but not limited to bleeding, infection, damage to adjacent structures or low yield requiring additional tests.All of the patient's questions were answered, patient is agreeable to proceed.Consent signed and in chart.     Electronically Signed: D. Rowe Robert, PA-C 06/14/2017, 8:39 AM   I spent a total of 20 minutes  at the the patient's bedside AND on the patient's hospital floor or unit, greater than 50% of which was counseling/coordinating care for image guided biopsy of right gluteal subcutaneous mass

## 2017-06-14 NOTE — Discharge Instructions (Signed)

## 2017-06-14 NOTE — Progress Notes (Addendum)
error 

## 2017-06-14 NOTE — Procedures (Signed)
US guided FNA and core biopsies of a superficial solid mass in medial buttocks, perianal area.  4 cores and 3 FNAs.  Minimal blood loss and no immediate complication.

## 2017-06-14 NOTE — Sedation Documentation (Signed)
Patient is resting comfortably. 

## 2017-06-16 ENCOUNTER — Telehealth: Payer: Self-pay | Admitting: Hematology

## 2017-06-16 NOTE — Telephone Encounter (Signed)
Lvm advising md appt 7/23 has been moved to 7/16 @ 3.30 per 6/29 scheduling msg from md.

## 2017-06-17 ENCOUNTER — Telehealth: Payer: Self-pay

## 2017-06-18 ENCOUNTER — Telehealth: Payer: Self-pay | Admitting: Internal Medicine

## 2017-06-18 NOTE — Telephone Encounter (Signed)
Pt says that she had a procedure at [facility] and would like to have results    Please call back at :    [phone]

## 2017-06-18 NOTE — Telephone Encounter (Signed)
Procedure done by Dr. Burr Medico (oncology) she will need to call their office for results.

## 2017-06-19 NOTE — Telephone Encounter (Signed)
Called pt to make her aware. She expresses understanding.

## 2017-06-20 ENCOUNTER — Ambulatory Visit: Payer: Medicare Other | Admitting: Internal Medicine

## 2017-06-20 ENCOUNTER — Ambulatory Visit (INDEPENDENT_AMBULATORY_CARE_PROVIDER_SITE_OTHER): Payer: Medicare Other | Admitting: Internal Medicine

## 2017-06-20 ENCOUNTER — Encounter: Payer: Self-pay | Admitting: Internal Medicine

## 2017-06-20 VITALS — BP 110/60 | HR 93 | Ht 63.0 in | Wt 147.2 lb

## 2017-06-20 DIAGNOSIS — J45901 Unspecified asthma with (acute) exacerbation: Secondary | ICD-10-CM | POA: Diagnosis not present

## 2017-06-20 DIAGNOSIS — J4541 Moderate persistent asthma with (acute) exacerbation: Secondary | ICD-10-CM | POA: Diagnosis not present

## 2017-06-20 DIAGNOSIS — G4733 Obstructive sleep apnea (adult) (pediatric): Secondary | ICD-10-CM

## 2017-06-20 MED ORDER — METHYLPREDNISOLONE ACETATE 80 MG/ML IJ SUSP
80.0000 mg | Freq: Once | INTRAMUSCULAR | Status: AC
  Administered 2017-06-20: 80 mg via INTRAMUSCULAR

## 2017-06-20 MED ORDER — ALBUTEROL SULFATE HFA 108 (90 BASE) MCG/ACT IN AERS: INHALATION_SPRAY | RESPIRATORY_TRACT | 12 refills | Status: DC

## 2017-06-20 MED ORDER — LEVALBUTEROL HCL 0.63 MG/3ML IN NEBU
0.6300 mg | INHALATION_SOLUTION | Freq: Once | RESPIRATORY_TRACT | Status: AC
  Administered 2017-06-20: 0.63 mg via RESPIRATORY_TRACT

## 2017-06-20 NOTE — Patient Instructions (Addendum)
Order- neb xop 0.63    Dx exacerbation asthma  Depo 80  Script printed refilling ventolin rescue inhaler to hold  Ok to try otc Zzquil as an occasional aid for insomnia

## 2017-06-20 NOTE — Progress Notes (Signed)
[facility] Follow Up Visit  [name] [id number] [birth year] 76 y.o. 06/24/2017 11:41 PM   Principle Diagnosis:[name] 76 y.o. 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     Prior Therapy: 1. Patient underwent a screening mammogram and abnormalities were detected in her breasts. Biopsy was positive for invasive cancer. She underwent a bilateral mastectomy on 01/30/05 by Dr. Excell Seltzer. 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   2. Patient later had recurrence along the left mastectomy site of invasive ductal carcinoma, lymph nodes were negative and it was excised on 04/18/10 with negative margins, she underwent radiation therapy, followed by adjuvant Letrozole   Current therapy:  Letrozole 2.73m daily since 06/2010  Interim History: Joanne Nurse89y.o. female with h/o invasive ductal carcinoma who is here today for f/u and discussed her recent PET scan and biopsy results.. She denies any troubles with Letrozole. She has noticed slight weight loss over the past couple of months. She occasionally eats out more. She has not taken metformin for the past 2 days and is wondering if she can come off now. She denies any constipation.     Past Medical History:  Diagnosis Date  . Allergic rhinitis   . Asthma    dx in adulthood ((76y/o) PFT 09/08/09 FEV1 1.70/81%; FEV1/FVC 0.75; +resp to dilartor; NI LV/DLCO  . Breast CA ([name]    hx of bilateral diagnosied in 2006, recurred 2011- [name] . Chronic constipation   . Colonic polyp   . COPD (chronic obstructive pulmonary disease) ([name]   . Diabetes mellitus   . Dislocation of right elbow 2016  . HTN (hypertension) 11-2011  . Hyperlipidemia   . OSA (obstructive  sleep apnea)    CPAP intolerant  . Osteopenia   . Peripheral neuropathy   . 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 Urinary retention    During massive constipation (pelvic exam, suspect 8cm or more in diameter)   Medications:  Current Outpatient Prescriptions  Medication Sig Dispense Refill  . albuterol (VENTOLIN HFA) 108 (90 Base) MCG/ACT inhaler INHALE 2 PUFFS INTO THE LUNGS EVERY 4 HOURS AS NEEDED FOR WHEEZING OR SHORTNESS OF BREATH 18 g 12  . aspirin EC 81 MG tablet Take 81 mg by mouth daily.    .Marland KitchenEPINEPHrine (EPIPEN 2-PAK) 0.3 mg/0.3 mL IJ SOAJ injection Inject 0.3 mLs (0.3 mg total) into the muscle once. 2 Device 1  . fexofenadine (ALLEGRA) 180 MG tablet Take 180 mg by mouth daily.     . fluticasone furoate-vilanterol (BREO ELLIPTA) 100-25 MCG/INH AEPB Inhale 1 puff then rinse mouth, once daily 60 each 11  . glucose blood (ONE TOUCH ULTRA TEST) test strip Check blood sugars daily. 100 each 12  . letrozole (FEMARA) 2.5 MG tablet Take 1 tablet (2.5 mg total) by mouth daily. 90 tablet 1  . levalbuterol (XOPENEX) 0.63 MG/3ML nebulizer solution Take 0.63 mg by nebulization every 4 (four) hours as needed for wheezing or shortness of breath. Reported on 01/11/2016    . Multiple Vitamin (MULTIVITAMIN WITH MINERALS) TABS tablet Take 1 tablet by mouth daily.    . ONE TOUCH LANCETS MISC Use as directed once daily to check blood sugar.  DX E11.9 200 each 2  . polyethylene glycol (MIRALAX) packet Take  17 g by mouth daily. 14 each 0  . simvastatin (ZOCOR) 40 MG tablet Take 1 tablet (40 mg total) by mouth at bedtime. 90 tablet 1  . triamterene-hydrochlorothiazide (MAXZIDE-25) 37.5-25 MG tablet Take 1 tablet by mouth daily. 90 tablet 1  . metFORMIN (GLUCOPHAGE) 1000 MG tablet Take 1 tablet (1,000 mg total) by mouth daily. (Patient not taking: Reported on 06/24/2017) 90 tablet 1   No current facility-administered medications for this visit.      Allergies:  Allergies  Allergen Reactions  .  Peanut-Containing Drug Products Swelling  . Penicillins Itching    outer rectal itching    Medical History: Past Medical History:  Diagnosis Date  . Allergic rhinitis   . Asthma    dx in adulthood (76 y/o) PFT 09/08/09 FEV1 1.70/81%; FEV1/FVC 0.75; +resp to dilartor; NI LV/DLCO  . Breast CA ([name])    hx of bilateral diagnosied in 2006, recurred 2011- [name]  . Chronic constipation   . Colonic polyp   . COPD (chronic obstructive pulmonary disease) ([name])   . Diabetes mellitus   . Dislocation of right elbow 2016  . HTN (hypertension) 11-2011  . Hyperlipidemia   . OSA (obstructive sleep apnea)    CPAP intolerant  . Osteopenia   . Peripheral neuropathy   . 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 Urinary retention    During massive constipation (pelvic exam, suspect 8cm or more in diameter)    Surgical History:  Past Surgical History:  Procedure Laterality Date  . ANAL RECTAL MANOMETRY N/A 04/23/2016   Procedure: ANO RECTAL MANOMETRY;  Surgeon: [name], MD;  Location: [facility] ENDOSCOPY;  Service: Endoscopy;  Laterality: N/A;  . APPENDECTOMY     08/2004  . cyct on spine      1967  . ELBOW SURGERY Right 2016   DISLOCATION  . EXPLORATORY LAPAROTOMY    . FLEXIBLE SIGMOIDOSCOPY N/A 01/31/2013   Procedure: FLEXIBLE SIGMOIDOSCOPY;  Surgeon: [name], MD;  Location: [facility] ENDOSCOPY;  Service: Endoscopy;  Laterality: N/A;  unprepped with sedation  . IR ANGIOGRAM PELVIS SELECTIVE OR SUPRASELECTIVE  05/22/2017  . IR ANGIOGRAM SELECTIVE EACH ADDITIONAL VESSEL  05/22/2017  . IR RADIOLOGIST EVAL & MGMT  04/23/2017  . IR US GUIDE BX ASP/DRAIN  06/14/2017  . IR US GUIDE VASC ACCESS LEFT  05/22/2017  . lypoma removed from back-benign 2005     2005  . MASTECTOMY  2006   bilateral  . MASTECTOMY    . Pilonidal Cyst Surgery     ? of  . removal of boils     1970/1980    Review of Systems:  A 10 point review of systems was conducted and is otherwise negative  except for what is noted above.    HEALTH MAINTENANCE:  Mammogram 05/06/12, s/p bialteral mastectomy Colonoscopy 12/10/04, flex sig on 01/2013  Bone Density 05/28/2016: Osteopenia, T score -1.5 at total left hip, slightly improved compared to 01/29/2014. Her estimated 10 year probability of major osteoporotic fracture 6.3% and hip fracture 0.7%   Physical Exam:  Blood pressure 132/80, pulse 77, temperature 98.6 F (37 C), temperature source Oral, resp. rate 18, height '5\' 3"'  (1.6 m), weight 142 lb 12.8 oz (64.8 kg), SpO2 100 %. GENERAL: Patient is a well appearing female in no acute distress HEENT:  Sclerae anicteric.  Oropharynx clear and moist. No ulcerations or evidence of oropharyngeal candidiasis. Neck is supple.  NODES:  No cervical, supraclavicular, or axillary lymphadenopathy palpated.  BREAST  EXAM: s/p bilateral mastectomy, no nodularity on chest wall, no sign of recurrence. LUNGS:  Clear to auscultation bilaterally.  No wheezes or rhonchi. HEART:  Regular rate and rhythm. No murmur appreciated. ABDOMEN:  (+)  no palpable mass or organomegaly, LLQ tender to palpation; this is likely due to constipation MSK:  No focal spinal tenderness to palpation. She did have difficulty with range of motion in her left shoulder.  Full range of motion in right upper extremity. EXTREMITIES:  No peripheral edema.   SKIN:  Clear with no obvious rashes or skin changes. No nail dyscrasia. NEURO:  Nonfocal. Well oriented.  Appropriate affect. ECOG PERFORMANCE STATUS: 1 - Symptomatic but completely ambulatory   Lab Results: CBC Latest Ref Rng & Units 06/14/2017 05/22/2017 04/13/2017  WBC 4.0 - 10.5 K/uL 10.1 9.4 7.8  Hemoglobin 12.0 - 15.0 g/dL 14.4 15.1(H) 13.4  Hematocrit 36.0 - 46.0 % 41.9 44.9 40.8  Platelets 150 - 400 K/uL 327 284 312    CMP Latest Ref Rng & Units 05/22/2017 04/25/2017 04/13/2017  Glucose 65 - 99 mg/dL 87 84 80  BUN 6 - 20 mg/dL '10 16 12  ' Creatinine 0.44 - 1.00 mg/dL 0.73 0.71 0.81   Sodium 135 - 145 mmol/L 135 136 134(L)  Potassium 3.5 - 5.1 mmol/L 3.2(L) 3.8 3.3(L)  Chloride 101 - 111 mmol/L 98(L) 99 101  CO2 22 - 32 mmol/L '26 26 23  ' Calcium 8.9 - 10.3 mg/dL 9.9 10.6(H) 9.8  Total Protein 6.5 - 8.1 g/dL - - 6.3(L)  Total Bilirubin 0.3 - 1.2 mg/dL - - 0.9  Alkaline Phos 38 - 126 U/L - - 49  AST 15 - 41 U/L - - 17  ALT 14 - 54 U/L - - 11(L)   PATH RESULTS: Diagnosis 06/14/2017 Soft tissue, biopsy, right perianal/buttocks - FINDINGS CONSISTENT WITH SOLITARY FIBROUS TUMOR, SEE COMMENT. Microscopic Comment There is a bland spindle cell proliferation with a haphazard pattern and numerous vessels. There is no significant atypia or necrosis. Immunohistochemistry reveals the spindle cells are positive for CD34 and bcl-2. CD34 and CD31 highlight vessels. SMA, desmin, HHV-8, and CD99 are negative. 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 Dr. Lyndon Code has reviewed the case. The case was called to Dr. Burr Medico on 06/19/2017.  RADIOGRAPHIC:  CT AP 04/04/17 IMPRESSION: 1. Rectal impaction with stool and gas retention throughout the dilated colon, also seen on multiple prior studies. 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 2. 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 This may be amenable to percutaneous treatment and the patient could be evaluated in the IR Clinic. 3. Cholelithiasis.  PET 06/21/2017 IMPRESSION: 1. Mild metabolic activity associated the perianal thickening and gluteal nodularity is favored inflammatory rather than malignant. (Correlate with pathology from biopsy, unavailable) 2. No evidence of recurrent breast cancer on the at skullbase to thigh FDG scan. 3. Small pulmonary nodules are below the size for PET characterization. Favor benign. If Patient has smoking history recommend follow-up CT in 12 months.  Assessment and Plan: [name] 76 y.o. female with  1. Right perianal fibrous tumor -We discussed her  biopsy results, which showed fibrous tumor -We discussed this is a benign tumor, surgical resection is usually preferred, if it is feasible. -I'll discuss her case in our GI tumor Board this week, to see if colorectal surgeon recommends surgical resection. If no surgery, will monitor clinically.  2.  ER/PR positive invasive mammary carcinoma in left breast in 2006 with  local recurrence in 2011.   -She is status post bilateral mastectomy, on adjuvant letrozole -She is tolerating she is all very well, without significant side effects. -Giving her local recurrent disease, I previously discussed extending her letrozole for longer period of time, likely 10 years. She agreed. She has been on it since August 2011. -I previously refilled letrozole -She previously developed abdominal bloating and constipation lately, with weight loss. She underwent CT of abdomen and pelvis which was unremarkable . No concerns of cancer recurrence. -Due to her recent right perianal lesion, she underwent a PET scan on 06/21/2017, which showed mild hypermetabolism in the right perianal mass (biopsy confirmed fibrous tumor), no other evidence of breast cancer recurrence or metastasis. -We'll continue letrozole.  2. Hypercalcemia -She will follow-up with her primary care physician -She knows to drink fluids adequately and avoid dehydration -Avoid calcium supplement -PTH was normal in 09/2014 -continue monitoring  3. Osteopenia -she is on multivitamin  -She is not qualified for biphosphonate or Prolia due to her risk of fracture   -Continue bone density scan every 2 years, she is due, I'll set up for her at disorders.  4. Hypertension, diabetes, COPD -She'll continue follow-up with primary care physician  5. Weight Loss -Patient reports gradual loss of weight. She has lost 7 pounds in the last 3 months.  -Has experienced more sever constipation and has lowered dose of amitiza dose from 2 to 1 pills  -I suggest adding  ensure to her diet to supplement nutrition  PLAN: -PET scan and recent biopsy results reviewed -continue letrozole -f/u in 6 months with labs and evaluaiton -I'll discuss her case in our GI tumor Board this week, to see if she surgical resection for her perianal fibrous tumor. I will call her after the conference   She knows to call us in the interim for any questions or concerns. We can certainly see her sooner if needed.  I spent 25 minutes counseling the patient face to face.  The total time spent in the appointment was 30 minutes.  This document serves as a record of services personally performed by Truitt Merle, MD. It was created on her behalf by Brandt Loosen, a trained medical scribe. The creation of this record is based on the scribe's personal observations and the provider's statements to them. This document has been checked and approved by the attending provider.   Truitt Merle  06/24/2017
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
## 2017-06-20 NOTE — Assessment & Plan Note (Signed)
She is describing baseline mild intermittent chronic asthmatic bronchitis now with recent acute exacerbation that might be viral. Plan-nebulizer treatments Xopenex, Depo-Medrol, refilled Ventolin, discussed Breo

## 2017-06-20 NOTE — Assessment & Plan Note (Signed)
She has lost weight since her sleep studies. Never tolerated CPAP and now not aware of snoring.

## 2017-06-21 ENCOUNTER — Ambulatory Visit (HOSPITAL_COMMUNITY)
Admission: RE | Admit: 2017-06-21 | Discharge: 2017-06-21 | Disposition: A | Discharge status: Home / Self Care | Payer: Medicare Other | Source: Ambulatory Visit | Attending: Adult Health | Admitting: Adult Health

## 2017-06-21 ENCOUNTER — Telehealth: Payer: Self-pay | Admitting: *Deleted

## 2017-06-21 DIAGNOSIS — C50912 Malignant neoplasm of unspecified site of left female breast: Secondary | ICD-10-CM

## 2017-06-21 DIAGNOSIS — Z9012 Acquired absence of left breast and nipple: Secondary | ICD-10-CM | POA: Insufficient documentation

## 2017-06-21 DIAGNOSIS — Z08 Encounter for follow-up examination after completed treatment for malignant neoplasm: Secondary | ICD-10-CM | POA: Diagnosis not present

## 2017-06-21 DIAGNOSIS — I729 Aneurysm of unspecified site: Secondary | ICD-10-CM | POA: Diagnosis present

## 2017-06-21 DIAGNOSIS — R918 Other nonspecific abnormal finding of lung field: Secondary | ICD-10-CM | POA: Diagnosis not present

## 2017-06-21 DIAGNOSIS — Z9011 Acquired absence of right breast and nipple: Secondary | ICD-10-CM | POA: Diagnosis not present

## 2017-06-21 DIAGNOSIS — Z853 Personal history of malignant neoplasm of breast: Secondary | ICD-10-CM | POA: Diagnosis present

## 2017-06-21 LAB — GLUCOSE, CAPILLARY: GLUCOSE-CAPILLARY: 82 mg/dL (ref 65–99)

## 2017-06-21 MED ORDER — FLUDEOXYGLUCOSE F - 18 (FDG) INJECTION
7.7000 | Freq: Once | INTRAVENOUS | Status: AC | PRN
  Administered 2017-06-21: 7.7 via INTRAVENOUS

## 2017-06-21 NOTE — Telephone Encounter (Signed)
Spoke with pt and informed of biopsy results :  Benign as per Dr. Burr Medico.  Informed pt that Dr. Burr Medico will discuss more in details with pt at office visit on Monday  06/24/17.  Pt voiced understanding.

## 2017-06-24 ENCOUNTER — Encounter: Payer: Self-pay | Admitting: Hematology

## 2017-06-24 ENCOUNTER — Ambulatory Visit (HOSPITAL_BASED_OUTPATIENT_CLINIC_OR_DEPARTMENT_OTHER): Payer: Medicare Other | Admitting: Hematology

## 2017-06-24 ENCOUNTER — Telehealth: Payer: Self-pay | Admitting: *Deleted

## 2017-06-24 ENCOUNTER — Telehealth: Payer: Self-pay | Admitting: Hematology

## 2017-06-24 VITALS — BP 132/80 | HR 77 | Temp 98.6°F | Resp 18 | Ht 63.0 in | Wt 142.8 lb

## 2017-06-24 DIAGNOSIS — Z79811 Long term (current) use of aromatase inhibitors: Secondary | ICD-10-CM | POA: Diagnosis not present

## 2017-06-24 DIAGNOSIS — K5904 Chronic idiopathic constipation: Secondary | ICD-10-CM

## 2017-06-24 DIAGNOSIS — Z923 Personal history of irradiation: Secondary | ICD-10-CM | POA: Diagnosis not present

## 2017-06-24 DIAGNOSIS — Z853 Personal history of malignant neoplasm of breast: Secondary | ICD-10-CM

## 2017-06-24 DIAGNOSIS — C50912 Malignant neoplasm of unspecified site of left female breast: Secondary | ICD-10-CM

## 2017-06-24 DIAGNOSIS — Z170 Estrogen receptor positive status [ER+]: Secondary | ICD-10-CM

## 2017-06-24 DIAGNOSIS — Z86000 Personal history of in-situ neoplasm of breast: Secondary | ICD-10-CM

## 2017-06-24 DIAGNOSIS — Z9013 Acquired absence of bilateral breasts and nipples: Secondary | ICD-10-CM | POA: Diagnosis not present

## 2017-06-24 DIAGNOSIS — D215 Benign neoplasm of connective and other soft tissue of pelvis: Secondary | ICD-10-CM | POA: Diagnosis not present

## 2017-06-24 DIAGNOSIS — C50812 Malignant neoplasm of overlapping sites of left female breast: Secondary | ICD-10-CM

## 2017-06-24 DIAGNOSIS — E1142 Type 2 diabetes mellitus with diabetic polyneuropathy: Secondary | ICD-10-CM

## 2017-06-24 NOTE — Telephone Encounter (Signed)
Call from pt to confirm appt time.

## 2017-06-24 NOTE — Telephone Encounter (Signed)
Scheduled appt per 7/16 los - Gave patient AVS and calender per los.  

## 2017-06-25 ENCOUNTER — Ambulatory Visit: Payer: Medicare Other

## 2017-06-26 ENCOUNTER — Other Ambulatory Visit: Payer: Medicare Other

## 2017-06-26 ENCOUNTER — Ambulatory Visit
Admission: RE | Admit: 2017-06-26 | Discharge: 2017-06-26 | Disposition: A | Discharge status: Home / Self Care | Payer: Medicare Other | Source: Ambulatory Visit | Attending: Interventional Radiology | Admitting: Interventional Radiology

## 2017-06-26 ENCOUNTER — Inpatient Hospital Stay: Admission: RE | Admit: 2017-06-26 | Payer: Medicare Other | Source: Ambulatory Visit

## 2017-06-26 DIAGNOSIS — I729 Aneurysm of unspecified site: Secondary | ICD-10-CM

## 2017-06-26 HISTORY — PX: IR RADIOLOGIST EVAL & MGMT: IMG5224

## 2017-06-26 NOTE — Progress Notes (Signed)
Patient ID: [name], female   DOB: [birth year], 76 y.o.   MRN: [id number]       Chief Complaint: Patient was seen in consultation today for  Chief Complaint  Patient presents with  . Follow-up    Pseudoaneurysm of Pudenal Artery   at the request of Drexel Ivey  Referring Physician(s): Mande Auvil  History of Present Illness: [name] is a 76 y.o. female with a history of breast cancer and constipation who presented for right pudendal artery aneurysm embolization. After performing an angiogram, it was evident that the abnormality was actually a hyperenhancing neoplasm. Subsequent biopsy proved that it is a benign fibrous lesion. She returns for reevaluation after her angiogram. She denies any symptoms in the left groin at her angiogram site. She also denies any symptoms related to the fibrous tumor in her right gluteal region.  Past Medical History:  Diagnosis Date  . Allergic rhinitis   . Asthma    dx in adulthood (76 y/o) PFT 09/08/09 FEV1 1.70/81%; FEV1/FVC 0.75; +resp to dilartor; NI LV/DLCO  . Breast CA ([place])    hx of bilateral diagnosied in 2006, recurred 2011- [name]  . Chronic constipation   . Colonic polyp   . COPD (chronic obstructive pulmonary disease) ([place])   . Diabetes mellitus   . Dislocation of right elbow 2016  . HTN (hypertension) 11-2011  . Hyperlipidemia   . OSA (obstructive sleep apnea)    CPAP intolerant  . Osteopenia   . Peripheral neuropathy   . 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 Urinary retention    During massive constipation (pelvic exam, suspect 8cm or more in diameter)    Past Surgical History:  Procedure Laterality Date  . ANAL RECTAL MANOMETRY N/A 04/23/2016   Procedure: ANO RECTAL MANOMETRY;  Surgeon: [name], MD;  Location: [facility] ENDOSCOPY;  Service: Endoscopy;  Laterality: N/A;  . APPENDECTOMY     08/2004  . cyct on spine      1967  . ELBOW SURGERY Right 2016   DISLOCATION  .  EXPLORATORY LAPAROTOMY    . FLEXIBLE SIGMOIDOSCOPY N/A 01/31/2013   Procedure: FLEXIBLE SIGMOIDOSCOPY;  Surgeon: [name], MD;  Location: [facility] ENDOSCOPY;  Service: Endoscopy;  Laterality: N/A;  unprepped with sedation  . IR ANGIOGRAM PELVIS SELECTIVE OR SUPRASELECTIVE  05/22/2017  . IR ANGIOGRAM SELECTIVE EACH ADDITIONAL VESSEL  05/22/2017  . IR RADIOLOGIST EVAL & MGMT  04/23/2017  . IR US GUIDE BX ASP/DRAIN  06/14/2017  . IR US GUIDE VASC ACCESS LEFT  05/22/2017  . lypoma removed from back-benign 2005     2005  . MASTECTOMY  2006   bilateral  . MASTECTOMY    . Pilonidal Cyst Surgery     ? of  . removal of boils     1970/1980    Allergies: Peanut-containing drug products and Penicillins  Medications: Prior to Admission medications   Medication Sig Start Date End Date Taking? Authorizing Provider  albuterol (VENTOLIN HFA) 108 (90 Base) MCG/ACT inhaler INHALE 2 PUFFS INTO THE LUNGS EVERY 4 HOURS AS NEEDED FOR WHEEZING OR SHORTNESS OF BREATH 06/20/17   Baird Lyons D, MD  aspirin EC 81 MG tablet Take 81 mg by mouth daily.    [provider]  EPINEPHrine (EPIPEN 2-PAK) 0.3 mg/0.3 mL IJ SOAJ injection Inject 0.3 mLs (0.3 mg total) into the muscle once. 04/11/15   Colon Branch, MD  fexofenadine (ALLEGRA) 180 MG tablet Take 180 mg by mouth daily.  [provider]  fluticasone furoate-vilanterol (BREO ELLIPTA) 100-25 MCG/INH AEPB Inhale 1 puff then rinse mouth, once daily 01/11/17   Young, Tarri Fuller D, MD  glucose blood (ONE TOUCH ULTRA TEST) test strip Check blood sugars daily. 05/24/17   Colon Branch, MD  letrozole [facility]) 2.5 MG tablet Take 1 tablet (2.5 mg total) by mouth daily. 04/08/17   Truitt Merle, MD  levalbuterol [facility]) 0.63 MG/3ML nebulizer solution Take 0.63 mg by nebulization every 4 (four) hours as needed for wheezing or shortness of breath. Reported on 01/11/2016    [provider]  metFORMIN (GLUCOPHAGE) 1000 MG tablet Take 1 tablet (1,000 mg total) by  mouth daily. Patient not taking: Reported on 06/24/2017 06/11/17   Colon Branch, MD  Multiple Vitamin (MULTIVITAMIN WITH MINERALS) TABS tablet Take 1 tablet by mouth daily.    [provider]  ONE TOUCH LANCETS MISC Use as directed once daily to check blood sugar.  DX E11.9 04/12/16   Colon Branch, MD  polyethylene glycol [facility]) packet Take 17 g by mouth daily. 04/13/17   Rancour, Annie Main, MD  simvastatin (ZOCOR) 40 MG tablet Take 1 tablet (40 mg total) by mouth at bedtime. 02/19/17   Colon Branch, MD  triamterene-hydrochlorothiazide (MAXZIDE-25) 37.5-25 MG tablet Take 1 tablet by mouth daily. 01/04/17   Colon Branch, MD     Family History  Problem Relation Age of Onset  . COPD Mother        smoker  . Hypertension Mother   . Lung cancer Father        died age 75  . Breast cancer Sister        GM,sister  . Diabetes Brother   . Cancer Maternal Grandmother        breast   . Colon cancer Neg Hx   . Heart attack Neg Hx        no h/o early diseae    Social History   Social History  . Marital status: Widowed    Spouse name: N/A  . Number of children: 2  . Years of education: N/A   Occupational History  . retired Retired   Social History Main Topics  . Smoking status: Former Smoker    Packs/day: 1.00    Years: 4.00    Types: Cigarettes    Quit date: 04/01/1968  . Smokeless tobacco: Former Systems developer    Quit date: 12/11/1967     Comment: Quit at age 89  . Alcohol use No  . Drug use: No  . Sexual activity: Not Currently   Other Topics Concern  . Not on file   Social History Narrative   Widowed last husband 4-10, lives alone, retired Pharmacist, hospital   2 children    [place] twins       Review of Systems: A 12 point ROS discussed and pertinent positives are indicated in the HPI above.  All other systems are negative.  Review of Systems  Vital Signs: BP 121/86   Pulse 78   Temp 99.2 F (37.3 C) (Oral)   Resp 15   Ht 5' 3.5" (1.613 m)   Wt 142 lb (64.4 kg)   SpO2 97%   BMI 24.76  kg/m   Physical Exam  Constitutional: She is oriented to person, place, and time. She appears well-developed and well-nourished.  Cardiovascular:  Left groin is clean and dry without signs of aneurysm, pseudoaneurysm, or hematoma. Pulses are intact distally.  Neurological: She is alert and oriented  to person, place, and time.     Imaging: Nm Pet Image Restag (ps) Skull Base To Thigh  Result Date: 06/21/2017 CLINICAL DATA:  Subsequent treatment strategy for breast carcinoma. EXAM: NUCLEAR MEDICINE PET SKULL BASE TO THIGH TECHNIQUE: 7.7 mCi F-18 FDG was injected intravenously. Full-ring PET imaging was performed from the skull base to thigh after the radiotracer. CT data was obtained and used for attenuation correction and anatomic localization. FASTING BLOOD GLUCOSE:  Value: 82 mg/dl COMPARISON:  PET-CT 04/12/2010, CT 04/13/2017. FINDINGS: NECK No hypermetabolic lymph nodes in the neck. CHEST Several small scattered pulmonary nodules which are below the size for PET characterization. One nodule RIGHT lower lobe measuring 5 mm (image 45, series 7) unchanged from prior. RIGHT upper lobe lesion measuring 3 mm not identified on comparison exam. (Image 25, series 7). No hypermetabolic mediastinal lymph nodes. No hypermetabolic axial lymph nodes. Bilateral mastectomy anatomy. ABDOMEN/PELVIS No abnormal metabolic activity liver. No hypermetabolic abdominopelvic lymph nodes. There is or rim of peritoneal thickening as well as a nodule within the medial aspect of the RIGHT gluteal region. These nodularity and perianal thickening have of mild activity (SUV max equal 2.5) not indicative of malignancy. SKELETON No focal hypermetabolic activity to suggest skeletal metastasis. IMPRESSION: 1. Mild metabolic activity associated the perianal thickening and gluteal nodularity is favored inflammatory rather than malignant. (Correlate with pathology from biopsy, unavailable) 2. No evidence of recurrent breast cancer on the  at skullbase to thigh FDG scan. 3. Small pulmonary nodules are below the size for PET characterization. Favor benign. If Patient has smoking history recommend follow-up CT in 12 months. Electronically Signed   By: Suzy Bouchard M.D.   On: 06/21/2017 10:16   Ir US Guide Bx Asp/drain  Result Date: 06/14/2017 CLINICAL DATA:  76 year old with a lesion in the medial right buttock/perianal region. Review of old imaging suggests that this has been slowly enlarging. This structure is hypervascular based on angiography and postcontrast images. Tissue diagnosis is needed. EXAM: ULTRASOUND-GUIDED BIOPSY OF THE RIGHT PERIANAL/ BUTTOCK SUBCUTANEOUS LESION Physician: Stephan Minister. Anselm Pancoast, MD FLUOROSCOPY TIME:  None MEDICATIONS: 2 mg Versed, 75 mcg fentanyl. A radiology Snyder monitored the patient for moderate sedation. ANESTHESIA/SEDATION: Moderate sedation time: 25 minutes PROCEDURE: The procedure was explained to the patient. The risks and benefits of the procedure were discussed and the patient's questions were addressed. Informed consent was obtained from the patient. The area of concern was evaluated with ultrasound. The lesion is actually palpable in the medial buttock/perianal region. Lesion is superficial in the subcutaneous tissues. This area was prepped with chlorhexidine and a sterile field was created. Skin was anesthetized with 1% lidocaine. Using ultrasound guidance, 3 fine-needle aspirations were obtained with [place] needles. A 17 gauge coaxial needle was then directed into the lesion with ultrasound guidance and 4 core biopsies obtained with an 18 gauge core device. Minimal bleeding from the coaxial needle. No significant bleeding around the lesion based on ultrasound. 17 gauge coaxial needle was removed without complication. Bandage placed over the puncture site. FINDINGS: There is a solid hypoechoic lesion in the subcutaneous tissue of the medial right buttock. 3 fine-needle aspirations and 4 core biopsies  were obtained of this lesion. COMPLICATIONS: None IMPRESSION: Successful ultrasound-guided biopsies of the right buttock lesion. Electronically Signed   By: Markus Daft M.D.   On: 06/14/2017 11:41    Labs:  CBC:  Recent Labs  04/08/17 1515 04/13/17 0007 05/22/17 0659 06/14/17 0727  WBC 9.3 7.8 9.4 10.1  HGB  14.2 13.4 15.1* 14.4  HCT 42.3 40.8 44.9 41.9  PLT 388 312 284 327    COAGS:  Recent Labs  05/22/17 0659 06/14/17 0727  INR 0.93 0.98  APTT  --  33    BMP:  Recent Labs  04/08/17 1515 04/13/17 0007 04/25/17 1051 05/22/17 0659  NA 139 134* 136 135  K 3.8 3.3* 3.8 3.2*  CL  --  101 99 98*  CO2 25 23 26 26   GLUCOSE 85 80 84 87  BUN 16.9 12 16 10   CALCIUM 10.2 9.8 10.6* 9.9  CREATININE 0.8 0.81 0.71 0.73  GFRNONAA  --  >60  --  >60  GFRAA  --  >60  --  >60    LIVER FUNCTION TESTS:  Recent Labs  09/24/16 1010 04/08/17 1515 04/13/17 0007  BILITOT 0.77 0.62 0.9  AST 17 18 17   ALT 12 14 11*  ALKPHOS 79 63 49  PROT 7.8 7.2 6.3*  ALBUMIN 4.2 4.0 3.8    TUMOR MARKERS: No results for input(s): AFPTM, CEA, CA199, CHROMGRNA in the last 8760 hours.  Assessment and Plan:  [name] has done well after left groin pelvic angiogram and gluteal mass biopsy. She has had no complications and is completely asymptomatic. Her constipation has also improved after a new laxative regimen. She was instructed to contact medical attention if the gluteal mass become symptomatic and can be referred for resection by general surgery.   Electronically Signed: Huzaifa Viney, ART A 06/26/2017, 3:03 PM   I spent a total of   10 Minutes in face to face in clinical consultation, greater than 50% of which was counseling/coordinating care for pelvic angiography.

## 2017-07-01 ENCOUNTER — Ambulatory Visit: Payer: Medicare Other | Admitting: Hematology

## 2017-07-12 ENCOUNTER — Telehealth: Payer: Self-pay | Admitting: *Deleted

## 2017-07-12 NOTE — Telephone Encounter (Signed)
Received vm call from pt stating that Dr Burr Medico was to call her to let her know what the plan of care is.  She request call back to [phone].  Message left for Dr Burr Medico.

## 2017-09-23 ENCOUNTER — Encounter: Payer: Self-pay | Admitting: Interventional Radiology

## 2017-10-01 IMAGING — CT NM PET TUM IMG RESTAG (PS) SKULL BASE T - THIGH
1 of 7 series · 1 of 25 positions shown · non-contrast
Comparison: PET-CT 04/12/2010, CT 04/13/2017.

CLINICAL DATA: Subsequent treatment strategy for breast carcinoma.

EXAM:
NUCLEAR MEDICINE PET SKULL BASE TO THIGH
TECHNIQUE: 7.7 mCi F-18 FDG was injected intravenously. Full-ring PET imaging
was performed from the skull base to thigh after the radiotracer. CT
data was obtained and used for attenuation correction and anatomic
localization.
FASTING BLOOD GLUCOSE:  Value: 82 mg/dl

[Series 4: ct sk_thigh 5.0 b31f · axial · 5.0mm · 0.98mm/px · 1 of 207 slices shown]
[im 207/207  brain]
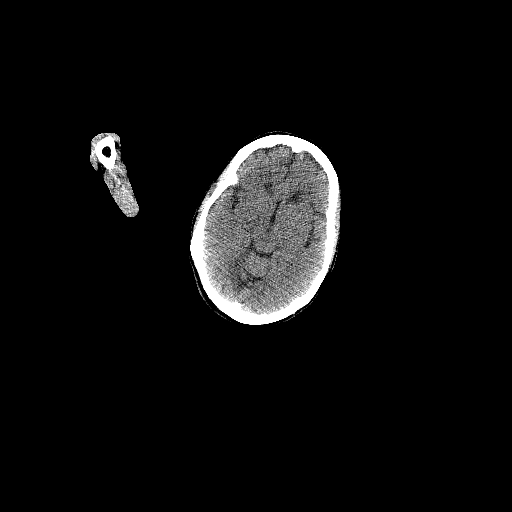

[1 of 25 positions shown; findings below may reference images not displayed]

FINDINGS: NECK

No hypermetabolic lymph nodes in the neck.

CHEST

Several small scattered pulmonary nodules which are below the size
for PET characterization. One nodule RIGHT lower lobe measuring 5 mm
(image 45, series 7) unchanged from prior. RIGHT upper lobe lesion
measuring 3 mm not identified on comparison exam. (Image 25, series
7).

No hypermetabolic mediastinal lymph nodes. No hypermetabolic axial
lymph nodes. Bilateral mastectomy anatomy.

ABDOMEN/PELVIS

No abnormal metabolic activity liver. No hypermetabolic
abdominopelvic lymph nodes.

There is or rim of peritoneal thickening as well as a nodule within
the medial aspect of the RIGHT gluteal region. These nodularity and
perianal thickening have of mild activity (SUV max equal 2.5) not
indicative of malignancy.

SKELETON

No focal hypermetabolic activity to suggest skeletal metastasis.
IMPRESSION: 1. Mild metabolic activity associated the perianal thickening and
gluteal nodularity is favored inflammatory rather than malignant.
(Correlate with pathology from biopsy, unavailable)
2. No evidence of recurrent breast cancer on the at skullbase to
thigh FDG scan.
3. Small pulmonary nodules are below the size for PET
characterization. Favor benign. If Patient has smoking history
recommend follow-up CT in 12 months.

## 2017-10-04 NOTE — Progress Notes (Signed)
[name] Follow Up Visit  [name] [id number] 23-Oct-1941 76 y.o. 10/07/2017 11:18 PM   Principle Diagnosis:[name] 76 y.o. 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     Prior Therapy: 1. Patient underwent a screening mammogram and abnormalities were detected in her breasts. Biopsy was positive for invasive cancer. She underwent a bilateral mastectomy on 01/30/05 by Dr. Excell Seltzer. 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   2. Patient later had recurrence along the left mastectomy site of invasive ductal carcinoma, lymph nodes were negative and it was excised on 04/18/10 with negative margins, she underwent radiation therapy, followed by adjuvant Letrozole   Current therapy:  Letrozole 2.35m daily since 06/2010  Interim History:  Joanne Nurse89y.o. female with h/o invasive ductal carcinoma who is here today for f/u. She presents to the clinic today noting she is still losing weight and not trying to. She says she lost 100 pounds over the past 20 years. This was gradually. Her weight was stable over the last 3 months. She is eating well. She is taking Milk of Magnesia to keep her bowels moving. She always had constipation problems. She is not getting her benign rectal mass removed.  She notes a cylindrical mass near her labia. She noticed this 1 month ago, no discharge or blood. This is the mass from her rectal mass she had biopsied.  She notes a dry cough that is now resolved. It lasted about the amount of a normal cold.  She had a bone density scan with [facility] in the recent 2 years and she thinks it was normal. She had gotten vaccination through her PCP and thinks one of them was her flu shot.     Past Medical  History:  Diagnosis Date  . Allergic rhinitis   . Asthma    dx in adulthood ((76y/o) PFT 09/08/09 FEV1 1.70/81%; FEV1/FVC 0.75; +resp to dilartor; NI LV/DLCO  . Breast CA ([place]    hx of bilateral diagnosied in 2006, recurred 2011- [name] . Chronic constipation   . Colonic polyp   . [place]   . Diabetes mellitus   . Dislocation of right elbow 2016  . HTN (hypertension) 11-2011  . Hyperlipidemia   . OSA (obstructive sleep apnea)    CPAP intolerant  . Osteopenia   . Peripheral neuropathy   . 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 Urinary retention    During massive constipation (pelvic exam, suspect 8cm or more in diameter)   Medications:  Current Outpatient Prescriptions  Medication Sig Dispense Refill  . albuterol (VENTOLIN HFA) 108 (90 Base) MCG/ACT inhaler INHALE 2 PUFFS INTO THE LUNGS EVERY 4 HOURS AS NEEDED FOR WHEEZING OR SHORTNESS OF BREATH 18 g 12  . aspirin EC 81 MG tablet Take 81 mg by mouth daily.    . fexofenadine (ALLEGRA) 180 MG tablet Take 180 mg by mouth daily.     . fluticasone furoate-vilanterol (BREO ELLIPTA) 100-25 MCG/INH AEPB Inhale 1 puff then rinse mouth, once daily 60 each 11  . glucose blood (ONE TOUCH ULTRA TEST) test strip Check blood sugars daily. 100 each 12  . letrozole (FEMARA) 2.5 MG tablet Take 1 tablet (2.5 mg total) by mouth daily. 90 tablet 1  .  levalbuterol (XOPENEX) 0.63 MG/3ML nebulizer solution Take 0.63 mg by nebulization every 4 (four) hours as needed for wheezing or shortness of breath. Reported on 01/11/2016    . metFORMIN (GLUCOPHAGE) 1000 MG tablet Take 1 tablet (1,000 mg total) by mouth daily. 90 tablet 1  . Multiple Vitamin (MULTIVITAMIN WITH MINERALS) TABS tablet Take 1 tablet by mouth daily.    . ONE TOUCH LANCETS MISC Use as directed once daily to check blood sugar.  DX E11.9 200 each 2  . polyethylene glycol (MIRALAX) packet Take 17 g by mouth daily. 14 each 0  .  simvastatin (ZOCOR) 40 MG tablet Take 1 tablet (40 mg total) by mouth at bedtime. 90 tablet 1  . triamterene-hydrochlorothiazide (MAXZIDE-25) 37.5-25 MG tablet Take 1 tablet by mouth daily. 90 tablet 1  . EPINEPHrine (EPIPEN 2-PAK) 0.3 mg/0.3 mL IJ SOAJ injection Inject 0.3 mLs (0.3 mg total) into the muscle once. (Patient not taking: Reported on 10/07/2017) 2 Device 1   No current facility-administered medications for this visit.      Allergies:  Allergies  Allergen Reactions  . Peanut-Containing Drug Products Swelling  . Penicillins Itching    outer rectal itching    Medical History: Past Medical History:  Diagnosis Date  . Allergic rhinitis   . Asthma    dx in adulthood (76 y/o) PFT 09/08/09 FEV1 1.70/81%; FEV1/FVC 0.75; +resp to dilartor; NI LV/DLCO  . Breast CA ([place])    hx of bilateral diagnosied in 2006, recurred 2011- [name]  . Chronic constipation   . Colonic polyp   . COPD (chronic obstructive pulmonary disease) ([place])   . Diabetes mellitus   . Dislocation of right elbow 2016  . HTN (hypertension) 11-2011  . Hyperlipidemia   . OSA (obstructive sleep apnea)    CPAP intolerant  . Osteopenia   . Peripheral neuropathy   . 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 Urinary retention    During massive constipation (pelvic exam, suspect 8cm or more in diameter)    Surgical History:  Past Surgical History:  Procedure Laterality Date  . ANAL RECTAL MANOMETRY N/A 04/23/2016   Procedure: ANO RECTAL MANOMETRY;  Surgeon: [name], MD;  Location: [facility] ENDOSCOPY;  Service: Endoscopy;  Laterality: N/A;  . APPENDECTOMY     08/2004  . cyct on spine      1967  . ELBOW SURGERY Right 2016   DISLOCATION  . EXPLORATORY LAPAROTOMY    . FLEXIBLE SIGMOIDOSCOPY N/A 01/31/2013   Procedure: FLEXIBLE SIGMOIDOSCOPY;  Surgeon: [name], MD;  Location: [facility] ENDOSCOPY;  Service: Endoscopy;  Laterality: N/A;  unprepped with sedation  . IR ANGIOGRAM PELVIS  SELECTIVE OR SUPRASELECTIVE  05/22/2017  . IR ANGIOGRAM SELECTIVE EACH ADDITIONAL VESSEL  05/22/2017  . IR RADIOLOGIST EVAL & MGMT  04/23/2017  . IR RADIOLOGIST EVAL & MGMT  06/26/2017  . IR US GUIDE BX ASP/DRAIN  06/14/2017  . IR US GUIDE VASC ACCESS LEFT  05/22/2017  . lypoma removed from back-benign 2005     2005  . MASTECTOMY  2006   bilateral  . MASTECTOMY    . Pilonidal Cyst Surgery     ? of  . removal of boils     1970/1980    Review of Systems:  A 10 point review of systems was conducted and is otherwise negative except for what is noted above.    HEALTH MAINTENANCE:  Mammogram 05/06/12, s/p bialteral mastectomy Colonoscopy 12/10/04, flex sig on 01/2013  Bone Density  05/28/2016: Osteopenia, T score -1.5 at total left hip, slightly improved compared to 01/29/2014. Her estimated 10 year probability of major osteoporotic fracture 6.3% and hip fracture 0.7%   Physical Exam:  Blood pressure 127/82, pulse 92, temperature (!) 97.3 F (36.3 C), temperature source Oral, resp. rate 20, height 5' 3.5" (1.613 m), weight 144 lb 12.8 oz (65.7 kg), SpO2 95 %.   GENERAL: Patient is a well appearing female in no acute distress HEENT:  Sclerae anicteric.  Oropharynx clear and moist. No ulcerations or evidence of oropharyngeal candidiasis. Neck is supple.  NODES:  No cervical, supraclavicular, or axillary lymphadenopathy palpated.  BREAST EXAM: s/p bilateral mastectomy, no nodularity on chest wall, no sign of recurrence. LUNGS:  Clear to auscultation bilaterally.  No wheezes or rhonchi. Rectum: 2.5 cm subcutaneous nodule, smooth, non-tender in the perianal area on right side between labia and anus.  HEART:  Regular rate and rhythm. No murmur appreciated. ABDOMEN:  no palpable mass or organomegaly, LLQ tender to palpation; this is likely due to constipation MSK:  No focal spinal tenderness to palpation. She did have difficulty with range of motion in her left shoulder.  Full range of motion in right  upper extremity. EXTREMITIES:  No peripheral edema.   SKIN:  Clear with no obvious rashes or skin changes. No nail dyscrasia. NEURO:  Nonfocal. Well oriented.  Appropriate affect. ECOG PERFORMANCE STATUS: 1 - Symptomatic but completely ambulatory   Lab Results: CBC Latest Ref Rng & Units 10/07/2017 06/14/2017 05/22/2017  WBC 3.9 - 10.3 10e3/uL 8.3 10.1 9.4  Hemoglobin 11.6 - 15.9 g/dL 14.4 14.4 15.1(H)  Hematocrit 34.8 - 46.6 % 42.9 41.9 44.9  Platelets 145 - 400 10e3/uL 298 327 284    CMP Latest Ref Rng & Units 10/07/2017 05/22/2017 04/25/2017  Glucose 70 - 140 mg/dl 109 87 84  BUN 7.0 - 26.0 mg/dL 13._0 Creatinine 0.6 - 1.1 mg/dL 0.9 0.73 0.71  Sodium 136 - 145 mEq/L 138 135 136  Potassium 3.5 - 5.1 mEq/L 3.5 3.2(L) 3.8  Chloride 101 - 111 mmol/L - 98(L) 99  CO2 22 - 29 mEq/L _1 Calcium 8.4 - 10.4 mg/dL 10.3 9.9 10.6(H)  Total Protein 6.4 - 8.3 g/dL 7.4 - -  Total Bilirubin 0.20 - 1.20 mg/dL 0.89 - -  Alkaline Phos 40 - 150 U/L 53 - -  AST 5 - 34 U/L 17 - -  ALT 0 - 55 U/L 12 - -   PATH RESULTS: Diagnosis 06/14/2017 Soft tissue, biopsy, right perianal/buttocks - FINDINGS CONSISTENT WITH SOLITARY FIBROUS TUMOR, SEE COMMENT. Microscopic Comment There is a bland spindle cell proliferation with a haphazard pattern and numerous vessels. There is no significant atypia or necrosis. Immunohistochemistry reveals the spindle cells are positive for CD34 and bcl-2. CD34 and CD31 highlight vessels. SMA, desmin, HHV-8, and CD99 are negative. 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 Dr. Lyndon Code has reviewed the case. The case was called to Dr. Burr Medico on 06/19/2017.  RADIOGRAPHIC:  CT AP 04/04/17 IMPRESSION: 1. Rectal impaction with stool and gas retention throughout the dilated colon, also seen on multiple prior studies. 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 2. 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 This may be amenable to percutaneous  treatment and the patient could be evaluated in the IR Clinic. 3. Cholelithiasis.  PET 06/21/2017 IMPRESSION: 1. Mild metabolic activity associated the perianal thickening and gluteal nodularity is favored inflammatory rather than malignant. (  Correlate with pathology from biopsy, unavailable) 2. No evidence of recurrent breast cancer on the at skullbase to thigh FDG scan. 3. Small pulmonary nodules are below the size for PET characterization. Favor benign. If Patient has smoking history recommend follow-up CT in 12 months.  Assessment and Plan: Gracelyn Snyder 76 y.o. female with  1. Right perianal fibrous tumor -We previously discussed her biopsy results, which showed fibrous tumor -We discussed this is a benign tumor, surgical resection is usually preferred, if it is feasible. -I discussed her case in our GI tumor Board, and a surgical resection was not recommenced at this time. -I palpated the 2.5cm perirectal mass in today's exam (10/07/17), she he is asymptomatic -I discussed that if this nodule is increasing in size or impacting her bowel movement she should contact us.  -Will monitor   2.  ER/PR positive invasive mammary carcinoma in left breast in 2006 with local recurrence in 2011.   -She is status post bilateral mastectomy, on adjuvant letrozole started in 06/2010. She is tolerating well without significant side effects. -Giving her local recurrent disease, I previously discussed extending her letrozole for longer period of time, likely 10 years. She agreed. She has been on it since August 2011. -She previously developed abdominal bloating and constipation lately, with weight loss. She underwent CT of abdomen and pelvis which was unremarkable . No concerns of cancer recurrence. -Due to her recent right perianal lesion, she underwent a PET scan on 06/21/2017, which showed mild hypermetabolism in the right perianal mass (biopsy confirmed fibrous tumor), no other evidence of  breast cancer recurrence or metastasis. -We'll continue letrozole. -She is clinically doing well, her CBC and CMP are within normal limits. Her breast exam was unremarkable. There is no concern for recurrence.  -F/u in 6 months  -I offered flu shot today and she reports she may already had it with her PCP.   3. Hypercalcemia -She will follow-up with her primary care physician -She knows to drink fluids adequately and avoid dehydration -Avoid calcium supplement -PTH was normal in 09/2014 -continue monitoring -resolved as of 10/07/17, calcium at 10.3  4. Osteopenia -she is on multivitamin  -She is not qualified for biphosphonate or Prolia due to her risk of fracture   -Continue bone density scan every 2 years, she is due in 2019 -Will get last bone density scan form [facility]  5. Hypertension, diabetes, COPD -She'll continue follow-up with primary care physician  6. Weight Loss -Patient reports gradual loss of weight. She has lost 7 pounds in the last 3 months.  -Has experienced more sever constipation and has lowered dose of amitiza dose from 2 to 1 pills  -I suggest adding ensure to her diet to supplement nutrition -Her weight has been overall stable the past few months  PLAN: -Continue Letrozole, refilled today -f/u in 6 months with labs and evaluaiton -She will monitor her perirectal mass  -Obtain her latest bone density [facility]  She knows to call us in the interim for any questions or concerns. We can certainly see her sooner if needed.  I spent 25 minutes counseling the patient face to face.  The total time spent in the appointment was 30 minutes.  This document serves as a record of services personally performed by Truitt Merle, MD. It was created on her behalf by Joslyn Devon, a trained medical scribe. The creation of this record is based on the scribe's personal observations and the provider's statements to them. This document  has been checked and approved by the  attending provider.    Truitt Merle  10/07/2017

## 2017-10-07 ENCOUNTER — Telehealth: Payer: Self-pay

## 2017-10-07 ENCOUNTER — Ambulatory Visit (HOSPITAL_BASED_OUTPATIENT_CLINIC_OR_DEPARTMENT_OTHER): Payer: Medicare Other | Admitting: Hematology

## 2017-10-07 ENCOUNTER — Other Ambulatory Visit (HOSPITAL_BASED_OUTPATIENT_CLINIC_OR_DEPARTMENT_OTHER): Payer: Medicare Other

## 2017-10-07 DIAGNOSIS — M8580 Other specified disorders of bone density and structure, unspecified site: Secondary | ICD-10-CM

## 2017-10-07 DIAGNOSIS — E1142 Type 2 diabetes mellitus with diabetic polyneuropathy: Secondary | ICD-10-CM

## 2017-10-07 DIAGNOSIS — C50912 Malignant neoplasm of unspecified site of left female breast: Secondary | ICD-10-CM

## 2017-10-07 DIAGNOSIS — C50812 Malignant neoplasm of overlapping sites of left female breast: Secondary | ICD-10-CM

## 2017-10-07 DIAGNOSIS — I10 Essential (primary) hypertension: Secondary | ICD-10-CM

## 2017-10-07 LAB — CBC WITH DIFFERENTIAL/PLATELET
BASO%: 0.7 % (ref 0.0–2.0)
Basophils Absolute: 0.1 10*3/uL (ref 0.0–0.1)
EOS ABS: 0.3 10*3/uL (ref 0.0–0.5)
EOS%: 3.7 % (ref 0.0–7.0)
HCT: 42.9 % (ref 34.8–46.6)
HEMOGLOBIN: 14.4 g/dL (ref 11.6–15.9)
LYMPH%: 12.6 % — ABNORMAL LOW (ref 14.0–49.7)
MCH: 30.4 pg (ref 25.1–34.0)
MCHC: 33.4 g/dL (ref 31.5–36.0)
MCV: 91 fL (ref 79.5–101.0)
MONO#: 0.5 10*3/uL (ref 0.1–0.9)
MONO%: 6.2 % (ref 0.0–14.0)
NEUT%: 76.8 % (ref 38.4–76.8)
NEUTROS ABS: 6.4 10*3/uL (ref 1.5–6.5)
PLATELETS: 298 10*3/uL (ref 145–400)
RBC: 4.72 10*6/uL (ref 3.70–5.45)
RDW: 14.2 % (ref 11.2–14.5)
WBC: 8.3 10*3/uL (ref 3.9–10.3)
lymph#: 1 10*3/uL (ref 0.9–3.3)

## 2017-10-07 LAB — COMPREHENSIVE METABOLIC PANEL
ALT: 12 U/L (ref 0–55)
ANION GAP: 12 meq/L — AB (ref 3–11)
AST: 17 U/L (ref 5–34)
Albumin: 4.2 g/dL (ref 3.5–5.0)
Alkaline Phosphatase: 53 U/L (ref 40–150)
BILIRUBIN TOTAL: 0.89 mg/dL (ref 0.20–1.20)
BUN: 13.7 mg/dL (ref 7.0–26.0)
CO2: 27 meq/L (ref 22–29)
Calcium: 10.3 mg/dL (ref 8.4–10.4)
Chloride: 98 mEq/L (ref 98–109)
Creatinine: 0.9 mg/dL (ref 0.6–1.1)
GLUCOSE: 109 mg/dL (ref 70–140)
POTASSIUM: 3.5 meq/L (ref 3.5–5.1)
Sodium: 138 mEq/L (ref 136–145)
TOTAL PROTEIN: 7.4 g/dL (ref 6.4–8.3)

## 2017-10-07 MED ORDER — LETROZOLE 2.5 MG PO TABS: 2.5000 mg | ORAL_TABLET | Freq: Every day | ORAL | 1 refills | Status: DC

## 2017-10-07 NOTE — Telephone Encounter (Signed)
Printed avs and calender for upcoming appointment. Per 10/29 los

## 2017-10-08 ENCOUNTER — Encounter: Payer: Self-pay | Admitting: Hematology

## 2017-11-04 NOTE — Telephone Encounter (Signed)
Completed.

## 2017-12-15 ENCOUNTER — Telehealth: Payer: Self-pay | Admitting: Hematology

## 2017-12-15 NOTE — Telephone Encounter (Signed)
Lvm advising appt chgd from 1/17 to 1/15 @ 11.15am.

## 2017-12-23 NOTE — Progress Notes (Signed)
[facility] Follow Up Visit  [name] [id number] 1941-03-13 77 y.o. 12/24/2017 9:47 AM   Principle Diagnosis:[name] 77 y.o. 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     Prior Therapy: 1. Patient underwent a screening mammogram and abnormalities were detected in her breasts. Biopsy was positive for invasive cancer. She underwent a bilateral mastectomy on 01/30/05 by Dr. Excell Seltzer. 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   2. Patient later had recurrence along the left mastectomy site of invasive ductal carcinoma, lymph nodes were negative and it was excised on 04/18/10 with negative margins, she underwent radiation therapy, followed by adjuvant Letrozole   Current therapy:  Letrozole 2.42m daily since 06/2010  Interim History:  Joanne Nurse77y.o. female with h/o invasive ductal carcinoma who is here today for f/u. She reports she is doing okay overall. She currently has a cold that has lasted for 1 week. She endorses a productive cough with occasional clear sputum. She does have fatigue and lack of energy that could be related to her cold but she has been feeling this way since Christmas (3 weeks ago). She is able to function in routine activities and she enjoys playing cards. She reports that she has had more difficult bowel movements lately and has had to strain. She takes milk magnesia nightly and notes that it helps more than Miralax did in the past. She notes that her perianal tumor has not increased in size.   Pt is no longer taking Calcium, due to a fracture that she sustained in her right forearm. Calcium deposits started to grow along the break so she was encouraged to stop taking Calcium. She does take a  multivitamin.   On review of systems, pt denies fever, congestion, chest pain, SOB or any other complaints at this time. Pertinent positives are listed and detailed within the above HPI.    Past Medical History:  Diagnosis Date  . Allergic rhinitis   . Asthma    dx in adulthood ((77y/o) PFT 09/08/09 FEV1 1.70/81%; FEV1/FVC 0.75; +resp to dilartor; NI LV/DLCO  . [place]    hx of bilateral diagnosied in 2006, recurred 2011- [name] . Chronic constipation   . Colonic polyp   . [place] obstructive [place] disease) [place]   . Diabetes mellitus   . Dislocation of right elbow 2016  . HTN (hypertension) 11-2011  . Hyperlipidemia   . OSA (obstructive sleep apnea)    CPAP intolerant  . Osteopenia   . Peripheral neuropathy   . 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 Urinary retention    During massive constipation (pelvic exam, suspect 8cm or more in diameter)   Medications:  Current Outpatient Medications  Medication Sig Dispense Refill  . albuterol (VENTOLIN HFA) 108 (90 Base) MCG/ACT inhaler INHALE 2 PUFFS INTO THE LUNGS EVERY 4 HOURS AS NEEDED FOR WHEEZING OR SHORTNESS OF BREATH 18 g 12  . aspirin EC 81 MG tablet Take 81 mg by mouth daily.    .Marland KitchenEPINEPHrine (EPIPEN 2-PAK) 0.3 mg/0.3 mL IJ SOAJ injection Inject 0.3 mLs (0.3 mg total) into the muscle once. (Patient not taking: Reported on 10/07/2017) 2 Device 1  . fexofenadine (ALLEGRA) 180 MG tablet Take 180 mg by mouth daily.     .Marland Kitchen  fluticasone furoate-vilanterol (BREO ELLIPTA) 100-25 MCG/INH AEPB Inhale 1 puff then rinse mouth, once daily 60 each 11  . glucose blood (ONE TOUCH ULTRA TEST) test strip Check blood sugars daily. 100 each 12  . letrozole (FEMARA) 2.5 MG tablet Take 1 tablet (2.5 mg total) by mouth daily. 90 tablet 1  . levalbuterol (XOPENEX) 0.63 MG/3ML nebulizer solution Take 0.63 mg by nebulization every 4 (four) hours as needed for wheezing or shortness of breath. Reported on 01/11/2016     . metFORMIN (GLUCOPHAGE) 1000 MG tablet Take 1 tablet (1,000 mg total) by mouth daily. 90 tablet 1  . Multiple Vitamin (MULTIVITAMIN WITH MINERALS) TABS tablet Take 1 tablet by mouth daily.    . ONE TOUCH LANCETS MISC Use as directed once daily to check blood sugar.  DX E11.9 200 each 2  . polyethylene glycol (MIRALAX) packet Take 17 g by mouth daily. 14 each 0  . simvastatin (ZOCOR) 40 MG tablet Take 1 tablet (40 mg total) by mouth at bedtime. 90 tablet 1  . triamterene-hydrochlorothiazide (MAXZIDE-25) 37.5-25 MG tablet Take 1 tablet by mouth daily. 90 tablet 1   No current facility-administered medications for this visit.      Allergies:  Allergies  Allergen Reactions  . Peanut-Containing Drug Products Swelling  . Penicillins Itching    outer rectal itching    Medical History: Past Medical History:  Diagnosis Date  . Allergic rhinitis   . Asthma    dx in adulthood (77 y/o) PFT 09/08/09 FEV1 1.70/81%; FEV1/FVC 0.75; +resp to dilartor; NI LV/DLCO  . Breast CA ([place])    hx of bilateral diagnosied in 2006, recurred 2011- [name]  . Chronic constipation   . Colonic polyp   . COPD (chronic obstructive pulmonary disease) ([place])   . Diabetes mellitus   . Dislocation of right elbow 2016  . HTN (hypertension) 11-2011  . Hyperlipidemia   . OSA (obstructive sleep apnea)    CPAP intolerant  . Osteopenia   . Peripheral neuropathy   . 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 Urinary retention    During massive constipation (pelvic exam, suspect 8cm or more in diameter)    Surgical History:  Past Surgical History:  Procedure Laterality Date  . ANAL RECTAL MANOMETRY N/A 04/23/2016   Procedure: ANO RECTAL MANOMETRY;  Surgeon: [name], MD;  Location: [facility]COPY;  Service: Endoscopy;  Laterality: N/A;  . APPENDECTOMY     08/2004  . cyct on spine      1967  . ELBOW SURGERY Right 2016   DISLOCATION  . EXPLORATORY LAPAROTOMY    . FLEXIBLE SIGMOIDOSCOPY  N/A 01/31/2013   Procedure: FLEXIBLE SIGMOIDOSCOPY;  Surgeon: [name], MD;  Location: [facility] ENDOSCOPY;  Service: Endoscopy;  Laterality: N/A;  unprepped with sedation  . IR ANGIOGRAM PELVIS SELECTIVE OR SUPRASELECTIVE  05/22/2017  . IR ANGIOGRAM SELECTIVE EACH ADDITIONAL VESSEL  05/22/2017  . IR RADIOLOGIST EVAL & MGMT  04/23/2017  . IR RADIOLOGIST EVAL & MGMT  06/26/2017  . IR US GUIDE BX ASP/DRAIN  06/14/2017  . IR US GUIDE VASC ACCESS LEFT  05/22/2017  . lypoma removed from back-benign 2005     2005  . MASTECTOMY  2006   bilateral  . MASTECTOMY    . Pilonidal Cyst Surgery     ? of  . removal of boils     1970/1980    Review of Systems:  A 10 point review of systems was conducted and is otherwise negative  except for what is noted above.    HEALTH MAINTENANCE:  Mammogram 05/06/12, s/p bialteral mastectomy Colonoscopy 12/10/04, flex sig on 01/2013  Bone Density 05/28/2016: Osteopenia, T score -1.5 at total left hip, slightly improved compared to 01/29/2014. Her estimated 10 year probability of major osteoporotic fracture 6.3% and hip fracture 0.7%   Physical Exam:  Blood pressure (!) 142/78, pulse 88, temperature 98.7 F (37.1 C), resp. rate 18, weight 146 lb 12.8 oz (66.6 kg), SpO2 100 %.   GENERAL: Patient is a well appearing female in no acute distress HEENT:  Sclerae anicteric.  Oropharynx clear and moist. No ulcerations or evidence of oropharyngeal candidiasis. Neck is supple.  NODES:  No cervical, supraclavicular, or axillary lymphadenopathy palpated.  BREAST EXAM: s/p bilateral mastectomy, no nodularity on chest wall, no sign of recurrence. LUNGS:  Clear to auscultation bilaterally.  No wheezes or rhonchi.  Rectum: 2 cm subcutaneous nodule, smooth, It is tender in the perianal area on right side between labia and anus.  HEART:  Regular rate and rhythm. No murmur appreciated. ABDOMEN:  no palpable mass or organomegaly, LLQ tender to palpation; this is likely due to  constipation MSK:  No focal spinal tenderness to palpation. She did have difficulty with range of motion in her left shoulder.  Full range of motion in right upper extremity. EXTREMITIES:  No peripheral edema.   SKIN:  Clear with no obvious rashes or skin changes. No nail dyscrasia. NEURO:  Nonfocal. Well oriented.  Appropriate affect. ECOG PERFORMANCE STATUS: 1 - Symptomatic but completely ambulatory   Lab Results: CBC Latest Ref Rng & Units 12/24/2017 10/07/2017 06/14/2017  WBC 3.9 - 10.3 K/uL 7.4 8.3 10.1  Hemoglobin 11.6 - 15.9 g/dL 14.8 14.4 14.4  Hematocrit 34.8 - 46.6 % 43.8 42.9 41.9  Platelets 145 - 400 K/uL 270 298 327    CMP Latest Ref Rng & Units 12/24/2017 10/07/2017 05/22/2017  Glucose 70 - 140 mg/dL 87 109 87  BUN 7 - 26 mg/dL 16 13.7 10  Creatinine 0.60 - 1.10 mg/dL 0.82 0.9 0.73  Sodium 136 - 145 mmol/L 139 138 135  Potassium 3.3 - 4.7 mmol/L 3.5 3.5 3.2(L)  Chloride 98 - 109 mmol/L 101 - 98(L)  CO2 22 - 29 mmol/L '28 27 26  ' Calcium 8.4 - 10.4 mg/dL 10.2 10.3 9.9  Total Protein 6.4 - 8.3 g/dL 7.4 7.4 -  Total Bilirubin 0.2 - 1.2 mg/dL 0.9 0.89 -  Alkaline Phos 40 - 150 U/L 57 53 -  AST 5 - 34 U/L 22 17 -  ALT 0 - 55 U/L 24 12 -   PATH RESULTS: Diagnosis 06/14/2017 Soft tissue, biopsy, right perianal/buttocks - FINDINGS CONSISTENT WITH SOLITARY FIBROUS TUMOR, SEE COMMENT. Microscopic Comment There is a bland spindle cell proliferation with a haphazard pattern and numerous vessels. There is no significant atypia or necrosis. Immunohistochemistry reveals the spindle cells are positive for CD34 and bcl-2. CD34 and CD31 highlight vessels. SMA, desmin, HHV-8, and CD99 are negative. 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 Dr. Lyndon Code has reviewed the case. The case was called to Dr. Burr Medico on 06/19/2017.  RADIOGRAPHIC:  CT AP 04/04/17 IMPRESSION: 1. Rectal impaction with stool and gas retention throughout the dilated colon, also seen on multiple  prior studies. 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 2. 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 This may be amenable to percutaneous treatment and the patient could be evaluated in the IR Clinic. 3. Cholelithiasis.  PET 06/21/2017 IMPRESSION: 1. Mild metabolic activity associated the perianal thickening and gluteal nodularity is favored inflammatory rather than malignant. (Correlate with pathology from biopsy, unavailable) 2. No evidence of recurrent breast cancer on the at skullbase to thigh FDG scan. 3. Small pulmonary nodules are below the size for PET characterization. Favor benign. If Patient has smoking history recommend follow-up CT in 12 months.  Assessment and Plan: [name] 77 y.o. female with  1. Right perianal fibrous tumor -We previously discussed her biopsy results, which showed fibrous tumor -We discussed this is a benign tumor, surgical resection is usually preferred, if it is feasible. -I discussed her case in our GI tumor Board, and a surgical resection was not recommenced at this time. -I palpated the 2.5cm perirectal mass in today's exam (10/07/17), she he is asymptomatic -I discussed that if this nodule is increasing in size or impacting her bowel movement she should contact us.  -Today (12/24/17) there is no change in her mass and she and I will continue to monitor -I recommended that she use Miralax or milk magnesia twice daily to help with bowel movements -F/u in 6 months, may change to 1 year if mass remains to be stable   2.  ER/PR positive invasive mammary carcinoma in left breast in 2006 with local recurrence in 2011.   -She is status post bilateral mastectomy, on adjuvant letrozole started in 06/2010. She is tolerating well without significant side effects. -Giving her local recurrent disease, I previously discussed extending her letrozole for longer period of time, likely 10 years. She agreed. She has been on it since August  2011. -She previously developed abdominal bloating and constipation lately, with weight loss. She underwent CT of abdomen and pelvis which was unremarkable . No concerns of cancer recurrence. -Due to her recent right perianal lesion, she underwent a PET scan on 06/21/2017, which showed mild hypermetabolism in the right perianal mass (biopsy confirmed fibrous tumor), no other evidence of breast cancer recurrence or metastasis. -We'll continue letrozole. -I previously offered flu shot today (10/07/17) and she reports she may already had it with her PCP.  -She is clinically doing well, her CBC and CMP are within normal limits. Her breast exam was unremarkable. There is no concern for recurrence.  3. Hypercalcemia -She will follow-up with her primary care physician -She knows to drink fluids adequately and avoid dehydration -Avoid calcium supplement -PTH was normal in 09/2014 -continue monitoring -resolved as of 10/07/17, calcium at 10.3 -Continue with a multivitamin  4. Osteopenia -she is on multivitamin  -She is not qualified for biphosphonate or Prolia due to her risk of fracture   -Continue bone density scan every 2 years, she is due in 2019 -Will get last bone density scan form [facility]  5. Hypertension, diabetes, COPD -She'll continue follow-up with primary care physician -She has had a mild clear sputum production lately, no fever, no exam was unremarkable, no concerning for COPD exacerbation -She will see her pulmonologist in April   PLAN: -Continue Letrozole for 2 more years, -f/u in 6 months with labs and evaluaiton -She will monitor her perirectal mass   She knows to call us in the interim for any questions or concerns. We can certainly see her sooner if needed.  I spent 20 minutes counseling the patient face to face.  The total time spent in the appointment was 25 minutes.  This document serves as a record of services personally performed by Truitt Merle, MD. It was created on  her behalf by Theresia Bough, a trained medical scribe. The creation of this record is based on the scribe's personal observations and the provider's statements to them.   I have reviewed the above documentation for accuracy and completeness, and I agree with the above.   Truitt Merle  12/24/2017

## 2017-12-24 ENCOUNTER — Ambulatory Visit: Payer: Medicare Other | Admitting: Internal Medicine

## 2017-12-24 ENCOUNTER — Inpatient Hospital Stay: Payer: Medicare Other | Attending: Hematology | Admitting: Hematology

## 2017-12-24 ENCOUNTER — Inpatient Hospital Stay: Payer: Medicare Other

## 2017-12-24 ENCOUNTER — Encounter: Payer: Self-pay | Admitting: Hematology

## 2017-12-24 ENCOUNTER — Telehealth: Payer: Self-pay | Admitting: Hematology

## 2017-12-24 ENCOUNTER — Other Ambulatory Visit: Payer: Medicare Other

## 2017-12-24 VITALS — BP 142/78 | HR 88 | Temp 98.7°F | Resp 18 | Wt 146.8 lb

## 2017-12-24 DIAGNOSIS — Z9013 Acquired absence of bilateral breasts and nipples: Secondary | ICD-10-CM | POA: Diagnosis not present

## 2017-12-24 DIAGNOSIS — E1140 Type 2 diabetes mellitus with diabetic neuropathy, unspecified: Secondary | ICD-10-CM | POA: Diagnosis not present

## 2017-12-24 DIAGNOSIS — Z170 Estrogen receptor positive status [ER+]: Secondary | ICD-10-CM

## 2017-12-24 DIAGNOSIS — E119 Type 2 diabetes mellitus without complications: Secondary | ICD-10-CM | POA: Diagnosis not present

## 2017-12-24 DIAGNOSIS — Z79811 Long term (current) use of aromatase inhibitors: Secondary | ICD-10-CM | POA: Diagnosis not present

## 2017-12-24 DIAGNOSIS — I10 Essential (primary) hypertension: Secondary | ICD-10-CM | POA: Insufficient documentation

## 2017-12-24 DIAGNOSIS — J449 Chronic obstructive pulmonary disease, unspecified: Secondary | ICD-10-CM | POA: Insufficient documentation

## 2017-12-24 DIAGNOSIS — M8580 Other specified disorders of bone density and structure, unspecified site: Secondary | ICD-10-CM | POA: Insufficient documentation

## 2017-12-24 DIAGNOSIS — C50912 Malignant neoplasm of unspecified site of left female breast: Secondary | ICD-10-CM | POA: Insufficient documentation

## 2017-12-24 DIAGNOSIS — E1142 Type 2 diabetes mellitus with diabetic polyneuropathy: Secondary | ICD-10-CM

## 2017-12-24 LAB — CBC WITH DIFFERENTIAL/PLATELET
BASOS ABS: 0 10*3/uL (ref 0.0–0.1)
BASOS PCT: 1 %
EOS ABS: 0.3 10*3/uL (ref 0.0–0.5)
EOS PCT: 4 %
HCT: 43.8 % (ref 34.8–46.6)
Hemoglobin: 14.8 g/dL (ref 11.6–15.9)
LYMPHS PCT: 14 %
Lymphs Abs: 1 10*3/uL (ref 0.9–3.3)
MCH: 30.8 pg (ref 25.1–34.0)
MCHC: 33.7 g/dL (ref 31.5–36.0)
MCV: 91.5 fL (ref 79.5–101.0)
MONO ABS: 0.5 10*3/uL (ref 0.1–0.9)
Monocytes Relative: 7 %
Neutro Abs: 5.5 10*3/uL (ref 1.5–6.5)
Neutrophils Relative %: 74 %
PLATELETS: 270 10*3/uL (ref 145–400)
RBC: 4.79 MIL/uL (ref 3.70–5.45)
RDW: 13.6 % (ref 11.2–16.1)
WBC: 7.4 10*3/uL (ref 3.9–10.3)

## 2017-12-24 LAB — COMPREHENSIVE METABOLIC PANEL
ALBUMIN: 4.2 g/dL (ref 3.5–5.0)
ALT: 24 U/L (ref 0–55)
AST: 22 U/L (ref 5–34)
Alkaline Phosphatase: 57 U/L (ref 40–150)
Anion gap: 10 (ref 3–11)
BILIRUBIN TOTAL: 0.9 mg/dL (ref 0.2–1.2)
BUN: 16 mg/dL (ref 7–26)
CALCIUM: 10.2 mg/dL (ref 8.4–10.4)
CO2: 28 mmol/L (ref 22–29)
CREATININE: 0.82 mg/dL (ref 0.60–1.10)
Chloride: 101 mmol/L (ref 98–109)
GFR calc Af Amer: >60 mL/min (ref >60)
GFR calc non Af Amer: >60 mL/min (ref >60)
GLUCOSE: 87 mg/dL (ref 70–140)
Potassium: 3.5 mmol/L (ref 3.3–4.7)
Sodium: 139 mmol/L (ref 136–145)
TOTAL PROTEIN: 7.4 g/dL (ref 6.4–8.3)

## 2017-12-24 NOTE — Telephone Encounter (Signed)
Gave avs and calendar for july °

## 2017-12-25 ENCOUNTER — Other Ambulatory Visit: Payer: Self-pay | Admitting: Internal Medicine

## 2017-12-26 ENCOUNTER — Other Ambulatory Visit: Payer: Medicare Other

## 2017-12-26 ENCOUNTER — Ambulatory Visit: Payer: Medicare Other | Admitting: Hematology

## 2017-12-30 ENCOUNTER — Emergency Department (HOSPITAL_COMMUNITY)
Admission: EM | Admit: 2017-12-30 | Discharge: 2017-12-30 | Disposition: A | Discharge status: Home / Self Care | Payer: Medicare Other | Attending: Emergency Medicine | Admitting: Emergency Medicine

## 2017-12-30 ENCOUNTER — Encounter (HOSPITAL_COMMUNITY): Payer: Self-pay

## 2017-12-30 ENCOUNTER — Emergency Department (HOSPITAL_COMMUNITY): Payer: Medicare Other

## 2017-12-30 DIAGNOSIS — Y9301 Activity, walking, marching and hiking: Secondary | ICD-10-CM | POA: Diagnosis not present

## 2017-12-30 DIAGNOSIS — I10 Essential (primary) hypertension: Secondary | ICD-10-CM | POA: Insufficient documentation

## 2017-12-30 DIAGNOSIS — Z87891 Personal history of nicotine dependence: Secondary | ICD-10-CM | POA: Diagnosis not present

## 2017-12-30 DIAGNOSIS — Z7984 Long term (current) use of oral hypoglycemic drugs: Secondary | ICD-10-CM | POA: Diagnosis not present

## 2017-12-30 DIAGNOSIS — R51 Headache: Secondary | ICD-10-CM | POA: Insufficient documentation

## 2017-12-30 DIAGNOSIS — Z7982 Long term (current) use of aspirin: Secondary | ICD-10-CM | POA: Diagnosis not present

## 2017-12-30 DIAGNOSIS — S0990XA Unspecified injury of head, initial encounter: Secondary | ICD-10-CM

## 2017-12-30 DIAGNOSIS — W01198A Fall on same level from slipping, tripping and stumbling with subsequent striking against other object, initial encounter: Secondary | ICD-10-CM | POA: Insufficient documentation

## 2017-12-30 DIAGNOSIS — S0003XA Contusion of scalp, initial encounter: Secondary | ICD-10-CM

## 2017-12-30 DIAGNOSIS — Y92480 Sidewalk as the place of occurrence of the external cause: Secondary | ICD-10-CM | POA: Insufficient documentation

## 2017-12-30 DIAGNOSIS — J45909 Unspecified asthma, uncomplicated: Secondary | ICD-10-CM | POA: Diagnosis not present

## 2017-12-30 DIAGNOSIS — J449 Chronic obstructive pulmonary disease, unspecified: Secondary | ICD-10-CM | POA: Insufficient documentation

## 2017-12-30 DIAGNOSIS — W19XXXA Unspecified fall, initial encounter: Secondary | ICD-10-CM

## 2017-12-30 DIAGNOSIS — E1140 Type 2 diabetes mellitus with diabetic neuropathy, unspecified: Secondary | ICD-10-CM | POA: Diagnosis not present

## 2017-12-30 DIAGNOSIS — Y998 Other external cause status: Secondary | ICD-10-CM | POA: Insufficient documentation

## 2017-12-30 MED ORDER — ACETAMINOPHEN 325 MG PO TABS
650.0000 mg | ORAL_TABLET | Freq: Once | ORAL | Status: AC
  Administered 2017-12-30: 650 mg via ORAL
  Filled 2017-12-30: qty 2

## 2017-12-30 NOTE — ED Triage Notes (Signed)
Pt presents from home via EMS with c/o fall at home. Pt is ambulatory at baseline, uses a cane. Pt slipped on some salt that was on her sidewalk. Pt has a large hematoma to the back right side of her head. No external bleeding noted. No blood thinners aside from 81mg  aspirin daily. Pt is alert and oriented, did not lose consciousness.

## 2017-12-30 NOTE — ED Provider Notes (Signed)
[facility] DEPT Provider Note   CSN: [id number] Arrival date & time: 12/30/17  0815     History   Chief Complaint Chief Complaint  Patient presents with  . Fall    HPI [name] is a 77 y.o. female.  HPI Patient is a 77 year old female who reports slipping and falling today and injuring her right scalp and presents with right parietal scalp hematoma.  No loss consciousness.  Mild headache at this time.  Denies nausea vomiting.  Takes an aspirin daily but no other use of anticoagulants.  Denies neck pain.  No weakness of her arms or legs.  Denies chest and abdominal pain.  No back pain.  No mental status changes.  Patient has been in her normal state of health.  She states that she slipped and fell on an area of the sidewalk as she was preparing to go to a breakfast this morning.   Past Medical History:  Diagnosis Date  . Allergic rhinitis   . Asthma    dx in adulthood (77 y/o) PFT 09/08/09 FEV1 1.70/81%; FEV1/FVC 0.75; +resp to dilartor; NI LV/DLCO  . Breast CA ([place])    hx of bilateral diagnosied in 2006, recurred 2011- [name]  . Chronic constipation   . Colonic polyp   . COPD (chronic obstructive pulmonary disease) ([place])   . Diabetes mellitus   . Dislocation of right elbow 2016  . HTN (hypertension) 11-2011  . Hyperlipidemia   . OSA (obstructive sleep apnea)    CPAP intolerant  . Osteopenia   . Peripheral neuropathy   . 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 Urinary retention    During massive constipation (pelvic exam, suspect 8cm or more in diameter)    Patient Active Problem List   Diagnosis Date Noted  . 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 Aortic atherosclerosis ([place]) 01/06/2017  . Ileus ([place]) 10/02/2015  . PCP NOTES >>>>>>>>>>>>>>>>>>>>>>>>>>>>>> 09/03/2015  . Recurrent cancer of left breast ([place]) 03/16/2015  . Diplopia 10/07/2014  . Chronic insomnia 08/31/2012  . GERD  (gastroesophageal reflux disease) 08/31/2012  . Hypertension 11/26/2011  . Annual physical exam 08/29/2011  . Hives/ angioedema 06/22/2011  . CONSTIPATION, CHRONIC 02/05/2011  . ALLERGIC REACTION, ACUTE 11/27/2010  . HERPES SIMPLEX INFECTION 10/17/2010  . SHOULDER, PAIN 04/07/2010  . LEG PAIN 06/09/2009  . ABDOMINAL PAIN, LEFT LOWER QUADRANT 06/09/2009  . Hyperlipidemia 11/04/2007  . DM II (diabetes mellitus, type II), controlled ([place]) 08/04/2007  . OBESITY 05/16/2007  . Obstructive sleep apnea 05/16/2007  . PERIPHERAL NEUROPATHY 05/16/2007  . Seasonal allergic rhinitis 05/16/2007  . Moderate persistent asthmatic bronchitis with acute exacerbation 05/16/2007  . Osteopenia 05/16/2007  . BREAST CANCER, HX OF 05/16/2007  . COLONIC POLYPS, HX OF 05/16/2007    Past Surgical History:  Procedure Laterality Date  . ANAL RECTAL MANOMETRY N/A 04/23/2016   Procedure: ANO RECTAL MANOMETRY;  Surgeon: [name], MD;  Location: [facility];  Service: Endoscopy;  Laterality: N/A;  . APPENDECTOMY     08/2004  . cyct on spine      1967  . ELBOW SURGERY Right 2016   DISLOCATION  . EXPLORATORY LAPAROTOMY    . FLEXIBLE SIGMOIDOSCOPY N/A 01/31/2013   Procedure: FLEXIBLE SIGMOIDOSCOPY;  Surgeon: [name], MD;  Location: [facility] ENDOSCOPY;  Service: Endoscopy;  Laterality: N/A;  unprepped with sedation  . IR ANGIOGRAM PELVIS SELECTIVE OR SUPRASELECTIVE  05/22/2017  . IR ANGIOGRAM SELECTIVE EACH ADDITIONAL VESSEL  05/22/2017  .  IR RADIOLOGIST EVAL & MGMT  04/23/2017  . IR RADIOLOGIST EVAL & MGMT  06/26/2017  . IR US GUIDE BX ASP/DRAIN  06/14/2017  . IR US GUIDE VASC ACCESS LEFT  05/22/2017  . lypoma removed from back-benign 2005     2005  . MASTECTOMY  2006   bilateral  . MASTECTOMY    . Pilonidal Cyst Surgery     ? of  . removal of boils     1970/1980    OB History    No data available       Home Medications    Prior to Admission medications   Medication Sig Start Date End Date  Taking? Authorizing Provider  albuterol (VENTOLIN HFA) 108 (90 Base) MCG/ACT inhaler INHALE 2 PUFFS INTO THE LUNGS EVERY 4 HOURS AS NEEDED FOR WHEEZING OR SHORTNESS OF BREATH 06/20/17  Yes Young, Tarri Fuller D, MD  aspirin EC 81 MG tablet Take 81 mg by mouth daily.   Yes [provider]  EPINEPHrine (EPIPEN 2-PAK) 0.3 mg/0.3 mL IJ SOAJ injection Inject 0.3 mLs (0.3 mg total) into the muscle once. 04/11/15  Yes Paz, Alda Berthold, MD  fexofenadine (ALLEGRA) 180 MG tablet Take 180 mg by mouth daily.    Yes [provider]  glucose blood (ONE TOUCH ULTRA TEST) test strip Check blood sugars daily. 05/24/17  Yes Paz, Alda Berthold, MD  letrozole [facility]) 2.5 MG tablet Take 1 tablet (2.5 mg total) by mouth daily. 10/07/17  Yes Truitt Merle, MD  magnesium hydroxide (MILK OF MAGNESIA) 400 MG/5ML suspension Take 15 mLs by mouth daily as needed for mild constipation.   Yes [provider]  metFORMIN (GLUCOPHAGE) 1000 MG tablet Take 1 tablet (1,000 mg total) by mouth daily. 12/25/17  Yes Paz, Alda Berthold, MD  Multiple Vitamin (MULTIVITAMIN WITH MINERALS) TABS tablet Take 1 tablet by mouth daily.   Yes [provider]  ONE TOUCH LANCETS MISC Use as directed once daily to check blood sugar.  DX E11.9 04/12/16  Yes Paz, Alda Berthold, MD  simvastatin (ZOCOR) 40 MG tablet Take 1 tablet (40 mg total) by mouth at bedtime. Patient taking differently: Take 40 mg by mouth daily.  02/19/17  Yes Paz, Alda Berthold, MD  triamterene-hydrochlorothiazide (MAXZIDE-25) 37.5-25 MG tablet Take 1 tablet by mouth daily. 01/04/17  Yes Paz, Alda Berthold, MD  fluticasone furoate-vilanterol (BREO ELLIPTA) 100-25 MCG/INH AEPB Inhale 1 puff then rinse mouth, once daily Patient not taking: Reported on 12/30/2017 01/11/17   Deneise Lever, MD  polyethylene glycol [facility]) packet Take 17 g by mouth daily. Patient not taking: Reported on 12/30/2017 04/13/17   Ezequiel Essex, MD    Family History Family History  Problem Relation Age of Onset  . COPD  Mother        smoker  . Hypertension Mother   . Lung cancer Father        died age 39  . Breast cancer Sister        GM,sister  . Diabetes Brother   . Cancer Maternal Grandmother        breast   . Colon cancer Neg Hx   . Heart attack Neg Hx        no h/o early diseae    Social History Social History   Tobacco Use  . Smoking status: Former Smoker    Packs/day: 1.00    Years: 4.00    Pack years: 4.00    Types: Cigarettes    Last attempt to quit:  04/01/1968    Years since quitting: 49.7  . Smokeless tobacco: Former Systems developer    Quit date: 12/11/1967  . Tobacco comment: Quit at age 30  Substance Use Topics  . Alcohol use: No  . Drug use: No     Allergies   Peanut-containing drug products and Penicillins   Review of Systems Review of Systems  All other systems reviewed and are negative.    Physical Exam Updated Vital Signs BP 126/63 (BP Location: Left Arm)   Pulse 84   Temp 97.9 F (36.6 C) (Oral)   Resp 18   Ht 5' 3.5" (1.613 m)   Wt 66.7 kg (147 lb)   SpO2 98%   BMI 25.63 kg/m   Physical Exam General: Well-appearing.  No acute distress HEENT: Normocephalic.  Small hematoma right parietal scalp without laceration or bleeding.   Neck: C-spine nontender.  C-spine cleared by Nexus criteria.   Eyes: Extraocular movements are normal Cardiovascular regular rate and rhythm.  No murmur Pulmonary: Lungs are clear bilaterally.  Effort normal Abdomen: Soft. Nontender.  No distention Extremity: Full range of motion of major joints. Neuro: Alert and oriented x3 Psych: Affect normal   ED Treatments / Results  Labs (all labs ordered are listed, but only abnormal results are displayed) Labs Reviewed - No data to display  EKG  EKG Interpretation None       Radiology Ct Head Wo Contrast  Result Date: 12/30/2017 CLINICAL DATA:  Fall. EXAM: CT HEAD WITHOUT CONTRAST TECHNIQUE: Contiguous axial images were obtained from the base of the skull through the vertex  without intravenous contrast. COMPARISON:  CT head dated January 15, 2015. FINDINGS: Brain: No evidence of acute infarction, hemorrhage, hydrocephalus, extra-axial collection or mass lesion/mass effect. Stable atrophy and mild chronic microvascular ischemic changes. Vascular: Intracranial atherosclerosis.  No hyperdense vessel. Skull: Normal. Negative for fracture or focal lesion. Sinuses/Orbits: No acute finding. Other: Small right posterior parietal scalp hematoma. IMPRESSION: 1. No acute intracranial abnormality. Small right posterior parietal scalp hematoma. Electronically Signed   By: Titus Dubin M.D.   On: 12/30/2017 09:53    Procedures Procedures (including critical care time)  Medications Ordered in ED Medications  acetaminophen (TYLENOL) tablet 650 mg (650 mg Oral Given 12/30/17 0904)     Initial Impression / Assessment and Plan / ED Course  I have reviewed the triage vital signs and the nursing notes.  Pertinent labs & imaging results that were available during my care of the patient were reviewed by me and considered in my medical decision making (see chart for details).     Minor closed head injury.  CT imaging without acute intracranial abnormality.  Small scalp hematoma noted.  Concussion and closed head injury warnings given to the patient.  C-spine is nontender cleared by Nexus criteria.  Chest and abdomen are benign.  Full range of motion of major joints of upper and lower extremities without weakness of her arms or legs.  Close primary care follow-up.  Patient understands to return to the ER for new or worsening symptoms   Final Clinical Impressions(s) / ED Diagnoses   Final diagnoses:  Closed head injury, initial encounter  Fall, initial encounter  Scalp hematoma, initial encounter    ED Discharge Orders    None       Jola Schmidt, MD 12/30/17 1051

## 2018-02-01 ENCOUNTER — Other Ambulatory Visit: Payer: Self-pay | Admitting: Internal Medicine

## 2018-03-05 ENCOUNTER — Other Ambulatory Visit: Payer: Self-pay | Admitting: Internal Medicine

## 2018-03-06 ENCOUNTER — Encounter: Payer: Self-pay | Admitting: *Deleted

## 2018-03-11 NOTE — Progress Notes (Addendum)
Subjective:   [name] is a 77 y.o. female who presents for Medicare Annual (Subsequent) preventive examination.  Review of Systems: No ROS.  Medicare Wellness Visit. Additional risk factors are reflected in the social history.  Cardiac Risk Factors include: advanced age (>42men, >35 women);diabetes mellitus;dyslipidemia;hypertension Sleep patterns: wakes 3 x to urinate. Naps as needed. Home Safety/Smoke Alarms: Feels safe in home. Smoke alarms in place.  Living environment; residence and Firearm Safety: Lives in 1 story home. Son lives with her.   Female:         Mammo- pt reports none since double mastectomy 2006       Dexa scan-active order        CCS-Ordered.     Objective:     Vitals: BP (!) 143/84 (BP Location: Left Wrist, Patient Position: Sitting, Cuff Size: Normal)   Pulse 98   Ht 5\' 3"  (1.6 m)   Wt 148 lb 3.2 oz (67.2 kg)   SpO2 98%   BMI 26.25 kg/m   Body mass index is 26.25 kg/m.  Advanced Directives 03/14/2018 12/30/2017 06/24/2017 06/14/2017 05/22/2017 05/09/2017 04/08/2017  Does Patient Have a Medical Advance Directive? No No No No No No No  Would patient like information on creating a medical advance directive? No - Patient declined No - Patient declined - No - Patient declined No - Patient declined No - Patient declined -  Pre-existing out of facility DNR order (yellow form or pink MOST form) - - - - - - -    Tobacco Social History   Tobacco Use  Smoking Status Former Smoker  . Packs/day: 1.00  . Years: 4.00  . Pack years: 4.00  . Types: Cigarettes  . Last attempt to quit: 04/01/1968  . Years since quitting: 49.9  Smokeless Tobacco Former Systems developer  . Quit date: 12/11/1967  Tobacco Comment   Quit at age 54     Counseling given: Not Answered Comment: Quit at age 87   Clinical Intake: Pain : No/denies pain      Past Medical History:  Diagnosis Date  . Allergic rhinitis   . Asthma    dx in adulthood (77 y/o) PFT 09/08/09 FEV1 1.70/81%;  FEV1/FVC 0.75; +resp to dilartor; NI LV/DLCO  . Breast CA ([place])    hx of bilateral diagnosied in 2006, recurred 2011- [name]  . Chronic constipation   . Colonic polyp   . COPD (chronic obstructive pulmonary disease) ([place])   . Diabetes mellitus   . Dislocation of right elbow 2016  . HTN (hypertension) 11-2011  . Hyperlipidemia   . OSA (obstructive sleep apnea)    CPAP intolerant  . Osteopenia   . Peripheral neuropathy   . 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 Urinary retention    During massive constipation (pelvic exam, suspect 8cm or more in diameter)   Past Surgical History:  Procedure Laterality Date  . ANAL RECTAL MANOMETRY N/A 04/23/2016   Procedure: ANO RECTAL MANOMETRY;  Surgeon: [name], MD;  Location: [facility] ENDOSCOPY;  Service: Endoscopy;  Laterality: N/A;  . APPENDECTOMY     08/2004  . cyct on spine      1967  . ELBOW SURGERY Right 2016   DISLOCATION  . EXPLORATORY LAPAROTOMY    . FLEXIBLE SIGMOIDOSCOPY N/A 01/31/2013   Procedure: FLEXIBLE SIGMOIDOSCOPY;  Surgeon: [name], MD;  Location: [facility];  Service: Endoscopy;  Laterality: N/A;  unprepped with sedation  . IR ANGIOGRAM PELVIS SELECTIVE OR SUPRASELECTIVE  05/22/2017  . IR ANGIOGRAM SELECTIVE EACH ADDITIONAL VESSEL  05/22/2017  . IR RADIOLOGIST EVAL & MGMT  04/23/2017  . IR RADIOLOGIST EVAL & MGMT  06/26/2017  . IR US GUIDE BX ASP/DRAIN  06/14/2017  . IR US GUIDE VASC ACCESS LEFT  05/22/2017  . lypoma removed from back-benign 2005     2005  . MASTECTOMY  2006   bilateral  . MASTECTOMY    . Pilonidal Cyst Surgery     ? of  . removal of boils     1970/1980   Family History  Problem Relation Age of Onset  . COPD Mother        smoker  . Hypertension Mother   . Lung cancer Father        died age 44  . Breast cancer Sister        GM,sister  . Diabetes Brother   . Cancer Maternal Grandmother        breast   . Colon cancer Neg Hx   . Heart attack Neg Hx        no  h/o early diseae   Social History   Socioeconomic History  . Marital status: Widowed    Spouse name: Not on file  . Number of children: 2  . Years of education: Not on file  . Highest education level: Not on file  Occupational History  . Occupation: retired    Fish farm manager: RETIRED  Social Needs  . Financial resource strain: Not on file  . Food insecurity:    Worry: Not on file    Inability: Not on file  . Transportation needs:    Medical: Not on file    Non-medical: Not on file  Tobacco Use  . Smoking status: Former Smoker    Packs/day: 1.00    Years: 4.00    Pack years: 4.00    Types: Cigarettes    Last attempt to quit: 04/01/1968    Years since quitting: 49.9  . Smokeless tobacco: Former Systems developer    Quit date: 12/11/1967  . Tobacco comment: Quit at age 64  Substance and Sexual Activity  . Alcohol use: No  . Drug use: No  . Sexual activity: Not Currently  Lifestyle  . Physical activity:    Days per week: Not on file    Minutes per session: Not on file  . Stress: Not on file  Relationships  . Social connections:    Talks on phone: Not on file    Gets together: Not on file    Attends religious service: Not on file    Active member of club or organization: Not on file    Attends meetings of clubs or organizations: Not on file    Relationship status: Not on file  Other Topics Concern  . 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 husband 4-10, lives alone, retired Pharmacist, hospital   2 children    [place] twins      Outpatient Encounter Medications as of 03/14/2018  Medication Sig  . albuterol (VENTOLIN HFA) 108 (90 Base) MCG/ACT inhaler INHALE 2 PUFFS INTO THE LUNGS EVERY 4 HOURS AS NEEDED FOR WHEEZING OR SHORTNESS OF BREATH  . aspirin EC 81 MG tablet Take 81 mg by mouth daily.  . fexofenadine (ALLEGRA) 180 MG tablet Take 180 mg by mouth daily.   . fluticasone furoate-vilanterol (BREO ELLIPTA) 100-25 MCG/INH AEPB Inhale 1 puff then rinse mouth, once daily  . glucose blood  (ONE TOUCH ULTRA  TEST) test strip Check blood sugars daily.  Marland Kitchen letrozole (FEMARA) 2.5 MG tablet Take 1 tablet (2.5 mg total) by mouth daily.  . magnesium hydroxide (MILK OF MAGNESIA) 400 MG/5ML suspension Take 15 mLs by mouth daily as needed for mild constipation.  . metFORMIN (GLUCOPHAGE) 1000 MG tablet Take 1 tablet (1,000 mg total) by mouth daily.  . Multiple Vitamin (MULTIVITAMIN WITH MINERALS) TABS tablet Take 1 tablet by mouth daily.  . ONE TOUCH LANCETS MISC Use as directed once daily to check blood sugar.  DX E11.9  . simvastatin (ZOCOR) 40 MG tablet Take 1 tablet (40 mg total) by mouth at bedtime. (Patient taking differently: Take 40 mg by mouth daily. )  . triamterene-hydrochlorothiazide (MAXZIDE-25) 37.5-25 MG tablet Take 1 tablet by mouth daily.  Marland Kitchen EPINEPHrine (EPIPEN 2-PAK) 0.3 mg/0.3 mL IJ SOAJ injection Inject 0.3 mLs (0.3 mg total) into the muscle once. (Patient not taking: Reported on 03/14/2018)  . polyethylene glycol (MIRALAX) packet Take 17 g by mouth daily. (Patient not taking: Reported on 12/30/2017)  . [DISCONTINUED] potassium chloride (K-DUR) 10 MEQ tablet Take 1 tablet (10 mEq total) by mouth daily.   No facility-administered encounter medications on file as of 03/14/2018.     Activities of Daily Living In your present state of health, do you have any difficulty performing the following activities: 03/14/2018 06/14/2017  Hearing? N N  Vision? N N  Comment wears glasses for reading. Pt reports she does not wear them to drive. Dr.Whittaker -  Difficulty concentrating or making decisions? N N  Walking or climbing stairs? N N  Comment holds to banistter -  Dressing or bathing? N Y  Doing errands, shopping? N -  Preparing Food and eating ? N -  Using the Toilet? N -  In the past six months, have you accidently leaked urine? N -  Do you have problems with loss of bowel control? N -  Managing your Medications? N -  Managing your Finances? N -  Housekeeping or managing your  Housekeeping? N -  Some recent data might be hidden    Patient Care Team: Colon Branch, MD as PCP - General Deneise Lever, MD as Consulting Physician (Pulmonary Disease) Causey, Charlestine Massed, NP as Nurse Practitioner (Hematology and Oncology) Excell Seltzer, MD as Consulting Physician (General Surgery) Pieter Partridge, DO as Consulting Physician (Neurology) Alexis Frock, MD as Consulting Physician (Urology) [name], MD as Consulting Physician (Gastroenterology) Marylynn Pearson, MD as Consulting Physician (Ophthalmology) Elam Dutch, MD as Consulting Physician (Vascular Surgery)    Assessment:   This is a routine wellness examination for [name]. Physical assessment deferred to PCP.   Exercise Activities and Dietary recommendations Current Exercise Habits: The patient does not participate in regular exercise at present(in PT for balance), Exercise limited by: orthopedic condition(s)(uses cane) Diet (meal preparation, eat out, water intake, caffeinated beverages, dairy products, fruits and vegetables): in general, a "healthy" diet  , well balanced   Goals    . Continue with PT        Fall Risk Fall Risk  03/14/2018 02/14/2017 10/16/2016 10/07/2015 10/07/2014  Falls in the past year? Yes No No No No  Number falls in past yr: 2 or more - - - -  Comment - - - - -  Injury with Fall? Yes - - - -  Follow up Education provided;Falls prevention discussed - - - -     Depression Screen PHQ 2/9 Scores 03/14/2018 02/14/2017 10/16/2016 10/07/2015  PHQ -  2 Score 0 0 0 0     Cognitive Function MMSE - Mini Mental State Exam 02/14/2017  Orientation to time 5  Orientation to Place 5  Registration 3  Attention/ Calculation 5  Recall 3  Language- name 2 objects 2  Language- repeat 1  Language- follow 3 step command 3  Language- read & follow direction 1  Write a sentence 1  Copy design 0  Total score 29        Immunization History  Administered Date(s)  Administered  . Influenza Split 10/16/2011, 08/22/2012  . Influenza Whole 10/08/2008, 01/20/2010, 08/04/2010  . Influenza,inj,Quad PF,6+ Mos 08/21/2013, 09/21/2014, 09/29/2015, 09/24/2016  . Pneumococcal Conjugate-13 10/07/2014  . Pneumococcal Polysaccharide-23 12/10/2001, 11/04/2007  . Td 12/10/2001  . Tdap 10/01/2013  . Zoster 10/08/2008  . Zoster Recombinat (Shingrix) 04/25/2017    Screening Tests Health Maintenance  Topic Date Due  . URINE MICROALBUMIN  01/20/2011  . HEMOGLOBIN A1C  10/26/2017  . OPHTHALMOLOGY EXAM  02/26/2018  . MAMMOGRAM  03/15/2019 (Originally 05/06/2014)  . FOOT EXAM  04/25/2018  . INFLUENZA VACCINE  07/10/2018  . TETANUS/TDAP  10/02/2023  . COLONOSCOPY  11/10/2025  . DEXA SCAN  Completed  . PNA vac Low Risk Adult  Completed       Plan:  Please schedule appoint to follow up with Dr.Paz.   I have ordered your colonoscopy as discussed. They will call you to schedule appointment.  Please schedule appointment with your eye doctor.  Continue to eat heart healthy diet (full of fruits, vegetables, whole grains, lean protein, water--limit salt, fat, and sugar intake) and increase physical activity as tolerated.  Continue doing brain stimulating activities (puzzles, reading, adult coloring books, staying active) to keep memory sharp.   I have personally reviewed and noted the following in the patient's chart:   . Medical and social history . Use of alcohol, tobacco or illicit drugs  . Current medications and supplements . Functional ability and status . Nutritional status . Physical activity . Advanced directives . List of other physicians . Hospitalizations, surgeries, and ER visits in previous 12 months . Vitals . Screenings to include cognitive, depression, and falls . Referrals and appointments  In addition, I have reviewed and discussed with patient certain preventive protocols, quality metrics, and best practice recommendations. A written  personalized care plan for preventive services as well as general preventive health recommendations were provided to patient.     Naaman Plummer [place], [place]  03/14/2018  Kathlene November, MD

## 2018-03-14 ENCOUNTER — Encounter: Payer: Self-pay | Admitting: *Deleted

## 2018-03-14 ENCOUNTER — Ambulatory Visit (INDEPENDENT_AMBULATORY_CARE_PROVIDER_SITE_OTHER): Payer: Medicare Other | Admitting: *Deleted

## 2018-03-14 VITALS — BP 143/84 | HR 98 | Ht 63.0 in | Wt 148.2 lb

## 2018-03-14 DIAGNOSIS — Z0000 Encounter for general adult medical examination without abnormal findings: Secondary | ICD-10-CM | POA: Diagnosis not present

## 2018-03-14 DIAGNOSIS — Z1211 Encounter for screening for malignant neoplasm of colon: Secondary | ICD-10-CM | POA: Diagnosis not present

## 2018-03-14 NOTE — Patient Instructions (Signed)
Please schedule appoint to follow up with Dr.Paz.   I have ordered your colonoscopy as discussed. They will call you to schedule appointment.  Please schedule appointment with your eye doctor.  Continue to eat heart healthy diet (full of fruits, vegetables, whole grains, lean protein, water--limit salt, fat, and sugar intake) and increase physical activity as tolerated.  Continue doing brain stimulating activities (puzzles, reading, adult coloring books, staying active) to keep memory sharp.    [name] , Thank you for taking time to come for your Medicare Wellness Visit. I appreciate your ongoing commitment to your health goals. Please review the following plan we discussed and let me know if I can assist you in the future.   These are the goals we discussed: Goals    . Continue with PT        This is a list of the screening recommended for you and due dates:  Health Maintenance  Topic Date Due  . Urine Protein Check  01/20/2011  . Hemoglobin A1C  10/26/2017  . Eye exam for diabetics  02/26/2018  . Mammogram  03/15/2019*  . Complete foot exam   04/25/2018  . Flu Shot  07/10/2018  . Tetanus Vaccine  10/02/2023  . Colon Cancer Screening  11/10/2025  . DEXA scan (bone density measurement)  Completed  . Pneumonia vaccines  Completed  *Topic was postponed. The date shown is not the original due date.     Health Maintenance for Postmenopausal Women Menopause is a normal process in which your reproductive ability comes to an end. This process happens gradually over a span of months to years, usually between the ages of 77 and 18. Menopause is complete when you have missed 12 consecutive menstrual periods. It is important to talk with your health care provider about some of the most common conditions that affect postmenopausal women, such as heart disease, cancer, and bone loss (osteoporosis). Adopting a healthy lifestyle and getting preventive care can help to promote your  health and wellness. Those actions can also lower your chances of developing some of these common conditions. What should I know about menopause? During menopause, you may experience a number of symptoms, such as:  Moderate-to-severe hot flashes.  Night sweats.  Decrease in sex drive.  Mood swings.  Headaches.  Tiredness.  Irritability.  Memory problems.  Insomnia.  Choosing to treat or not to treat menopausal changes is an individual decision that you make with your health care provider. What should I know about hormone replacement therapy and supplements? Hormone therapy products are effective for treating symptoms that are associated with menopause, such as hot flashes and night sweats. Hormone replacement carries certain risks, especially as you become older. If you are thinking about using estrogen or estrogen with progestin treatments, discuss the benefits and risks with your health care provider. What should I know about heart disease and stroke? Heart disease, heart attack, and stroke become more likely as you age. This may be due, in part, to the hormonal changes that your body experiences during menopause. These can affect how your body processes dietary fats, triglycerides, and cholesterol. Heart attack and stroke are both medical emergencies. There are many things that you can do to help prevent heart disease and stroke:  Have your blood pressure checked at least every 1-2 years. High blood pressure causes heart disease and increases the risk of stroke.  If you are 73-77 years old, ask your health care provider if you should take aspirin to  prevent a heart attack or a stroke.  Do not use any tobacco products, including cigarettes, chewing tobacco, or electronic cigarettes. If you need help quitting, ask your health care provider.  It is important to eat a healthy diet and maintain a healthy weight. ? Be sure to include plenty of vegetables, fruits, low-fat dairy  products, and lean protein. ? Avoid eating foods that are high in solid fats, added sugars, or salt (sodium).  Get regular exercise. This is one of the most important things that you can do for your health. ? Try to exercise for at least 150 minutes each week. The type of exercise that you do should increase your heart rate and make you sweat. This is known as moderate-intensity exercise. ? Try to do strengthening exercises at least twice each week. Do these in addition to the moderate-intensity exercise.  Know your numbers.Ask your health care provider to check your cholesterol and your blood glucose. Continue to have your blood tested as directed by your health care provider.  What should I know about cancer screening? There are several types of cancer. Take the following steps to reduce your risk and to catch any cancer development as early as possible. Breast Cancer  Practice breast self-awareness. ? This means understanding how your breasts normally appear and feel. ? It also means doing regular breast self-exams. Let your health care provider know about any changes, no matter how small.  If you are 33 or older, have a clinician do a breast exam (clinical breast exam or CBE) every year. Depending on your age, family history, and medical history, it may be recommended that you also have a yearly breast X-ray (mammogram).  If you have a family history of breast cancer, talk with your health care provider about genetic screening.  If you are at high risk for breast cancer, talk with your health care provider about having an MRI and a mammogram every year.  Breast cancer (BRCA) gene test is recommended for women who have family members with BRCA-related cancers. Results of the assessment will determine the need for genetic counseling and BRCA1 and for BRCA2 testing. BRCA-related cancers include these types: ? Breast. This occurs in males or females. ? Ovarian. ? Tubal. This may also be  called fallopian tube cancer. ? Cancer of the abdominal or pelvic lining (peritoneal cancer). ? Prostate. ? Pancreatic.  Cervical, Uterine, and Ovarian Cancer Your health care provider may recommend that you be screened regularly for cancer of the pelvic organs. These include your ovaries, uterus, and vagina. This screening involves a pelvic exam, which includes checking for microscopic changes to the surface of your cervix (Pap test).  For women ages 21-65, health care providers may recommend a pelvic exam and a Pap test every three years. For women ages 77-65, they may recommend the Pap test and pelvic exam, combined with testing for human papilloma virus (HPV), every five years. Some types of HPV increase your risk of cervical cancer. Testing for HPV may also be done on women of any age who have unclear Pap test results.  Other health care providers may not recommend any screening for nonpregnant women who are considered low risk for pelvic cancer and have no symptoms. Ask your health care provider if a screening pelvic exam is right for you.  If you have had past treatment for cervical cancer or a condition that could lead to cancer, you need Pap tests and screening for cancer for at least 20 years after  your treatment. If Pap tests have been discontinued for you, your risk factors (such as having a new sexual partner) need to be reassessed to determine if you should start having screenings again. Some women have medical problems that increase the chance of getting cervical cancer. In these cases, your health care provider may recommend that you have screening and Pap tests more often.  If you have a family history of uterine cancer or ovarian cancer, talk with your health care provider about genetic screening.  If you have vaginal bleeding after reaching menopause, tell your health care provider.  There are currently no reliable tests available to screen for ovarian cancer.  Lung  Cancer Lung cancer screening is recommended for adults 50-9 years old who are at high risk for lung cancer because of a history of smoking. A yearly low-dose CT scan of the lungs is recommended if you:  Currently smoke.  Have a history of at least 30 pack-years of smoking and you currently smoke or have quit within the past 15 years. A pack-year is smoking an average of one pack of cigarettes per day for one year.  Yearly screening should:  Continue until it has been 15 years since you quit.  Stop if you develop a health problem that would prevent you from having lung cancer treatment.  Colorectal Cancer  This type of cancer can be detected and can often be prevented.  Routine colorectal cancer screening usually begins at age 64 and continues through age 30.  If you have risk factors for colon cancer, your health care provider may recommend that you be screened at an earlier age.  If you have a family history of colorectal cancer, talk with your health care provider about genetic screening.  Your health care provider may also recommend using home test kits to check for hidden blood in your stool.  A small camera at the end of a tube can be used to examine your colon directly (sigmoidoscopy or colonoscopy). This is done to check for the earliest forms of colorectal cancer.  Direct examination of the colon should be repeated every 5-10 years until age 30. However, if early forms of precancerous polyps or small growths are found or if you have a family history or genetic risk for colorectal cancer, you may need to be screened more often.  Skin Cancer  Check your skin from head to toe regularly.  Monitor any moles. Be sure to tell your health care provider: ? About any new moles or changes in moles, especially if there is a change in a mole's shape or color. ? If you have a mole that is larger than the size of a pencil eraser.  If any of your family members has a history of skin  cancer, especially at a young age, talk with your health care provider about genetic screening.  Always use sunscreen. Apply sunscreen liberally and repeatedly throughout the day.  Whenever you are outside, protect yourself by wearing long sleeves, pants, a wide-brimmed hat, and sunglasses.  What should I know about osteoporosis? Osteoporosis is a condition in which bone destruction happens more quickly than new bone creation. After menopause, you may be at an increased risk for osteoporosis. To help prevent osteoporosis or the bone fractures that can happen because of osteoporosis, the following is recommended:  If you are 29-81 years old, get at least 1,000 mg of calcium and at least 600 mg of vitamin D per day.  If you are older than  age 65 but younger than age 1, get at least 1,200 mg of calcium and at least 600 mg of vitamin D per day.  If you are older than age 32, get at least 1,200 mg of calcium and at least 800 mg of vitamin D per day.  Smoking and excessive alcohol intake increase the risk of osteoporosis. Eat foods that are rich in calcium and vitamin D, and do weight-bearing exercises several times each week as directed by your health care provider. What should I know about how menopause affects my mental health? Depression may occur at any age, but it is more common as you become older. Common symptoms of depression include:  Low or sad mood.  Changes in sleep patterns.  Changes in appetite or eating patterns.  Feeling an overall lack of motivation or enjoyment of activities that you previously enjoyed.  Frequent crying spells.  Talk with your health care provider if you think that you are experiencing depression. What should I know about immunizations? It is important that you get and maintain your immunizations. These include:  Tetanus, diphtheria, and pertussis (Tdap) booster vaccine.  Influenza every year before the flu season begins.  Pneumonia  vaccine.  Shingles vaccine.  Your health care provider may also recommend other immunizations. This information is not intended to replace advice given to you by your health care provider. Make sure you discuss any questions you have with your health care provider. Document Released: 01/18/2006 Document Revised: 06/15/2016 Document Reviewed: 08/30/2015 Elsevier Interactive Patient Education  2018 [employer].

## 2018-03-17 ENCOUNTER — Ambulatory Visit: Payer: Medicare Other | Admitting: Internal Medicine

## 2018-03-17 ENCOUNTER — Encounter: Payer: Self-pay | Admitting: Internal Medicine

## 2018-03-17 ENCOUNTER — Ambulatory Visit (INDEPENDENT_AMBULATORY_CARE_PROVIDER_SITE_OTHER): Payer: Medicare Other | Admitting: Internal Medicine

## 2018-03-17 VITALS — BP 126/64 | HR 71 | Temp 98.0°F | Resp 16 | Ht 63.5 in | Wt 146.8 lb

## 2018-03-17 DIAGNOSIS — L97509 Non-pressure chronic ulcer of other part of unspecified foot with unspecified severity: Secondary | ICD-10-CM

## 2018-03-17 DIAGNOSIS — S0990XD Unspecified injury of head, subsequent encounter: Secondary | ICD-10-CM

## 2018-03-17 DIAGNOSIS — W19XXXD Unspecified fall, subsequent encounter: Secondary | ICD-10-CM

## 2018-03-17 DIAGNOSIS — Z23 Encounter for immunization: Secondary | ICD-10-CM

## 2018-03-17 DIAGNOSIS — G629 Polyneuropathy, unspecified: Secondary | ICD-10-CM

## 2018-03-17 DIAGNOSIS — E11621 Type 2 diabetes mellitus with foot ulcer: Secondary | ICD-10-CM

## 2018-03-17 DIAGNOSIS — Z0000 Encounter for general adult medical examination without abnormal findings: Secondary | ICD-10-CM

## 2018-03-17 LAB — HEMOGLOBIN A1C: Hgb A1c MFr Bld: 5.8 % (ref 4.6–6.5)

## 2018-03-17 LAB — TSH: TSH: 0.64 u[IU]/mL (ref 0.35–4.50)

## 2018-03-17 LAB — B12 AND FOLATE PANEL
Folate: >23.6 ng/mL (ref >5.9)
VITAMIN B 12: 460 pg/mL (ref 211–911)

## 2018-03-17 NOTE — Progress Notes (Signed)
Pre visit review using our clinic review tool, if applicable. No additional management support is needed unless otherwise documented below in the visit note. 

## 2018-03-17 NOTE — Patient Instructions (Signed)
GO TO THE LAB : Get the blood work     GO TO THE FRONT DESK Schedule your next appointment for a  checkup in 3 months  

## 2018-03-17 NOTE — Progress Notes (Signed)
Subjective:    Patient ID: [name], female    DOB: March 19, 1941, 77 y.o.   MRN: [id number]  DOS:  03/17/2018 Type of visit - description :  Interval history: Went to the ER 12/30/2017 after she fell and had a right parietal skull hematoma.  No LOC, had a mild headache.  CT negative except for a small hematoma.  Since that ER visit, no further falls but she feels that her chronic imbalance has gotten worse, has more difficulties "walking straight". Since the fall, had required Advil for headache in 4 occasions,  not the worst HA of her life. Has mild neck pain but is nothing new.  Continue with anesthetic neuropathy, denies any burning but her feet feel cold, her podiatrist recommend physical therapy to prevent falls due to neuropathy.  Since he is here, we address all her issues: DM, good compliance of medication, ambulatory CBGs regularly check, last time was 120 HTN: Good medication compliance, no ambulatory BPs   Review of Systems No dizziness per se just imbalance Mild cough for 1 week, no fever chills, + clear sputum. Mild  blurred vision . no diplopia  Past Medical History:  Diagnosis Date  . Allergic rhinitis   . Asthma    dx in adulthood (77 y/o) PFT 09/08/09 FEV1 1.70/81%; FEV1/FVC 0.75; +resp to dilartor; NI LV/DLCO  . Breast CA ([place])    hx of bilateral diagnosied in 2006, recurred 2011- [name]  . Chronic constipation   . Colonic polyp   . COPD (chronic obstructive pulmonary disease) ([place])   . Diabetes mellitus   . Dislocation of right elbow 2016  . HTN (hypertension) 11-2011  . Hyperlipidemia   . OSA (obstructive sleep apnea)    CPAP intolerant  . Osteopenia   . Peripheral neuropathy   . 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 Urinary retention    During massive constipation (pelvic exam, suspect 8cm or more in diameter)    Past Surgical History:  Procedure Laterality Date  . ANAL RECTAL MANOMETRY N/A 04/23/2016   Procedure: ANO RECTAL MANOMETRY;  Surgeon: [name], MD;  Location: [facility];  Service: Endoscopy;  Laterality: N/A;  . APPENDECTOMY     08/2004  . cyct on spine      1967  . ELBOW SURGERY Right 2016   DISLOCATION  . EXPLORATORY LAPAROTOMY    . FLEXIBLE SIGMOIDOSCOPY N/A 01/31/2013   Procedure: FLEXIBLE SIGMOIDOSCOPY;  Surgeon: [name], MD;  Location: [facility] ENDOSCOPY;  Service: Endoscopy;  Laterality: N/A;  unprepped with sedation  . IR ANGIOGRAM PELVIS SELECTIVE OR SUPRASELECTIVE  05/22/2017  . IR ANGIOGRAM SELECTIVE EACH ADDITIONAL VESSEL  05/22/2017  . IR RADIOLOGIST EVAL & MGMT  04/23/2017  . IR RADIOLOGIST EVAL & MGMT  06/26/2017  . IR US GUIDE BX ASP/DRAIN  06/14/2017  . IR US GUIDE VASC ACCESS LEFT  05/22/2017  . lypoma removed from back-benign 2005     2005  . MASTECTOMY  2006   bilateral  . MASTECTOMY    . Pilonidal Cyst Surgery     ? of  . removal of boils     1970/1980    Social History   Socioeconomic History  . Marital status: Widowed    Spouse name: Not on file  . Number of children: 2  . Years of education: Not on file  . Highest education level: Not on file  Occupational History  . Occupation: retired    Fish farm manager: RETIRED  Social  Needs  . Financial resource strain: Not on file  . Food insecurity:    Worry: Not on file    Inability: Not on file  . Transportation needs:    Medical: Not on file    Non-medical: Not on file  Tobacco Use  . Smoking status: Former Smoker    Packs/day: 1.00    Years: 4.00    Pack years: 4.00    Types: Cigarettes    Last attempt to quit: 04/01/1968    Years since quitting: 49.9  . Smokeless tobacco: Former Systems developer    Quit date: 12/11/1967  . Tobacco comment: Quit at age 16  Substance and Sexual Activity  . Alcohol use: No  . Drug use: No  . Sexual activity: Not Currently  Lifestyle  . Physical activity:    Days per week: Not on file    Minutes per session: Not on file  . Stress: Not on file  Relationships    . Social connections:    Talks on phone: Not on file    Gets together: Not on file    Attends religious service: Not on file    Active member of club or organization: Not on file    Attends meetings of clubs or organizations: Not on file    Relationship status: Not on file  . Intimate partner violence:    Fear of current or ex partner: Not on file    Emotionally abused: Not on file    Physically abused: Not on file    Forced sexual activity: Not on file  Other Topics Concern  . 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 husband 4-10, lives alone, retired Pharmacist, hospital   2 children    [place] twins        Allergies as of 03/17/2018      Reactions   Peanut-containing Drug Products Swelling   Penicillins Itching   Has patient had a PCN reaction causing immediate rash, facial/tongue/throat swelling, SOB or lightheadedness with hypotension: No Has patient had a PCN reaction causing severe rash involving mucus membranes or skin necrosis: No Has patient had a PCN reaction that required hospitalization: No Has patient had a PCN reaction occurring within the last 10 years: No If all of the above answers are "NO", then may proceed with Cephalosporin use. "outer rectal itching"      Medication List        Accurate as of 03/17/18 11:59 PM. Always use your most recent med list.          albuterol 108 (90 Base) MCG/ACT inhaler Commonly known as:  VENTOLIN HFA INHALE 2 PUFFS INTO THE LUNGS EVERY 4 HOURS AS NEEDED FOR WHEEZING OR SHORTNESS OF BREATH   aspirin EC 81 MG tablet Take 81 mg by mouth daily.   EPINEPHrine 0.3 mg/0.3 mL Soaj injection Commonly known as:  EPIPEN 2-PAK Inject 0.3 mLs (0.3 mg total) into the muscle once.   fexofenadine 180 MG tablet Commonly known as:  ALLEGRA Take 180 mg by mouth daily.   fluticasone furoate-vilanterol 100-25 MCG/INH Aepb Commonly known as:  BREO ELLIPTA Inhale 1 puff then rinse mouth, once daily   glucose blood test strip Commonly  known as:  ONE TOUCH ULTRA TEST Check blood sugars daily.   letrozole 2.5 MG tablet Commonly known as:  FEMARA Take 1 tablet (2.5 mg total) by mouth daily.   magnesium hydroxide 400 MG/5ML suspension Commonly known as:  MILK OF MAGNESIA Take 15 mLs by  mouth daily as needed for mild constipation.   metFORMIN 1000 MG tablet Commonly known as:  GLUCOPHAGE Take 1 tablet (1,000 mg total) by mouth daily.   multivitamin with minerals Tabs tablet Take 1 tablet by mouth daily.   ONE TOUCH LANCETS Misc Use as directed once daily to check blood sugar.  DX E11.9   polyethylene glycol packet Commonly known as:  MIRALAX Take 17 g by mouth daily.   simvastatin 40 MG tablet Commonly known as:  ZOCOR Take 1 tablet (40 mg total) by mouth at bedtime.   triamterene-hydrochlorothiazide 37.5-25 MG tablet Commonly known as:  MAXZIDE-25 Take 1 tablet by mouth daily.          Objective:   Physical Exam BP 126/64 (BP Location: Left Wrist, Patient Position: Sitting, Cuff Size: Small)   Pulse 71   Temp 98 F (36.7 C) (Oral)   Resp 16   Ht 5' 3.5" (1.613 m)   Wt 146 lb 12 oz (66.6 kg)   SpO2 92%   BMI 25.59 kg/m  General:   Well developed, well nourished . NAD.  HEENT:  Normocephalic . Face symmetric, atraumatic Neck: Normal range of motion. Lungs:  CTA B Normal respiratory effort, no intercostal retractions, no accessory muscle use. Heart: RRR,  no murmur.  No pretibial edema bilaterally  Skin: Not pale. Not jaundice Neurologic:  alert & oriented X3.  Speech normal Motor and DTR symmetric. Gait assisted by a cane, seems slt imbalance Psych--  Cognition and judgment appear intact.  Cooperative with normal attention span and concentration.  Behavior appropriate. No anxious or depressed appearing.      Assessment & Plan:   Assessment   DM  DM  Neuropathy (neg sx, numbness) HTN Hyperlipidemia Mild hypercalcemia, normal PTH 09-2014 GI: Chronic, severe constipation:    s/p several colonoscopies, very limited d/p poor prep unable to finish exam . Virtual CT 2011 unrevealing . Usually good response to MOM, admitted 2014 d/t impaction, had a flex sig. Virtual cscope 11-2015 neg GB stones  Pulmonary Dr Annamaria Boots --COPD, asthma --OSA CPAP intolerant --Nodules noted on CT-2017, stable. Osteoporosis  (osteopenia plus vertebral Fx per CT), [facility]as at [facility] 2011 normal DEXA; T score 05/2012 -0.9  T score 05-2014 T score -1.37 Oncology  Breast cancer bilateral 2006 --s/ p B mastectomy   --Recurrence L mastectomy site  2011, s/p excision >> XRT, Letrozole For the next 10 years, end 2021 Diplopia, gait disorder: Saw neurology 2015/ 2016. TSH, myasthenia panel were negative. MRI brain no acute Acute urinary retention 08/22/2015  PLAN: Head injury: Head injury 12/30/2017, CT negative at that time, chronic imbalance worse/increased since then.  Imbalance is likely multifactorial including neuropathy.  She is already doing physical therapy to help balance. Recommend MRI to rule out a SAH.  Continue PT DM: Continue metformin, recent BMP satisfactory, check A1c Neuropathy: Likely due to DM, for completeness we will recheck TSH, also E32 for folic acid HTN: Seems well controlled on Maxide, last BMP okay RTC 3 months

## 2018-03-18 ENCOUNTER — Telehealth: Payer: Self-pay | Admitting: Gastroenterology

## 2018-03-18 NOTE — Assessment & Plan Note (Signed)
PLAN: Head injury: Head injury 12/30/2017, CT negative at that time, chronic imbalance worse/increased since then.  Imbalance is likely multifactorial including neuropathy.  She is already doing physical therapy to help balance. Recommend MRI to rule out a SAH.  Continue PT DM: Continue metformin, recent BMP satisfactory, check A1c Neuropathy: Likely due to DM, for completeness we will recheck TSH, also I50 for folic acid HTN: Seems well controlled on Maxide, last BMP okay RTC 3 months

## 2018-03-18 NOTE — Assessment & Plan Note (Signed)
shingrix # 2 today

## 2018-03-21 ENCOUNTER — Ambulatory Visit: Payer: Medicare Other | Admitting: Internal Medicine

## 2018-03-22 ENCOUNTER — Ambulatory Visit (HOSPITAL_BASED_OUTPATIENT_CLINIC_OR_DEPARTMENT_OTHER)
Admission: RE | Admit: 2018-03-22 | Discharge: 2018-03-22 | Disposition: A | Discharge status: Home / Self Care | Payer: Medicare Other | Source: Ambulatory Visit | Attending: Internal Medicine | Admitting: Internal Medicine

## 2018-03-22 DIAGNOSIS — X58XXXD Exposure to other specified factors, subsequent encounter: Secondary | ICD-10-CM | POA: Diagnosis not present

## 2018-03-22 DIAGNOSIS — S0990XD Unspecified injury of head, subsequent encounter: Secondary | ICD-10-CM | POA: Insufficient documentation

## 2018-03-24 NOTE — Telephone Encounter (Signed)
Records reviewed by Dr. Silverio Decamp and okay to schedule OV.  Spoke w/patient and she will call back to schedule.  Records in Records reviewed folder

## 2018-03-25 ENCOUNTER — Encounter: Payer: Self-pay | Admitting: Gastroenterology

## 2018-04-02 ENCOUNTER — Encounter: Payer: Self-pay | Admitting: Internal Medicine

## 2018-04-02 ENCOUNTER — Ambulatory Visit: Payer: Medicare Other | Admitting: Internal Medicine

## 2018-04-02 VITALS — BP 126/78 | HR 72 | Temp 97.6°F | Resp 16 | Ht 64.0 in | Wt 149.1 lb

## 2018-04-02 DIAGNOSIS — S0990XD Unspecified injury of head, subsequent encounter: Secondary | ICD-10-CM | POA: Diagnosis not present

## 2018-04-02 DIAGNOSIS — G629 Polyneuropathy, unspecified: Secondary | ICD-10-CM | POA: Diagnosis not present

## 2018-04-02 DIAGNOSIS — I10 Essential (primary) hypertension: Secondary | ICD-10-CM

## 2018-04-02 MED ORDER — TRIAMTERENE-HCTZ 37.5-25 MG PO TABS
1.0000 | ORAL_TABLET | Freq: Every day | ORAL | 1 refills | Status: AC
Start: 2018-04-02 — End: ?

## 2018-04-02 NOTE — Patient Instructions (Addendum)
  GO TO THE FRONT DESK Schedule your next appointment for a  Check up in 4 months   

## 2018-04-02 NOTE — Assessment & Plan Note (Signed)
Head injury: See last visit, MRI of the brain results discussed with the patient, no acute findings. Neuropathy: Further labs were negative HTN: Controlled on Maxide.  Refill sent. Imbalance: Chronic issue, on physical therapy.  Encouraged to continue using a cane RTC 4 months

## 2018-04-02 NOTE — Progress Notes (Signed)
Subjective:    Patient ID: [name], female    DOB: 1941-08-23, 77 y.o.   MRN: [id number]  DOS:  04/02/2018 Type of visit - description : acute Interval history: Like to discuss her recent MRI. Imbalance: Ongoing issue, she continue doing physical therapy, has found that by changing her shoes she gets better balance. HTN: Good med compliance, needs refills.   Review of Systems Denies any recent falls Neuropathy symptoms at baseline Occasional diplopia, not a new issue  Past Medical History:  Diagnosis Date  . Allergic rhinitis   . Asthma    dx in adulthood (77 y/o) PFT 09/08/09 FEV1 1.70/81%; FEV1/FVC 0.75; +resp to dilartor; NI LV/DLCO  . Breast CA ([place])    hx of bilateral diagnosied in 2006, recurred 2011- [name]  . Chronic constipation   . Colonic polyp   . COPD (chronic obstructive pulmonary disease) ([place])   . Diabetes mellitus   . Dislocation of right elbow 2016  . HTN (hypertension) 11-2011  . Hyperlipidemia   . OSA (obstructive sleep apnea)    CPAP intolerant  . Osteopenia   . Peripheral neuropathy   . 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 Urinary retention    During massive constipation (pelvic exam, suspect 8cm or more in diameter)    Past Surgical History:  Procedure Laterality Date  . ANAL RECTAL MANOMETRY N/A 04/23/2016   Procedure: ANO RECTAL MANOMETRY;  Surgeon: [name], MD;  Location: [facility] ENDOSCOPY;  Service: Endoscopy;  Laterality: N/A;  . APPENDECTOMY     08/2004  . cyct on spine      1967  . ELBOW SURGERY Right 2016   DISLOCATION  . EXPLORATORY LAPAROTOMY    . FLEXIBLE SIGMOIDOSCOPY N/A 01/31/2013   Procedure: FLEXIBLE SIGMOIDOSCOPY;  Surgeon: [name], MD;  Location: [facility] ENDOSCOPY;  Service: Endoscopy;  Laterality: N/A;  unprepped with sedation  . IR ANGIOGRAM PELVIS SELECTIVE OR SUPRASELECTIVE  05/22/2017  . IR ANGIOGRAM SELECTIVE EACH ADDITIONAL VESSEL  05/22/2017  . IR RADIOLOGIST EVAL &  MGMT  04/23/2017  . IR RADIOLOGIST EVAL & MGMT  06/26/2017  . IR US GUIDE BX ASP/DRAIN  06/14/2017  . IR US GUIDE VASC ACCESS LEFT  05/22/2017  . lypoma removed from back-benign 2005     2005  . MASTECTOMY  2006   bilateral  . MASTECTOMY    . Pilonidal Cyst Surgery     ? of  . removal of boils     1970/1980    Social History   Socioeconomic History  . Marital status: Widowed    Spouse name: Not on file  . Number of children: 2  . Years of education: Not on file  . Highest education level: Not on file  Occupational History  . Occupation: retired    Fish farm manager: RETIRED  Social Needs  . Financial resource strain: Not on file  . Food insecurity:    Worry: Not on file    Inability: Not on file  . Transportation needs:    Medical: Not on file    Non-medical: Not on file  Tobacco Use  . Smoking status: Former Smoker    Packs/day: 1.00    Years: 4.00    Pack years: 4.00    Types: Cigarettes    Last attempt to quit: 04/01/1968    Years since quitting: 50.0  . Smokeless tobacco: Former Systems developer    Quit date: 12/11/1967  . Tobacco comment: Quit at age 51  Substance  and Sexual Activity  . Alcohol use: No  . Drug use: No  . Sexual activity: Not Currently  Lifestyle  . Physical activity:    Days per week: Not on file    Minutes per session: Not on file  . Stress: Not on file  Relationships  . Social connections:    Talks on phone: Not on file    Gets together: Not on file    Attends religious service: Not on file    Active member of club or organization: Not on file    Attends meetings of clubs or organizations: Not on file    Relationship status: Not on file  . Intimate partner violence:    Fear of current or ex partner: Not on file    Emotionally abused: Not on file    Physically abused: Not on file    Forced sexual activity: Not on file  Other Topics Concern  . 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 husband 4-10, lives alone, retired Pharmacist, hospital   2 children      [place] twins        Allergies as of 04/02/2018      Reactions   Peanut-containing Drug Products Swelling   Penicillins Itching   Has patient had a PCN reaction causing immediate rash, facial/tongue/throat swelling, SOB or lightheadedness with hypotension: No Has patient had a PCN reaction causing severe rash involving mucus membranes or skin necrosis: No Has patient had a PCN reaction that required hospitalization: No Has patient had a PCN reaction occurring within the last 10 years: No If all of the above answers are "NO", then may proceed with Cephalosporin use. "outer rectal itching"      Medication List        Accurate as of 04/02/18  4:57 PM. Always use your most recent med list.          albuterol 108 (90 Base) MCG/ACT inhaler Commonly known as:  VENTOLIN HFA INHALE 2 PUFFS INTO THE LUNGS EVERY 4 HOURS AS NEEDED FOR WHEEZING OR SHORTNESS OF BREATH   aspirin EC 81 MG tablet Take 81 mg by mouth daily.   EPINEPHrine 0.3 mg/0.3 mL Soaj injection Commonly known as:  EPIPEN 2-PAK Inject 0.3 mLs (0.3 mg total) into the muscle once.   fexofenadine 180 MG tablet Commonly known as:  ALLEGRA Take 180 mg by mouth daily.   fluticasone furoate-vilanterol 100-25 MCG/INH Aepb Commonly known as:  BREO ELLIPTA Inhale 1 puff then rinse mouth, once daily   glucose blood test strip Commonly known as:  ONE TOUCH ULTRA TEST Check blood sugars daily.   letrozole 2.5 MG tablet Commonly known as:  FEMARA Take 1 tablet (2.5 mg total) by mouth daily.   magnesium hydroxide 400 MG/5ML suspension Commonly known as:  MILK OF MAGNESIA Take 15 mLs by mouth daily as needed for mild constipation.   metFORMIN 1000 MG tablet Commonly known as:  GLUCOPHAGE Take 1 tablet (1,000 mg total) by mouth daily.   multivitamin with minerals Tabs tablet Take 1 tablet by mouth daily.   ONE TOUCH LANCETS Misc Use as directed once daily to check blood sugar.  DX E11.9   polyethylene glycol  packet Commonly known as:  MIRALAX Take 17 g by mouth daily.   simvastatin 40 MG tablet Commonly known as:  ZOCOR Take 1 tablet (40 mg total) by mouth at bedtime.   triamterene-hydrochlorothiazide 37.5-25 MG tablet Commonly known as:  MAXZIDE-25 Take 1 tablet by mouth  daily.          Objective:   Physical Exam BP 126/78 (BP Location: Left Arm, Patient Position: Sitting, Cuff Size: Small)   Pulse 72   Temp 97.6 F (36.4 C) (Oral)   Resp 16   Ht 5\' 4"  (1.626 m)   Wt 149 lb 2 oz (67.6 kg)   SpO2 96%   BMI 25.60 kg/m   General:   Well developed, well nourished . NAD.  HEENT:  Normocephalic . Face symmetric, atraumatic Lungs:  CTA B Normal respiratory effort, no intercostal retractions, no accessory muscle use. Heart: RRR,  no murmur.  No pretibial edema bilaterally  Skin: Not pale. Not jaundice Neurologic:  alert & oriented X3.  Speech normal, walks with some difficulty, assisted by a cane Psych--  Cognition and judgment appear intact.  Cooperative with normal attention span and concentration.  Behavior appropriate. No anxious or depressed appearing.      Assessment & Plan:   Assessment   DM  Neuropathy (neg sx, + numbness) HTN Hyperlipidemia Mild hypercalcemia, normal PTH 09-2014 GI: Chronic, severe constipation:  s/p several colonoscopies, very limited d/p poor prep unable to finish exam . Virtual CT 2011 unrevealing . Usually good response to MOM, admitted 2014 d/t impaction, had a flex sig. Virtual cscope 11-2015 neg GB stones  Pulmonary Dr Annamaria Boots --COPD, asthma --OSA CPAP intolerant --Nodules noted on CT-2017, stable. Osteoporosis  (osteopenia plus vertebral Fx per CT), dexas at [facility] 2011 normal DEXA; T score 05/2012 -0.9  T score 05-2014 T score -1.37 Oncology  Breast cancer bilateral 2006 --s/ p B mastectomy   --Recurrence L mastectomy site  2011, s/p excision >> XRT, Letrozole For the next 10 years, end 2021 Diplopia, gait disorder: Saw  neurology 2015/ 2016. TSH, myasthenia panel were negative. MRI brain no acute Gait d/o: chronic imbalance since ~ 2016 Acute urinary retention 08/22/2015  PLAN: Head injury: See last visit, MRI of the brain results discussed with the patient, no acute findings. Neuropathy: Further labs were negative HTN: Controlled on Maxide.  Refill sent. Imbalance: Chronic issue, on physical therapy.  Encouraged to continue using a cane RTC 4 months

## 2018-04-02 NOTE — Progress Notes (Signed)
Pre visit review using our clinic review tool, if applicable. No additional management support is needed unless otherwise documented below in the visit note. 

## 2018-04-07 ENCOUNTER — Other Ambulatory Visit: Payer: Medicare Other

## 2018-04-07 ENCOUNTER — Ambulatory Visit: Payer: Medicare Other | Admitting: Hematology

## 2018-04-09 NOTE — Progress Notes (Signed)
[facility] Follow Up Visit  IFE VITELLI [id number] [birth year] 77 y.o. 04/10/2018 11:28 PM   Principle Diagnosis:[name] 77 y.o. 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     Prior Therapy: 1. Patient underwent a screening mammogram and abnormalities were detected in her breasts. Biopsy was positive for invasive cancer. She underwent a bilateral mastectomy on 01/30/05 by Dr. Excell Seltzer. 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   2. Patient later had recurrence along the left mastectomy site of invasive ductal carcinoma, lymph nodes were negative and it was excised on 04/18/10 with negative margins, she underwent radiation therapy, followed by adjuvant Letrozole   Current therapy:  Letrozole 2.52m daily since 06/2010  Interim History:  Joanne Nurse89y.o. female with h/o invasive ductal carcinoma who is here today for f/u. She reports she is doing okay overall. She continues on letrozole, with good tolerance. She is not taking calcium at this time and she does occasionally consume cheese balls.   Labs today, 04/10/18 show: CBC WNL, CMP with Na at 135, Chloride at 97, CO2 at 31, Ca at 10.8, GFR non at 50, and GFR at 58, otherwise WNL.   Since her last visit to the office, she underwent MR brain without contrast on 03/22/2018 with results of: No acute intracranial process. Moderate chronic small vessel ischemic disease.  On review of systems, she reports balance issues. She notes that she still has mild balance issues, however she is ambulating with a cane at home as well as she has physical therapy coming to her home. She is also wearing tennis shoes to aid with ambulation. She has a walker at home, however, she notes that the  walker is often cumbersome and she doesn't like to use it while out. She will obtain another colonoscopy due to her side pain that her PCP recommended. she denies back pain, abdominal pain, and any other symptoms.    Past Medical History:  Diagnosis Date  . Allergic rhinitis   . Asthma    dx in adulthood ((77y/o) PFT 09/08/09 FEV1 1.70/81%; FEV1/FVC 0.75; +resp to dilartor; NI LV/DLCO  . [place]    hx of bilateral diagnosied in 2006, recurred 2011- [name] . Chronic constipation   . Colonic polyp   . COPD (chronic obstructive pulmonary disease) [place]   . Diabetes mellitus   . Dislocation of right elbow 2016  . HTN (hypertension) 11-2011  . Hyperlipidemia   . OSA (obstructive sleep apnea)    CPAP intolerant  . Osteopenia   . Peripheral neuropathy   . 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 Urinary retention    During massive constipation (pelvic exam, suspect 8cm or more in diameter)   Medications:  Current Outpatient Medications  Medication Sig Dispense Refill  . albuterol (VENTOLIN HFA) 108 (90 Base) MCG/ACT inhaler INHALE 2 PUFFS INTO THE LUNGS EVERY 4 HOURS AS NEEDED FOR WHEEZING OR SHORTNESS OF BREATH 18 g 12  . aspirin EC 81 MG tablet Take 81 mg by mouth daily.    . fexofenadine (ALLEGRA) 180 MG tablet Take 180 mg by mouth daily.     . fluticasone furoate-vilanterol (BREO ELLIPTA) 100-25 MCG/INH AEPB Inhale 1 puff then rinse mouth, once daily 60 each 11  . glucose  blood (ONE TOUCH ULTRA TEST) test strip Check blood sugars daily. 100 each 12  . letrozole (FEMARA) 2.5 MG tablet Take 1 tablet (2.5 mg total) by mouth daily. 90 tablet 3  . magnesium hydroxide (MILK OF MAGNESIA) 400 MG/5ML suspension Take 15 mLs by mouth daily as needed for mild constipation.    . metFORMIN (GLUCOPHAGE) 1000 MG tablet Take 1 tablet (1,000 mg total) by mouth daily. 90 tablet 1  . Multiple Vitamin (MULTIVITAMIN WITH MINERALS) TABS tablet Take 1 tablet by mouth daily.     . ONE TOUCH LANCETS MISC Use as directed once daily to check blood sugar.  DX E11.9 200 each 2  . polyethylene glycol (MIRALAX) packet Take 17 g by mouth daily. 14 each 0  . simvastatin (ZOCOR) 40 MG tablet Take 1 tablet (40 mg total) by mouth at bedtime. 90 tablet 1  . triamterene-hydrochlorothiazide (MAXZIDE-25) 37.5-25 MG tablet Take 1 tablet by mouth daily. 90 tablet 1  . EPINEPHrine (EPIPEN 2-PAK) 0.3 mg/0.3 mL IJ SOAJ injection Inject 0.3 mLs (0.3 mg total) into the muscle once. (Patient not taking: Reported on 03/14/2018) 2 Device 1   No current facility-administered medications for this visit.      Allergies:  Allergies  Allergen Reactions  . Peanut-Containing Drug Products Swelling  . Penicillins Itching    Has patient had a PCN reaction causing immediate rash, facial/tongue/throat swelling, SOB or lightheadedness with hypotension: No Has patient had a PCN reaction causing severe rash involving mucus membranes or skin necrosis: No Has patient had a PCN reaction that required hospitalization: No Has patient had a PCN reaction occurring within the last 10 years: No If all of the above answers are "NO", then may proceed with Cephalosporin use.  "outer rectal itching"    Medical History: Past Medical History:  Diagnosis Date  . Allergic rhinitis   . Asthma    dx in adulthood (77 y/o) PFT 09/08/09 FEV1 1.70/81%; FEV1/FVC 0.75; +resp to dilartor; NI LV/DLCO  . Breast CA ([place])    hx of bilateral diagnosied in 2006, recurred 2011- [name]  . Chronic constipation   . Colonic polyp   . COPD (chronic obstructive pulmonary disease) ([place])   . Diabetes mellitus   . Dislocation of right elbow 2016  . HTN (hypertension) 11-2011  . Hyperlipidemia   . OSA (obstructive sleep apnea)    CPAP intolerant  . Osteopenia   . Peripheral neuropathy   . 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 Urinary retention    During massive constipation (pelvic exam, suspect 8cm or  more in diameter)    Surgical History:  Past Surgical History:  Procedure Laterality Date  . ANAL RECTAL MANOMETRY N/A 04/23/2016   Procedure: ANO RECTAL MANOMETRY;  Surgeon: [name], MD;  Location: [facility] END[facility]COPY;  Service: Endoscopy;  Laterality: N/A;  . APPENDECTOMY     08/2004  . cyct on spine      1967  . ELBOW SURGERY Right 2016   DISLOCATION  . EXPLORATORY LAPAROTOMY    . FLEXIBLE SIGMOIDOSCOPY N/A 01/31/2013   Procedure: FLEXIBLE SIGMOIDOSCOPY;  Surgeon: [name], MD;  Location: [facility] ENDOSCOPY;  Service: Endoscopy;  Laterality: N/A;  unprepped with sedation  . IR ANGIOGRAM PELVIS SELECTIVE OR SUPRASELECTIVE  05/22/2017  . IR ANGIOGRAM SELECTIVE EACH ADDITIONAL VESSEL  05/22/2017  . IR RADIOLOGIST EVAL & MGMT  04/23/2017  . IR RADIOLOGIST EVAL & MGMT  06/26/2017  . IR US GUIDE BX ASP/DRAIN  06/14/2017  .  IR US GUIDE VASC ACCESS LEFT  05/22/2017  . lypoma removed from back-benign 2005     2005  . MASTECTOMY  2006   bilateral  . MASTECTOMY    . Pilonidal Cyst Surgery     ? of  . removal of boils     1970/1980    Review of Systems:  A 10 point review of systems was conducted and is otherwise negative except for what is noted above.    HEALTH MAINTENANCE:  Mammogram 05/06/12, s/p bialteral mastectomy Colonoscopy 12/10/04, flex sig on 01/2013  Bone Density 05/28/2016: Osteopenia, T score -1.5 at total left hip, slightly improved compared to 01/29/2014. Her estimated 10 year probability of major osteoporotic fracture 6.3% and hip fracture 0.7%   Physical Exam:  Blood pressure 110/62, pulse 77, temperature 98.8 F (37.1 C), temperature source Oral, resp. rate 18, height 5' 4" (1.626 m), weight 149 lb (67.6 kg), SpO2 99 %.   GENERAL: Patient is a well appearing female in no acute distress HEENT:  Sclerae anicteric.  Oropharynx clear and moist. No ulcerations or evidence of oropharyngeal candidiasis. Neck is supple.  NODES:  No cervical, supraclavicular, or axillary  lymphadenopathy palpated.  BREAST EXAM: s/p bilateral mastectomy, no nodularity on chest wall, no sign of recurrence. LUNGS:  Clear to auscultation bilaterally.  No wheezes or rhonchi.  Rectum: 2 cm subcutaneous nodule, smooth, It is tender in the perianal area on right side between labia and anus.  HEART:  Regular rate and rhythm. No murmur appreciated. ABDOMEN:  no palpable mass or organomegaly, LLQ tender to palpation; this is likely due to constipation MSK:  No focal spinal tenderness to palpation. She did have difficulty with range of motion in her left shoulder.  Full range of motion in right upper extremity. EXTREMITIES:  No peripheral edema.   SKIN:  Clear with no obvious rashes or skin changes. No nail dyscrasia. NEURO:  Nonfocal. Well oriented.  Appropriate affect. ECOG PERFORMANCE STATUS: 1 - Symptomatic but completely ambulatory   Lab Results: CBC Latest Ref Rng & Units 04/10/2018 12/24/2017 10/07/2017  WBC 3.9 - 10.3 K/uL 9.1 7.4 8.3  Hemoglobin 11.6 - 15.9 g/dL 14.1 14.8 14.4  Hematocrit 34.8 - 46.6 % 41.9 43.8 42.9  Platelets 145 - 400 K/uL 299 270 298    CMP Latest Ref Rng & Units 04/10/2018 12/24/2017 10/07/2017  Glucose 70 - 140 mg/dL 85 87 109  BUN 7 - 26 mg/dL 23 16 13.7  Creatinine 0.60 - 1.10 mg/dL 1.06 0.82 0.9  Sodium 136 - 145 mmol/L 135(L) 139 138  Potassium 3.5 - 5.1 mmol/L 3.8 3.5 3.5  Chloride 98 - 109 mmol/L 97(L) 101 -  CO2 22 - 29 mmol/L 31(H) 28 27  Calcium 8.4 - 10.4 mg/dL 10.8(H) 10.2 10.3  Total Protein 6.4 - 8.3 g/dL 7.5 7.4 7.4  Total Bilirubin 0.2 - 1.2 mg/dL 0.6 0.9 0.89  Alkaline Phos 40 - 150 U/L 52 57 53  AST 5 - 34 U/L _0 ALT 0 - 55 U/L _1 PATH RESULTS: Diagnosis 06/14/2017 Soft tissue, biopsy, right perianal/buttocks - FINDINGS CONSISTENT WITH SOLITARY FIBROUS TUMOR, SEE COMMENT. Microscopic Comment There is a bland spindle cell proliferation with a haphazard pattern and numerous vessels. There is no significant atypia or  necrosis. Immunohistochemistry reveals the spindle cells are positive for CD34 and bcl-2. CD34 and CD31 highlight vessels. SMA, desmin, HHV-8, and CD99 are negative. 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 Dr. Lyndon Code has reviewed the case. The case was called to Dr. Burr Medico on 06/19/2017.  RADIOGRAPHIC:  MR Brain without contrast, 03/22/2018 IMPRESSION: 1. No acute intracranial process. 2. Moderate chronic small vessel ischemic disease.  CT AP 04/04/17 IMPRESSION: 1. Rectal impaction with stool and gas retention throughout the dilated colon, also seen on multiple prior studies. 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 2. 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 This may be amenable to percutaneous treatment and the patient could be evaluated in the IR Clinic. 3. Cholelithiasis.  PET 06/21/2017 IMPRESSION: 1. Mild metabolic activity associated the perianal thickening and gluteal nodularity is favored inflammatory rather than malignant. (Correlate with pathology from biopsy, unavailable) 2. No evidence of recurrent breast cancer on the at skullbase to thigh FDG scan. 3. Small pulmonary nodules are below the size for PET characterization. Favor benign. If Patient has smoking history recommend follow-up CT in 12 months.  Assessment and Plan: Gracelyn Snyder 77 y.o. female with  1.  ER/PR positive invasive mammary carcinoma in left breast in 2006 with local recurrence in 2011.   -She is status post bilateral mastectomy, on adjuvant letrozole started in 06/2010. She is tolerating well without significant side effects. -Giving her local recurrent disease, I previously discussed extending her letrozole for longer period of time, likely 10 years. She agreed. She has been on it since August 2011. -She previously developed abdominal bloating and constipation lately, with weight loss. She underwent CT of abdomen and pelvis which was unremarkable . No  concerns of cancer recurrence. -Due to her recent right perianal lesion, she underwent a PET scan on 06/21/2017, which showed mild hypermetabolism in the right perianal mass (biopsy confirmed fibrous tumor), no other evidence of breast cancer recurrence or metastasis. -We'll continue letrozole, she is tolerating well. -Labs today, 04/10/18 show: CBC WNL, CMP with Na at 135, Chloride at 97, CO2 at 31, Ca at 10.8, GFR non at 50, and GFR at 58, otherwise WNL.  -I reviewed labs today with the patient.  -She is clinically doing well. Her breast exam was unremarkable. There is no concern for recurrence. -Continue breast cancer surveillance -Lab and f/u in one year  -DEXA in 05/2018 at [facility]   2. Right perianal fibrous tumor -We previously discussed her biopsy results, which showed fibrous tumor -We discussed this is a benign tumor, surgical resection is usually preferred, if it is feasible. -I discussed her case in our GI tumor Board, and a surgical resection was not recommenced at this time. -I palpated the 2.5cm perirectal mass in today's exam (10/07/17), she he is asymptomatic -I discussed that if this nodule is increasing in size or impacting her bowel movement she should contact us.  -this has been stable on clinical exam   3. Hypercalcemia -She will follow-up with her primary care physician -She knows to drink fluids adequately and avoid dehydration -Avoid calcium supplement -PTH was normal in 09/2014 -continue monitoring -resolved as of 10/07/17, calcium at 10.3 -Continue with a multivitamin  4. Osteopenia -she is on multivitamin  -She is not qualified for biphosphonate or Prolia due to her risk of fracture   -Continue bone density scan every 2 years, she is due in 2019 -Will get last bone density scan form [facility] in June or July 2019  5. Hypertension, diabetes, COPD -She'll continue follow-up with primary care physician -She has had a mild clear sputum production lately, no fever,  no exam was unremarkable, no concerning for  COPD exacerbation -She will see her pulmonologist in April  6. Balance issues -MR Brain without contrast on 03/22/2018 showed: No acute intracranial process. Moderate chronic small vessel ischemic disease.  -I reviewed the MR Brain with the patient today -Advised the patient to continue to use her cane to aid with ambulation -Patient to continue with physical therapy per patient's PCP   PLAN: -Continue letrozole for 2 more years -Lab and f/u in one year  -[facility] in 05/2018 [facility]    She knows to call us in the interim for any questions or concerns. We can certainly see her sooner if needed.  I spent 15 minutes counseling the patient face to face.  The total time spent in the appointment was 20 minutes.  This document serves as a record of services personally performed by Truitt Merle, MD. It was created on her behalf by Steva Colder, a trained medical scribe. The creation of this record is based on the scribe's personal observations and the provider's statements to them.   I have reviewed the above documentation for accuracy and completeness, and I agree with the above.   Truitt Merle  04/10/2018

## 2018-04-10 ENCOUNTER — Inpatient Hospital Stay: Payer: Medicare Other | Attending: Hematology

## 2018-04-10 ENCOUNTER — Inpatient Hospital Stay (HOSPITAL_BASED_OUTPATIENT_CLINIC_OR_DEPARTMENT_OTHER): Payer: Medicare Other | Admitting: Hematology

## 2018-04-10 ENCOUNTER — Telehealth: Payer: Self-pay | Admitting: Hematology

## 2018-04-10 VITALS — BP 110/62 | HR 77 | Temp 98.8°F | Resp 18 | Ht 64.0 in | Wt 149.0 lb

## 2018-04-10 DIAGNOSIS — Z9013 Acquired absence of bilateral breasts and nipples: Secondary | ICD-10-CM | POA: Diagnosis not present

## 2018-04-10 DIAGNOSIS — J449 Chronic obstructive pulmonary disease, unspecified: Secondary | ICD-10-CM | POA: Diagnosis not present

## 2018-04-10 DIAGNOSIS — E1140 Type 2 diabetes mellitus with diabetic neuropathy, unspecified: Secondary | ICD-10-CM | POA: Insufficient documentation

## 2018-04-10 DIAGNOSIS — Z170 Estrogen receptor positive status [ER+]: Secondary | ICD-10-CM | POA: Insufficient documentation

## 2018-04-10 DIAGNOSIS — E785 Hyperlipidemia, unspecified: Secondary | ICD-10-CM | POA: Diagnosis not present

## 2018-04-10 DIAGNOSIS — C50912 Malignant neoplasm of unspecified site of left female breast: Secondary | ICD-10-CM | POA: Diagnosis present

## 2018-04-10 DIAGNOSIS — I10 Essential (primary) hypertension: Secondary | ICD-10-CM

## 2018-04-10 DIAGNOSIS — M8580 Other specified disorders of bone density and structure, unspecified site: Secondary | ICD-10-CM

## 2018-04-10 DIAGNOSIS — Z79899 Other long term (current) drug therapy: Secondary | ICD-10-CM

## 2018-04-10 DIAGNOSIS — Z79811 Long term (current) use of aromatase inhibitors: Secondary | ICD-10-CM

## 2018-04-10 DIAGNOSIS — E1142 Type 2 diabetes mellitus with diabetic polyneuropathy: Secondary | ICD-10-CM | POA: Diagnosis not present

## 2018-04-10 DIAGNOSIS — E2839 Other primary ovarian failure: Secondary | ICD-10-CM

## 2018-04-10 LAB — CBC WITH DIFFERENTIAL/PLATELET
Basophils Absolute: 0.1 10*3/uL (ref 0.0–0.1)
Basophils Relative: 1 %
EOS ABS: 0.3 10*3/uL (ref 0.0–0.5)
EOS PCT: 3 %
HCT: 41.9 % (ref 34.8–46.6)
Hemoglobin: 14.1 g/dL (ref 11.6–15.9)
LYMPHS ABS: 1.4 10*3/uL (ref 0.9–3.3)
LYMPHS PCT: 16 %
MCH: 30.6 pg (ref 25.1–34.0)
MCHC: 33.6 g/dL (ref 31.5–36.0)
MCV: 91 fL (ref 79.5–101.0)
MONO ABS: 0.8 10*3/uL (ref 0.1–0.9)
MONOS PCT: 8 %
Neutro Abs: 6.5 10*3/uL (ref 1.5–6.5)
Neutrophils Relative %: 72 %
PLATELETS: 299 10*3/uL (ref 145–400)
RBC: 4.61 MIL/uL (ref 3.70–5.45)
RDW: 14 % (ref 11.2–14.5)
WBC: 9.1 10*3/uL (ref 3.9–10.3)

## 2018-04-10 LAB — COMPREHENSIVE METABOLIC PANEL
ALT: 19 U/L (ref 0–55)
ANION GAP: 7 (ref 3–11)
AST: 22 U/L (ref 5–34)
Albumin: 4.5 g/dL (ref 3.5–5.0)
Alkaline Phosphatase: 52 U/L (ref 40–150)
BUN: 23 mg/dL (ref 7–26)
CALCIUM: 10.8 mg/dL — AB (ref 8.4–10.4)
CHLORIDE: 97 mmol/L — AB (ref 98–109)
CO2: 31 mmol/L — ABNORMAL HIGH (ref 22–29)
CREATININE: 1.06 mg/dL (ref 0.60–1.10)
GFR, EST AFRICAN AMERICAN: 58 mL/min — AB (ref >60)
GFR, EST NON AFRICAN AMERICAN: 50 mL/min — AB (ref >60)
Glucose, Bld: 85 mg/dL (ref 70–140)
Potassium: 3.8 mmol/L (ref 3.5–5.1)
Sodium: 135 mmol/L — ABNORMAL LOW (ref 136–145)
Total Bilirubin: 0.6 mg/dL (ref 0.2–1.2)
Total Protein: 7.5 g/dL (ref 6.4–8.3)

## 2018-04-10 MED ORDER — LETROZOLE 2.5 MG PO TABS: 2.5000 mg | ORAL_TABLET | Freq: Every day | ORAL | 3 refills | Status: DC

## 2018-04-10 NOTE — Telephone Encounter (Signed)
Appointments scheduled AVS/Calendar printed per 5/2 los °

## 2018-04-11 ENCOUNTER — Encounter: Payer: Self-pay | Admitting: Hematology

## 2018-04-24 LAB — HM DIABETES EYE EXAM

## 2018-05-01 ENCOUNTER — Other Ambulatory Visit: Payer: Self-pay | Admitting: Internal Medicine

## 2018-05-21 ENCOUNTER — Encounter: Payer: Self-pay | Admitting: Gastroenterology

## 2018-05-21 ENCOUNTER — Ambulatory Visit: Payer: Medicare Other | Admitting: Gastroenterology

## 2018-05-21 ENCOUNTER — Ambulatory Visit (INDEPENDENT_AMBULATORY_CARE_PROVIDER_SITE_OTHER)
Admission: RE | Admit: 2018-05-21 | Discharge: 2018-05-21 | Disposition: A | Discharge status: Home / Self Care | Payer: Medicare Other | Source: Ambulatory Visit | Attending: Gastroenterology | Admitting: Gastroenterology

## 2018-05-21 VITALS — BP 124/80 | HR 96 | Ht 63.0 in | Wt 144.1 lb

## 2018-05-21 DIAGNOSIS — R634 Abnormal weight loss: Secondary | ICD-10-CM

## 2018-05-21 DIAGNOSIS — R159 Full incontinence of feces: Secondary | ICD-10-CM

## 2018-05-21 DIAGNOSIS — R630 Anorexia: Secondary | ICD-10-CM

## 2018-05-21 DIAGNOSIS — K5909 Other constipation: Secondary | ICD-10-CM | POA: Diagnosis not present

## 2018-05-21 NOTE — Patient Instructions (Signed)
Your provider has requested that you have an abdominal x ray before leaving today. Please go to the basement floor to our Radiology department for the test. We will contact you with results and further instructions if needed  We will send Amitiza to your pharmacy  We will refer you to Pelvic Floor therapy , they will contact you with that appointment  We will discuss possible colonoscopy at next office appointment   If you are age 76 or older, your body mass index should be between 23-30. Your Body mass index is 25.53 kg/m. If this is out of the aforementioned range listed, please consider follow up with your Primary Care Provider.  If you are age 50 or younger, your body mass index should be between 19-25. Your Body mass index is 25.53 kg/m. If this is out of the aformentioned range listed, please consider follow up with your Primary Care Provider.

## 2018-05-21 NOTE — Progress Notes (Signed)
[name]    [id number]    [birth year]  Primary Care Physician:Paz, Alda Berthold, MD  Referring Physician: Colon Branch, MD [name] [place], [place] [address]  Chief complaint: Constipation  HPI: 77 year old female here for new patient with complaints of worsening constipation left lower quadrant abdominal pain unintentional weight loss.  She has history of chronic constipation and has tried multiple over-the-counter laxatives with minimal improvement.  She is currently taking milk of magnesia 1 capful daily and sometimes has to takes 2 cups with bowel movement about once a week.  Last BM was 5 days ago.  She has a sensation of fullness and discomfort in the left lower quadrant when she has not had a bowel movement for more than a week.  Denies any blood in stool or rectal bleeding.  No anal discomfort.  Occasionally she has fecal incontinence while asleep.  She has decreased appetite and also losing weight unintentionally, feels full when she has not had a bowel movement for more than 5 to 7 days. Patient was previously followed by [facility], flexible sigmoidoscopy February 2014 with poor prep.  Prior to that she did have attempted colonoscopies with poor prep as well on 2 occasions.   Outpatient Encounter Medications as of 05/21/2018  Medication Sig  . albuterol (VENTOLIN HFA) 108 (90 Base) MCG/ACT inhaler INHALE 2 PUFFS INTO THE LUNGS EVERY 4 HOURS AS NEEDED FOR WHEEZING OR SHORTNESS OF BREATH  . aspirin EC 81 MG tablet Take 81 mg by mouth daily.  . fexofenadine (ALLEGRA) 180 MG tablet Take 180 mg by mouth daily.   . fluticasone furoate-vilanterol (BREO ELLIPTA) 100-25 MCG/INH AEPB Inhale 1 puff then rinse mouth, once daily  . glucose blood (ONE TOUCH ULTRA TEST) test strip Check blood sugars daily.  Marland Kitchen letrozole (FEMARA) 2.5 MG tablet Take 1 tablet (2.5 mg total) by mouth daily.  . magnesium hydroxide (MILK OF MAGNESIA) 400 MG/5ML suspension Take 15 mLs  by mouth daily as needed for mild constipation.  . metFORMIN (GLUCOPHAGE) 1000 MG tablet Take 1 tablet (1,000 mg total) by mouth daily.  . Multiple Vitamin (MULTIVITAMIN WITH MINERALS) TABS tablet Take 1 tablet by mouth daily.  . ONE TOUCH LANCETS MISC Use as directed once daily to check blood sugar.  DX E11.9  . polyethylene glycol (MIRALAX) packet Take 17 g by mouth daily.  . simvastatin (ZOCOR) 40 MG tablet Take 1 tablet (40 mg total) by mouth at bedtime.  . triamterene-hydrochlorothiazide (MAXZIDE-25) 37.5-25 MG tablet Take 1 tablet by mouth daily.  Marland Kitchen EPINEPHrine (EPIPEN 2-PAK) 0.3 mg/0.3 mL IJ SOAJ injection Inject 0.3 mLs (0.3 mg total) into the muscle once. (Patient not taking: Reported on 03/14/2018)   No facility-administered encounter medications on file as of 05/21/2018.     Allergies as of 05/21/2018 - Review Complete 05/21/2018  Allergen Reaction Noted  . Peanut-containing drug products Swelling 06/09/2012  . Penicillins Itching 08/22/2005    Past Medical History:  Diagnosis Date  . Allergic rhinitis   . Anal fissure   . Asthma    dx in adulthood (77 y/o) PFT 09/08/09 FEV1 1.70/81%; FEV1/FVC 0.75; +resp to dilartor; NI LV/DLCO  . Breast CA ([place])    hx of bilateral diagnosied in 2006, recurred 2011- [name]  . Chronic constipation   . Colonic polyp   . COPD (chronic obstructive pulmonary disease) ([place])   . Diabetes mellitus   .  Dislocation of right elbow 2016  . HTN (hypertension) 11-2011  . Hyperlipidemia   . OSA (obstructive sleep apnea)    CPAP intolerant  . Osteopenia   . Peripheral neuropathy   . 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 Urinary retention    During massive constipation (pelvic exam, suspect 8cm or more in diameter)    Past Surgical History:  Procedure Laterality Date  . ANAL RECTAL MANOMETRY N/A 04/23/2016   Procedure: ANO RECTAL MANOMETRY;  Surgeon: [name], MD;  Location: [facility]COPY;  Service: Endoscopy;   Laterality: N/A;  . APPENDECTOMY     08/2004  . cyct on spine      1967  . ELBOW SURGERY Right 2016   DISLOCATION  . EXPLORATORY LAPAROTOMY    . FLEXIBLE SIGMOIDOSCOPY N/A 01/31/2013   Procedure: FLEXIBLE SIGMOIDOSCOPY;  Surgeon: [name], MD;  Location: [facility] ENDOSCOPY;  Service: Endoscopy;  Laterality: N/A;  unprepped with sedation  . IR ANGIOGRAM PELVIS SELECTIVE OR SUPRASELECTIVE  05/22/2017  . IR ANGIOGRAM SELECTIVE EACH ADDITIONAL VESSEL  05/22/2017  . IR RADIOLOGIST EVAL & MGMT  04/23/2017  . IR RADIOLOGIST EVAL & MGMT  06/26/2017  . IR US GUIDE BX ASP/DRAIN  06/14/2017  . IR US GUIDE VASC ACCESS LEFT  05/22/2017  . lypoma removed from back-benign 2005     2005  . MASTECTOMY Bilateral 2006  . Pilonidal Cyst Surgery     ? of  . removal of boils     1970/1980    Family History  Problem Relation Age of Onset  . COPD Mother        smoker  . Hypertension Mother   . Lung cancer Father        died age 54  . Breast cancer Sister   . Diabetes Brother   . Breast cancer Maternal Grandmother   . Schizophrenia Son   . Autism Grandchild        x 2 grandsons  . Colon cancer Neg Hx   . Heart attack Neg Hx        no h/o early diseae    Social History   Socioeconomic History  . Marital status: Widowed    Spouse name: Not on file  . Number of children: 2  . Years of education: Not on file  . Highest education level: Not on file  Occupational History  . Occupation: retired    Fish farm manager: RETIRED  Social Needs  . Financial resource strain: Not on file  . Food insecurity:    Worry: Not on file    Inability: Not on file  . Transportation needs:    Medical: Not on file    Non-medical: Not on file  Tobacco Use  . Smoking status: Former Smoker    Packs/day: 1.00    Years: 4.00    Pack years: 4.00    Types: Cigarettes    Last attempt to quit: 04/01/1968    Years since quitting: 50.1  . Smokeless tobacco: Never Used  . Tobacco comment: Quit at age 37  Substance and  Sexual Activity  . Alcohol use: No  . Drug use: No  . Sexual activity: Not Currently  Lifestyle  . Physical activity:    Days per week: Not on file    Minutes per session: Not on file  . Stress: Not on file  Relationships  . Social connections:    Talks on phone: Not on file    Gets together: Not on file  Attends religious service: Not on file    Active member of club or organization: Not on file    Attends meetings of clubs or organizations: Not on file    Relationship status: Not on file  . Intimate partner violence:    Fear of current or ex partner: Not on file    Emotionally abused: Not on file    Physically abused: Not on file    Forced sexual activity: Not on file  Other Topics Concern  . 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 husband 4-10, lives alone, retired Pharmacist, hospital   2 children    [place] twins        Review of systems: Review of Systems  Constitutional: Negative for fever and chills.  HENT: Negative.   Eyes: Negative for blurred vision.  Respiratory: Negative for cough, shortness of breath and wheezing.   Cardiovascular: Negative for chest pain and palpitations.  Gastrointestinal: as per HPI Genitourinary: Negative for dysuria, urgency, frequency and hematuria.  Musculoskeletal: Positive for myalgias, back pain and joint pain.  Skin: Negative for itching and rash.  Neurological: Negative for dizziness, tremors, focal weakness, seizures and loss of consciousness.  Endo/Heme/Allergies: Positive for seasonal allergies.  Psychiatric/Behavioral: Negative for depression, suicidal ideas and hallucinations.  All other systems reviewed and are negative.   Physical Exam: Vitals:   05/21/18 0825  BP: 124/80  Pulse: 96   Body mass index is 25.53 kg/m. Gen:      No acute distress HEENT:  EOMI, sclera anicteric Neck:     No masses; no thyromegaly Lungs:    Clear to auscultation bilaterally; normal respiratory effort CV:         Regular rate and  rhythm; no murmurs Abd:      + bowel sounds; soft, non-tender; no palpable masses, no distension Ext:    No edema; adequate peripheral perfusion Skin:      Warm and dry; no rash Neuro: alert and oriented x 3 Psych: normal mood and affect  Data Reviewed:  Reviewed labs, radiology imaging, old records and pertinent past GI work up   Assessment and Plan/Recommendations:  77 year old female with history of chronic constipation, occasional fecal incontinence here to establish care Start Amitiza 24 mcg twice daily Increase dietary fluid and fiber intake Referred to pelvic floor physical therapy for biofeedback for 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 and also improve sphincter tone  Patient never had complete colonoscopy with adequate prep for colorectal cancer screening Will schedule it at next visit once constipation improves  Obtain abdominal x-ray and if has evidence of significantly increased stool burden we will do a bowel purge  Loss of appetite and weight loss: Could be secondary to severe constipation Advised patient to eat small frequent meals Increase fluid intake Will consider further work-up if continues to persist despite improvement of constipation  Greater than 50% of the time used for counseling as well as treatment plan and follow-up. She had multiple questions which were answered to her satisfaction  K. Denzil Magnuson , MD [phone]    CC: Colon Branch, MD

## 2018-05-23 ENCOUNTER — Other Ambulatory Visit: Payer: Self-pay

## 2018-05-23 MED ORDER — LUBIPROSTONE 24 MCG PO CAPS: 24.0000 ug | ORAL_CAPSULE | Freq: Two times a day (BID) | ORAL | 2 refills | Status: DC

## 2018-05-23 MED ORDER — PEG 3350-KCL-NA BICARB-NACL 420 G PO SOLR: 4000.0000 mL | Freq: Once | ORAL | 0 refills | Status: AC

## 2018-05-26 ENCOUNTER — Telehealth: Payer: Self-pay | Admitting: Gastroenterology

## 2018-05-26 MED ORDER — LUBIPROSTONE 8 MCG PO CAPS: 8.0000 ug | ORAL_CAPSULE | Freq: Two times a day (BID) | ORAL | 3 refills | Status: DC

## 2018-05-26 NOTE — Telephone Encounter (Signed)
Spoke with pt and she is aware, script sent to pharmacy. 

## 2018-05-26 NOTE — Telephone Encounter (Signed)
She did have significant stool burden on abd x-ray. We can decrease the dose to Amitiza 8cg twice daily instead. Symptoms should improve in next 1-2 weeks. Please send Rx for Amitiza 8cg BID X 30 days with 3 refills. Thanks

## 2018-05-26 NOTE — Telephone Encounter (Signed)
Pt states she is having 3-4 diarrhea stools a day now since she started the Amitiza 65mcg BID. Pt thought it was still from the bowel prep she did. Discussed with her that it should not still be the prep. She feels urgency and then has to make it to the bathroom quickly. Please advise.

## 2018-06-04 ENCOUNTER — Telehealth: Payer: Self-pay | Admitting: *Deleted

## 2018-06-04 NOTE — Telephone Encounter (Signed)
FYI  Triage received the following before return call transferred for any new provider orders.     "Longtime problem with constipation using Milk of Magnesia to help bowels move.  I eat a lot of slaw (cabbage) to increase roughage.      Trying laxitatives to clean after not being completely clean for a colonoscopy.    Stomach is bloated and gassy.  Passing gas has increased the past two weeks in inappropriate places at the most laxative but I was unable to be split pill in half to decrease the laxative effect.    Both caused bowels to move unknowingly and unexpectedly.  These medicines are to strong for me or I reacted to them."  "My experience being constipated means no movement longer than my normal three to four days between each.    Drinking at least three water bottles each day.  I don't know if they're eight or twelve ounce bottles.    Eating lots of slaw(cabbage) to add roughage.    Lifetime neuropathy is why I walk with a cane."     Notified Taesha P Dolin of personal actions to use for constipation which could work in sync with MD orders to ease constipation for colonoscopy and later use if daily bowel regimen needed after colonoscopy.   Increase activity.  Create a clear/safe path to walk slowly using walker, five to ten minutes three times daily using walker.   Drink 64 oz water daily in addition to mealtime beverages.  Drink more hot beverages.  Eating more whole wheat breads, cereals, vegetables, fruits and beans help bowels move.  Discuss diet, eating roughage and fiber with provider.  Increased "slaw"/cabbage may cause gassiness and bloating.  Avoid foods that cause gas.  Report constipation on third consecutive day bowels have not moved, any changes in bowel pattern or straining to empty.  Do not wait, simply call any day at anytime to provides opportunity to reach best outcomes.  Degree opf symptoms may allow milder treatment options.

## 2018-06-04 NOTE — Telephone Encounter (Signed)
Pt called requesting a call back from nurse about constipation problem.  Attempted to call pt back several times without success.  Unable to leave messages due to no voice mail available. Called daughter Eliezer Champagne and left message on voice mail requesting a call back from pt. Pt's   Phone      [phone].

## 2018-06-04 NOTE — Telephone Encounter (Signed)
Called pt on cell phone and left message on voice mail requesting a call back to nurse. Pt's    Cell    Phone      [phone].

## 2018-06-06 LAB — HM DEXA SCAN

## 2018-06-09 ENCOUNTER — Telehealth: Payer: Self-pay | Admitting: Internal Medicine

## 2018-06-09 ENCOUNTER — Encounter: Payer: Self-pay | Admitting: Internal Medicine

## 2018-06-09 ENCOUNTER — Ambulatory Visit: Payer: Medicare Other | Admitting: Internal Medicine

## 2018-06-09 ENCOUNTER — Telehealth: Payer: Self-pay

## 2018-06-09 VITALS — BP 122/64 | HR 92 | Ht 63.0 in | Wt 142.8 lb

## 2018-06-09 DIAGNOSIS — F5101 Primary insomnia: Secondary | ICD-10-CM | POA: Diagnosis not present

## 2018-06-09 DIAGNOSIS — J4541 Moderate persistent asthma with (acute) exacerbation: Secondary | ICD-10-CM | POA: Diagnosis not present

## 2018-06-09 MED ORDER — ALBUTEROL SULFATE HFA 108 (90 BASE) MCG/ACT IN AERS: INHALATION_SPRAY | RESPIRATORY_TRACT | 12 refills | Status: AC

## 2018-06-09 MED ORDER — TRAZODONE HCL 50 MG PO TABS: 50.0000 mg | ORAL_TABLET | Freq: Every day | ORAL | 5 refills | Status: AC

## 2018-06-09 MED ORDER — BUDESONIDE-FORMOTEROL FUMARATE 160-4.5 MCG/ACT IN AERO: INHALATION_SPRAY | RESPIRATORY_TRACT | 12 refills | Status: AC

## 2018-06-09 NOTE — Patient Instructions (Addendum)
Script printed refilling albuterol rescue inhaler  Script printed for trazodone     Try 1 at bedtime for sleep  Sample and script printed for Symbicort 160    Inhale 2 puffs, then rinse mouth, twice daily- maintenance.                  Try this instead of Breo  Order- CXR     Dx asthma moderate persistent, cough  Please call as needed

## 2018-06-09 NOTE — Telephone Encounter (Signed)
Noted  

## 2018-06-09 NOTE — Telephone Encounter (Signed)
Left voice message per Dr. Burr Medico fine for her to go back to taking Milk of Magnesia.

## 2018-06-09 NOTE — Telephone Encounter (Signed)
Patient states that still having issues with constipation. She wants to know if it is okay for her to go back to taking Milk of Magnesia.

## 2018-06-09 NOTE — Telephone Encounter (Signed)
Left voice message for patient letting her know that her bone density results are slightly worse than previous, please continue taking the Vitamin D.  Encouraged patient to call back if she has questions.

## 2018-06-09 NOTE — Telephone Encounter (Signed)
Yes, it's fine.  Truitt Merle MD
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
## 2018-06-10 ENCOUNTER — Telehealth: Payer: Self-pay

## 2018-06-10 ENCOUNTER — Ambulatory Visit (INDEPENDENT_AMBULATORY_CARE_PROVIDER_SITE_OTHER)
Admission: RE | Admit: 2018-06-10 | Discharge: 2018-06-10 | Disposition: A | Discharge status: Home / Self Care | Payer: Medicare Other | Source: Ambulatory Visit | Attending: Pulmonary Disease | Admitting: Pulmonary Disease

## 2018-06-10 ENCOUNTER — Telehealth: Payer: Self-pay | Admitting: Internal Medicine

## 2018-06-10 ENCOUNTER — Telehealth: Payer: Self-pay | Admitting: *Deleted

## 2018-06-10 DIAGNOSIS — J4541 Moderate persistent asthma with (acute) exacerbation: Secondary | ICD-10-CM

## 2018-06-10 NOTE — Telephone Encounter (Signed)
Spoke to patient concerning constipation, she will use MOM and if it doesn't work she will let us know.  I recommended she may want to follow up with her GI, she verbalized an understanding.

## 2018-06-10 NOTE — Telephone Encounter (Signed)
Please advise 

## 2018-06-10 NOTE — Telephone Encounter (Signed)
See previous call note for today.  No need for a new phone encounter for C/O constipation.

## 2018-06-10 NOTE — Telephone Encounter (Signed)
Copied from [place] [id number]. Topic: Inquiry >> Jun 10, 2018 11:39 AM Mylinda Latina, NT wrote: Reason for CRM: Patient called and states that she left a Drs appt and was suppose to get an x ray . She forgot to do that and called yesterday to make that appt for today .  Patient was on her way to the x ray place when she forgot where to go. She wants to talk to Dr. Larose Kells about her memory loss. She is concerned and those two incidents scared her. Please call patient CB# [phone].

## 2018-06-10 NOTE — Telephone Encounter (Signed)
Spoke w/ Pt- informed of recommendations- she has an appt scheduled w/ PCP on 06/19/2018- she has decided to wait to discuss with him then.

## 2018-06-10 NOTE — Telephone Encounter (Signed)
She could schedule an appointment here to do MMSE, a more detailed memory test on her or she could be referred to the clinical psychologist at neurology to get a formal/extensive evaluation.

## 2018-06-10 NOTE — Telephone Encounter (Signed)
Patient left another message regarding issues with constipation, when she used the Miralax it caused her be incontinent, she wants to use Milk of Magnesia with a lesser laxative.  I attempted to call the patient back, it rang but had no voice mail box to leave a message.

## 2018-06-10 NOTE — Telephone Encounter (Signed)
Received Bone Density results from [facility]; forwarded to provider/SLS 07/02

## 2018-06-13 ENCOUNTER — Encounter: Payer: Self-pay | Admitting: Internal Medicine

## 2018-06-17 ENCOUNTER — Telehealth: Payer: Self-pay | Admitting: *Deleted

## 2018-06-17 NOTE — Telephone Encounter (Signed)
Received Bone Density results from [facility]; forwarded to provider/SLS 07/09   

## 2018-06-19 ENCOUNTER — Encounter: Payer: Self-pay | Admitting: Internal Medicine

## 2018-06-19 ENCOUNTER — Ambulatory Visit: Payer: Medicare Other | Admitting: Internal Medicine

## 2018-06-19 VITALS — BP 122/78 | HR 73 | Temp 97.5°F | Resp 16 | Ht 63.0 in | Wt 145.1 lb

## 2018-06-19 DIAGNOSIS — E1142 Type 2 diabetes mellitus with diabetic polyneuropathy: Secondary | ICD-10-CM | POA: Diagnosis not present

## 2018-06-19 DIAGNOSIS — E785 Hyperlipidemia, unspecified: Secondary | ICD-10-CM | POA: Diagnosis not present

## 2018-06-19 DIAGNOSIS — I10 Essential (primary) hypertension: Secondary | ICD-10-CM | POA: Diagnosis not present

## 2018-06-19 DIAGNOSIS — R6889 Other general symptoms and signs: Secondary | ICD-10-CM

## 2018-06-19 DIAGNOSIS — R269 Unspecified abnormalities of gait and mobility: Secondary | ICD-10-CM | POA: Diagnosis not present

## 2018-06-19 LAB — LIPID PANEL
CHOL/HDL RATIO: 2
Cholesterol: 175 mg/dL (ref 0–200)
HDL: 71.1 mg/dL (ref >39.00)
LDL Cholesterol: 93 mg/dL (ref 0–99)
NONHDL: 103.93
Triglycerides: 55 mg/dL (ref 0.0–149.0)
VLDL: 11 mg/dL (ref 0.0–40.0)

## 2018-06-19 LAB — MICROALBUMIN / CREATININE URINE RATIO
Creatinine,U: 59.7 mg/dL
Microalb Creat Ratio: 1.7 mg/g (ref 0.0–30.0)
Microalb, Ur: 1 mg/dL (ref 0.0–1.9)

## 2018-06-19 LAB — HEMOGLOBIN A1C: HEMOGLOBIN A1C: 5.9 % (ref 4.6–6.5)

## 2018-06-19 NOTE — Patient Instructions (Signed)
GO TO THE LAB : Get the blood work     GO TO THE FRONT DESK Schedule your next appointment for a   checkup in 5 months   Diabetes and Foot Care Diabetes may cause you to have problems because of poor blood supply (circulation) to your feet and legs. This may cause the skin on your feet to become thinner, break easier, and heal more slowly. Your skin may become dry, and the skin may peel and crack. You may also have nerve damage in your legs and feet causing decreased feeling in them. You may not notice minor injuries to your feet that could lead to infections or more serious problems. Taking care of your feet is one of the most important things you can do for yourself. Follow these instructions at home:  Wear shoes at all times, even in the house. Do not go barefoot. Bare feet are easily injured.  Check your feet daily for blisters, cuts, and redness. If you cannot see the bottom of your feet, use a mirror or ask someone for help.  Wash your feet with warm water (do not use hot water) and mild soap. Then pat your feet and the areas between your toes until they are completely dry. Do not soak your feet as this can dry your skin.  Apply a moisturizing lotion or petroleum jelly (that does not contain alcohol and is unscented) to the skin on your feet and to dry, brittle toenails. Do not apply lotion between your toes.  Trim your toenails straight across. Do not dig under them or around the cuticle. File the edges of your nails with an emery board or nail file.  Do not cut corns or calluses or try to remove them with medicine.  Wear clean socks or stockings every day. Make sure they are not too tight. Do not wear knee-high stockings since they may decrease blood flow to your legs.  Wear shoes that fit properly and have enough cushioning. To break in new shoes, wear them for just a few hours a day. This prevents you from injuring your feet. Always look in your shoes before you put them on to be  sure there are no objects inside.  Do not cross your legs. This may decrease the blood flow to your feet.  If you find a minor scrape, cut, or break in the skin on your feet, keep it and the skin around it clean and dry. These areas may be cleansed with mild soap and water. Do not cleanse the area with peroxide, alcohol, or iodine.  When you remove an adhesive bandage, be sure not to damage the skin around it.  If you have a wound, look at it several times a day to make sure it is healing.  Do not use heating pads or hot water bottles. They may burn your skin. If you have lost feeling in your feet or legs, you may not know it is happening until it is too late.  Make sure your health care provider performs a complete foot exam at least annually or more often if you have foot problems. Report any cuts, sores, or bruises to your health care provider immediately. Contact a health care provider if:  You have an injury that is not healing.  You have cuts or breaks in the skin.  You have an ingrown nail.  You notice redness on your legs or feet.  You feel burning or tingling in your legs or feet.  You have pain or cramps in your legs and feet.  Your legs or feet are numb.  Your feet always feel cold. Get help right away if:  There is increasing redness, swelling, or pain in or around a wound.  There is a red line that goes up your leg.  Pus is coming from a wound.  You develop a fever or as directed by your health care provider.  You notice a bad smell coming from an ulcer or wound. This information is not intended to replace advice given to you by your health care provider. Make sure you discuss any questions you have with your health care provider. Document Released: 11/23/2000 Document Revised: 05/03/2016 Document Reviewed: 05/05/2013 Elsevier Interactive Patient Education  2017 [employer].

## 2018-06-19 NOTE — Progress Notes (Signed)
Pre visit review using our clinic review tool, if applicable. No additional management support is needed unless otherwise documented below in the visit note. 

## 2018-06-19 NOTE — Progress Notes (Signed)
Subjective:    Patient ID: [name], female    DOB: [birth year], 77 y.o.   MRN: [id number]  DOS:  06/19/2018 Type of visit - description : f/u Interval history: DM: Good compliance with medicines, no symptoms of low blood sugar HTN: Good compliance with medicines, no ambulatory BPs but BPs okay when she goes to see doctors Constipation: Still an issue, managed by GI Forgetful: Patient reports she is more forgetful.  She is somewhat concerned about it. Still somewhat concerned about weight loss, her weight has stabilized in the 140s though.  Wt Readings from Last 3 Encounters:  06/19/18 145 lb 2 oz (65.8 kg)  06/09/18 142 lb 12.8 oz (64.8 kg)  05/21/18 144 lb 2 oz (65.4 kg)    Review of Systems Denies chest pain or difficulty breathing. Occasionally has lower extremity edema No lower extremity paresthesias As far as being forgetful, she reports that she is able to pay her bills and do her paperwork by herself, still drives without problems and play cards with friends.  Past Medical History:  Diagnosis Date  . Allergic rhinitis   . Anal fissure   . Asthma    dx in adulthood (77 y/o) PFT 09/08/09 FEV1 1.70/81%; FEV1/FVC 0.75; +resp to dilartor; NI LV/DLCO  . Breast CA ([place])    hx of bilateral diagnosied in 2006, recurred 2011- [name]  . Chronic constipation   . Colonic polyp   . COPD (chronic obstructive pulmonary disease) ([place])   . Diabetes mellitus   . Dislocation of right elbow 2016  . HTN (hypertension) 11-2011  . Hyperlipidemia   . OSA (obstructive sleep apnea)    CPAP intolerant  . Osteopenia   . Peripheral neuropathy   . 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 Urinary retention    During massive constipation (pelvic exam, suspect 8cm or more in diameter)    Past Surgical History:  Procedure Laterality Date  . ANAL RECTAL MANOMETRY N/A 04/23/2016   Procedure: ANO RECTAL MANOMETRY;  Surgeon: [name], MD;  Location: [facility]  ENDOSCOPY;  Service: Endoscopy;  Laterality: N/A;  . APPENDECTOMY     08/2004  . cyct on spine      1967  . ELBOW SURGERY Right 2016   DISLOCATION  . EXPLORATORY LAPAROTOMY    . FLEXIBLE SIGMOIDOSCOPY N/A 01/31/2013   Procedure: FLEXIBLE SIGMOIDOSCOPY;  Surgeon: [name], MD;  Location: [facility] ENDOSCOPY;  Service: Endoscopy;  Laterality: N/A;  unprepped with sedation  . IR ANGIOGRAM PELVIS SELECTIVE OR SUPRASELECTIVE  05/22/2017  . IR ANGIOGRAM SELECTIVE EACH ADDITIONAL VESSEL  05/22/2017  . IR RADIOLOGIST EVAL & MGMT  04/23/2017  . IR RADIOLOGIST EVAL & MGMT  06/26/2017  . IR US GUIDE BX ASP/DRAIN  06/14/2017  . IR US GUIDE VASC ACCESS LEFT  05/22/2017  . lypoma removed from back-benign 2005     2005  . MASTECTOMY Bilateral 2006  . Pilonidal Cyst Surgery     ? of  . removal of boils     1970/1980    Social History   Socioeconomic History  . Marital status: Widowed    Spouse name: Not on file  . Number of children: 2  . Years of education: Not on file  . Highest education level: Not on file  Occupational History  . Occupation: retired    Fish farm manager: RETIRED  Social Needs  . Financial resource strain: Not on file  . Food insecurity:    Worry: Not on file  Inability: Not on file  . Transportation needs:    Medical: Not on file    Non-medical: Not on file  Tobacco Use  . Smoking status: Former Smoker    Packs/day: 1.00    Years: 4.00    Pack years: 4.00    Types: Cigarettes    Last attempt to quit: 04/01/1968    Years since quitting: 50.2  . Smokeless tobacco: Never Used  . Tobacco comment: Quit at age 63  Substance and Sexual Activity  . Alcohol use: No  . Drug use: No  . Sexual activity: Not Currently  Lifestyle  . Physical activity:    Days per week: Not on file    Minutes per session: Not on file  . Stress: Not on file  Relationships  . Social connections:    Talks on phone: Not on file    Gets together: Not on file    Attends religious service: Not on  file    Active member of club or organization: Not on file    Attends meetings of clubs or organizations: Not on file    Relationship status: Not on file  . Intimate partner violence:    Fear of current or ex partner: Not on file    Emotionally abused: Not on file    Physically abused: Not on file    Forced sexual activity: Not on file  Other Topics Concern  . 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 husband 4-10, her disable child lives w/ her, he does most of physical work at home    Does drive, plays cards, manage her finances, cook   retired Pharmacist, hospital   2 children    [place] twins        Allergies as of 06/19/2018      Reactions   Peanut-containing Drug Products Swelling   Penicillins Itching   Has patient had a PCN reaction causing immediate rash, facial/tongue/throat swelling, SOB or lightheadedness with hypotension: No Has patient had a PCN reaction causing severe rash involving mucus membranes or skin necrosis: No Has patient had a PCN reaction that required hospitalization: No Has patient had a PCN reaction occurring within the last 10 years: No If all of the above answers are "NO", then may proceed with Cephalosporin use. "outer rectal itching"      Medication List        Accurate as of 06/19/18 11:59 PM. Always use your most recent med list.          albuterol 108 (90 Base) MCG/ACT inhaler Commonly known as:  VENTOLIN HFA INHALE 2 PUFFS INTO THE LUNGS EVERY 4 HOURS AS NEEDED FOR WHEEZING OR SHORTNESS OF BREATH   aspirin EC 81 MG tablet Take 81 mg by mouth daily.   budesonide-formoterol 160-4.5 MCG/ACT inhaler Commonly known as:  SYMBICORT Inhale 2 puffs then rinse mouth, twice daily   EPINEPHrine 0.3 mg/0.3 mL Soaj injection Commonly known as:  EPIPEN 2-PAK Inject 0.3 mLs (0.3 mg total) into the muscle once.   fexofenadine 180 MG tablet Commonly known as:  ALLEGRA Take 180 mg by mouth daily.   glucose blood test strip Commonly known as:  ONE  TOUCH ULTRA TEST Check blood sugars daily.   letrozole 2.5 MG tablet Commonly known as:  FEMARA Take 1 tablet (2.5 mg total) by mouth daily.   magnesium hydroxide 400 MG/5ML suspension Commonly known as:  MILK OF MAGNESIA Take 15 mLs by mouth daily as needed for mild constipation.  metFORMIN 1000 MG tablet Commonly known as:  GLUCOPHAGE Take 1 tablet (1,000 mg total) by mouth daily.   multivitamin with minerals Tabs tablet Take 1 tablet by mouth daily.   ONE TOUCH LANCETS Misc Use as directed once daily to check blood sugar.  DX E11.9   polyethylene glycol packet Commonly known as:  MIRALAX Take 17 g by mouth daily.   simvastatin 40 MG tablet Commonly known as:  ZOCOR Take 1 tablet (40 mg total) by mouth at bedtime.   traZODone 50 MG tablet Commonly known as:  DESYREL Take 1 tablet (50 mg total) by mouth at bedtime.   triamterene-hydrochlorothiazide 37.5-25 MG tablet Commonly known as:  MAXZIDE-25 Take 1 tablet by mouth daily.          Objective:   Physical Exam BP 122/78 (BP Location: Left Wrist, Patient Position: Sitting, Cuff Size: Small)   Pulse 73   Temp (!) 97.5 F (36.4 C) (Oral)   Resp 16   Ht 5\' 3"  (1.6 m)   Wt 145 lb 2 oz (65.8 kg)   SpO2 97%   BMI 25.71 kg/m  General:   Well developed, NAD, see BMI.  HEENT:  Normocephalic . Face symmetric, atraumatic Lungs:  CTA B Normal respiratory effort, no intercostal retractions, no accessory muscle use. Heart: RRR,  no murmur.  No pretibial edema bilaterally  DIABETIC FEET EXAM: No lower extremity edema Normal pedal pulses bilaterally Skin normal, nails normal, no calluses Pinprick examination: + Numbness bilaterally  skin: Not pale. Not jaundice Neurologic:  alert & oriented X3.  Speech normal, gait assisted by a cane, seems a steady Psych--  Cognition and judgment appear intact.  Cooperative with normal attention span and concentration.  Behavior appropriate. No anxious or depressed  appearing.      Assessment & Plan:  Assessment   DM  Neuropathy (neg sx, + numbness) HTN Hyperlipidemia Mild hypercalcemia, normal PTH 09-2014 GI: Chronic, severe constipation:  s/p several colonoscopies, very limited d/p poor prep unable to finish exam . Virtual CT 2011 unrevealing . Usually good response to MOM, admitted 2014 d/t impaction, had a flex sig. Virtual cscope 11-2015 neg GB stones  Pulmonary Dr Annamaria Boots --COPD, asthma --OSA CPAP intolerant --Nodules noted on CT-2017, stable. Osteoporosis  (osteopenia plus vertebral Fx per CT), [facility] 2011 normal DEXA; T score 05/2012 -0.9  T score 05-2014 T score -1.37 Oncology  Breast cancer bilateral 2006 --s/ p B mastectomy   --Recurrence L mastectomy site  2011, s/p excision >> XRT, Letrozole For the next 10 years, end 2021 Diplopia, gait disorder: Saw neurology 2015/ 2016. TSH, myasthenia panel were negative. MRI brain no acute Gait d/o: chronic imbalance since ~ 2016 Acute urinary retention 08/22/2015  PLAN: DM: Feet exam consistent with neuropathy, feet care discussed.  Continue metformin, ambulatory CBGs in the 120s.  Check a A1c and micro.  Reportedly she sees ophthalmology regularly. HTN: BP is normal, on Maxide, last BMP satisfactory High cholesterol: On simvastatin, check a FLP, last LFTs normal Imbalance, gait disorder: Stable. Forgetful: Patient is somewhat forgetful, she is concerned about dementia, she is alert and oriented x3 today, she is highly functional.  Although no MMSE was performed today I think she is doing very good.  Recommend observation Osteopenia: Order DEXA at the next visit Constipation: Per GI RTC 5 months   Today, I spent more than  25  min with the patient: >50% of the time counseling regards feet care, also explaining patient why am not  concerned about she been somewhat forgetful, she is highly functional and and simply needs to be observed.

## 2018-06-20 DIAGNOSIS — R269 Unspecified abnormalities of gait and mobility: Secondary | ICD-10-CM | POA: Insufficient documentation

## 2018-06-20 NOTE — Assessment & Plan Note (Signed)
DM: Feet exam consistent with neuropathy, feet care discussed.  Continue metformin, ambulatory CBGs in the 120s.  Check a A1c and micro.  Reportedly she sees ophthalmology regularly. HTN: BP is normal, on Maxide, last BMP satisfactory High cholesterol: On simvastatin, check a FLP, last LFTs normal Imbalance, gait disorder: Stable. Forgetful: Patient is somewhat forgetful, she is concerned about dementia, she is alert and oriented x3 today, she is highly functional.  Although no MMSE was performed today I think she is doing very good.  Recommend observation Osteopenia: Order DEXA at the next visit Constipation: Per GI RTC 5 months

## 2018-06-23 ENCOUNTER — Inpatient Hospital Stay: Payer: Medicare Other | Attending: Hematology | Admitting: Nurse Practitioner

## 2018-06-23 ENCOUNTER — Encounter: Payer: Self-pay | Admitting: Nurse Practitioner

## 2018-06-23 ENCOUNTER — Inpatient Hospital Stay: Payer: Medicare Other

## 2018-06-23 VITALS — BP 139/75 | HR 73 | Temp 98.1°F | Resp 17 | Ht 63.0 in | Wt 148.1 lb

## 2018-06-23 DIAGNOSIS — E785 Hyperlipidemia, unspecified: Secondary | ICD-10-CM | POA: Diagnosis not present

## 2018-06-23 DIAGNOSIS — Z79811 Long term (current) use of aromatase inhibitors: Secondary | ICD-10-CM | POA: Insufficient documentation

## 2018-06-23 DIAGNOSIS — Z79899 Other long term (current) drug therapy: Secondary | ICD-10-CM | POA: Insufficient documentation

## 2018-06-23 DIAGNOSIS — E1140 Type 2 diabetes mellitus with diabetic neuropathy, unspecified: Secondary | ICD-10-CM | POA: Diagnosis not present

## 2018-06-23 DIAGNOSIS — Z9013 Acquired absence of bilateral breasts and nipples: Secondary | ICD-10-CM | POA: Insufficient documentation

## 2018-06-23 DIAGNOSIS — I10 Essential (primary) hypertension: Secondary | ICD-10-CM | POA: Insufficient documentation

## 2018-06-23 DIAGNOSIS — Z7984 Long term (current) use of oral hypoglycemic drugs: Secondary | ICD-10-CM | POA: Insufficient documentation

## 2018-06-23 DIAGNOSIS — Z170 Estrogen receptor positive status [ER+]: Secondary | ICD-10-CM | POA: Diagnosis not present

## 2018-06-23 DIAGNOSIS — C50912 Malignant neoplasm of unspecified site of left female breast: Secondary | ICD-10-CM

## 2018-06-23 DIAGNOSIS — K529 Noninfective gastroenteritis and colitis, unspecified: Secondary | ICD-10-CM | POA: Diagnosis not present

## 2018-06-23 DIAGNOSIS — Z7982 Long term (current) use of aspirin: Secondary | ICD-10-CM | POA: Diagnosis not present

## 2018-06-23 DIAGNOSIS — G4733 Obstructive sleep apnea (adult) (pediatric): Secondary | ICD-10-CM | POA: Insufficient documentation

## 2018-06-23 DIAGNOSIS — M8580 Other specified disorders of bone density and structure, unspecified site: Secondary | ICD-10-CM | POA: Insufficient documentation

## 2018-06-23 DIAGNOSIS — J449 Chronic obstructive pulmonary disease, unspecified: Secondary | ICD-10-CM | POA: Insufficient documentation

## 2018-06-23 DIAGNOSIS — K5900 Constipation, unspecified: Secondary | ICD-10-CM

## 2018-06-23 LAB — COMPREHENSIVE METABOLIC PANEL
ALT: 14 U/L (ref 0–44)
ANION GAP: 10 (ref 5–15)
AST: 16 U/L (ref 15–41)
Albumin: 4.4 g/dL (ref 3.5–5.0)
Alkaline Phosphatase: 67 U/L (ref 38–126)
BUN: 13 mg/dL (ref 8–23)
CHLORIDE: 98 mmol/L (ref 98–111)
CO2: 29 mmol/L (ref 22–32)
Calcium: 10.5 mg/dL — ABNORMAL HIGH (ref 8.9–10.3)
Creatinine, Ser: 0.84 mg/dL (ref 0.44–1.00)
Glucose, Bld: 89 mg/dL (ref 70–99)
Potassium: 3.8 mmol/L (ref 3.5–5.1)
Sodium: 137 mmol/L (ref 135–145)
TOTAL PROTEIN: 7.6 g/dL (ref 6.5–8.1)
Total Bilirubin: 0.8 mg/dL (ref 0.3–1.2)

## 2018-06-23 LAB — CBC WITH DIFFERENTIAL/PLATELET
BASOS ABS: 0.1 10*3/uL (ref 0.0–0.1)
Basophils Relative: 1 %
EOS PCT: 5 %
Eosinophils Absolute: 0.4 10*3/uL (ref 0.0–0.5)
HCT: 43.4 % (ref 34.8–46.6)
Hemoglobin: 14.3 g/dL (ref 11.6–15.9)
LYMPHS PCT: 18 %
Lymphs Abs: 1.3 10*3/uL (ref 0.9–3.3)
MCH: 30.1 pg (ref 25.1–34.0)
MCHC: 32.9 g/dL (ref 31.5–36.0)
MCV: 91.4 fL (ref 79.5–101.0)
MONO ABS: 0.5 10*3/uL (ref 0.1–0.9)
MONOS PCT: 7 %
Neutro Abs: 5.3 10*3/uL (ref 1.5–6.5)
Neutrophils Relative %: 69 %
PLATELETS: 272 10*3/uL (ref 145–400)
RBC: 4.75 MIL/uL (ref 3.70–5.45)
RDW: 13.9 % (ref 11.2–14.5)
WBC: 7.6 10*3/uL (ref 3.9–10.3)

## 2018-06-23 NOTE — Progress Notes (Signed)
[place]  Telephone:([phone]) [phone] [phone]) [phone]  Clinic Follow up Note   Patient Care Team: Joanne Branch, MD as PCP - General Joanne Lever, MD as Consulting Physician (Pulmonary Disease) Joanne Bison Joanne Massed, NP as Nurse Practitioner (Hematology and Oncology) Joanne Seltzer, MD as Consulting Physician (General Surgery) Joanne Partridge, DO as Consulting Physician (Neurology) Joanne Frock, MD as Consulting Physician (Urology) Joanne Silence, MD as Consulting Physician (Gastroenterology) Joanne Pearson, MD as Consulting Physician (Ophthalmology) Joanne Dutch, MD as Consulting Physician (Vascular Surgery) 06/23/2018  Principle Diagnosis:[name] 77 y.o. 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     Prior Therapy: 1. Patient underwent a screening mammogram and abnormalities were detected in her breasts. Biopsy was positive for invasive cancer. She underwent a bilateral mastectomy on 01/30/05 by Joanne. Excell Snyder. 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   2. Patient later had recurrence along the left mastectomy site of invasive ductal carcinoma, lymph nodes were negative and it was excised on 04/18/10 with negative margins, she underwent radiation therapy, followed by adjuvant Letrozole   Current therapy:  Letrozole 2.63m daily since 06/2010  INTERVAL HISTORY: Ms. [name] for follow up. She continues letrozole. Denies bone or joint pain. She reports chronic diarrhea that is worse lately. No BM in approximately 1 week, however she has gone longer than that. She usually has successful BM with milk of magnesia in varying doses, although has not helped her yet this week. She was on amitiza per GI Joanne. NSilverio Decampwhich caused stool  incontinence. She called GI who lowered the dose. It's not clear if she tried lower dose but she is currently not on this medication. Denies n/v/diarrhea, abdominal pain, or blood in stool. She has perianal tumor that she notes is unchanged and nonpainful. Energy is low, but at baseline. Appetite is fair. Chronic cough is at baseline, she saw asthma doctor recently. Denies chest pain or dyspnea. Has occasional transient pain at right mastectomy site.   REVIEW OF SYSTEMS:   Constitutional: Denies fevers, chills or abnormal weight loss Eyes: Denies blurriness of vision Ears, nose, mouth, throat, and face: Denies mucositis or sore throat Respiratory: Denies cough, dyspnea or wheezes Cardiovascular: Denies palpitation, chest discomfort or lower extremity swelling Gastrointestinal:  Denies nausea, vomiting, diarrhea, hematochezia, heartburn or change in bowel habits (+) worsening constipation (+) perianal tumor, stable  Skin: Denies abnormal skin rashes Lymphatics: Denies new lymphadenopathy or easy bruising Neurological:Denies new weaknesses (+) neuropathy  Behavioral/Psych: Mood is stable, no new changes  All other systems were reviewed with the patient and are negative.  MEDICAL HISTORY:  Past Medical History:  Diagnosis Date  . Allergic rhinitis   . Anal fissure   . Asthma    dx in adulthood ((77y/o) PFT 09/08/09 FEV1 1.70/81%; FEV1/FVC 0.75; +resp to dilartor; NI LV/DLCO  . [place]    hx of bilateral diagnosied in 2006, recurred 2011- [name] . Chronic constipation   . Colonic polyp   . [place]   . Diabetes mellitus   . Dislocation of right elbow 2016  . HTN (hypertension) 11-2011  . Hyperlipidemia   . OSA (obstructive sleep apnea)    CPAP intolerant  . Osteopenia   . Peripheral neuropathy   . Pseudoaneurysm of  Pudendal Artery 04/26/2017  . Urinary retention    During massive constipation (pelvic exam, suspect 8cm  or more in diameter)    SURGICAL HISTORY: Past Surgical History:  Procedure Laterality Date  . ANAL RECTAL MANOMETRY N/A 04/23/2016   Procedure: ANO RECTAL MANOMETRY;  Surgeon: Joanne Silence, MD;  Location: [facility] ENDOSCOPY;  Service: Endoscopy;  Laterality: N/A;  . APPENDECTOMY     08/2004  . cyct on spine      1967  . ELBOW SURGERY Right 2016   DISLOCATION  . EXPLORATORY LAPAROTOMY    . FLEXIBLE SIGMOIDOSCOPY N/A 01/31/2013   Procedure: FLEXIBLE SIGMOIDOSCOPY;  Surgeon: Joanne Ng, MD;  Location[facility] ENDOSCOPY;  Service: Endoscopy;  Laterality: N/A;  unprepped with sedation  . IR ANGIOGRAM PELVIS SELECTIVE OR SUPRASELECTIVE  05/22/2017  . IR ANGIOGRAM SELECTIVE EACH ADDITIONAL VESSEL  05/22/2017  . IR RADIOLOGIST EVAL & MGMT  04/23/2017  . IR RADIOLOGIST EVAL & MGMT  06/26/2017  . IR US GUIDE BX ASP/DRAIN  06/14/2017  . IR US GUIDE VASC ACCESS LEFT  05/22/2017  . lypoma removed from back-benign 2005     2005  . MASTECTOMY Bilateral 2006  . Pilonidal Cyst Surgery     ? of  . removal of boils     1970/1980    I have reviewed the social history and family history with the patient and they are unchanged from previous note.  ALLERGIES:  is allergic to peanut-containing drug products and penicillins.  MEDICATIONS:  Current Outpatient Medications  Medication Sig Dispense Refill  . albuterol (VENTOLIN HFA) 108 (90 Base) MCG/ACT inhaler INHALE 2 PUFFS INTO THE LUNGS EVERY 4 HOURS AS NEEDED FOR WHEEZING OR SHORTNESS OF BREATH 18 g 12  . aspirin EC 81 MG tablet Take 81 mg by mouth daily.    . budesonide-formoterol (SYMBICORT) 160-4.5 MCG/ACT inhaler Inhale 2 puffs then rinse mouth, twice daily 1 Inhaler 12  . EPINEPHrine (EPIPEN 2-PAK) 0.3 mg/0.3 mL IJ SOAJ injection Inject 0.3 mLs (0.3 mg total) into the muscle once. 2 Device 1  . fexofenadine (ALLEGRA) 180 MG tablet Take 180 mg by mouth daily.     Marland Kitchen glucose blood (ONE TOUCH ULTRA TEST) test strip Check blood sugars daily. 100 each 12    . letrozole (FEMARA) 2.5 MG tablet Take 1 tablet (2.5 mg total) by mouth daily. 90 tablet 3  . magnesium hydroxide (MILK OF MAGNESIA) 400 MG/5ML suspension Take 15 mLs by mouth daily as needed for mild constipation.    . metFORMIN (GLUCOPHAGE) 1000 MG tablet Take 1 tablet (1,000 mg total) by mouth daily. 90 tablet 1  . Multiple Vitamin (MULTIVITAMIN WITH MINERALS) TABS tablet Take 1 tablet by mouth daily.    . ONE TOUCH LANCETS MISC Use as directed once daily to check blood sugar.  DX E11.9 200 each 2  . simvastatin (ZOCOR) 40 MG tablet Take 1 tablet (40 mg total) by mouth at bedtime. 90 tablet 2  . traZODone (DESYREL) 50 MG tablet Take 1 tablet (50 mg total) by mouth at bedtime. 30 tablet 5  . triamterene-hydrochlorothiazide (MAXZIDE-25) 37.5-25 MG tablet Take 1 tablet by mouth daily. 90 tablet 1  . lubiprostone (AMITIZA) 24 MCG capsule Take 24 mcg by mouth 2 (two) times daily with a meal.    . polyethylene glycol (MIRALAX) packet Take 17 g by mouth daily. (Patient not taking: Reported on 06/23/2018) 14 each 0   No current facility-administered medications for this visit.     PHYSICAL EXAMINATION:  ECOG PERFORMANCE STATUS: 1 - Symptomatic but completely ambulatory  Vitals:   06/23/18 0850  BP: 139/75  Pulse: 73  Resp: 17  Temp: 98.1 F (36.7 C)  SpO2: 100%   Filed Weights   06/23/18 0850  Weight: 148 lb 1.6 oz (67.2 kg)    GENERAL:alert, no distress and comfortable SKIN: no rashes or significant lesions EYES: normal, Conjunctiva are pink and non-injected, sclera clear OROPHARYNX:no thrush or ulcers  LYMPH:  no palpable cervical or supraclavicular lymphadenopathy  LUNGS: clear to auscultation with normal breathing effort HEART: regular rate & rhythm and no murmurs and no lower extremity edema ABDOMEN:abdomen soft, non-tender and normal bowel sounds Rectal: 2 cm subcutaneous nontender nodule to right perianal area below labia  Musculoskeletal:no cyanosis of digits and no  clubbing. Mild limited ROM in right upper arm  NEURO: alert & oriented x 3 with fluent speech. Ambulates independently with cane  Breast: s/p bilateral mastectomy, no nodularity along incisions or to chest wall.   LABORATORY DATA:  I have reviewed the data as listed CBC Latest Ref Rng & Units 06/23/2018 04/10/2018 12/24/2017  WBC 3.9 - 10.3 K/uL 7.6 9.1 7.4  Hemoglobin 11.6 - 15.9 g/dL 14.3 14.1 14.8  Hematocrit 34.8 - 46.6 % 43.4 41.9 43.8  Platelets 145 - 400 K/uL 272 299 270     CMP Latest Ref Rng & Units 06/23/2018 04/10/2018 12/24/2017  Glucose 70 - 99 mg/dL 89 85 87  BUN 8 - 23 mg/dL '13 23 16  ' Creatinine 0.44 - 1.00 mg/dL 0.84 1.06 0.82  Sodium 135 - 145 mmol/L 137 135(L) 139  Potassium 3.5 - 5.1 mmol/L 3.8 3.8 3.5  Chloride 98 - 111 mmol/L 98 97(L) 101  CO2 22 - 32 mmol/L 29 31(H) 28  Calcium 8.9 - 10.3 mg/dL 10.5(H) 10.8(H) 10.2  Total Protein 6.5 - 8.1 g/dL 7.6 7.5 7.4  Total Bilirubin 0.3 - 1.2 mg/dL 0.8 0.6 0.9  Alkaline Phos 38 - 126 U/L 67 52 57  AST 15 - 41 U/L '16 22 22  ' ALT 0 - 44 U/L '14 19 24      ' RADIOGRAPHIC STUDIES: I have personally reviewed the radiological images as listed and agreed with the findings in the report. No results found.   ASSESSMENT & PLAN: [name] 77 y.o. female with  1.  ER/PR positive invasive mammary carcinoma in left breast in 2006 with local recurrence in 2011.   2. Right perianal fibrous tumor  3. Hypercalcemia  4. Osteopenia 5. HTN, DM, COPD 6. Balance issue  7. Constipation   Ms. Delon appears stable. Physical exam is unremarkable. VS and weight are normal. Labs reviewed, CBC and CMP are unremarkable except mild hypercalcemia Ca 10.5. Np clinical concern for recurrence. DEXA showed T score at left femur neck -2.1, she has been informed. I recommend she take 1000 IU vitamin D daily. She remains off calcium supplement. I recommend to continue surveillance. Plan to see her back next spring 2020; she will complete AI  in 2021 after 10 years on therapy.   She has chronic constipation, worse lately; currently managed by GI Joanne. Silverio Snyder. She continues milk of magnesia, I recommend adding stool softener 1-2 times daily, and use mag citrate is no BM in 1 or 2 days; she has had success with this in the past. I recommend maintaining adequate hydration. She has f/u with GI on 7/23, I urged her to keep this appt. She agrees.   PLAN -Labs reviewed -Continue breast cancer  surveillance, continue letrozole daily -Stool softener 1-2 x daily, continue milk of mag -Mag citrate if no BM in 1-2 days -F/u with GI 7/23 -Begin vitamin D supplement daily  -F/u [facility] 04/2019  All questions were answered. The patient knows to call the clinic with any problems, questions or concerns. No barriers to learning was detected. I spent 20 minutes counseling the patient face to face. The total time spent in the appointment was 25 minutes and more than 50% was on counseling and review of test results     Alla Feeling, NP 06/23/18

## 2018-06-24 ENCOUNTER — Telehealth: Payer: Self-pay

## 2018-06-24 NOTE — Telephone Encounter (Signed)
Per 7/15 no los °

## 2018-07-01 ENCOUNTER — Ambulatory Visit: Payer: Medicare Other | Admitting: Gastroenterology

## 2018-07-01 ENCOUNTER — Encounter: Payer: Self-pay | Admitting: Gastroenterology

## 2018-07-01 VITALS — BP 108/64 | HR 76 | Ht 63.5 in | Wt 143.4 lb

## 2018-07-01 DIAGNOSIS — K5902 Outlet dysfunction constipation: Secondary | ICD-10-CM

## 2018-07-01 DIAGNOSIS — Z1212 Encounter for screening for malignant neoplasm of rectum: Secondary | ICD-10-CM | POA: Diagnosis not present

## 2018-07-01 DIAGNOSIS — Z1211 Encounter for screening for malignant neoplasm of colon: Secondary | ICD-10-CM

## 2018-07-01 DIAGNOSIS — N816 Rectocele: Secondary | ICD-10-CM

## 2018-07-01 MED ORDER — AMBULATORY NON FORMULARY MEDICATION: 1 refills | Status: AC

## 2018-07-01 MED ORDER — PLENVU 140 G PO SOLR: 1.0000 | Freq: Once | ORAL | 0 refills | Status: AC

## 2018-07-01 NOTE — Progress Notes (Signed)
[name]    [id number]    11/10/1941  Primary Care Physician:Paz, Alda Berthold, MD  Referring Physician: Colon Branch, MD [address] [place], [place] [address]  Chief complaint:  Constipation  HPI: 77 yr old female with chronic constipation, last seen in June 2019 here for follow-up visit.  She continues to have alternating constipation and diarrhea.  She tried Amitiza both 24 mcg and 8 mcg, was taking 1 capsule with each meal 3 times daily, reports having diarrhea with multiple bowel movements whenever she took a capsule.  She has difficulty evacuation and she also complains of feeling a bulge in her vulval area in between her vagina and anal opening which protrudes out and she feels a firm mass.  Occasionally she has to apply pressure in the area to evacuate her have a bowel movement.   Relevant GI history: Patient was previously followed by [facility], flexible sigmoidoscopy February 2014 with poor prep.  Prior to that she did have attempted colonoscopies with poor prep as well on 2 occasions.   Outpatient Encounter Medications as of 07/01/2018  Medication Sig  . albuterol (VENTOLIN HFA) 108 (90 Base) MCG/ACT inhaler INHALE 2 PUFFS INTO THE LUNGS EVERY 4 HOURS AS NEEDED FOR WHEEZING OR SHORTNESS OF BREATH  . aspirin EC 81 MG tablet Take 81 mg by mouth daily.  . budesonide-formoterol (SYMBICORT) 160-4.5 MCG/ACT inhaler Inhale 2 puffs then rinse mouth, twice daily  . EPINEPHrine (EPIPEN 2-PAK) 0.3 mg/0.3 mL IJ SOAJ injection Inject 0.3 mLs (0.3 mg total) into the muscle once.  . fexofenadine (ALLEGRA) 180 MG tablet Take 180 mg by mouth daily.   Marland Kitchen glucose blood (ONE TOUCH ULTRA TEST) test strip Check blood sugars daily.  Marland Kitchen letrozole (FEMARA) 2.5 MG tablet Take 1 tablet (2.5 mg total) by mouth daily.  . magnesium hydroxide (MILK OF MAGNESIA) 400 MG/5ML suspension Take 15 mLs by mouth daily as needed for mild constipation.  . metFORMIN (GLUCOPHAGE) 1000  MG tablet Take 1 tablet (1,000 mg total) by mouth daily.  . Multiple Vitamin (MULTIVITAMIN WITH MINERALS) TABS tablet Take 1 tablet by mouth daily.  . ONE TOUCH LANCETS MISC Use as directed once daily to check blood sugar.  DX E11.9  . polyethylene glycol (MIRALAX) packet Take 17 g by mouth daily. (Patient not taking: Reported on 06/23/2018)  . simvastatin (ZOCOR) 40 MG tablet Take 1 tablet (40 mg total) by mouth at bedtime.  . traZODone (DESYREL) 50 MG tablet Take 1 tablet (50 mg total) by mouth at bedtime.  . triamterene-hydrochlorothiazide (MAXZIDE-25) 37.5-25 MG tablet Take 1 tablet by mouth daily.  . [DISCONTINUED] lubiprostone (AMITIZA) 24 MCG capsule Take 24 mcg by mouth 2 (two) times daily with a meal.   No facility-administered encounter medications on file as of 07/01/2018.     Allergies as of 07/01/2018 - Review Complete 07/01/2018  Allergen Reaction Noted  . Peanut-containing drug products Swelling 06/09/2012  . Penicillins Itching 08/22/2005    Past Medical History:  Diagnosis Date  . Allergic rhinitis   . Anal fissure   . Asthma    dx in adulthood (77 y/o) PFT 09/08/09 FEV1 1.70/81%; FEV1/FVC 0.75; +resp to dilartor; NI LV/DLCO  . Breast CA ([place])    hx of bilateral diagnosied in 2006, recurred 2011- [name]  . Chronic constipation   . Colonic polyp   . COPD (chronic obstructive pulmonary disease) ([place])   .  Diabetes mellitus   . Dislocation of right elbow 2016  . HTN (hypertension) 11-2011  . Hyperlipidemia   . OSA (obstructive sleep apnea)    CPAP intolerant  . Osteopenia   . Peripheral neuropathy   . 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 Urinary retention    During massive constipation (pelvic exam, suspect 8cm or more in diameter)    Past Surgical History:  Procedure Laterality Date  . ANAL RECTAL MANOMETRY N/A 04/23/2016   Procedure: ANO RECTAL MANOMETRY;  Surgeon: [name], MD;  Location: [facility] ENDOSCOPY;  Service: Endoscopy;   Laterality: N/A;  . APPENDECTOMY     08/2004  . cyct on spine      1967  . ELBOW SURGERY Right 2016   DISLOCATION  . EXPLORATORY LAPAROTOMY    . FLEXIBLE SIGMOIDOSCOPY N/A 01/31/2013   Procedure: FLEXIBLE SIGMOIDOSCOPY;  Surgeon: [name], MD;  Location[facility] ENDOSCOPY;  Service: Endoscopy;  Laterality: N/A;  unprepped with sedation  . IR ANGIOGRAM PELVIS SELECTIVE OR SUPRASELECTIVE  05/22/2017  . IR ANGIOGRAM SELECTIVE EACH ADDITIONAL VESSEL  05/22/2017  . IR RADIOLOGIST EVAL & MGMT  04/23/2017  . IR RADIOLOGIST EVAL & MGMT  06/26/2017  . IR US GUIDE BX ASP/DRAIN  06/14/2017  . IR US GUIDE VASC ACCESS LEFT  05/22/2017  . lypoma removed from back-benign 2005     2005  . MASTECTOMY Bilateral 2006  . Pilonidal Cyst Surgery     ? of  . removal of boils     1970/1980    Family History  Problem Relation Age of Onset  . COPD Mother        smoker  . Hypertension Mother   . Lung cancer Father        died age 48  . Breast cancer Sister   . Diabetes Brother   . Breast cancer Maternal Grandmother   . Schizophrenia Son   . Autism Grandchild        x 2 grandsons  . Colon cancer Neg Hx   . Heart attack Neg Hx        no h/o early diseae    Social History   Socioeconomic History  . Marital status: Widowed    Spouse name: Not on file  . Number of children: 2  . Years of education: Not on file  . Highest education level: Not on file  Occupational History  . Occupation: retired    Fish farm manager: RETIRED  Social Needs  . Financial resource strain: Not on file  . Food insecurity:    Worry: Not on file    Inability: Not on file  . Transportation needs:    Medical: Not on file    Non-medical: Not on file  Tobacco Use  . Smoking status: Former Smoker    Packs/day: 1.00    Years: 4.00    Pack years: 4.00    Types: Cigarettes    Last attempt to quit: 04/01/1968    Years since quitting: 50.2  . Smokeless tobacco: Never Used  . Tobacco comment: Quit at age 74  Substance and  Sexual Activity  . Alcohol use: No  . Drug use: No  . Sexual activity: Not Currently  Lifestyle  . Physical activity:    Days per week: Not on file    Minutes per session: Not on file  . Stress: Not on file  Relationships  . Social connections:    Talks on phone: Not on file    Gets  together: Not on file    Attends religious service: Not on file    Active member of club or organization: Not on file    Attends meetings of clubs or organizations: Not on file    Relationship status: Not on file  . Intimate partner violence:    Fear of current or ex partner: Not on file    Emotionally abused: Not on file    Physically abused: Not on file    Forced sexual activity: Not on file  Other Topics Concern  . 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 husband 4-10, her disable child lives w/ her, he does most of physical work at home    Does drive, plays cards, manage her finances, cook   retired Pharmacist, hospital   2 children    [place] twins        Review of systems: Review of Systems  Constitutional: Negative for fever and chills. Positive for lack of energy HENT: Negative.   Eyes: Negative for blurred vision.  Respiratory: Negative for cough, shortness of breath and wheezing.   Cardiovascular: Negative for chest pain and palpitations.  Gastrointestinal: as per HPI Genitourinary: Negative for dysuria, urgency, frequency and hematuria. Positive for frequent urination Musculoskeletal: Negative for myalgias, back pain and joint pain.  Skin: Negative for itching and rash.  Neurological: Negative for dizziness, tremors, focal weakness, seizures and loss of consciousness. Problem with walking and balance Endo/Heme/Allergies: Positive for seasonal allergies.  Psychiatric/Behavioral: Negative for depression, suicidal ideas and hallucinations. Positive for anxiety All other systems reviewed and are negative.   Physical Exam: There were no vitals filed for this visit. Body mass index is  25 kg/m. Gen:      No acute distress HEENT:  EOMI, sclera anicteric Neck:     No masses; no thyromegaly Lungs:    Clear to auscultation bilaterally; normal respiratory effort CV:         Regular rate and rhythm; no murmurs Abd:      + bowel sounds; soft, non-tender; no palpable masses, no distension Ext:    No edema; adequate peripheral perfusion Skin:      Warm and dry; no rash Neuro: alert and oriented x 3 Psych: normal mood and affect  Data Reviewed:  Reviewed labs, radiology imaging, old records and pertinent past GI work up   Assessment and Plan/Recommendations:  77 year old female with chronic constipation, pelvic floor dysfunction and rectocele here for follow-up visit Patient never had colonoscopy with adequate prep for colorectal cancer screening, will go ahead and schedule colonoscopy with extended bowel The risks and benefits as well as alternatives of endoscopic procedure(s) have been discussed and reviewed. All questions answered. The patient agrees to proceed.  Start low-dose Amitiza 8 mcg once daily and if able to tolerate it increased to twice daily  Benefiber 1 tablespoon 3 times daily with meal or any equivalent soluble fiber  Evidence of rectocele on rectal exam, will request MRI defecography to further evaluate.  May need surgical repair if has a large rectocele   Greater than 50% of the time used for counseling as well as treatment plan and follow-up. She had multiple questions which were answered to her satisfaction  K. Denzil Magnuson , MD [phone]    CC: Colon Branch, MD

## 2018-07-01 NOTE — Patient Instructions (Addendum)
  You have been scheduled for a colonoscopy. Please follow written instructions given to you at your visit today.  Please pick up your prep supplies at the pharmacy within the next 1-3 days. If you use inhalers (even only as needed), please bring them with you on the day of your procedure. Your physician has requested that you go to www.startemmi.com and enter the access code given to you at your visit today. This web site gives a general overview about your procedure. However, you should still follow specific instructions given to you by our office regarding your preparation for the procedure.  We will send Amitiza 8 mcg once a day to your pharmacy and have given you samples, increase to twice daily if tolerated  Take Fiberchoice 1 tablet three times a day with meals   You have been scheduled for an MRI at [facility] on 07/08/2018. Your appointment time is 3:00pm. Please arrive 15 minutes prior to your appointment time for registration purposes. Please make certain not to have anything to eat or drink 6 hours prior to your test. In addition, if you have any metal in your body, have a pacemaker or defibrillator, please be sure to let your ordering physician know. This test typically takes 45 minutes to 1 hour to complete. Should you need to reschedule, please call [phone] to do so.

## 2018-07-07 ENCOUNTER — Telehealth: Payer: Self-pay

## 2018-07-07 DIAGNOSIS — N816 Rectocele: Secondary | ICD-10-CM

## 2018-07-07 DIAGNOSIS — K5902 Outlet dysfunction constipation: Secondary | ICD-10-CM

## 2018-07-07 NOTE — Telephone Encounter (Signed)
MRI defecography ordered. This is not done anywhere but [facility]. I cancelled the MRI. The patient was not aware that this is what she was going to do. She thought it was just an MRI. Please advise.

## 2018-07-08 ENCOUNTER — Ambulatory Visit (HOSPITAL_COMMUNITY): Admission: RE | Admit: 2018-07-08 | Payer: Medicare Other | Source: Ambulatory Visit

## 2018-07-08 ENCOUNTER — Telehealth: Payer: Self-pay | Admitting: Internal Medicine

## 2018-07-08 NOTE — Telephone Encounter (Signed)
Ms Rother calling back to check status of MRI appointment that was cancelled. Advised we will be getting back to her as soon as possible.

## 2018-07-08 NOTE — Telephone Encounter (Signed)
Discussed with Dr. Kris Hartmann at [place]. They do offer MRI defecography.  Please request MRI pelvis without contrast,  and in comments defecography(dynamic imaging). Attn Dr Kris Hartmann.  Schedule at [facility] location. Advise patient to do rectal enema night before and in the morning 1-2 hours before the MRI. Thanks

## 2018-07-08 NOTE — Telephone Encounter (Signed)
Copied from [place] [id number]. Topic: Quick Communication - See Telephone Encounter >> Jul 08, 2018 12:06 PM Vernona Rieger wrote: CRM for notification. See Telephone encounter for: 07/08/18.  Patient said that she was scheduled to have a MRI done today at [facility] of her pelvis with/without contrast. They called her this morning and canceled it and said they do not perform those at [facility] and that she needed to go to [facility] on [address]. Patient would like to know what is her next step and like a nurse to call her back.

## 2018-07-08 NOTE — Telephone Encounter (Signed)
Thanks notified patient.

## 2018-07-08 NOTE — Telephone Encounter (Signed)
Dr. Silverio Decamp at GI ordered this- she will need to contact their office.

## 2018-07-09 ENCOUNTER — Telehealth: Payer: Self-pay | Admitting: Gastroenterology

## 2018-07-09 NOTE — Telephone Encounter (Signed)
Reviewed with the patient the colonoscopy prep for her procedure. Again reminded her that she does not use an enema for the colonoscopy.  The patient is again advised she will be contacted by [facility] to schedule the MRI. The MRI is the procedure or test that she will use the enema. She is to use one saline enema the night before the MRI and then again 1 to 2 hours before the MRI appointment.

## 2018-07-09 NOTE — Telephone Encounter (Signed)
I spoke with the patient at length. She agrees to the test. She is confused about the instructions. She has a colonoscopy 07/11/18. Had to review the colonoscopy prep with her.  She agreed to allow [facility] call her after 07/11/18. Then she will purchase the saline enemas. Patient states she understands this and will expect [facility] to call.

## 2018-07-11 ENCOUNTER — Encounter: Payer: Medicare Other | Admitting: Gastroenterology

## 2018-07-11 ENCOUNTER — Telehealth: Payer: Self-pay | Admitting: Gastroenterology

## 2018-07-11 NOTE — Telephone Encounter (Signed)
Patient canceled procedure for today due to not being preped but wants to know what to do for constipation until newly scheduled pre visit on 9.13.19.

## 2018-07-11 NOTE — Telephone Encounter (Signed)
I spoke with Dr Silverio Decamp and she approved the patients use of mag citrate and then she is to take Amitizia twice daily and use Benefiber three times per day.  Patient was notified and verbalized understanding.  She is to call with an update next week.

## 2018-07-15 ENCOUNTER — Encounter: Payer: Self-pay | Admitting: Gastroenterology

## 2018-07-16 ENCOUNTER — Telehealth: Payer: Self-pay | Admitting: Gastroenterology

## 2018-07-16 NOTE — Telephone Encounter (Signed)
She has had chronic bowel issue with constipation for many years now. Please advise patient to continue with the bowel regimen and await MRI defecography.

## 2018-07-16 NOTE — Telephone Encounter (Signed)
Patient advised to continue with constipation medications and plan to have the MRI.

## 2018-07-16 NOTE — Telephone Encounter (Signed)
Spoke to patient, she did have some relief this past Friday from bowel prep so she did not try the Magnesium Citrate, Amitza or benefiber. She did try Amitiza and benefiber for last day or two. She did try the Mag Citrate this morning. States she has had just a little watery relief. Patient rescheduled her procedure until 08/29/18. As noted in last ov note: patient continues to have difficulty with vulval area mass and evacuating her bowels.

## 2018-07-21 ENCOUNTER — Telehealth: Payer: Self-pay | Admitting: Gastroenterology

## 2018-07-21 NOTE — Telephone Encounter (Signed)
Pt stated that magnesium citrate worked well with constipation and she wants to know how long she needs to keep taking it.

## 2018-07-22 NOTE — Telephone Encounter (Signed)
Left message for the patient to return my call if she is having any issues or has questions.

## 2018-07-26 ENCOUNTER — Other Ambulatory Visit: Payer: Self-pay | Admitting: Hematology

## 2018-07-26 DIAGNOSIS — C50912 Malignant neoplasm of unspecified site of left female breast: Secondary | ICD-10-CM

## 2018-07-26 DIAGNOSIS — E1142 Type 2 diabetes mellitus with diabetic polyneuropathy: Secondary | ICD-10-CM

## 2018-07-26 DIAGNOSIS — M8580 Other specified disorders of bone density and structure, unspecified site: Secondary | ICD-10-CM

## 2018-07-26 DIAGNOSIS — I10 Essential (primary) hypertension: Secondary | ICD-10-CM

## 2018-07-28 ENCOUNTER — Other Ambulatory Visit: Payer: Self-pay

## 2018-07-28 ENCOUNTER — Ambulatory Visit
Admission: RE | Admit: 2018-07-28 | Discharge: 2018-07-28 | Disposition: A | Discharge status: Home / Self Care | Payer: Medicare Other | Source: Ambulatory Visit | Attending: Gastroenterology | Admitting: Gastroenterology

## 2018-07-28 ENCOUNTER — Other Ambulatory Visit: Payer: Self-pay | Admitting: Gastroenterology

## 2018-07-28 DIAGNOSIS — K5902 Outlet dysfunction constipation: Secondary | ICD-10-CM

## 2018-07-28 DIAGNOSIS — N816 Rectocele: Secondary | ICD-10-CM

## 2018-07-28 MED ORDER — METRONIDAZOLE 500 MG PO TABS: 500.0000 mg | ORAL_TABLET | Freq: Two times a day (BID) | ORAL | 0 refills | Status: AC

## 2018-07-28 MED ORDER — CIPROFLOXACIN HCL 500 MG PO TABS: 500.0000 mg | ORAL_TABLET | Freq: Two times a day (BID) | ORAL | 0 refills | Status: AC

## 2018-07-29 ENCOUNTER — Telehealth: Payer: Self-pay | Admitting: Gastroenterology

## 2018-07-29 ENCOUNTER — Encounter: Payer: Self-pay | Admitting: Internal Medicine

## 2018-07-29 NOTE — Telephone Encounter (Signed)
She has a perirectal abscess and she needs to be on antibiotics. All antibiotics can have potential side effects.

## 2018-07-29 NOTE — Telephone Encounter (Signed)
Pt called inquiring about metronidazole 5 mg and cipro 500 mg that she was just prescribed, she read the contraindications of theses meds and it is not recommended for people with neuropathy, pt wants to know if Dr. Silverio Decamp took that into consideration when she prescribed them to pt.

## 2018-07-30 NOTE — Telephone Encounter (Signed)
She agrees to take her antibiotic. Referral to [facility] marked as "Urgent."

## 2018-07-31 ENCOUNTER — Telehealth: Payer: Self-pay

## 2018-07-31 NOTE — Telephone Encounter (Signed)
The patient reports she is not having bowel movements at all. She is drinking Miralax BID. Has not started Amitiza. Passing gas. Urinating a lot which she attributes to an increased water intake. She asks if she should start the [place]. Apparently thought she should hold it while on the antibiotics. Please advise. Start the [place]? Should she purge? Denies bloating.

## 2018-07-31 NOTE — Telephone Encounter (Signed)
Yes she was advised to start Amitiza a long time ago to help improve constipation. If no improvement with Amitiza in next few days, will consider bowel purge with Miralax.

## 2018-08-04 ENCOUNTER — Telehealth: Payer: Self-pay | Admitting: Gastroenterology

## 2018-08-04 NOTE — Telephone Encounter (Signed)
Patient reports increased lack of balance on the antibiotics. She has problems walking at baseline but "this is worsening." Not vertigo.

## 2018-08-04 NOTE — Telephone Encounter (Signed)
Ok, if her symptoms worsen, she will need to come to ER.

## 2018-08-04 NOTE — Telephone Encounter (Signed)
If she is unable to tolerate the antibiotics, she may discontinue but please make sure that patient understands that the perirectal abscess may worsen. 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 Thanks

## 2018-08-04 NOTE — Telephone Encounter (Signed)
Called the patient to offer her the options. She now mentions she hasn't had any bowel movement in over a week. Clarified with the patient, nothing from the rectum at all. She can pass gas. She is taking Amitiza as directed. Denies nausea. States "starving." Pain in the LLQ that comes and goes. Nothing causes the pain. Nothing relieves it. Afebrile. Her [facility] appointment is tomorrow.

## 2018-08-05 ENCOUNTER — Other Ambulatory Visit: Payer: Self-pay

## 2018-08-05 ENCOUNTER — Telehealth: Payer: Self-pay

## 2018-08-05 ENCOUNTER — Encounter (HOSPITAL_COMMUNITY): Payer: Self-pay | Admitting: *Deleted

## 2018-08-05 ENCOUNTER — Emergency Department (HOSPITAL_COMMUNITY)
Admission: EM | Admit: 2018-08-05 | Discharge: 2018-08-05 | Disposition: A | Discharge status: Home / Self Care | Payer: Medicare Other | Attending: Emergency Medicine | Admitting: Emergency Medicine

## 2018-08-05 DIAGNOSIS — K611 Rectal abscess: Secondary | ICD-10-CM | POA: Insufficient documentation

## 2018-08-05 DIAGNOSIS — Z7984 Long term (current) use of oral hypoglycemic drugs: Secondary | ICD-10-CM | POA: Insufficient documentation

## 2018-08-05 DIAGNOSIS — Z91010 Allergy to peanuts: Secondary | ICD-10-CM | POA: Diagnosis not present

## 2018-08-05 DIAGNOSIS — Z79899 Other long term (current) drug therapy: Secondary | ICD-10-CM | POA: Diagnosis not present

## 2018-08-05 DIAGNOSIS — J45909 Unspecified asthma, uncomplicated: Secondary | ICD-10-CM | POA: Insufficient documentation

## 2018-08-05 DIAGNOSIS — Z87891 Personal history of nicotine dependence: Secondary | ICD-10-CM | POA: Insufficient documentation

## 2018-08-05 DIAGNOSIS — Z853 Personal history of malignant neoplasm of breast: Secondary | ICD-10-CM | POA: Diagnosis not present

## 2018-08-05 DIAGNOSIS — K5900 Constipation, unspecified: Secondary | ICD-10-CM | POA: Diagnosis not present

## 2018-08-05 DIAGNOSIS — E119 Type 2 diabetes mellitus without complications: Secondary | ICD-10-CM | POA: Insufficient documentation

## 2018-08-05 DIAGNOSIS — Z7982 Long term (current) use of aspirin: Secondary | ICD-10-CM | POA: Insufficient documentation

## 2018-08-05 DIAGNOSIS — I10 Essential (primary) hypertension: Secondary | ICD-10-CM | POA: Insufficient documentation

## 2018-08-05 DIAGNOSIS — I2510 Atherosclerotic heart disease of native coronary artery without angina pectoris: Secondary | ICD-10-CM | POA: Diagnosis not present

## 2018-08-05 LAB — COMPREHENSIVE METABOLIC PANEL
ALBUMIN: 4.3 g/dL (ref 3.5–5.0)
ALK PHOS: 47 U/L (ref 38–126)
ALT: 19 U/L (ref 0–44)
AST: 24 U/L (ref 15–41)
Anion gap: 12 (ref 5–15)
BILIRUBIN TOTAL: 1.1 mg/dL (ref 0.3–1.2)
BUN: 10 mg/dL (ref 8–23)
CO2: 26 mmol/L (ref 22–32)
CREATININE: 0.82 mg/dL (ref 0.44–1.00)
Calcium: 10 mg/dL (ref 8.9–10.3)
Chloride: 100 mmol/L (ref 98–111)
GFR calc Af Amer: >60 mL/min (ref >60)
GLUCOSE: 99 mg/dL (ref 70–99)
POTASSIUM: 3.7 mmol/L (ref 3.5–5.1)
Sodium: 138 mmol/L (ref 135–145)
Total Protein: 7.2 g/dL (ref 6.5–8.1)

## 2018-08-05 LAB — CBC
HEMATOCRIT: 44.7 % (ref 36.0–46.0)
Hemoglobin: 14.3 g/dL (ref 12.0–15.0)
MCH: 30 pg (ref 26.0–34.0)
MCHC: 32 g/dL (ref 30.0–36.0)
MCV: 93.9 fL (ref 78.0–100.0)
PLATELETS: 292 10*3/uL (ref 150–400)
RBC: 4.76 MIL/uL (ref 3.87–5.11)
RDW: 13.7 % (ref 11.5–15.5)
WBC: 6.2 10*3/uL (ref 4.0–10.5)

## 2018-08-05 LAB — URINALYSIS, ROUTINE W REFLEX MICROSCOPIC
BACTERIA UA: NONE SEEN
Bilirubin Urine: NEGATIVE
GLUCOSE, UA: NEGATIVE mg/dL
Ketones, ur: 5 mg/dL — AB
LEUKOCYTES UA: NEGATIVE
NITRITE: NEGATIVE
Protein, ur: NEGATIVE mg/dL
SPECIFIC GRAVITY, URINE: 1.006 (ref 1.005–1.030)
pH: 6 (ref 5.0–8.0)

## 2018-08-05 LAB — LIPASE, BLOOD: Lipase: 46 U/L (ref 11–51)

## 2018-08-05 MED ORDER — DOXYCYCLINE HYCLATE 100 MG PO CAPS: 100.0000 mg | ORAL_CAPSULE | Freq: Two times a day (BID) | ORAL | 0 refills | Status: DC

## 2018-08-05 MED ORDER — MAGNESIUM CITRATE PO SOLN: 1.0000 | Freq: Once | ORAL | 0 refills | Status: AC

## 2018-08-05 NOTE — ED Provider Notes (Signed)
[facility] EMERGENCY DEPARTMENT Provider Note   CSN: [id number] Arrival date & time: 08/05/18  1225     History   Chief Complaint Chief Complaint  Patient presents with  . Constipation    HPI [name] is a 77 y.o. female presenting for evaluation of constipation.  Level 5 caveat, patient appears confused.  Patient states that she missed her appointment with general surgery for her perirectal abscess today.  She then decided to come to the ER, as she has not had a bowel movement for a while, and she is concerned about possible side effects of her medication.  Patient states she is having increased imbalance/gait problems since she has been on the [place].  Patient states her imbalance actually started several years ago, but feels it has worsened.  Initially, patient is concerned about her constipation.  Patient initially told her primary doctor that she has not had a bowel movement in a week.  She then told me it has been 3 to 4 weeks since her last bowel movement, but she does not really remember.  Patient denies abdominal pain.  She denies postprandial nausea or vomiting.  She denies fevers, chills, chest pain, shortness of breath, urinary symptoms.  Additional history obtained from chart review.  Frequent notes mention confusion and noncompliance.  Patient was put on cipro/flagyl, ? If she is on [place], but instead was started on Cipro and Flagyl.  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   HPI  Past Medical History:  Diagnosis Date  . Allergic rhinitis   . Anal fissure   . Asthma    dx in adulthood (77 y/o) PFT 09/08/09 FEV1 1.70/81%; FEV1/FVC 0.75; +resp to dilartor; NI LV/DLCO  . Breast CA ([place])    hx of bilateral diagnosied in 2006, recurred 2011- [name]  . Chronic constipation   . Colonic polyp   . COPD (chronic obstructive pulmonary disease) ([place])   . Diabetes  mellitus   . Dislocation of right elbow 2016  . HTN (hypertension) 11-2011  . Hyperlipidemia   . OSA (obstructive sleep apnea)    CPAP intolerant  . Osteopenia   . Peripheral neuropathy   . 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 Urinary retention    During massive constipation (pelvic exam, suspect 8cm or more in diameter)    Patient Active Problem List   Diagnosis Date Noted  . Gait disorder 06/20/2018  . 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 Aortic atherosclerosis ([place]) 01/06/2017  . Ileus ([place]) 10/02/2015  . PCP NOTES >>>>>>>>>>>>>>>>>>>>>>>>>>>>>> 09/03/2015  . Recurrent cancer of left breast ([place]) 03/16/2015  . Diplopia 10/07/2014  . Chronic insomnia 08/31/2012  . GERD (gastroesophageal reflux disease) 08/31/2012  . Hypertension 11/26/2011  . Annual physical exam 08/29/2011  . Hives/ angioedema 06/22/2011  . CONSTIPATION, CHRONIC 02/05/2011  . ALLERGIC REACTION, ACUTE 11/27/2010  . HERPES SIMPLEX INFECTION 10/17/2010  . SHOULDER, PAIN 04/07/2010  . LEG PAIN 06/09/2009  . ABDOMINAL PAIN, LEFT LOWER QUADRANT 06/09/2009  . Hyperlipidemia 11/04/2007  . DM II (diabetes mellitus, type II), controlled ([place]) 08/04/2007  . OBESITY 05/16/2007  . Obstructive sleep apnea 05/16/2007  . Diabetic neuropathy ([place]) 05/16/2007  . Seasonal allergic rhinitis 05/16/2007  . Moderate persistent asthmatic bronchitis with acute exacerbation 05/16/2007  . Osteopenia 05/16/2007  . BREAST CANCER, HX OF 05/16/2007  . COLONIC POLYPS, HX OF 05/16/2007    Past Surgical History:  Procedure  Laterality Date  . ANAL RECTAL MANOMETRY N/A 04/23/2016   Procedure: ANO RECTAL MANOMETRY;  Surgeon: [name], MD;  Location: [facility] ENDOSCOPY;  Service: Endoscopy;  Laterality: N/A;  . APPENDECTOMY     08/2004  . cyct on spine      1967  . ELBOW SURGERY Right 2016   DISLOCATION  . EXPLORATORY LAPAROTOMY    . FLEXIBLE SIGMOIDOSCOPY N/A 01/31/2013   Procedure: FLEXIBLE SIGMOIDOSCOPY;   Surgeon: [name], MD;  Location: [facility] ENDOSCOPY;  Service: Endoscopy;  Laterality: N/A;  unprepped with sedation  . IR ANGIOGRAM PELVIS SELECTIVE OR SUPRASELECTIVE  05/22/2017  . IR ANGIOGRAM SELECTIVE EACH ADDITIONAL VESSEL  05/22/2017  . IR RADIOLOGIST EVAL & MGMT  04/23/2017  . IR RADIOLOGIST EVAL & MGMT  06/26/2017  . IR US GUIDE BX ASP/DRAIN  06/14/2017  . IR US GUIDE VASC ACCESS LEFT  05/22/2017  . lypoma removed from back-benign 2005     2005  . MASTECTOMY Bilateral 2006  . Pilonidal Cyst Surgery     ? of  . removal of boils     1970/1980     OB History   None      Home Medications    Prior to Admission medications   Medication Sig Start Date End Date Taking? Authorizing Provider  albuterol (VENTOLIN HFA) 108 (90 Base) MCG/ACT inhaler INHALE 2 PUFFS INTO THE LUNGS EVERY 4 HOURS AS NEEDED FOR WHEEZING OR SHORTNESS OF BREATH 06/09/18   Young, Kasandra Knudsen, MD  AMBULATORY NON FORMULARY MEDICATION Medication Name: Nitroglycerin ointment 0.125 % 23 times daily for 4-6 weeks  Use Pea sized amount per rectum 07/01/18   Mauri Pole, MD  aspirin EC 81 MG tablet Take 81 mg by mouth daily.    [provider]  budesonide-formoterol (SYMBICORT) 160-4.5 MCG/ACT inhaler Inhale 2 puffs then rinse mouth, twice daily Patient taking differently: as needed. Inhale 2 puffs then rinse mouth, twice daily 06/09/18   Baird Lyons D, MD  calcium-vitamin D (OSCAL WITH D) 250-125 MG-UNIT tablet Take 1 tablet by mouth daily.    [provider]  ciprofloxacin (CIPRO) 500 MG tablet Take 1 tablet (500 mg total) by mouth 2 (two) times daily for 14 days. 07/28/18 08/11/18  Mauri Pole, MD  doxycycline (VIBRAMYCIN) 100 MG capsule Take 1 capsule (100 mg total) by mouth 2 (two) times daily. 08/05/18   Kesa Birky, PA-C  EPINEPHrine (EPIPEN 2-PAK) 0.3 mg/0.3 mL IJ SOAJ injection Inject 0.3 mLs (0.3 mg total) into the muscle once. 04/11/15   Colon Branch, MD  fexofenadine (ALLEGRA)  180 MG tablet Take 180 mg by mouth daily.     [provider]  glucose blood (ONE TOUCH ULTRA TEST) test strip Check blood sugars daily. 05/24/17   Colon Branch, MD  letrozole [facility]) 2.5 MG tablet TAKE 1 TABLET BY MOUTH EVERY DAY 08/01/18   Alla Feeling, NP  magnesium citrate SOLN Take 296 mLs (1 Bottle total) by mouth once for 1 dose. 08/05/18 08/05/18  Correne Lalani, PA-C  magnesium hydroxide (MILK OF MAGNESIA) 400 MG/5ML suspension Take 15 mLs by mouth daily as needed for mild constipation.    [provider]  metFORMIN (GLUCOPHAGE) 1000 MG tablet Take 1 tablet (1,000 mg total) by mouth daily. 12/25/17   Colon Branch, MD  metroNIDAZOLE (FLAGYL) 500 MG tablet Take 1 tablet (500 mg total) by mouth 2 (two) times daily for 14 days. 07/28/18 08/11/18  Mauri Pole, MD  Multiple Vitamin (MULTIVITAMIN WITH MINERALS) TABS tablet Take 1 tablet by mouth daily.    [provider]  ONE TOUCH LANCETS MISC Use as directed once daily to check blood sugar.  DX E11.9 04/12/16   Colon Branch, MD  simvastatin (ZOCOR) 40 MG tablet Take 1 tablet (40 mg total) by mouth at bedtime. 05/01/18   Colon Branch, MD  traZODone (DESYREL) 50 MG tablet Take 1 tablet (50 mg total) by mouth at bedtime. 06/09/18   Deneise Lever, MD  triamterene-hydrochlorothiazide (MAXZIDE-25) 37.5-25 MG tablet Take 1 tablet by mouth daily. 04/02/18   Colon Branch, MD    Family History Family History  Problem Relation Age of Onset  . COPD Mother        smoker  . Hypertension Mother   . Lung cancer Father        died age 23  . Breast cancer Sister   . Diabetes Brother   . Breast cancer Maternal Grandmother   . Schizophrenia Son   . Autism Grandchild        x 2 grandsons  . Colon cancer Neg Hx   . Heart attack Neg Hx        no h/o early diseae    Social History Social History   Tobacco Use  . Smoking status: Former Smoker    Packs/day: 1.00    Years: 4.00    Pack years: 4.00    Types: Cigarettes     Last attempt to quit: 04/01/1968    Years since quitting: 50.3  . Smokeless tobacco: Never Used  . Tobacco comment: Quit at age 77  Substance Use Topics  . Alcohol use: No  . Drug use: No     Allergies   Peanut-containing drug products and Penicillins   Review of Systems Review of Systems  Gastrointestinal: Positive for constipation.  Neurological:       Gait imbalance, chronic  All other systems reviewed and are negative.    Physical Exam Updated Vital Signs BP (!) 147/87 (BP Location: Right Arm)   Pulse 71   Temp 97.8 F (36.6 C) (Oral)   Resp 16   Ht 5\' 3"  (1.6 m)   Wt 64.9 kg   SpO2 100%   BMI 25.33 kg/m   Physical Exam  Constitutional: She is oriented to person, place, and time. She appears well-developed and well-nourished. No distress.  Elderly female in NAD  HENT:  Head: Normocephalic and atraumatic.  Eyes: Pupils are equal, round, and reactive to light. Conjunctivae and EOM are normal.  Neck: Normal range of motion. Neck supple.  Cardiovascular: Normal rate, regular rhythm and intact distal pulses.  Pulmonary/Chest: Effort normal and breath sounds normal. No respiratory distress. She has no wheezes.  Abdominal: Soft. She exhibits no distension and no mass. There is no tenderness. There is no rebound and no guarding.  No obvious distention, rigidity, or guarding.  No tenderness. no masses  Musculoskeletal: Normal range of motion.  Neurological: She is alert and oriented to person, place, and time. No cranial nerve deficit or sensory deficit. Coordination normal.  Alert and oriented. No obvious difficulty with nose to finger. Strength intact x4, sensation intact x4. Pt very unsteady while walking, pt states this is chronic.   Skin: Skin is warm and dry. Capillary refill takes less than 2 seconds.  Psychiatric: She has a normal mood and affect.  Nursing note and vitals reviewed.    ED Treatments / Results  Labs (all  labs ordered are listed, but only  abnormal results are displayed) Labs Reviewed  URINALYSIS, ROUTINE W REFLEX MICROSCOPIC - Abnormal; Notable for the following components:      Result Value   Color, Urine STRAW (*)    Hgb urine dipstick LARGE (*)    Ketones, ur 5 (*)    All other components within normal limits  LIPASE, BLOOD  COMPREHENSIVE METABOLIC PANEL  CBC    EKG None  Radiology No results found.  Procedures Procedures (including critical care time)  Medications Ordered in ED Medications - No data to display   Initial Impression / Assessment and Plan / ED Course  I have reviewed the triage vital signs and the nursing notes.  Pertinent labs & imaging results that were available during my care of the patient were reviewed by me and considered in my medical decision making (see chart for details).     Pt presenting for evaluation of constipation and gait imbalance.  Neither issue appears to be new today.  Gait and balance is been going on for several years.  No focal neuro deficits.  Patient is ambulatory with mild gait difficulty, but without new findings, I do not believe pursuing CT head/MRI is necessary today, as I doubt acute CVA or intracranial bleed.  Patient reporting constipation.  No abdominal pain, or postprandial nausea or vomiting.  No fevers or chills.  Had recent MRI which shows perirectal abscess and anorectal fistula. As she is without pain and still tolerating PO, doubt obstruction. Patient was supposed to follow-up outpatient with general surgery, missed her appointment today.  Discussed with attending, Dr. Tomi Bamberger evaluated the patient.  Recommends consult with general surgery to see if there is anything to be done in the ER. Discussed with Dr. Ninfa Linden from general surgery, who recommends outpatient follow-up.  States if patient is not tolerating Cipro Flagyl, consider witching to different antibiotic such as doxycycline.  No further imaging needed today in the ER.  Discussed with patient.   Patient states she has resolved constipation in the past with magnesium citrate, will prescribe a bottle and switch patient's antibiotic.  Discussed importance of follow-up with general surgery.  Discussed follow-up with PCP for evaluation of gait.  At this time, patient appears safe for discharge.  Return precautions given.  Patient states she understands and agrees to plan.  Final Clinical Impressions(s) / ED Diagnoses   Final diagnoses:  Constipation, unspecified constipation type  Perirectal abscess    ED Discharge Orders         Ordered    doxycycline (VIBRAMYCIN) 100 MG capsule  2 times daily     08/05/18 1747    magnesium citrate SOLN   Once     08/05/18 1747           Franchot Heidelberg, PA-C 08/05/18 1913    Dorie Rank, MD 08/06/18 [phone]

## 2018-08-05 NOTE — Telephone Encounter (Signed)
Ok, she is non compliant with the recommendation.

## 2018-08-05 NOTE — ED Triage Notes (Signed)
Pt had MRI 07/28/18 that confirmed a perirectal abcess that she is being treated PO Flagyl with reported noncompliance by GI MD, Eustaquio Maize, RN from GI office called to provide information and concern re: pt

## 2018-08-05 NOTE — ED Triage Notes (Signed)
Pt in c/o abd pain and constipation, last BM x 1 wk ago, pt suppose to see PCP for this today and pain in LLQ abd pain started, pt denies n/v, A&O x4

## 2018-08-05 NOTE — Discharge Instructions (Signed)
Continue taking home medications as prescribed. Stop taking the Cipro and Flagyl, start taking doxycycline twice a day. Use magnesium citrate as needed for constipation. Make sure you are staying well-hydrated with water. It is very important that you follow-up with general surgery for further evaluation management of your perirectal abscess. Follow-up with your primary care doctor as needed to discuss your gait difficulties. Return to the emergency room with any new, worsening, or concerning symptoms.

## 2018-08-05 NOTE — Telephone Encounter (Signed)
FYI

## 2018-08-05 NOTE — Telephone Encounter (Signed)
Spoke with the patient. Advised of the recommendations. Her at appointment with the surgeon is 11:45 am. She understands she is advised to go to the ER if she is experiencing an increase of pain or if she is not moving her bowels. She continues to states she has not had any bowel movements.

## 2018-08-05 NOTE — ED Notes (Signed)
Patient verbalizes understanding of discharge instructions. Opportunity for questioning and answers were provided. Armband removed by staff, pt discharged from ED in wheelchair.  

## 2018-08-06 ENCOUNTER — Ambulatory Visit: Payer: Self-pay | Admitting: Internal Medicine

## 2018-08-06 ENCOUNTER — Telehealth: Payer: Self-pay | Admitting: Gastroenterology

## 2018-08-06 DIAGNOSIS — K5900 Constipation, unspecified: Secondary | ICD-10-CM

## 2018-08-06 NOTE — Telephone Encounter (Signed)
I returned her call.    She is c/o being constipated.   Unable to have a BM for 10 days.    She went to the ED yesterday for constipation.   She was started on Doxycycline for an abscess near/in her rectum.   Also prescribed Mag citrate.    She is taking the mag citrate twice today.   Once this morning and again at lunch she drank a bottle.  She told me she had called all of her doctors today looking for relief.    She was supposed to see a doctor that she missed the appt for.   It was difficult to triage her because she kept jumping around in her conversation.    She told me she had not gotten the prescriptions filled that were given to her at the ED but then tells me she is taking the antibiotic and has drank the bottle of mag citrate.     We got disconnected but by this time there was not anything else I could offer her except to return to the ED if she still felt she needed relief.  She called back and we were reconnected.   I let her know she could always go back to the ED and she said she didn't want to go wait there for hours.    I encouraged her to continue drinking warm fluids, that is was ok to eat soft foods.    I routed this note to Dr. Larose Kells so he would be aware of her circumstances.   Reason for Disposition . Last bowel movement (BM) > 4 days ago  Answer Assessment - Initial Assessment Questions 1. STOOL PATTERN OR FREQUENCY: "How often do you pass bowel movements (BMs)?"  (Normal range: tid to q 3 days)  "When was the last BM passed?"       I was in the ED all day yesterday with constipation.    I missed an appt with my doctor.   I need to see someone to get relief.   I saw GI doctor.  I was supposed to have a conference with Dr. Lovena Le.   They thought I need surgery on a fissure I have.   Last BM 10 days ago.   I have not gotten the prescription filled from the ED.    I'm taking the medicine that the GI doctor gave me.    I had 2 bottles of mag citrate.  1st one this morning and 2nd  bottle at noon today.    I'm starving at this point.   Explain this to me I asked:   I was in the ED all day yesterday.   I was told not to eat but to drink plenty of liquids.   I talked with a nurse earlier today.     I talked with my GI doctor's nurse today, I talked with Dr. Ethel Rana office, and I called Dr. Tanna Furry office today.      I just want help.   Why can't I get relief?   If I have an infection what then?   She is taking the antibiotic prescribed to her in the ED.   2. STRAINING: "Do you have to strain to have a BM?"      I need permission to eat.   I suggested she eat some soft foods to start with. 3. RECTAL PAIN: "Does your rectum hurt when the stool comes out?" If so, ask: "Do you have hemorrhoids? How  bad is the pain?"  (Scale 1-10; or mild, moderate, severe)     I have a fissure on my left side and compacted bowel movement.    Pain is a 3 on the scale. 4. STOOL COMPOSITION: "Are the stools hard?"      Not had one. 5. BLOOD ON STOOLS: "Has there been any blood on the toilet tissue or on the surface of the BM?" If so, ask: "When was the last time?"      Not had a stool. 6. CHRONIC CONSTIPATION: "Is this a new problem for you?"  If no, ask: "How long have you had this problem?" (days, weeks, months)      No 7. CHANGES IN DIET: "Have there been any recent changes in your diet?"      No 8. MEDICATIONS: "Have you been taking any new medications?"     No   Just the 2 my GI doctor recommended for the infection. 9. LAXATIVES: "Have you been using any laxatives or enemas?"  If yes, ask "What, how often, and when was the last time?"     I'm taking a laxative.    Amitza. 10. CAUSE: "What do you think is causing the constipation?"        I don't know. 11. OTHER SYMPTOMS: "Do you have any other symptoms?" (e.g., abdominal pain, fever, vomiting)       No 12. PREGNANCY: "Is there any chance you are pregnant?" "When was your last menstrual period?"       Not asked due to age  Protocols used:  CONSTIPATION-A-AH

## 2018-08-06 NOTE — Addendum Note (Signed)
Addended by: Kathlene November E on: 08/06/2018 04:35 PM   Modules accepted: Orders

## 2018-08-06 NOTE — Telephone Encounter (Signed)
Spoke with the patient. She states she has decided to restart the Cipro and Flagyl. Does not plan to take the medication (doxycycline) prescribed at the ER. States she has not any antibiotics yet today because she cannot remember if she can take them with food or not. She reports she has not called the surgeon's office to reschedule her appointment however she has scheduled an appointment with Dr Larose Kells. She states she has taken 2 bottles of magnesium citrate and no bowel movement.  Advised to take her medications and call the surgeon office to reschedule.

## 2018-08-06 NOTE — Telephone Encounter (Signed)
ok 

## 2018-08-06 NOTE — Telephone Encounter (Signed)
She really needs to see surgery as she has a perirectal abscess.  Continue antibiotics, if she gets worse, has fever chills: needs to go back to the ER. As far as constipation, this is a chronic problem.  If so desired, I have a prescription ready to be sent for lactulose, she can take this 3 times a day as needed until has a BM. Otherwise follow-up with me as a schedule although her  problems are better addressed by GI and surgery.

## 2018-08-06 NOTE — Telephone Encounter (Signed)
Routed to Dr. Larose Kells to review and advise. Scheduled to see Dr. Larose Kells 8/30.

## 2018-08-07 MED ORDER — LACTULOSE 10 GM/15ML PO SOLN: 10.0000 g | Freq: Three times a day (TID) | ORAL | 2 refills | Status: AC | PRN

## 2018-08-07 NOTE — Addendum Note (Signed)
Addended by: Raynelle Dick R on: 08/07/2018 11:38 AM   Modules accepted: Orders

## 2018-08-07 NOTE — Telephone Encounter (Signed)
Pt. stated she finally had a BM after taking some aloe vera, and it was small, soft, and formed, "like an earthworm". Author advised to try lactulose that Dr. Larose Kells prescribed; medication reviewed with pt. And sent to preferred pharmacy, pt. Appreciative. Pt. States she was unable to reschedule an appointment with gastroenterologist, and wanted to see Dr. Larose Kells still even though following up with GI was the best course of action per Dr. Larose Kells; pt. Verbalized understanding. Pt. concerned about her unsteady gait that persists, stating "I've already had a CT scan and MRI", which was confirmed 12/2017 and 03/2018 respectively. Routed to Dr. Larose Kells to review concerns for pending 8/30 appointment.

## 2018-08-08 ENCOUNTER — Ambulatory Visit: Payer: Medicare Other | Admitting: Internal Medicine

## 2018-08-13 NOTE — Assessment & Plan Note (Signed)
She thinks she is coughing a little more with no acute change.  I would like to let her try another maintenance inhaler. Plan-try Symbicort

## 2018-08-13 NOTE — Assessment & Plan Note (Signed)
Having more difficulty maintaining sleep, nonspecific.  We discussed basic sleep hygiene again and her history of OSA with CPAP intolerance. Plan-try trazodone

## 2018-08-14 ENCOUNTER — Encounter: Payer: Self-pay | Admitting: Internal Medicine

## 2018-08-14 ENCOUNTER — Ambulatory Visit: Payer: Medicare Other | Admitting: Internal Medicine

## 2018-08-14 VITALS — BP 128/82 | HR 91 | Temp 98.4°F | Resp 16 | Ht 63.5 in | Wt 141.1 lb

## 2018-08-14 DIAGNOSIS — F419 Anxiety disorder, unspecified: Secondary | ICD-10-CM

## 2018-08-14 DIAGNOSIS — K5909 Other constipation: Secondary | ICD-10-CM | POA: Diagnosis not present

## 2018-08-14 DIAGNOSIS — R269 Unspecified abnormalities of gait and mobility: Secondary | ICD-10-CM

## 2018-08-14 MED ORDER — ESCITALOPRAM OXALATE 10 MG PO TABS: 10.0000 mg | ORAL_TABLET | Freq: Every day | ORAL | 1 refills | Status: AC

## 2018-08-14 NOTE — Patient Instructions (Signed)
Schedule a office visit for a follow-up 6 weeks from today  Start taking Lexapro, watch for side effects  Strongly recommended to see a counselor

## 2018-08-14 NOTE — Progress Notes (Signed)
Pre visit review using our clinic review tool, if applicable. No additional management support is needed unless otherwise documented below in the visit note. 

## 2018-08-14 NOTE — Progress Notes (Signed)
Subjective:    Patient ID: [name], female    DOB: 25-Feb-1941, 77 y.o.   MRN: [id number]  DOS:  08/14/2018 Type of visit - description : ED f/u Interval history: She is here for a f/u, was seen by GI and then the  ED , chart is reviewed, see below. Her daughter is with her and they actually have much more pressing concerns than the ED follow-up.  Chart reviewed: Was seen by GI 07/01/2018, due to constipation and diarrhea.  At that time she reported that bulging area in her vulva. 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 Also show a 3.4 cm perirectal abscess at the right gluteal region. 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 There was some suggestion of global pelvic dysfunction. Was referred to surgery, for some reason she could not go to that appointment and eventually went to the ER with severe constipation. She had no abdominal pain or nausea. ER doctor consulted with general surgery regards the abscess, she was not tolerating Cipro and Flagyl and was switched to doxycycline.  The patient however is a still taking Cipro and Flagyl.  Never tried doxycycline Lipase, CMP and CBC were satisfactory.  Review of Systems  As far as the constipation, that seems to be better, taking a OTC As far as the perirectal abscess, reportedly saw Dr. Marcelene Butte surgeon in [place] and she was clear from their side.  Furthermore, she denies fever, chills, rectal pain. Her main concern today is stress, she lives with her son who has a schizophrenia, he is not doing well, she feels unsafe in that environment. Due to his stress, she feels her memory is much worse and request a formal evaluation. She is independent, she organize her own shopping list and go shopping without problems. She pays her own bills.   Past Medical History:  Diagnosis Date  . Allergic rhinitis   . Anal fissure   . Asthma    dx in adulthood (77 y/o) PFT 09/08/09 FEV1 1.70/81%;  FEV1/FVC 0.75; +resp to dilartor; NI LV/DLCO  . Breast CA ([place])    hx of bilateral diagnosied in 2006, recurred 2011- [name]  . Chronic constipation   . Colonic polyp   . COPD (chronic obstructive pulmonary disease) ([place])   . Diabetes mellitus   . Dislocation of right elbow 2016  . HTN (hypertension) 11-2011  . Hyperlipidemia   . OSA (obstructive sleep apnea)    CPAP intolerant  . Osteopenia   . Peripheral neuropathy   . 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 Urinary retention    During massive constipation (pelvic exam, suspect 8cm or more in diameter)    Past Surgical History:  Procedure Laterality Date  . ANAL RECTAL MANOMETRY N/A 04/23/2016   Procedure: ANO RECTAL MANOMETRY;  Surgeon: [name], MD;  Location: [facility];  Service: Endoscopy;  Laterality: N/A;  . APPENDECTOMY     08/2004  . cyct on spine      1967  . ELBOW SURGERY Right 2016   DISLOCATION  . EXPLORATORY LAPAROTOMY    . FLEXIBLE SIGMOIDOSCOPY N/A 01/31/2013   Procedure: FLEXIBLE SIGMOIDOSCOPY;  Surgeon: [name], MD;  Location: [facility] ENDOSCOPY;  Service: Endoscopy;  Laterality: N/A;  unprepped with sedation  . IR ANGIOGRAM PELVIS SELECTIVE OR SUPRASELECTIVE  05/22/2017  . IR ANGIOGRAM SELECTIVE EACH ADDITIONAL VESSEL  05/22/2017  . IR RADIOLOGIST EVAL & MGMT  04/23/2017  . IR RADIOLOGIST EVAL & MGMT  06/26/2017  . IR US GUIDE BX ASP/DRAIN  06/14/2017  . IR US GUIDE VASC ACCESS LEFT  05/22/2017  . lypoma removed from back-benign 2005     2005  . MASTECTOMY Bilateral 2006  . Pilonidal Cyst Surgery     ? of  . removal of boils     1970/1980    Social History   Socioeconomic History  . Marital status: Widowed    Spouse name: Not on file  . Number of children: 2  . Years of education: Not on file  . Highest education level: Not on file  Occupational History  . Occupation: retired    Fish farm manager: RETIRED  Social Needs  . Financial resource strain: Not on file  .  Food insecurity:    Worry: Not on file    Inability: Not on file  . Transportation needs:    Medical: Not on file    Non-medical: Not on file  Tobacco Use  . Smoking status: Former Smoker    Packs/day: 1.00    Years: 4.00    Pack years: 4.00    Types: Cigarettes    Last attempt to quit: 04/01/1968    Years since quitting: 50.4  . Smokeless tobacco: Never Used  . Tobacco comment: Quit at age 69  Substance and Sexual Activity  . Alcohol use: No  . Drug use: No  . Sexual activity: Not Currently  Lifestyle  . Physical activity:    Days per week: Not on file    Minutes per session: Not on file  . Stress: Not on file  Relationships  . Social connections:    Talks on phone: Not on file    Gets together: Not on file    Attends religious service: Not on file    Active member of club or organization: Not on file    Attends meetings of clubs or organizations: Not on file    Relationship status: Not on file  . Intimate partner violence:    Fear of current or ex partner: Not on file    Emotionally abused: Not on file    Physically abused: Not on file    Forced sexual activity: Not on file  Other Topics Concern  . 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 husband 4-10, her disable child lives w/ her, he does most of physical work at home    Does drive, plays cards, manage her finances, cook   retired Pharmacist, hospital   2 children    [place] twins        Allergies as of 08/14/2018      Reactions   Peanut-containing Drug Products Swelling   Penicillins Itching   Has patient had a PCN reaction causing immediate rash, facial/tongue/throat swelling, SOB or lightheadedness with hypotension: No Has patient had a PCN reaction causing severe rash involving mucus membranes or skin necrosis: No Has patient had a PCN reaction that required hospitalization: No Has patient had a PCN reaction occurring within the last 10 years: No If all of the above answers are "NO", then may proceed with  Cephalosporin use. "outer rectal itching"      Medication List        Accurate as of 08/14/18  5:24 PM. Always use your most recent med list.          albuterol 108 (90 Base) MCG/ACT inhaler Commonly known as:  PROVENTIL HFA;VENTOLIN HFA INHALE 2 PUFFS INTO THE LUNGS EVERY 4 HOURS AS NEEDED FOR  WHEEZING OR SHORTNESS OF BREATH   AMBULATORY NON FORMULARY MEDICATION Medication Name: Nitroglycerin ointment 0.125 % 23 times daily for 4-6 weeks  Use Pea sized amount per rectum   aspirin EC 81 MG tablet Take 81 mg by mouth daily.   budesonide-formoterol 160-4.5 MCG/ACT inhaler Commonly known as:  SYMBICORT Inhale 2 puffs then rinse mouth, twice daily   calcium-vitamin D 250-125 MG-UNIT tablet Commonly known as:  OSCAL WITH D Take 1 tablet by mouth daily.   ciprofloxacin 500 MG tablet Commonly known as:  CIPRO Take 500 mg by mouth 2 (two) times daily.   EPINEPHrine 0.3 mg/0.3 mL Soaj injection Commonly known as:  EPI-PEN Inject 0.3 mLs (0.3 mg total) into the muscle once.   escitalopram 10 MG tablet Commonly known as:  LEXAPRO Take 1 tablet (10 mg total) by mouth daily.   fexofenadine 180 MG tablet Commonly known as:  ALLEGRA Take 180 mg by mouth daily.   glucose blood test strip Check blood sugars daily.   lactulose 10 GM/15ML solution Commonly known as:  CHRONULAC Take 15 mLs (10 g total) by mouth 3 (three) times daily as needed.   letrozole 2.5 MG tablet Commonly known as:  FEMARA TAKE 1 TABLET BY MOUTH EVERY DAY   magnesium hydroxide 400 MG/5ML suspension Commonly known as:  MILK OF MAGNESIA Take 15 mLs by mouth daily as needed for mild constipation.   metFORMIN 1000 MG tablet Commonly known as:  GLUCOPHAGE Take 1 tablet (1,000 mg total) by mouth daily.   metroNIDAZOLE 500 MG tablet Commonly known as:  FLAGYL Take 500 mg by mouth 2 (two) times daily.   multivitamin with minerals Tabs tablet Take 1 tablet by mouth daily.   ONE TOUCH LANCETS Misc Use  as directed once daily to check blood sugar.  DX E11.9   simvastatin 40 MG tablet Commonly known as:  ZOCOR Take 1 tablet (40 mg total) by mouth at bedtime.   traZODone 50 MG tablet Commonly known as:  DESYREL Take 1 tablet (50 mg total) by mouth at bedtime.   triamterene-hydrochlorothiazide 37.5-25 MG tablet Commonly known as:  MAXZIDE-25 Take 1 tablet by mouth daily.          Objective:   Physical Exam BP 128/82 (BP Location: Left Arm, Patient Position: Sitting, Cuff Size: Small)   Pulse 91   Temp 98.4 F (36.9 C) (Oral)   Resp 16   Ht 5' 3.5" (1.613 m)   Wt 141 lb 2 oz (64 kg)   SpO2 98%   BMI 24.61 kg/m   General:   Well developed, NAD, see BMI.  HEENT:  Normocephalic . Face symmetric, atraumatic Skin: Not pale. Not jaundice Neurologic:  alert & oriented X3.  Speech normal, gait somewhat wide-based, uses a cane.  Seems at baseline.    Psych--  Cognition and judgment appear intact.  Cooperative with normal attention span and concentration.  Behavior appropriate. No anxious or depressed appearing.      Assessment & Plan:   Assessment   DM  Neuropathy (neg sx, + numbness) HTN Hyperlipidemia Mild hypercalcemia, normal PTH 09-2014 GI: Chronic, severe constipation:  s/p several colonoscopies, very limited d/p poor prep unable to finish exam . Virtual CT 2011 unrevealing . Usually good response to MOM, admitted 2014 d/t impaction, had a flex sig. Virtual cscope 11-2015 neg GB stones  Pulmonary Dr Annamaria Boots --COPD, asthma --OSA CPAP intolerant --Nodules noted on CT-2017, stable. Osteoporosis  (osteopenia plus vertebral Fx per CT), [facility]  2011 normal DEXA; T score 05/2012 -0.9  T score 05-2014 T score -1.37 Oncology  Breast cancer bilateral 2006 --s/ p B mastectomy   --Recurrence L mastectomy site  2011, s/p excision >> XRT, Letrozole For the next 10 years, end 2021 Diplopia, gait disorder: Saw neurology 2015/ 2016. TSH, myasthenia panel were negative.  MRI brain no acute Gait d/o: chronic imbalance since ~ 2016 Acute urinary retention 08/22/2015  PLAN: Chronic, severe constipation: Patient decided not to take Amitiza, recommend to discuss with GI.  At this point, things are going okay taking OTC Aloe. Perirectal abscess: Per MRI, reportedly saw Dr. Radford Pax in [place] and she was cleared.  Still taking Cipro and Flagyl. Anxiety: This was the patient's main concern today, she was extensively counseled, I listen to all her concerns and the daughter's concerns. Recommend to work on this living situation, needs to make her living environment safe; needs to seek counseling >>> information provided. We talk about medications, she elected to try SSRIs.  We will do her Lexapro daily, side effects and potential for suicidal ideas discussed. A letter was provided acknowledging that she feels unsafe at home. Forgetful?  MMSE is completely normal she is scored 29 points Lack of balance: Reportedly slightly worse, we discussed possibly neurology referral but we elected to wait, she is very busy at home dealing with her son's illness. RTC 6 weeks  Today, I spent more than  42  min with the patient: >50% of the time counseling regards anxiety, doing a detailed memory exam, listening to her concerns, coordinating her care. Also: -reviewing the chart and labs ordered by other providers

## 2018-08-14 NOTE — Assessment & Plan Note (Signed)
Chronic, severe constipation: Patient decided not to take Amitiza, recommend to discuss with GI.  At this point, things are going okay taking OTC Aloe. Perirectal abscess: Per MRI, reportedly saw Dr. Radford Pax in [place] and she was cleared.  Still taking Cipro and Flagyl. Anxiety: This was the patient's main concern today, she was extensively counseled, I listen to all her concerns and the daughter's concerns. Recommend to work on this living situation, needs to make her living environment safe; needs to seek counseling >>> information provided. We talk about medications, she elected to try SSRIs.  We will do her Lexapro daily, side effects and potential for suicidal ideas discussed. A letter was provided acknowledging that she feels unsafe at home. Forgetful?  MMSE is completely normal she is scored 29 points Lack of balance: Reportedly slightly worse, we discussed possibly neurology referral but we elected to wait, she is very busy at home dealing with her son's illness. RTC 6 weeks

## 2018-08-17 ENCOUNTER — Other Ambulatory Visit: Payer: Self-pay | Admitting: Gastroenterology

## 2018-08-22 ENCOUNTER — Other Ambulatory Visit: Payer: Self-pay | Admitting: Gastroenterology

## 2018-08-22 NOTE — Telephone Encounter (Signed)
Dr Silverio Decamp Can I refill this med for this patient

## 2018-08-24 ENCOUNTER — Emergency Department (HOSPITAL_COMMUNITY)
Admission: EM | Admit: 2018-08-24 | Discharge: 2018-08-27 | Disposition: A | Discharge status: Other Acute Inpatient Hospital | Payer: Medicare Other | Attending: Emergency Medicine | Admitting: Emergency Medicine

## 2018-08-24 ENCOUNTER — Ambulatory Visit (HOSPITAL_COMMUNITY)
Admission: EM | Admit: 2018-08-24 | Discharge: 2018-08-24 | Disposition: A | Discharge status: Home / Self Care | Payer: Medicare Other | Source: Home / Self Care

## 2018-08-24 ENCOUNTER — Other Ambulatory Visit: Payer: Self-pay

## 2018-08-24 ENCOUNTER — Encounter (HOSPITAL_COMMUNITY): Payer: Self-pay | Admitting: Emergency Medicine

## 2018-08-24 DIAGNOSIS — G4700 Insomnia, unspecified: Secondary | ICD-10-CM

## 2018-08-24 DIAGNOSIS — I10 Essential (primary) hypertension: Secondary | ICD-10-CM | POA: Insufficient documentation

## 2018-08-24 DIAGNOSIS — Z801 Family history of malignant neoplasm of trachea, bronchus and lung: Secondary | ICD-10-CM | POA: Insufficient documentation

## 2018-08-24 DIAGNOSIS — N3001 Acute cystitis with hematuria: Secondary | ICD-10-CM

## 2018-08-24 DIAGNOSIS — Z803 Family history of malignant neoplasm of breast: Secondary | ICD-10-CM | POA: Insufficient documentation

## 2018-08-24 DIAGNOSIS — Z87891 Personal history of nicotine dependence: Secondary | ICD-10-CM | POA: Insufficient documentation

## 2018-08-24 DIAGNOSIS — Z853 Personal history of malignant neoplasm of breast: Secondary | ICD-10-CM

## 2018-08-24 DIAGNOSIS — Z79899 Other long term (current) drug therapy: Secondary | ICD-10-CM

## 2018-08-24 DIAGNOSIS — Z91010 Allergy to peanuts: Secondary | ICD-10-CM | POA: Insufficient documentation

## 2018-08-24 DIAGNOSIS — K219 Gastro-esophageal reflux disease without esophagitis: Secondary | ICD-10-CM

## 2018-08-24 DIAGNOSIS — E1142 Type 2 diabetes mellitus with diabetic polyneuropathy: Secondary | ICD-10-CM | POA: Insufficient documentation

## 2018-08-24 DIAGNOSIS — Z7951 Long term (current) use of inhaled steroids: Secondary | ICD-10-CM

## 2018-08-24 DIAGNOSIS — M8580 Other specified disorders of bone density and structure, unspecified site: Secondary | ICD-10-CM | POA: Insufficient documentation

## 2018-08-24 DIAGNOSIS — E785 Hyperlipidemia, unspecified: Secondary | ICD-10-CM

## 2018-08-24 DIAGNOSIS — Z046 Encounter for general psychiatric examination, requested by authority: Secondary | ICD-10-CM | POA: Diagnosis not present

## 2018-08-24 DIAGNOSIS — F22 Delusional disorders: Secondary | ICD-10-CM | POA: Insufficient documentation

## 2018-08-24 DIAGNOSIS — Z8249 Family history of ischemic heart disease and other diseases of the circulatory system: Secondary | ICD-10-CM | POA: Insufficient documentation

## 2018-08-24 DIAGNOSIS — E119 Type 2 diabetes mellitus without complications: Secondary | ICD-10-CM | POA: Insufficient documentation

## 2018-08-24 DIAGNOSIS — Z825 Family history of asthma and other chronic lower respiratory diseases: Secondary | ICD-10-CM | POA: Insufficient documentation

## 2018-08-24 DIAGNOSIS — Z7982 Long term (current) use of aspirin: Secondary | ICD-10-CM | POA: Insufficient documentation

## 2018-08-24 DIAGNOSIS — G4733 Obstructive sleep apnea (adult) (pediatric): Secondary | ICD-10-CM

## 2018-08-24 DIAGNOSIS — Z833 Family history of diabetes mellitus: Secondary | ICD-10-CM

## 2018-08-24 DIAGNOSIS — J45909 Unspecified asthma, uncomplicated: Secondary | ICD-10-CM | POA: Diagnosis not present

## 2018-08-24 DIAGNOSIS — R45851 Suicidal ideations: Secondary | ICD-10-CM | POA: Diagnosis not present

## 2018-08-24 DIAGNOSIS — E669 Obesity, unspecified: Secondary | ICD-10-CM | POA: Insufficient documentation

## 2018-08-24 DIAGNOSIS — Z818 Family history of other mental and behavioral disorders: Secondary | ICD-10-CM | POA: Insufficient documentation

## 2018-08-24 DIAGNOSIS — F419 Anxiety disorder, unspecified: Secondary | ICD-10-CM | POA: Insufficient documentation

## 2018-08-24 DIAGNOSIS — R4182 Altered mental status, unspecified: Secondary | ICD-10-CM | POA: Insufficient documentation

## 2018-08-24 DIAGNOSIS — F332 Major depressive disorder, recurrent severe without psychotic features: Secondary | ICD-10-CM | POA: Insufficient documentation

## 2018-08-24 DIAGNOSIS — Z880 Allergy status to penicillin: Secondary | ICD-10-CM

## 2018-08-24 DIAGNOSIS — Z7984 Long term (current) use of oral hypoglycemic drugs: Secondary | ICD-10-CM

## 2018-08-24 DIAGNOSIS — J449 Chronic obstructive pulmonary disease, unspecified: Secondary | ICD-10-CM

## 2018-08-24 DIAGNOSIS — F29 Unspecified psychosis not due to a substance or known physiological condition: Secondary | ICD-10-CM

## 2018-08-24 DIAGNOSIS — Z86010 Personal history of colonic polyps: Secondary | ICD-10-CM | POA: Insufficient documentation

## 2018-08-24 LAB — POCT URINALYSIS DIP (DEVICE)
Glucose, UA: NEGATIVE mg/dL
Ketones, ur: 15 mg/dL — AB
Nitrite: NEGATIVE
PH: 6 (ref 5.0–8.0)
Protein, ur: NEGATIVE mg/dL
Specific Gravity, Urine: 1.015 (ref 1.005–1.030)
UROBILINOGEN UA: 0.2 mg/dL (ref 0.0–1.0)

## 2018-08-24 MED ORDER — CIPROFLOXACIN HCL 500 MG PO TABS: 500.0000 mg | ORAL_TABLET | Freq: Two times a day (BID) | ORAL | 0 refills | Status: AC

## 2018-08-24 NOTE — ED Triage Notes (Signed)
Pt BIB EMS from home with complaints of paranoia and anxiety. Patient taking abx for UTI that she received today. Patient read side effects and saw death was a side effect and is now paranoid and anxious that she is going to die. Paranoia noted at urgent care.

## 2018-08-24 NOTE — ED Provider Notes (Signed)
[facility]    CSN: [id number] Arrival date & time: 08/24/18  1427   History   Chief Complaint Chief Complaint  Patient presents with  . Urinary Tract Infection    HPI [name] is a 77 y.o. female.   UTI Symptoms and Hematuria  This is a 77 y.o. female who presents today with UTI symptoms for few days.  Concern today as urine had streaks of blood present. Complains of dysuria, frequency,urgency, and hematuria. Urine has strong odor. Daughter is concerned as mother's cognitive status has changed over the course of the last 8 days.  Patient has been followed by Dr. Larose Kells her primary care provider who performed an MMSE and patient scored a 29.  Patient was referred for counseling and recommended to start Lexapro however she has not started medication nor has she followed up with mental health services.  Patient denies any chills, abdominal pain, or incontinence episodes.  Past Medical History:  Diagnosis Date  . Allergic rhinitis   . Anal fissure   . Asthma    dx in adulthood (77 y/o) PFT 09/08/09 FEV1 1.70/81%; FEV1/FVC 0.75; +resp to dilartor; NI LV/DLCO  . Breast CA ([place])    hx of bilateral diagnosied in 2006, recurred 2011- [name]  . Chronic constipation   . Colonic polyp   . COPD (chronic obstructive pulmonary disease) ([place])   . Diabetes mellitus   . Dislocation of right elbow 2016  . HTN (hypertension) 11-2011  . Hyperlipidemia   . OSA (obstructive sleep apnea)    CPAP intolerant  . Osteopenia   . Peripheral neuropathy   . 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 Urinary retention    During massive constipation (pelvic exam, suspect 8cm or more in diameter)    Patient Active Problem List   Diagnosis Date Noted  . Gait disorder 06/20/2018  . 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 Aortic atherosclerosis ([employer]) 01/06/2017  . Ileus ([place]) 10/02/2015  . PCP NOTES >>>>>>>>>>>>>>>>>>>>>>>>>>>>>> 09/03/2015  .  Recurrent cancer of left breast ([place]) 03/16/2015  . Diplopia 10/07/2014  . Insomnia 08/31/2012  . GERD (gastroesophageal reflux disease) 08/31/2012  . Hypertension 11/26/2011  . Annual physical exam 08/29/2011  . Hives/ angioedema 06/22/2011  . CONSTIPATION, CHRONIC 02/05/2011  . ALLERGIC REACTION, ACUTE 11/27/2010  . HERPES SIMPLEX INFECTION 10/17/2010  . SHOULDER, PAIN 04/07/2010  . LEG PAIN 06/09/2009  . ABDOMINAL PAIN, LEFT LOWER QUADRANT 06/09/2009  . Hyperlipidemia 11/04/2007  . DM II (diabetes mellitus, type II), controlled ([place]) 08/04/2007  . OBESITY 05/16/2007  . Obstructive sleep apnea 05/16/2007  . Diabetic neuropathy ([place]) 05/16/2007  . Seasonal allergic rhinitis 05/16/2007  . Moderate persistent asthmatic bronchitis with acute exacerbation 05/16/2007  . Osteopenia 05/16/2007  . BREAST CANCER, HX OF 05/16/2007  . COLONIC POLYPS, HX OF 05/16/2007    Past Surgical History:  Procedure Laterality Date  . ANAL RECTAL MANOMETRY N/A 04/23/2016   Procedure: ANO RECTAL MANOMETRY;  Surgeon: [name], MD;  [facility]: [facility];  Service: Endoscopy;  Laterality: N/A;  . APPENDECTOMY     08/2004  . cyct on spine      1967  . ELBOW SURGERY Right 2016   DISLOCATION  . EXPLORATORY LAPAROTOMY    . FLEXIBLE SIGMOIDOSCOPY N/A 01/31/2013   Procedure: FLEXIBLE SIGMOIDOSCOPY;  Surgeon: [name], MD;  Location: [facility] ENDOSCOPY;  Service: Endoscopy;  Laterality: N/A;  unprepped with sedation  . IR ANGIOGRAM PELVIS SELECTIVE OR SUPRASELECTIVE  05/22/2017  .  IR ANGIOGRAM SELECTIVE EACH ADDITIONAL VESSEL  05/22/2017  . IR RADIOLOGIST EVAL & MGMT  04/23/2017  . IR RADIOLOGIST EVAL & MGMT  06/26/2017  . IR US GUIDE BX ASP/DRAIN  06/14/2017  . IR US GUIDE VASC ACCESS LEFT  05/22/2017  . lypoma removed from back-benign 2005     2005  . MASTECTOMY Bilateral 2006  . Pilonidal Cyst Surgery     ? of  . removal of boils     1970/1980    OB History   None      Home  Medications    Prior to Admission medications   Medication Sig Start Date End Date Taking? Authorizing Provider  albuterol (VENTOLIN HFA) 108 (90 Base) MCG/ACT inhaler INHALE 2 PUFFS INTO THE LUNGS EVERY 4 HOURS AS NEEDED FOR WHEEZING OR SHORTNESS OF BREATH 06/09/18   Young, Kasandra Knudsen, MD  AMBULATORY NON FORMULARY MEDICATION Medication Name: Nitroglycerin ointment 0.125 % 23 times daily for 4-6 weeks  Use Pea sized amount per rectum 07/01/18   Nandigam, Venia Minks, MD  AMITIZA 24 MCG capsule TAKE 1 CAPSULE (24 MCG TOTAL) BY MOUTH 2 (TWO) TIMES DAILY WITH A MEAL. 08/18/18   Mauri Pole, MD  aspirin EC 81 MG tablet Take 81 mg by mouth daily.    [provider]  budesonide-formoterol (SYMBICORT) 160-4.5 MCG/ACT inhaler Inhale 2 puffs then rinse mouth, twice daily Patient taking differently: as needed. Inhale 2 puffs then rinse mouth, twice daily 06/09/18   Baird Lyons D, MD  calcium-vitamin D (OSCAL WITH D) 250-125 MG-UNIT tablet Take 1 tablet by mouth daily.    [provider]  ciprofloxacin (CIPRO) 500 MG tablet Take 1 tablet (500 mg total) by mouth 2 (two) times daily for 14 days. 08/24/18 09/07/18  Scot Jun, FNP  EPINEPHrine (EPIPEN 2-PAK) 0.3 mg/0.3 mL IJ SOAJ injection Inject 0.3 mLs (0.3 mg total) into the muscle once. Patient not taking: Reported on 08/14/2018 04/11/15   Colon Branch, MD  escitalopram (LEXAPRO) 10 MG tablet Take 1 tablet (10 mg total) by mouth daily. 08/14/18   Colon Branch, MD  fexofenadine (ALLEGRA) 180 MG tablet Take 180 mg by mouth daily.     [provider]  glucose blood (ONE TOUCH ULTRA TEST) test strip Check blood sugars daily. 05/24/17   Colon Branch, MD  lactulose (CHRONULAC) 10 GM/15ML solution Take 15 mLs (10 g total) by mouth 3 (three) times daily as needed. 08/07/18   Colon Branch, MD  letrozole [facility]) 2.5 MG tablet TAKE 1 TABLET BY MOUTH EVERY DAY 08/01/18   Alla Feeling, NP  magnesium hydroxide (MILK OF MAGNESIA) 400 MG/5ML  suspension Take 15 mLs by mouth daily as needed for mild constipation.    [provider]  metFORMIN (GLUCOPHAGE) 1000 MG tablet Take 1 tablet (1,000 mg total) by mouth daily. 12/25/17   Colon Branch, MD  metroNIDAZOLE (FLAGYL) 500 MG tablet Take 500 mg by mouth 2 (two) times daily.    [provider]  Multiple Vitamin (MULTIVITAMIN WITH MINERALS) TABS tablet Take 1 tablet by mouth daily.    [provider]  ONE TOUCH LANCETS MISC Use as directed once daily to check blood sugar.  DX E11.9 04/12/16   Colon Branch, MD  simvastatin (ZOCOR) 40 MG tablet Take 1 tablet (40 mg total) by mouth at bedtime. 05/01/18   Colon Branch, MD  traZODone (DESYREL) 50 MG tablet Take 1 tablet (50 mg total) by  mouth at bedtime. 06/09/18   Deneise Lever, MD  triamterene-hydrochlorothiazide (MAXZIDE-25) 37.5-25 MG tablet Take 1 tablet by mouth daily. 04/02/18   Colon Branch, MD    Family History Family History  Problem Relation Age of Onset  . COPD Mother        smoker  . Hypertension Mother   . Lung cancer Father        died age 57  . Breast cancer Sister   . Diabetes Brother   . Breast cancer Maternal Grandmother   . Schizophrenia Son   . Autism Grandchild        x 2 grandsons  . Colon cancer Neg Hx   . Heart attack Neg Hx        no h/o early diseae    Social History Social History   Tobacco Use  . Smoking status: Former Smoker    Packs/day: 1.00    Years: 4.00    Pack years: 4.00    Types: Cigarettes    Last attempt to quit: 04/01/1968    Years since quitting: 50.4  . Smokeless tobacco: Never Used  . Tobacco comment: Quit at age 47  Substance Use Topics  . Alcohol use: No  . Drug use: No     Allergies   Peanut-containing drug products and Penicillins   Review of Systems Review of Systems Pertinent negatives listed in HPI  Physical Exam Triage Vital Signs ED Triage Vitals  Enc Vitals Group     BP 08/24/18 1434 103/66     Pulse Rate 08/24/18 1434 95     Resp  --      Temp 08/24/18 1434 98.8 F (37.1 C)     Temp Source 08/24/18 1434 Oral     SpO2 08/24/18 1434 98 %     Weight --      Height --      Head Circumference --      Peak Flow --      Pain Score 08/24/18 1435 0     Pain Loc --      Pain Edu? --      Excl. in [place]? --    No data found.  Updated Vital Signs BP 103/66 (BP Location: Left Arm)   Pulse 95   Temp 98.8 F (37.1 C) (Oral)   SpO2 98%   Visual Acuity Right Eye Distance:   Left Eye Distance:   Bilateral Distance:    Right Eye Near:   Left Eye Near:    Bilateral Near:     Physical Exam  Cardiovascular: Normal rate and regular rhythm.  Pulmonary/Chest: Effort normal and breath sounds normal.  Abdominal: Soft. Normal appearance. There is no CVA tenderness.  Neurological: She is alert.  Psychiatric: Her speech is normal. Her affect is blunt. She is slowed. Thought content is delusional. Thought content is not paranoid. Cognition and memory are impaired. She expresses inappropriate judgment. She expresses no homicidal and no suicidal ideation.   UC Treatments / Results  Labs (all labs ordered are listed, but only abnormal results are displayed) Labs Reviewed  POCT URINALYSIS DIP (DEVICE) - Abnormal; Notable for the following components:      Result Value   Bilirubin Urine SMALL (*)    Ketones, ur 15 (*)    Hgb urine dipstick SMALL (*)    Leukocytes, UA SMALL (*)    All other components within normal limits  URINE CULTURE    EKG None  Radiology No results found.  Procedures Procedures (including critical care time)  Medications Ordered in UC Medications - No data to display  Initial Impression / Assessment and Plan / UC Course  I have reviewed the triage vital signs and the nursing notes.  Pertinent labs & imaging results that were available during my care of the patient were reviewed by me and considered in my medical decision making (see chart for details).  Patient presents today accompanied by  her daughter with symptoms associated with the UTI.  Daughter was concerned that 8-day progression of cognitive changes was secondary to UTI symptoms.  However patient has already been evaluated by her primary care provider and there was a recommendation for patient to follow-up with psych as well as start escitalopram which she has done neither.  For cognitive changes I recommend immediate follow-up with primary care provider Dr. Larose Kells on tomorrow.  Daughter advised that she will contact PCP.  Will treat empirically with ciprofloxacin 500 mg twice daily x14 days for urinary tract infection.  Urine culture pending.  Patient and daughter agreed with plan.   Final Clinical Impressions(s) / UC Diagnoses   Final diagnoses:  Acute cystitis with hematuria     Discharge Instructions     Call primary provider tomorrow regarding concern for patient change in cognition. Complete all antibiotic and hydrate well to resolve UTI.    ED Prescriptions    Medication Sig Dispense Auth. Provider   ciprofloxacin (CIPRO) 500 MG tablet Take 1 tablet (500 mg total) by mouth 2 (two) times daily for 14 days. 28 tablet Scot Jun, FNP     Controlled Substance Prescriptions [place] Controlled Substance Registry consulted? Not Applicable   Scot Jun, [place] 08/24/18 [phone]

## 2018-08-24 NOTE — ED Triage Notes (Signed)
Pt here today for confusion and possible uti.  Pt's family states that they did a test at home and it was positive for a UTI so they would like to get it treated and see if it will help the patients confusion.  Pt has flight of thought, and has some paranoid ideation.  She denies wanting to harm anyone, but is concerned about hurting other people's reputations with her actions.  It was explained to the patients family that we would be able to diagnose and treat the UTI but that we would not be able to treat the possible mental health issues.  They wanted to stay here and focus on the possible UTI for now.

## 2018-08-24 NOTE — Discharge Instructions (Signed)
Call primary provider tomorrow regarding concern for patient change in cognition. Complete all antibiotic and hydrate well to resolve UTI.

## 2018-08-24 NOTE — ED Notes (Signed)
Bed: WA20 Expected date:  Expected time:  Means of arrival:  Comments: 77 yo F/paranoid/anxious

## 2018-08-25 ENCOUNTER — Emergency Department (HOSPITAL_COMMUNITY): Payer: Medicare Other

## 2018-08-25 ENCOUNTER — Encounter (HOSPITAL_COMMUNITY): Payer: Self-pay | Admitting: Radiology

## 2018-08-25 LAB — CBC WITH DIFFERENTIAL/PLATELET
BASOS ABS: 0 10*3/uL (ref 0.0–0.1)
Basophils Relative: 1 %
EOS ABS: 0.1 10*3/uL (ref 0.0–0.7)
EOS PCT: 1 %
HCT: 40.7 % (ref 36.0–46.0)
HEMOGLOBIN: 14.4 g/dL (ref 12.0–15.0)
LYMPHS PCT: 18 %
Lymphs Abs: 1.4 10*3/uL (ref 0.7–4.0)
MCH: 30.9 pg (ref 26.0–34.0)
MCHC: 35.4 g/dL (ref 30.0–36.0)
MCV: 87.3 fL (ref 78.0–100.0)
Monocytes Absolute: 1 10*3/uL (ref 0.1–1.0)
Monocytes Relative: 13 %
NEUTROS PCT: 67 %
Neutro Abs: 5.1 10*3/uL (ref 1.7–7.7)
PLATELETS: 343 10*3/uL (ref 150–400)
RBC: 4.66 MIL/uL (ref 3.87–5.11)
RDW: 12.9 % (ref 11.5–15.5)
WBC: 7.6 10*3/uL (ref 4.0–10.5)

## 2018-08-25 LAB — COMPREHENSIVE METABOLIC PANEL
ALBUMIN: 4.1 g/dL (ref 3.5–5.0)
ALK PHOS: 39 U/L (ref 38–126)
ALT: 13 U/L (ref 0–44)
AST: 32 U/L (ref 15–41)
Anion gap: 12 (ref 5–15)
BUN: 24 mg/dL — AB (ref 8–23)
CHLORIDE: 87 mmol/L — AB (ref 98–111)
CO2: 28 mmol/L (ref 22–32)
CREATININE: 1.1 mg/dL — AB (ref 0.44–1.00)
Calcium: 9.7 mg/dL (ref 8.9–10.3)
GFR calc Af Amer: 55 mL/min — ABNORMAL LOW (ref >60)
GFR calc non Af Amer: 48 mL/min — ABNORMAL LOW (ref >60)
GLUCOSE: 103 mg/dL — AB (ref 70–99)
Potassium: 3.1 mmol/L — ABNORMAL LOW (ref 3.5–5.1)
SODIUM: 127 mmol/L — AB (ref 135–145)
Total Bilirubin: 1.5 mg/dL — ABNORMAL HIGH (ref 0.3–1.2)
Total Protein: 7 g/dL (ref 6.5–8.1)

## 2018-08-25 LAB — URINALYSIS, ROUTINE W REFLEX MICROSCOPIC
BACTERIA UA: NONE SEEN
Bilirubin Urine: NEGATIVE
Glucose, UA: NEGATIVE mg/dL
Ketones, ur: NEGATIVE mg/dL
NITRITE: NEGATIVE
Protein, ur: NEGATIVE mg/dL
SPECIFIC GRAVITY, URINE: 1.008 (ref 1.005–1.030)
pH: 5 (ref 5.0–8.0)

## 2018-08-25 LAB — LACTIC ACID, PLASMA: LACTIC ACID, VENOUS: 1.2 mmol/L (ref 0.5–1.9)

## 2018-08-25 LAB — URINE CULTURE

## 2018-08-25 LAB — MAGNESIUM: Magnesium: 1.8 mg/dL (ref 1.7–2.4)

## 2018-08-25 LAB — RAPID URINE DRUG SCREEN, HOSP PERFORMED
Amphetamines: NOT DETECTED
BARBITURATES: NOT DETECTED
BENZODIAZEPINES: NOT DETECTED
Cocaine: NOT DETECTED
Opiates: NOT DETECTED
TETRAHYDROCANNABINOL: NOT DETECTED

## 2018-08-25 LAB — AMMONIA: AMMONIA: 12 umol/L (ref 9–35)

## 2018-08-25 LAB — ETHANOL: Alcohol, Ethyl (B): <10 mg/dL (ref <10)

## 2018-08-25 MED ORDER — POTASSIUM CHLORIDE CRYS ER 20 MEQ PO TBCR
40.0000 meq | EXTENDED_RELEASE_TABLET | Freq: Once | ORAL | Status: AC
  Administered 2018-08-25: 40 meq via ORAL
  Filled 2018-08-25: qty 2

## 2018-08-25 MED ORDER — ESCITALOPRAM OXALATE 10 MG PO TABS
10.0000 mg | ORAL_TABLET | Freq: Every day | ORAL | Status: DC
  Administered 2018-08-25 – 2018-08-27 (×3): 10 mg via ORAL
  Filled 2018-08-25 (×3): qty 1

## 2018-08-25 MED ORDER — CIPROFLOXACIN HCL 500 MG PO TABS
500.0000 mg | ORAL_TABLET | Freq: Two times a day (BID) | ORAL | Status: DC
  Administered 2018-08-25 – 2018-08-27 (×5): 500 mg via ORAL
  Filled 2018-08-25 (×5): qty 1

## 2018-08-25 MED ORDER — ACETAMINOPHEN 325 MG PO TABS
650.0000 mg | ORAL_TABLET | ORAL | Status: DC | PRN
  Filled 2018-08-25: qty 2

## 2018-08-25 MED ORDER — SIMVASTATIN 40 MG PO TABS
40.0000 mg | ORAL_TABLET | Freq: Every day | ORAL | Status: DC
  Administered 2018-08-25 – 2018-08-26 (×2): 40 mg via ORAL
  Filled 2018-08-25 (×2): qty 1

## 2018-08-25 MED ORDER — ONDANSETRON HCL 4 MG PO TABS: 4.0000 mg | ORAL_TABLET | Freq: Three times a day (TID) | ORAL | Status: DC | PRN

## 2018-08-25 MED ORDER — METFORMIN HCL 500 MG PO TABS
1000.0000 mg | ORAL_TABLET | Freq: Every day | ORAL | Status: DC
  Administered 2018-08-25 – 2018-08-27 (×3): 1000 mg via ORAL
  Filled 2018-08-25 (×3): qty 2

## 2018-08-25 MED ORDER — TRIAMTERENE-HCTZ 37.5-25 MG PO TABS
1.0000 | ORAL_TABLET | Freq: Every day | ORAL | Status: DC
  Administered 2018-08-25 – 2018-08-27 (×3): 1 via ORAL
  Filled 2018-08-25 (×3): qty 1

## 2018-08-25 MED ORDER — LETROZOLE 2.5 MG PO TABS
2.5000 mg | ORAL_TABLET | Freq: Every day | ORAL | Status: DC
  Administered 2018-08-25 – 2018-08-27 (×3): 2.5 mg via ORAL
  Filled 2018-08-25 (×3): qty 1

## 2018-08-25 MED ORDER — SODIUM CHLORIDE 0.9 % IV BOLUS
1000.0000 mL | Freq: Once | INTRAVENOUS | Status: AC
  Administered 2018-08-25: 1000 mL via INTRAVENOUS

## 2018-08-25 MED ORDER — ALBUTEROL SULFATE HFA 108 (90 BASE) MCG/ACT IN AERS: 2.0000 | INHALATION_SPRAY | RESPIRATORY_TRACT | Status: DC | PRN

## 2018-08-25 MED ORDER — MOMETASONE FURO-FORMOTEROL FUM 200-5 MCG/ACT IN AERO
2.0000 | INHALATION_SPRAY | Freq: Two times a day (BID) | RESPIRATORY_TRACT | Status: DC
  Administered 2018-08-26 – 2018-08-27 (×3): 2 via RESPIRATORY_TRACT
  Filled 2018-08-25 (×2): qty 8.8

## 2018-08-25 MED ORDER — TRAZODONE HCL 50 MG PO TABS
50.0000 mg | ORAL_TABLET | Freq: Every day | ORAL | Status: DC
  Administered 2018-08-25 – 2018-08-26 (×2): 50 mg via ORAL
  Filled 2018-08-25 (×2): qty 1

## 2018-08-25 MED ORDER — POTASSIUM CHLORIDE 10 MEQ/100ML IV SOLN
10.0000 meq | INTRAVENOUS | Status: AC
  Administered 2018-08-25 (×2): 10 meq via INTRAVENOUS
  Filled 2018-08-25 (×2): qty 100

## 2018-08-25 NOTE — ED Notes (Signed)
EKG given to EDP,Pollina,MD., for review. 

## 2018-08-25 NOTE — ED Notes (Signed)
Dr. Francia Greaves aware of pt's elevated BP.  Pt states she hasn't had her BP medications today.

## 2018-08-25 NOTE — Progress Notes (Signed)
Pt meets inpatient criteria per Reita Cliche DNP. Referral information has been faxed to the following hospitals for review: [facility]  [facility]   CSW will continue to assist with placement needs.   Audree Camel, LCSW, [place] [facility] [place] [facility]/TTS [phone]

## 2018-08-25 NOTE — ED Notes (Signed)
Patient transported to CT 

## 2018-08-25 NOTE — ED Provider Notes (Addendum)
[facility] DEPT Provider Note   CSN: [id number] Arrival date & time: 08/24/18  2351     History   Chief Complaint Chief Complaint  Patient presents with  . Medical Clearance  . Paranoid    HPI [name] is a 77 y.o. female.  Patient evaluated for paranoia and mental status changes.  This has apparently been ongoing for more than a week.  She was seen by her primary care doctor for this, referred to psychiatry and started on Lexapro.  She has not actually started the medication however.  She was seen at urgent care with concerns over possible urinary tract infection causing her mental status changes.  They started her on Cipro, but reviewing the ER and it did not appear to be particularly infected.  Culture is still pending.  Patient now in the ER because she thinks that she has been having paranoid thoughts and is concerned about this.  She appears somewhat confused and cannot answer questions appropriately.     Past Medical History:  Diagnosis Date  . Allergic rhinitis   . Anal fissure   . Asthma    dx in adulthood (77 y/o) PFT 09/08/09 FEV1 1.70/81%; FEV1/FVC 0.75; +resp to dilartor; NI LV/DLCO  . Breast CA ([place])    hx of bilateral diagnosied in 2006, recurred 2011- [name]  . Chronic constipation   . Colonic polyp   . COPD (chronic obstructive pulmonary disease) ([place])   . Diabetes mellitus   . Dislocation of right elbow 2016  . HTN (hypertension) 11-2011  . Hyperlipidemia   . OSA (obstructive sleep apnea)    CPAP intolerant  . Osteopenia   . Peripheral neuropathy   . 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 Urinary retention    During massive constipation (pelvic exam, suspect 8cm or more in diameter)    Patient Active Problem List   Diagnosis Date Noted  . Gait disorder 06/20/2018  . 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 Aortic atherosclerosis ([place]) 01/06/2017  . Ileus ([place])  10/02/2015  . PCP NOTES >>>>>>>>>>>>>>>>>>>>>>>>>>>>>> 09/03/2015  . Recurrent cancer of left breast ([place]) 03/16/2015  . Diplopia 10/07/2014  . Insomnia 08/31/2012  . GERD (gastroesophageal reflux disease) 08/31/2012  . Hypertension 11/26/2011  . Annual physical exam 08/29/2011  . Hives/ angioedema 06/22/2011  . CONSTIPATION, CHRONIC 02/05/2011  . ALLERGIC REACTION, ACUTE 11/27/2010  . HERPES SIMPLEX INFECTION 10/17/2010  . SHOULDER, PAIN 04/07/2010  . LEG PAIN 06/09/2009  . ABDOMINAL PAIN, LEFT LOWER QUADRANT 06/09/2009  . Hyperlipidemia 11/04/2007  . DM II (diabetes mellitus, type II), controlled ([place]) 08/04/2007  . OBESITY 05/16/2007  . Obstructive sleep apnea 05/16/2007  . Diabetic neuropathy ([place]) 05/16/2007  . Seasonal allergic rhinitis 05/16/2007  . Moderate persistent asthmatic bronchitis with acute exacerbation 05/16/2007  . Osteopenia 05/16/2007  . BREAST CANCER, HX OF 05/16/2007  . COLONIC POLYPS, HX OF 05/16/2007    Past Surgical History:  Procedure Laterality Date  . ANAL RECTAL MANOMETRY N/A 04/23/2016   Procedure: ANO RECTAL MANOMETRY;  Surgeon: [name], MD;  Location: [facility] ENDOSCOPY;  Service: Endoscopy;  Laterality: N/A;  . APPENDECTOMY     08/2004  . cyct on spine      1967  . ELBOW SURGERY Right 2016   DISLOCATION  . EXPLORATORY LAPAROTOMY    . FLEXIBLE SIGMOIDOSCOPY N/A 01/31/2013   Procedure: FLEXIBLE SIGMOIDOSCOPY;  Surgeon: [name], MD;  Location: [facility] ENDOSCOPY;  Service: Endoscopy;  Laterality: N/A;  unprepped with sedation  . IR ANGIOGRAM PELVIS SELECTIVE OR SUPRASELECTIVE  05/22/2017  . IR ANGIOGRAM SELECTIVE EACH ADDITIONAL VESSEL  05/22/2017  . IR RADIOLOGIST EVAL & MGMT  04/23/2017  . IR RADIOLOGIST EVAL & MGMT  06/26/2017  . IR US GUIDE BX ASP/DRAIN  06/14/2017  . IR US GUIDE VASC ACCESS LEFT  05/22/2017  . lypoma removed from back-benign 2005     2005  . MASTECTOMY Bilateral 2006  . Pilonidal Cyst Surgery     ? of  . removal of  boils     1970/1980     OB History   None      Home Medications    Prior to Admission medications   Medication Sig Start Date End Date Taking? Authorizing Provider  albuterol (VENTOLIN HFA) 108 (90 Base) MCG/ACT inhaler INHALE 2 PUFFS INTO THE LUNGS EVERY 4 HOURS AS NEEDED FOR WHEEZING OR SHORTNESS OF BREATH 06/09/18   Young, Kasandra Knudsen, MD  AMBULATORY NON FORMULARY MEDICATION Medication Name: Nitroglycerin ointment 0.125 % 23 times daily for 4-6 weeks  Use Pea sized amount per rectum 07/01/18   Nandigam, Venia Minks, MD  AMITIZA 24 MCG capsule TAKE 1 CAPSULE (24 MCG TOTAL) BY MOUTH 2 (TWO) TIMES DAILY WITH A MEAL. 08/18/18   Mauri Pole, MD  aspirin EC 81 MG tablet Take 81 mg by mouth daily.    [provider]  budesonide-formoterol (SYMBICORT) 160-4.5 MCG/ACT inhaler Inhale 2 puffs then rinse mouth, twice daily Patient taking differently: as needed. Inhale 2 puffs then rinse mouth, twice daily 06/09/18   Baird Lyons D, MD  calcium-vitamin D (OSCAL WITH D) 250-125 MG-UNIT tablet Take 1 tablet by mouth daily.    [provider]  ciprofloxacin (CIPRO) 500 MG tablet Take 1 tablet (500 mg total) by mouth 2 (two) times daily for 14 days. 08/24/18 09/07/18  Scot Jun, FNP  EPINEPHrine (EPIPEN 2-PAK) 0.3 mg/0.3 mL IJ SOAJ injection Inject 0.3 mLs (0.3 mg total) into the muscle once. Patient not taking: Reported on 08/14/2018 04/11/15   Colon Branch, MD  escitalopram (LEXAPRO) 10 MG tablet Take 1 tablet (10 mg total) by mouth daily. 08/14/18   Colon Branch, MD  fexofenadine (ALLEGRA) 180 MG tablet Take 180 mg by mouth daily.     [provider]  glucose blood (ONE TOUCH ULTRA TEST) test strip Check blood sugars daily. 05/24/17   Colon Branch, MD  lactulose (CHRONULAC) 10 GM/15ML solution Take 15 mLs (10 g total) by mouth 3 (three) times daily as needed. 08/07/18   Colon Branch, MD  letrozole [facility]) 2.5 MG tablet TAKE 1 TABLET BY MOUTH EVERY DAY 08/01/18   Alla Feeling,  NP  magnesium hydroxide (MILK OF MAGNESIA) 400 MG/5ML suspension Take 15 mLs by mouth daily as needed for mild constipation.    [provider]  metFORMIN (GLUCOPHAGE) 1000 MG tablet Take 1 tablet (1,000 mg total) by mouth daily. 12/25/17   Colon Branch, MD  metroNIDAZOLE (FLAGYL) 500 MG tablet Take 500 mg by mouth 2 (two) times daily.    [provider]  Multiple Vitamin (MULTIVITAMIN WITH MINERALS) TABS tablet Take 1 tablet by mouth daily.    [provider]  ONE TOUCH LANCETS MISC Use as directed once daily to check blood sugar.  DX E11.9 04/12/16   Colon Branch, MD  simvastatin (ZOCOR) 40 MG tablet Take 1 tablet (40 mg total) by mouth at bedtime. 05/01/18   Paz,  Alda Berthold, MD  traZODone (DESYREL) 50 MG tablet Take 1 tablet (50 mg total) by mouth at bedtime. 06/09/18   Deneise Lever, MD  triamterene-hydrochlorothiazide (MAXZIDE-25) 37.5-25 MG tablet Take 1 tablet by mouth daily. 04/02/18   Colon Branch, MD    Family History Family History  Problem Relation Age of Onset  . COPD Mother        smoker  . Hypertension Mother   . Lung cancer Father        died age 30  . Breast cancer Sister   . Diabetes Brother   . Breast cancer Maternal Grandmother   . Schizophrenia Son   . Autism Grandchild        x 2 grandsons  . Colon cancer Neg Hx   . Heart attack Neg Hx        no h/o early diseae    Social History Social History   Tobacco Use  . Smoking status: Former Smoker    Packs/day: 1.00    Years: 4.00    Pack years: 4.00    Types: Cigarettes    Last attempt to quit: 04/01/1968    Years since quitting: 50.4  . Smokeless tobacco: Never Used  . Tobacco comment: Quit at age 8  Substance Use Topics  . Alcohol use: No  . Drug use: No     Allergies   Peanut-containing drug products and Penicillins   Review of Systems Review of Systems  Psychiatric/Behavioral: Positive for confusion, decreased concentration and dysphoric mood.  All other systems reviewed and  are negative.    Physical Exam Updated Vital Signs BP 122/81   Pulse 62   Temp 97.8 F (36.6 C) (Oral)   Resp (!) 22   SpO2 100%   Physical Exam  Constitutional: She is oriented to person, place, and time. She appears well-developed and well-nourished. No distress.  HENT:  Head: Normocephalic and atraumatic.  Right Ear: Hearing normal.  Left Ear: Hearing normal.  Nose: Nose normal.  Mouth/Throat: Oropharynx is clear and moist and mucous membranes are normal.  Eyes: Pupils are equal, round, and reactive to light. Conjunctivae and EOM are normal.  Neck: Normal range of motion. Neck supple.  Cardiovascular: Regular rhythm, S1 normal and S2 normal. Exam reveals no gallop and no friction rub.  No murmur heard. Pulmonary/Chest: Effort normal and breath sounds normal. No respiratory distress. She exhibits no tenderness.  Abdominal: Soft. Normal appearance and bowel sounds are normal. There is no hepatosplenomegaly. There is no tenderness. There is no rebound, no guarding, no tenderness at McBurney's point and negative Murphy's sign. No hernia.  Musculoskeletal: Normal range of motion.  Neurological: She is alert and oriented to person, place, and time. She has normal strength. No cranial nerve deficit or sensory deficit. Coordination normal. GCS eye subscore is 4. GCS verbal subscore is 5. GCS motor subscore is 6.  Skin: Skin is warm, dry and intact. No rash noted. No cyanosis.  Psychiatric: She has a normal mood and affect. Her speech is delayed and tangential. She is slowed. Thought content is paranoid and delusional.  Nursing note and vitals reviewed.    ED Treatments / Results  Labs (all labs ordered are listed, but only abnormal results are displayed) Labs Reviewed  COMPREHENSIVE METABOLIC PANEL - Abnormal; Notable for the following components:      Result Value   Sodium 127 (*)    Potassium 3.1 (*)    Chloride 87 (*)    Glucose, Bld  103 (*)    BUN 24 (*)    Creatinine,  Ser 1.10 (*)    Total Bilirubin 1.5 (*)    GFR calc non Af Amer 48 (*)    GFR calc Af Amer 55 (*)    All other components within normal limits  URINALYSIS, ROUTINE W REFLEX MICROSCOPIC - Abnormal; Notable for the following components:   Hgb urine dipstick MODERATE (*)    Leukocytes, UA SMALL (*)    All other components within normal limits  CBC WITH DIFFERENTIAL/PLATELET  AMMONIA  ETHANOL  RAPID URINE DRUG SCREEN, HOSP PERFORMED  LACTIC ACID, PLASMA  LACTIC ACID, PLASMA  MAGNESIUM    EKG EKG Interpretation  Date/Time:  Monday August 25 2018 00:56:36 EDT Ventricular Rate:  68 PR Interval:    QRS Duration: 112 QT Interval:  464 QTC Calculation: 494 R Axis:   -47 Text Interpretation:  Sinus rhythm Left anterior fascicular block Left ventricular hypertrophy Anterior infarct, old No significant change since last tracing Confirmed by Orpah Greek [phone]) on 08/25/2018 1:00:44 AM   Radiology Ct Head Wo Contrast  Result Date: 08/25/2018 CLINICAL DATA:  Altered level of consciousness, confusion, weakness, difficulty walking worse today. History of bilateral breast cancer post chemotherapy and x-ray therapy. EXAM: CT HEAD WITHOUT CONTRAST TECHNIQUE: Contiguous axial images were obtained from the base of the skull through the vertex without intravenous contrast. COMPARISON:  MRI brain 03/22/2018. CT head 12/30/2017 FINDINGS: Brain: No evidence of acute hemorrhage, hydrocephalus, extra-axial collection or mass lesion/mass effect. Mild diffuse cerebral atrophy. Ventricular dilatation consistent with central atrophy. Low-attenuation changes in the deep white matter consistent with small vessel ischemia. Vague asymmetric low-attenuation in the right anterior periventricular white matter appears more prominent than previous study. This could represent developing subacute infarct. No significant mass effect or sulcal effacement. Vascular: Moderate intracranial arterial calcifications are  present. Skull: Calvarium appears intact. Sinuses/Orbits: Paranasal sinuses and mastoid air cells are clear. Other: None. IMPRESSION: Chronic atrophy and small vessel ischemic changes. Vague asymmetric low-attenuation in the right anterior periventricular white matter may represent developing or subacute infarct. No acute intracranial hemorrhage or mass effect. Electronically Signed   By: Lucienne Capers M.D.   On: 08/25/2018 00:56    Procedures Procedures (including critical care time)  Medications Ordered in ED Medications  sodium chloride 0.9 % bolus 1,000 mL (1,000 mLs Intravenous New Bag/Given 08/25/18 0422)  potassium chloride 10 mEq in 100 mL IVPB (10 mEq Intravenous New Bag/Given 08/25/18 0421)  potassium chloride SA (K-DUR,KLOR-CON) CR tablet 40 mEq (40 mEq Oral Given 08/25/18 0422)     Initial Impression / Assessment and Plan / ED Course  I have reviewed the triage vital signs and the nursing notes.  Pertinent labs & imaging results that were available during my care of the patient were reviewed by me and considered in my medical decision making (see chart for details).     Patient presents to the emergency department for evaluation of mental status changes that have occurred over the last week or more.  She has seen her primary care doctor for this as well as urgent care.  She was initiated on treatment for urinary tract infection.  Urinalysis here in the ER does not suggest infection, blood work was unremarkable other than mild hypokalemia and hyponatremia.  She is being administered IV saline, potassium here in the ER for repletion.  Patient had a CT of her head which was inconclusive.  I do not believe that this would explain  her mental status changes if this was a stroke, but will obtain MRI in the morning.  If MRI is negative, patient will require psychiatric evaluation.  Addendum: 7:05 AM MRI has been performed.  No stroke, no acute intracranial pathology noted.  Will pursue  psychiatric evaluation.  Final Clinical Impressions(s) / ED Diagnoses   Final diagnoses:  Altered mental status, unspecified altered mental status type    ED Discharge Orders    None       Pollina, Gwenyth Allegra, MD 08/25/18 [id number]    Orpah Greek, MD 08/25/18 [phone]

## 2018-08-25 NOTE — BH Assessment (Signed)
[facility] Assessment Progress Note  Case was staffed with Reita Cliche DNP who recommended a inpatient admission (Geropsychiatry) to assist with stabilization.

## 2018-08-25 NOTE — Progress Notes (Addendum)
The home med list is updated as well as possible. The patient lives alone and manages her own medications. She is confused and unable to verify what she has been taking exactly. The daughter is present but is unable to verify what medications the patient has been taking. We have added last-filled dates to records where possible. Please notify pharmacy if we can be of further assistance.   Romeo Rabon, PharmD. Mobile: [phone]. 08/25/2018,9:18 AM.

## 2018-08-25 NOTE — ED Notes (Addendum)
Patient unable to communicate thoughts effectively. Patient jumps from one subject to the next with no clear connection.

## 2018-08-25 NOTE — BH Assessment (Addendum)
Assessment Note  [name] is an 77 y.o. female that presents this date voluntary with passive S/I and altered mental status. Patient denies any H/I or AVH. Patient's family friend is present (with patient's permission) Imagene Gurney [phone] who renders collateral information. Patient is observed to be somewhat disorganized although is oriented to place, situation although struggled with date/day. Patient also displays some active thought blocking and is circumstantial. Patient cannot recall the question/s once asked and does eventually recall the question with prompting. Patient reports she was diagnosed mild cognitive impairment and depression by her PCP Larose Kells MD) who prescribed Lexapro 10 mg last week although patient states she started that medication yesterday 08/24/18 and became very paranoid after reading the side effects. Patient states she has not slept in "a few days" and is vague in reference to hours slept. Patient reports her anxiety has increased after her son who was residing with her was diagnosed with a mental health disorder himself and is currently hospitalized. Patient denies any previous attempts/gestures at self harm H/I or AVH. Patient reports ongoing thoughts of death and is in the process of acquiring a POA. Patient states she is so "overwhelmed with all these descions" that she is "unsure about her life." Patient's friend who is present states she has recently decompensated within the last week reporting she has been "really confused" since her son was diagnosed with a MH disorder. Patient reports her "balance is really off" the last few days and has started to use a cane to ambulate. Patient reports she continues to do her ADL's although she is afraid of falling. Patient denies any SA history and does not seem to be responding to any internal stimuli. Per notes, "Patient evaluated on admission for paranoia and mental status changes. This has apparently been ongoing for more  than a week. She was seen by her primary care doctor for this and started on Lexapro. She was also seen at urgent care with concerns over possible urinary tract infection causing her mental status changes. They started her on Cipro, but reviewing the ER and it did not appear to be particularly infected. Patient now in the ER because she thinks that she has been having paranoid thoughts and is concerned about this. She appears somewhat confused and cannot answer questions appropriately". Case was staffed with Reita Cliche DNP who recommended a inpatient admission (Geropsychiatry) to assist with stabilization.    Diagnosis: F33.2 MDD recurrent without psychotic features, severe   Past Medical History:  Past Medical History:  Diagnosis Date  . Allergic rhinitis   . Anal fissure   . Asthma    dx in adulthood (77 y/o) PFT 09/08/09 FEV1 1.70/81%; FEV1/FVC 0.75; +resp to dilartor; NI LV/DLCO  . Breast CA ([place])    hx of bilateral diagnosied in 2006, recurred 2011- [name]  . Chronic constipation   . Colonic polyp   . COPD (chronic obstructive pulmonary disease) ([place])   . Diabetes mellitus   . Dislocation of right elbow 2016  . HTN (hypertension) 11-2011  . Hyperlipidemia   . OSA (obstructive sleep apnea)    CPAP intolerant  . Osteopenia   . Peripheral neuropathy   . 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 Urinary retention    During massive constipation (pelvic exam, suspect 8cm or more in diameter)    Past Surgical History:  Procedure Laterality Date  . ANAL RECTAL MANOMETRY N/A 04/23/2016   Procedure: ANO RECTAL MANOMETRY;  Surgeon: [name], MD;  Location:  [facility] ENDOSCOPY;  Service: Endoscopy;  Laterality: N/A;  . APPENDECTOMY     08/2004  . cyct on spine      1967  . ELBOW SURGERY Right 2016   DISLOCATION  . EXPLORATORY LAPAROTOMY    . FLEXIBLE SIGMOIDOSCOPY N/A 01/31/2013   Procedure: FLEXIBLE SIGMOIDOSCOPY;  Surgeon: [name], MD;  Location: [facility]  ENDOSCOPY;  Service: Endoscopy;  Laterality: N/A;  unprepped with sedation  . IR ANGIOGRAM PELVIS SELECTIVE OR SUPRASELECTIVE  05/22/2017  . IR ANGIOGRAM SELECTIVE EACH ADDITIONAL VESSEL  05/22/2017  . IR RADIOLOGIST EVAL & MGMT  04/23/2017  . IR RADIOLOGIST EVAL & MGMT  06/26/2017  . IR US GUIDE BX ASP/DRAIN  06/14/2017  . IR US GUIDE VASC ACCESS LEFT  05/22/2017  . lypoma removed from back-benign 2005     2005  . MASTECTOMY Bilateral 2006  . Pilonidal Cyst Surgery     ? of  . removal of boils     1970/1980    Family History:  Family History  Problem Relation Age of Onset  . COPD Mother        smoker  . Hypertension Mother   . Lung cancer Father        died age 36  . Breast cancer Sister   . Diabetes Brother   . Breast cancer Maternal Grandmother   . Schizophrenia Son   . Autism Grandchild        x 2 grandsons  . Colon cancer Neg Hx   . Heart attack Neg Hx        no h/o early diseae    Social History:  reports that she quit smoking about 50 years ago. Her smoking use included cigarettes. She has a 4.00 pack-year smoking history. She has never used smokeless tobacco. She reports that she does not drink alcohol or use drugs.  Additional Social History:  Alcohol / Drug Use Pain Medications: See MAR Prescriptions: See MAR Over the Counter: See MAR History of alcohol / drug use?: No history of alcohol / drug abuse Longest period of sobriety (when/how long): NA Negative Consequences of Use: (NA) Withdrawal Symptoms: (NA)  CIWA: CIWA-Ar BP: (!) 161/88 Pulse Rate: 70 COWS:    Allergies:  Allergies  Allergen Reactions  . Peanut-Containing Drug Products Swelling  . Penicillins Itching    Has patient had a PCN reaction causing immediate rash, facial/tongue/throat swelling, SOB or lightheadedness with hypotension: No Has patient had a PCN reaction causing severe rash involving mucus membranes or skin necrosis: No Has patient had a PCN reaction that required hospitalization:  No Has patient had a PCN reaction occurring within the last 10 years: No If all of the above answers are "NO", then may proceed with Cephalosporin use.  "outer rectal itching"    Home Medications:  (Not in a hospital admission)  OB/GYN Status:  No LMP recorded. Patient is postmenopausal.  General Assessment Data Location of Assessment: [facility] ED TTS Assessment: In system Is this a Tele or Face-to-Face Assessment?: Face-to-Face Is this an Initial Assessment or a Re-assessment for this encounter?: Initial Assessment Patient Accompanied by:: (NA) Language Other than English: No Living Arrangements: (Alone) What gender do you identify as?: Female Marital status: Single Maiden name: [place] Pregnancy Status: No Living Arrangements: Alone Can pt return to current living arrangement?: Yes Admission Status: Voluntary Is patient capable of signing voluntary admission?: Yes Referral Source: Self/Family/Friend Insurance type: Medicare  Medical Screening Exam ([place]) Medical Exam completed: Yes  Crisis  Care Plan Living Arrangements: Alone Legal Guardian: (NA) Name of Psychiatrist: None Name of Therapist: None  Education Status Is patient currently in school?: No Is the patient employed, unemployed or receiving disability?: Unemployed  Risk to self with the past 6 months Suicidal Ideation: Yes-Currently Present Has patient been a risk to self within the past 6 months prior to admission? : No Suicidal Intent: No Has patient had any suicidal intent within the past 6 months prior to admission? : No Is patient at risk for suicide?: No Suicidal Plan?: No Has patient had any suicidal plan within the past 6 months prior to admission? : No Access to Means: No What has been your use of drugs/alcohol within the last 12 months?: Denies Previous Attempts/Gestures: No How many times?: 0 Other Self Harm Risks: (Fall risk altered mental state) Triggers for Past Attempts:  Unknown Intentional Self Injurious Behavior: None Family Suicide History: No Recent stressful life event(s): (Living alone increased depression paranoia) Persecutory voices/beliefs?: No Depression: Yes Depression Symptoms: Despondent, Isolating Substance abuse history and/or treatment for substance abuse?: No Suicide prevention information given to non-admitted patients: Not applicable  Risk to Others within the past 6 months Homicidal Ideation: No Does patient have any lifetime risk of violence toward others beyond the six months prior to admission? : No Thoughts of Harm to Others: No Current Homicidal Intent: No Current Homicidal Plan: No Access to Homicidal Means: No Identified Victim: NA History of harm to others?: No Assessment of Violence: None Noted Violent Behavior Description: NA Does patient have access to weapons?: No Criminal Charges Pending?: No Does patient have a court date: No Is patient on probation?: No  Psychosis Hallucinations: None noted Delusions: None noted  Mental Status Report Appearance/Hygiene: Unremarkable Eye Contact: Fair Motor Activity: Freedom of movement Speech: Soft, Slow Level of Consciousness: Quiet/awake Mood: Anxious Affect: Appropriate to circumstance Anxiety Level: Moderate Thought Processes: Thought Blocking Judgement: Partial Orientation: Person, Place Obsessive Compulsive Thoughts/Behaviors: None  Cognitive Functioning Concentration: Poor Memory: Remote Intact Is patient IDD: No Insight: Fair Impulse Control: Fair Appetite: Fair Have you had any weight changes? : No Change Sleep: Decreased Total Hours of Sleep: (Pt is vague in reference to how many hours) Vegetative Symptoms: None  ADLScreening [facility] Assessment Services) Patient's cognitive ability adequate to safely complete daily activities?: Yes Patient able to express need for assistance with ADLs?: Yes Independently performs ADLs?: Yes (appropriate for  developmental age)  Prior Inpatient Therapy Prior Inpatient Therapy: No  Prior Outpatient Therapy Prior Outpatient Therapy: No Does patient have an ACCT team?: No Does patient have Intensive In-House Services?  : No Does patient have [facility] services? : No Does patient have P4CC services?: No  ADL Screening (condition at time of admission) Patient's cognitive ability adequate to safely complete daily activities?: Yes Is the patient deaf or have difficulty hearing?: No Does the patient have difficulty seeing, even when wearing glasses/contacts?: No Does the patient have difficulty concentrating, remembering, or making decisions?: Yes Patient able to express need for assistance with ADLs?: Yes Does the patient have difficulty dressing or bathing?: Yes(Fall risk) Independently performs ADLs?: Yes (appropriate for developmental age) Does the patient have difficulty walking or climbing stairs?: Yes Weakness of Legs: None Weakness of Arms/Hands: None  Home Assistive Devices/Equipment Home Assistive Devices/Equipment: Cane (specify quad or straight)(Quad)  Therapy Consults (therapy consults require a physician order) PT Evaluation Needed: No OT Evalulation Needed: No SLP Evaluation Needed: No Abuse/Neglect Assessment (Assessment to be complete while patient is alone) Physical Abuse:  Denies Verbal Abuse: Denies Sexual Abuse: Denies Exploitation of patient/patient's resources: Denies Self-Neglect: Denies Values / Beliefs Cultural Requests During Hospitalization: None Spiritual Requests During Hospitalization: None Consults Spiritual Care Consult Needed: No Social Work Consult Needed: No Regulatory affairs officer (For Healthcare) Does Patient Have a Medical Advance Directive?: No Does patient want to make changes to medical advance directive?: No - Patient declined(Declined this date ) Type of Advance Directive: Living will Copy of Living Will in Chart?: Yes Would patient like  information on creating a medical advance directive?: Yes (ED - Information included in AVS)          Disposition: Case was staffed with Reita Cliche DNP who recommended a inpatient admission (Geropsychiatry) to assist with stabilization. Disposition Initial Assessment Completed for this Encounter: Yes Disposition of Patient: Admit Type of inpatient treatment program: Adult(([facility])) Patient refused recommended treatment: No Mode of transportation if patient is discharged?: (Unk)  On Site Evaluation by:   Reviewed with Physician:    Mamie Nick 08/25/2018 12:13 PM

## 2018-08-25 NOTE — ED Notes (Addendum)
Patient transported to MRI 

## 2018-08-26 ENCOUNTER — Emergency Department (HOSPITAL_COMMUNITY): Payer: Medicare Other

## 2018-08-26 ENCOUNTER — Ambulatory Visit: Payer: Medicare Other | Admitting: Physician Assistant

## 2018-08-26 DIAGNOSIS — R45851 Suicidal ideations: Secondary | ICD-10-CM

## 2018-08-26 DIAGNOSIS — Z818 Family history of other mental and behavioral disorders: Secondary | ICD-10-CM

## 2018-08-26 DIAGNOSIS — Z87891 Personal history of nicotine dependence: Secondary | ICD-10-CM

## 2018-08-26 DIAGNOSIS — F29 Unspecified psychosis not due to a substance or known physiological condition: Secondary | ICD-10-CM

## 2018-08-26 LAB — BASIC METABOLIC PANEL
Anion gap: 11 (ref 5–15)
BUN: 10 mg/dL (ref 8–23)
CO2: 30 mmol/L (ref 22–32)
Calcium: 10 mg/dL (ref 8.9–10.3)
Chloride: 89 mmol/L — ABNORMAL LOW (ref 98–111)
Creatinine, Ser: 0.61 mg/dL (ref 0.44–1.00)
Glucose, Bld: 151 mg/dL — ABNORMAL HIGH (ref 70–99)
POTASSIUM: 3.1 mmol/L — AB (ref 3.5–5.1)
Sodium: 130 mmol/L — ABNORMAL LOW (ref 135–145)

## 2018-08-26 MED ORDER — POTASSIUM CHLORIDE CRYS ER 20 MEQ PO TBCR
40.0000 meq | EXTENDED_RELEASE_TABLET | Freq: Once | ORAL | Status: AC
  Administered 2018-08-26: 40 meq via ORAL
  Filled 2018-08-26: qty 2

## 2018-08-26 MED ORDER — RISPERIDONE 0.5 MG PO TABS
0.5000 mg | ORAL_TABLET | Freq: Every day | ORAL | Status: DC
  Administered 2018-08-26 – 2018-08-27 (×2): 0.5 mg via ORAL
  Filled 2018-08-26 (×2): qty 1

## 2018-08-26 NOTE — ED Notes (Signed)
Aletta Edouard, MD made aware of patient BMP and low potassium. Explained I have paged Starkes, NP/PA with no answer. Waiting for orders.

## 2018-08-26 NOTE — Consult Note (Signed)
Unable to assess patient since she is asleep and unable to arouse. Still continues to need geropsychiatric placement due to psychosis in setting of cognitive impairment.   Buford Dresser, DO 08/26/18 11:39 AM

## 2018-08-26 NOTE — ED Notes (Signed)
Patient given a meal tray.

## 2018-08-26 NOTE — BH Assessment (Signed)
[place] Assessment Progress Note    Per Dr. Mariea Clonts, patient continues to need gero-psych placement

## 2018-08-26 NOTE — ED Notes (Addendum)
Paged [name] on amion about patients updated BMP, Potassium-3.1 and sodium-130.

## 2018-08-26 NOTE — Consult Note (Addendum)
[facility] Consult   Reason for Consult:  Suicidal ideations Referring Physician:  Dr. Betsey Holiday Patient Identification: [name] MRN:  [id number] Principal Diagnosis: Psychosis [facility]) Diagnosis:   Patient Active Problem List   Diagnosis Date Noted  . Gait disorder [R26.9] 06/20/2018  . 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 Aortic atherosclerosis ([place]) [I70.0] 01/06/2017  . Ileus ([place]) [K56.7] 10/02/2015  . PCP NOTES >>>>>>>>>>>>>>>>>>>>>>>>>>>>>> [Z09] 09/03/2015  . Recurrent cancer of left breast ([place]) [C50.912] 03/16/2015  . Diplopia [H53.2] 10/07/2014  . Insomnia [G47.00] 08/31/2012  . GERD (gastroesophageal reflux disease) [K21.9] 08/31/2012  . Hypertension [I10] 11/26/2011  . Annual physical exam [Y18.56] 08/29/2011  . Hives/ angioedema [L50.9] 06/22/2011  . CONSTIPATION, CHRONIC [K59.09] 02/05/2011  . ALLERGIC REACTION, ACUTE [T78.40XA] 11/27/2010  . HERPES SIMPLEX INFECTION [B00.9] 10/17/2010  . SHOULDER, PAIN [M25.519] 04/07/2010  . LEG PAIN [M79.609] 06/09/2009  . ABDOMINAL PAIN, LEFT LOWER QUADRANT [R10.32] 06/09/2009  . Hyperlipidemia [E78.5] 11/04/2007  . DM II (diabetes mellitus, type II), controlled ([place]) [E11.9] 08/04/2007  . OBESITY [E66.9] 05/16/2007  . Obstructive sleep apnea [G47.33] 05/16/2007  . Diabetic neuropathy ([place]) [E11.40] 05/16/2007  . Seasonal allergic rhinitis [J30.2] 05/16/2007  . Moderate persistent asthmatic bronchitis with acute exacerbation [J45.41] 05/16/2007  . Osteopenia [M85.80] 05/16/2007  . BREAST CANCER, HX OF [Z85.3] 05/16/2007  . COLONIC POLYPS, HX OF [Z86.010] 05/16/2007    Total Time spent with patient: 15 minutes  Subjective:   [name] is a 77 y.o. female patient admitted with paranoia and mental status changes for more than 1 week. She was started on Lexapro by psychiatry.   HPI: Patient evaluated for paranoia and mental status changes.  This has apparently been  ongoing for more than a week.  She was seen by her primary care doctor for this, referred to psychiatry and started on Lexapro.  She has not actually started the medication however.  She was seen at urgent care with concerns over possible urinary tract infection causing her mental status changes.  They started her on Cipro, but reviewing the ER and it did not appear to be particularly infected.  Culture is still pending.  Patient now in the ER because she thinks that she has been having paranoid thoughts and is concerned about this.  She appears somewhat confused and cannot answer questions appropriately.  Past Psychiatric History: Mild cognitive impairment.   Risk to Self: Suicidal Ideation: Yes-Currently Present Suicidal Intent: No Is patient at risk for suicide?: No Suicidal Plan?: No Access to Means: No What has been your use of drugs/alcohol within the last 12 months?: Denies How many times?: 0 Other Self Harm Risks: (Fall risk altered mental state) Triggers for Past Attempts: Unknown Intentional Self Injurious Behavior: None Risk to Others: Homicidal Ideation: No Thoughts of Harm to Others: No Current Homicidal Intent: No Current Homicidal Plan: No Access to Homicidal Means: No Identified Victim: NA History of harm to others?: No Assessment of Violence: None Noted Violent Behavior Description: NA Does patient have access to weapons?: No Criminal Charges Pending?: No Does patient have a court date: No Prior Inpatient Therapy: Prior Inpatient Therapy: No Prior Outpatient Therapy: Prior Outpatient Therapy: No Does patient have an ACCT team?: No Does patient have Intensive In-House Services?  : No Does patient have [facility] services? : No Does patient have P4CC services?: No  Past Medical History:  Past Medical History:  Diagnosis Date  . Allergic rhinitis   . Anal fissure   . Asthma  dx in adulthood (77 y/o) PFT 09/08/09 FEV1 1.70/81%; FEV1/FVC 0.75; +resp to dilartor; NI  LV/DLCO  . Breast CA ([place])    hx of bilateral diagnosied in 2006, recurred 2011- [name]  . Chronic constipation   . Colonic polyp   . COPD (chronic obstructive pulmonary disease) ([place])   . Diabetes mellitus   . Dislocation of right elbow 2016  . HTN (hypertension) 11-2011  . Hyperlipidemia   . OSA (obstructive sleep apnea)    CPAP intolerant  . Osteopenia   . Peripheral neuropathy   . 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 Urinary retention    During massive constipation (pelvic exam, suspect 8cm or more in diameter)    Past Surgical History:  Procedure Laterality Date  . ANAL RECTAL MANOMETRY N/A 04/23/2016   Procedure: ANO RECTAL MANOMETRY;  Surgeon: [name], MD;  Location: [facility] ENDOSCOPY;  Service: Endoscopy;  Laterality: N/A;  . APPENDECTOMY     08/2004  . cyct on spine      1967  . ELBOW SURGERY Right 2016   DISLOCATION  . EXPLORATORY LAPAROTOMY    . FLEXIBLE SIGMOIDOSCOPY N/A 01/31/2013   Procedure: FLEXIBLE SIGMOIDOSCOPY;  Surgeon: [name], MD;  Location: [facility] ENDOSCOPY;  Service: Endoscopy;  Laterality: N/A;  unprepped with sedation  . IR ANGIOGRAM PELVIS SELECTIVE OR SUPRASELECTIVE  05/22/2017  . IR ANGIOGRAM SELECTIVE EACH ADDITIONAL VESSEL  05/22/2017  . IR RADIOLOGIST EVAL & MGMT  04/23/2017  . IR RADIOLOGIST EVAL & MGMT  06/26/2017  . IR US GUIDE BX ASP/DRAIN  06/14/2017  . IR US GUIDE VASC ACCESS LEFT  05/22/2017  . lypoma removed from back-benign 2005     2005  . MASTECTOMY Bilateral 2006  . Pilonidal Cyst Surgery     ? of  . removal of boils     1970/1980   Family History:  Family History  Problem Relation Age of Onset  . COPD Mother        smoker  . Hypertension Mother   . Lung cancer Father        died age 45  . Breast cancer Sister   . Diabetes Brother   . Breast cancer Maternal Grandmother   . Schizophrenia Son   . Autism Grandchild        x 2 grandsons  . Colon cancer Neg Hx   . Heart attack Neg Hx         no h/o early diseae   Family Psychiatric  History: As stated above.  Social History:  Social History   Substance and Sexual Activity  Alcohol Use No     Social History   Substance and Sexual Activity  Drug Use No    Social History   Socioeconomic History  . Marital status: Widowed    Spouse name: Not on file  . Number of children: 2  . Years of education: Not on file  . Highest education level: Not on file  Occupational History  . Occupation: retired    Fish farm manager: RETIRED  Social Needs  . Financial resource strain: Not on file  . Food insecurity:    Worry: Not on file    Inability: Not on file  . Transportation needs:    Medical: Not on file    Non-medical: Not on file  Tobacco Use  . Smoking status: Former Smoker    Packs/day: 1.00    Years: 4.00    Pack years: 4.00    Types: Cigarettes  Last attempt to quit: 04/01/1968    Years since quitting: 50.4  . Smokeless tobacco: Never Used  . Tobacco comment: Quit at age 23  Substance and Sexual Activity  . Alcohol use: No  . Drug use: No  . Sexual activity: Not Currently  Lifestyle  . Physical activity:    Days per week: Not on file    Minutes per session: Not on file  . Stress: Not on file  Relationships  . Social connections:    Talks on phone: Not on file    Gets together: Not on file    Attends religious service: Not on file    Active member of club or organization: Not on file    Attends meetings of clubs or organizations: Not on file    Relationship status: Not on file  Other Topics Concern  . 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 husband 4-10, her disable child lives w/ her, he does most of physical work at home    Does drive, plays cards, manage her finances, cook   retired Pharmacist, hospital   2 children    [place] twins     Additional Social History: N/A    Allergies:   Allergies  Allergen Reactions  . Peanut-Containing Drug Products Swelling  . Penicillins Itching    Has patient had a  PCN reaction causing immediate rash, facial/tongue/throat swelling, SOB or lightheadedness with hypotension: No Has patient had a PCN reaction causing severe rash involving mucus membranes or skin necrosis: No Has patient had a PCN reaction that required hospitalization: No Has patient had a PCN reaction occurring within the last 10 years: No If all of the above answers are "NO", then may proceed with Cephalosporin use.  "outer rectal itching"    Labs:  Results for orders placed or performed during the hospital encounter of 08/24/18 (from the past 48 hour(s))  Urinalysis, Routine w reflex microscopic     Status: Abnormal   Collection Time: 08/25/18 12:20 AM  Result Value Ref Range   Color, Urine YELLOW YELLOW   APPearance CLEAR CLEAR   Specific Gravity, Urine 1.008 1.005 - 1.030   pH 5.0 5.0 - 8.0   Glucose, UA NEGATIVE NEGATIVE mg/dL   Hgb urine dipstick MODERATE (A) NEGATIVE   Bilirubin Urine NEGATIVE NEGATIVE   Ketones, ur NEGATIVE NEGATIVE mg/dL   Protein, ur NEGATIVE NEGATIVE mg/dL   Nitrite NEGATIVE NEGATIVE   Leukocytes, UA SMALL (A) NEGATIVE   RBC / HPF 0-5 0 - 5 RBC/hpf   WBC, UA 0-5 0 - 5 WBC/hpf   Bacteria, UA NONE SEEN NONE SEEN   Squamous Epithelial / LPF 0-5 0 - 5    Comment: Performed at [facility], [place] [address], [place], [place] [address]  Rapid urine drug screen (hospital performed)     Status: None   Collection Time: 08/25/18 12:20 AM  Result Value Ref Range   Opiates NONE DETECTED NONE DETECTED   Cocaine NONE DETECTED NONE DETECTED   Benzodiazepines NONE DETECTED NONE DETECTED   Amphetamines NONE DETECTED NONE DETECTED   Tetrahydrocannabinol NONE DETECTED NONE DETECTED   Barbiturates NONE DETECTED NONE DETECTED    Comment: (NOTE) DRUG SCREEN FOR MEDICAL PURPOSES ONLY.  IF CONFIRMATION IS NEEDED FOR ANY PURPOSE, NOTIFY LAB WITHIN 5 DAYS. LOWEST DETECTABLE LIMITS FOR URINE DRUG SCREEN Drug Class                     Cutoff  (  ng/mL) Amphetamine and metabolites    1000 Barbiturate and metabolites    200 Benzodiazepine                 425 Tricyclics and metabolites     300 Opiates and metabolites        300 Cocaine and metabolites        300 THC                            50 Performed at [facility] [address] [place], [place] [address]   CBC with Differential/Platelet     Status: None   Collection Time: 08/25/18  1:37 AM  Result Value Ref Range   WBC 7.6 4.0 - 10.5 K/uL   RBC 4.66 3.87 - 5.11 MIL/uL   Hemoglobin 14.4 12.0 - 15.0 g/dL   HCT 40.7 36.0 - 46.0 %   MCV 87.3 78.0 - 100.0 fL   MCH 30.9 26.0 - 34.0 pg   MCHC 35.4 30.0 - 36.0 g/dL   RDW 12.9 11.5 - 15.5 %   Platelets 343 150 - 400 K/uL   Neutrophils Relative % 67 %   Neutro Abs 5.1 1.7 - 7.7 K/uL   Lymphocytes Relative 18 %   Lymphs Abs 1.4 0.7 - 4.0 K/uL   Monocytes Relative 13 %   Monocytes Absolute 1.0 0.1 - 1.0 K/uL   Eosinophils Relative 1 %   Eosinophils Absolute 0.1 0.0 - 0.7 K/uL   Basophils Relative 1 %   Basophils Absolute 0.0 0.0 - 0.1 K/uL    Comment: Performed at [facility], [place] [address] [place], [place] [address]  Comprehensive metabolic panel     Status: Abnormal   Collection Time: 08/25/18  1:37 AM  Result Value Ref Range   Sodium 127 (L) 135 - 145 mmol/L   Potassium 3.1 (L) 3.5 - 5.1 mmol/L   Chloride 87 (L) 98 - 111 mmol/L   CO2 28 22 - 32 mmol/L   Glucose, Bld 103 (H) 70 - 99 mg/dL   BUN 24 (H) 8 - 23 mg/dL   Creatinine, Ser 1.10 (H) 0.44 - 1.00 mg/dL   Calcium 9.7 8.9 - 10.3 mg/dL   Total Protein 7.0 6.5 - 8.1 g/dL   Albumin 4.1 3.5 - 5.0 g/dL   AST 32 15 - 41 U/L   ALT 13 0 - 44 U/L   Alkaline Phosphatase 39 38 - 126 U/L   Total Bilirubin 1.5 (H) 0.3 - 1.2 mg/dL   GFR calc non Af Amer 48 (L) >60 mL/min   GFR calc Af Amer 55 (L) >60 mL/min    Comment: (NOTE) The eGFR has been calculated using the CKD EPI equation. This calculation has not been validated in  all clinical situations. eGFR's persistently <60 mL/min signify possible Chronic Kidney Disease.    Anion gap 12 5 - 15    Comment: Performed at [facility], [place] [address], [place], [place] [address]  Ammonia     Status: None   Collection Time: 08/25/18  1:41 AM  Result Value Ref Range   Ammonia 12 9 - 35 umol/L    Comment: Performed at [facility], [place] [address], [place], [place] [address]  Ethanol     Status: None   Collection Time: 08/25/18  1:41 AM  Result Value Ref Range   Alcohol, Ethyl (B) <10 <10 mg/dL  Comment: (NOTE) Lowest detectable limit for serum alcohol is 10 mg/dL. For medical purposes only. Performed at [facility], [place] [address], [place], [place] [address]   Lactic acid, plasma     Status: None   Collection Time: 08/25/18  1:41 AM  Result Value Ref Range   Lactic Acid, Venous 1.2 0.5 - 1.9 mmol/L    Comment: Performed at [facility], [place] [address] [place], [place] [address]  Magnesium     Status: None   Collection Time: 08/25/18  5:12 AM  Result Value Ref Range   Magnesium 1.8 1.7 - 2.4 mg/dL    Comment: Performed at [facility], [place] [address], [place], [place] [address]  Basic metabolic panel     Status: Abnormal   Collection Time: 08/26/18  9:57 AM  Result Value Ref Range   Sodium 130 (L) 135 - 145 mmol/L   Potassium 3.1 (L) 3.5 - 5.1 mmol/L   Chloride 89 (L) 98 - 111 mmol/L   CO2 30 22 - 32 mmol/L   Glucose, Bld 151 (H) 70 - 99 mg/dL   BUN 10 8 - 23 mg/dL   Creatinine, Ser 0.61 0.44 - 1.00 mg/dL   Calcium 10.0 8.9 - 10.3 mg/dL   GFR calc non Af Amer >60 >60 mL/min   GFR calc Af Amer >60 >60 mL/min    Comment: (NOTE) The eGFR has been calculated using the CKD EPI equation. This calculation has not been validated in all clinical situations. eGFR's persistently <60 mL/min signify possible Chronic Kidney Disease.    Anion gap 11 5 - 15     Comment: Performed at [facility], [place] [address], [place], [place] [address]    Current Facility-Administered Medications  Medication Dose Route Frequency Provider Last Rate Last Dose  . acetaminophen (TYLENOL) tablet 650 mg  650 mg Oral Q4H PRN Pollina, Gwenyth Allegra, MD      . albuterol (PROVENTIL HFA;VENTOLIN HFA) 108 (90 Base) MCG/ACT inhaler 2 puff  2 puff Inhalation Q4H PRN Pollina, Gwenyth Allegra, MD      . ciprofloxacin (CIPRO) tablet 500 mg  500 mg Oral BID Orpah Greek, MD   500 mg at 08/26/18 0923  . escitalopram (LEXAPRO) tablet 10 mg  10 mg Oral Daily Orpah Greek, MD   10 mg at 08/26/18 0924  . letrozole [facility]) tablet 2.5 mg  2.5 mg Oral Daily Orpah Greek, MD   2.5 mg at 08/26/18 0921  . metFORMIN (GLUCOPHAGE) tablet 1,000 mg  1,000 mg Oral Daily Orpah Greek, MD   1,000 mg at 08/26/18 0924  . mometasone-formoterol (DULERA) 200-5 MCG/ACT inhaler 2 puff  2 puff Inhalation BID Orpah Greek, MD   2 puff at 08/26/18 0730  . ondansetron (ZOFRAN) tablet 4 mg  4 mg Oral Q8H PRN Pollina, Gwenyth Allegra, MD      . simvastatin (ZOCOR) tablet 40 mg  40 mg Oral QHS Orpah Greek, MD   40 mg at 08/25/18 2309  . traZODone (DESYREL) tablet 50 mg  50 mg Oral QHS Orpah Greek, MD   50 mg at 08/25/18 2309  . triamterene-hydrochlorothiazide (MAXZIDE-25) 37.5-25 MG per tablet 1 tablet  1 tablet Oral Daily Pollina, Gwenyth Allegra, MD   1 tablet at 08/26/18 6659   Current Outpatient Medications  Medication Sig Dispense Refill  . albuterol (VENTOLIN HFA) 108 (90 Base) MCG/ACT inhaler INHALE 2 PUFFS INTO THE LUNGS EVERY 4  HOURS AS NEEDED FOR WHEEZING OR SHORTNESS OF BREATH 18 g 12  . AMBULATORY NON FORMULARY MEDICATION Medication Name: Nitroglycerin ointment 0.125 % 23 times daily for 4-6 weeks  Use Pea sized amount per rectum 30 g 1  . aspirin EC 81 MG tablet Take 81 mg by mouth daily.    . budesonide-formoterol  (SYMBICORT) 160-4.5 MCG/ACT inhaler Inhale 2 puffs then rinse mouth, twice daily (Patient taking differently: Inhale 2 puffs into the lungs 2 (two) times daily as needed (shortness of breath). ) 1 Inhaler 12  . calcium-vitamin D (OSCAL WITH D) 250-125 MG-UNIT tablet Take 1 tablet by mouth daily.    . ciprofloxacin (CIPRO) 500 MG tablet Take 1 tablet (500 mg total) by mouth 2 (two) times daily for 14 days. 28 tablet 0  . EPINEPHrine (EPIPEN 2-PAK) 0.3 mg/0.3 mL IJ SOAJ injection Inject 0.3 mLs (0.3 mg total) into the muscle once. (Patient taking differently: Inject 0.3 mg into the muscle daily as needed (allergic reaction). ) 2 Device 1  . fexofenadine (ALLEGRA) 180 MG tablet Take 180 mg by mouth daily.     . magnesium hydroxide (MILK OF MAGNESIA) 400 MG/5ML suspension Take 15 mLs by mouth daily as needed for mild constipation.    . metFORMIN (GLUCOPHAGE) 1000 MG tablet Take 1 tablet (1,000 mg total) by mouth daily. 90 tablet 1  . Multiple Vitamin (MULTIVITAMIN WITH MINERALS) TABS tablet Take 1 tablet by mouth daily.    . simvastatin (ZOCOR) 40 MG tablet Take 1 tablet (40 mg total) by mouth at bedtime. 90 tablet 2  . traZODone (DESYREL) 50 MG tablet Take 1 tablet (50 mg total) by mouth at bedtime. 30 tablet 5  . triamterene-hydrochlorothiazide (MAXZIDE-25) 37.5-25 MG tablet Take 1 tablet by mouth daily. 90 tablet 1  . AMITIZA 24 MCG capsule TAKE 1 CAPSULE (24 MCG TOTAL) BY MOUTH 2 (TWO) TIMES DAILY WITH A MEAL. (Patient taking differently: Take 24 mcg by mouth 2 (two) times daily with a meal. ) 180 capsule 0  . AMITIZA 8 MCG capsule TAKE 1 CAPSULE (8 MCG TOTAL) BY MOUTH 2 (TWO) TIMES DAILY WITH A MEAL. 180 capsule 1  . escitalopram (LEXAPRO) 10 MG tablet Take 1 tablet (10 mg total) by mouth daily. 30 tablet 1  . glucose blood (ONE TOUCH ULTRA TEST) test strip Check blood sugars daily. 100 each 12  . lactulose (CHRONULAC) 10 GM/15ML solution Take 15 mLs (10 g total) by mouth 3 (three) times daily as  needed. (Patient not taking: Reported on 08/25/2018) 500 mL 2  . letrozole (FEMARA) 2.5 MG tablet TAKE 1 TABLET BY MOUTH EVERY DAY 90 tablet 1  . ONE TOUCH LANCETS MISC Use as directed once daily to check blood sugar.  DX E11.9 200 each 2    Musculoskeletal: Strength & Muscle Tone: within normal limits Gait & Station: normal Patient leans: N/A  Psychiatric Specialty Exam: Physical Exam  Nursing note and vitals reviewed. Constitutional: She appears well-developed and well-nourished.  HENT:  Head: Normocephalic and atraumatic.  Neck: Normal range of motion.  Respiratory: Effort normal.  Musculoskeletal: Normal range of motion.  Neurological: She is alert.  Oriented to self.  Psychiatric: She has a normal mood and affect. Her behavior is normal. Judgment and thought content normal. Cognition and memory are impaired.    Review of Systems  Unable to perform ROS: Mental status change    Blood pressure (!) 105/56, pulse 66, temperature 97.8 F (36.6 C), temperature source Oral, resp. rate 17,  SpO2 98 %.There is no height or weight on file to calculate BMI.  General Appearance: Disheveled  Eye Contact:  Fair  Speech:  Clear and Coherent and Normal Rate  Volume:  Normal  Mood:  Depressed  Affect:  Depressed  Thought Process:  Linear and Descriptions of Associations: Intact  Orientation:  Full (Time, Place, and Person)  Thought Content:  Paranoid Ideation  Suicidal Thoughts:  Yes.  with intent/plan  Homicidal Thoughts:  No  Memory:  Immediate;   Fair Recent;   Fair  Judgement:  Poor  Insight:  Lacking  Psychomotor Activity:  Normal  Concentration:  Concentration: Fair and Attention Span: Fair  Recall:  [employer] of Knowledge:  Fair  Language:  Fair  Akathisia:  No  Handed:  Right  AIMS (if indicated):   N/A  Assets:  Agricultural consultant Housing Physical Health Vocational/Educational  ADL's:  Intact  Cognition:  WNL  Sleep:   N/A      Treatment Plan Summary: Daily contact with patient to assess and evaluate symptoms and progress in treatment and Medication management  Disposition: Recommend psychiatric Inpatient admission when medically cleared. Discussed crisis plan, support from social network, calling 911, coming to the Emergency Department, and calling Suicide Hotline. Recommend inpatient admission at this time.   -Start Risperdal 0.5 mg daily for psychosis.   Nanci Pina, [place] 08/26/2018 11:28 AM   Patient seen face-to-face for psychiatric evaluation, chart reviewed and case discussed with the physician extender and developed treatment plan. Reviewed the information documented and agree with the treatment plan.  Buford Dresser, DO 08/26/18 8:16 PM

## 2018-08-26 NOTE — ED Notes (Signed)
Called over to TCU. TCU is full at this time and unable to receive patient. Notes show patient is waiting for geropsychiatry. Will continue to monitor.

## 2018-08-27 NOTE — ED Notes (Addendum)
Deputy present to transport pt to [facility] after he found out pt is going to [place] he stated he can not transfer. At present he is talking with someone regarding transport.

## 2018-08-27 NOTE — Progress Notes (Signed)
Pt. meets criteria for inpatient treatment per Leilani Merl, DO.  Referred out to the following hospitals: [facility]   And RE-REFERRED to the following: [facility]   [facility]   [facility]    Disposition CSW will continue to follow for placement.  Areatha Keas. Judi Cong, MSW, [place] Disposition Clinical Social Work [phone] (cell) [phone] (office)

## 2018-08-27 NOTE — ED Notes (Signed)
Pt family at bedsie wanting to know an update from doctor since she hasnt seen one since yesterday when pt was on the ED acute side.  Called TTS and made them aware.  Was informed pt still waiting for geri-psych placement and no medication changes. Made family aware of plan.

## 2018-08-27 NOTE — Progress Notes (Signed)
Pt accepted to [facility] Dr.Angela Narda Amber is the accepting provider/attending provider.  Call report to [phone] Ask for [address]  [facility] ED notified.   Pt is to be IVC'd prior to transport. Please fax [facility] to [facility] [phone] Attn: Jasmine  Pt may be transported by Law Enforcement Pt scheduled  to arrive at as soon as transport can be arranged.  Areatha Keas. Judi Cong, MSW, [place] Disposition Clinical Social Work [phone] (cell) [phone] (office)

## 2018-08-27 NOTE — Progress Notes (Signed)
Family notified of pt's departure. Chimere Klingensmith at ph: [phone]  Please reconsult if future social work needs arise.  CSW signing off, as social work intervention is no longer needed.  Alphonse Guild. Khyla Mccumbers, LCSW, LCAS, CSI Clinical Social Worker Ph: [phone]

## 2018-08-27 NOTE — ED Notes (Signed)
[facility] called asking about pt's IVC papers. Informed that patient wasn't IVC'd at this time.  Asking if patient can sign consent form or not.  Requesting for IVC papers to be taken out by patient for placement.

## 2018-08-27 NOTE — ED Notes (Signed)
Pt cleaned after urinating on self. Pt elects not to eat but did drink 2 cups of liquids.

## 2018-08-27 NOTE — ED Notes (Signed)
At present waiting for transport, they will call 30 min out.

## 2018-08-27 NOTE — ED Notes (Signed)
Transport 15 min out. Daughter Baker Janus has pts purse and phone charger, 2 personal belonging bags to go with pt.

## 2018-08-27 NOTE — BH Assessment (Signed)
[place] Assessment Progress Note  Pt reassessed today. Pt continues to display significant thought blocking and confusion. IP treatment continues to be recommended, per Dr. Mariea Clonts and Jinny Blossom, NP.   Kenna Gilbert. Lovena Le, [place], [place], LPCA Counselor

## 2018-08-27 NOTE — ED Notes (Signed)
Kennyth Lose with TTS made aware that patient is needing IVC papers geri-psych placement.

## 2018-08-28 ENCOUNTER — Telehealth: Payer: Self-pay

## 2018-08-28 NOTE — Telephone Encounter (Signed)
Called patient to see if ED follow appointment needed. Daughter states patient has been transferred to [facility] in [place] , [place].C.Daughter states they are looking to get patient closer to this area.Would like to know if you have any suggestions.

## 2018-08-28 NOTE — Telephone Encounter (Signed)
Daughter notified. Agreed to speak with Social Services.

## 2018-08-28 NOTE — Telephone Encounter (Signed)
Suggest them talk with their social worker to figure out what facility in this area can take care of her, a lot depends on her insurance/coverage.

## 2018-08-29 ENCOUNTER — Encounter: Payer: Medicare Other | Admitting: Gastroenterology

## 2018-09-06 ENCOUNTER — Inpatient Hospital Stay (HOSPITAL_COMMUNITY): Payer: Medicare Other

## 2018-09-06 ENCOUNTER — Inpatient Hospital Stay (HOSPITAL_COMMUNITY)
Admission: AD | Admit: 2018-09-06 | Discharge: 2018-10-10 | DRG: 388 | Disposition: E | Payer: Medicare Other | Source: Other Acute Inpatient Hospital | Attending: Internal Medicine | Admitting: Internal Medicine

## 2018-09-06 DIAGNOSIS — Z853 Personal history of malignant neoplasm of breast: Secondary | ICD-10-CM

## 2018-09-06 DIAGNOSIS — Z66 Do not resuscitate: Secondary | ICD-10-CM | POA: Diagnosis present

## 2018-09-06 DIAGNOSIS — M8580 Other specified disorders of bone density and structure, unspecified site: Secondary | ICD-10-CM | POA: Diagnosis present

## 2018-09-06 DIAGNOSIS — E1142 Type 2 diabetes mellitus with diabetic polyneuropathy: Secondary | ICD-10-CM | POA: Diagnosis present

## 2018-09-06 DIAGNOSIS — E785 Hyperlipidemia, unspecified: Secondary | ICD-10-CM | POA: Diagnosis present

## 2018-09-06 DIAGNOSIS — I10 Essential (primary) hypertension: Secondary | ICD-10-CM | POA: Diagnosis present

## 2018-09-06 DIAGNOSIS — K560 Paralytic ileus: Secondary | ICD-10-CM | POA: Diagnosis present

## 2018-09-06 DIAGNOSIS — Z833 Family history of diabetes mellitus: Secondary | ICD-10-CM | POA: Diagnosis not present

## 2018-09-06 DIAGNOSIS — G4733 Obstructive sleep apnea (adult) (pediatric): Secondary | ICD-10-CM | POA: Diagnosis not present

## 2018-09-06 DIAGNOSIS — E876 Hypokalemia: Secondary | ICD-10-CM | POA: Diagnosis present

## 2018-09-06 DIAGNOSIS — R140 Abdominal distension (gaseous): Secondary | ICD-10-CM | POA: Diagnosis present

## 2018-09-06 DIAGNOSIS — Z9013 Acquired absence of bilateral breasts and nipples: Secondary | ICD-10-CM

## 2018-09-06 DIAGNOSIS — Z79899 Other long term (current) drug therapy: Secondary | ICD-10-CM | POA: Diagnosis not present

## 2018-09-06 DIAGNOSIS — E119 Type 2 diabetes mellitus without complications: Secondary | ICD-10-CM

## 2018-09-06 DIAGNOSIS — K5904 Chronic idiopathic constipation: Secondary | ICD-10-CM | POA: Diagnosis not present

## 2018-09-06 DIAGNOSIS — F29 Unspecified psychosis not due to a substance or known physiological condition: Secondary | ICD-10-CM | POA: Diagnosis present

## 2018-09-06 DIAGNOSIS — Z803 Family history of malignant neoplasm of breast: Secondary | ICD-10-CM

## 2018-09-06 DIAGNOSIS — F0390 Unspecified dementia without behavioral disturbance: Secondary | ICD-10-CM | POA: Diagnosis present

## 2018-09-06 DIAGNOSIS — Z7951 Long term (current) use of inhaled steroids: Secondary | ICD-10-CM

## 2018-09-06 DIAGNOSIS — F28 Other psychotic disorder not due to a substance or known physiological condition: Secondary | ICD-10-CM | POA: Diagnosis not present

## 2018-09-06 DIAGNOSIS — Z9119 Patient's noncompliance with other medical treatment and regimen: Secondary | ICD-10-CM

## 2018-09-06 DIAGNOSIS — J449 Chronic obstructive pulmonary disease, unspecified: Secondary | ICD-10-CM | POA: Diagnosis present

## 2018-09-06 DIAGNOSIS — K5641 Fecal impaction: Secondary | ICD-10-CM | POA: Diagnosis not present

## 2018-09-06 DIAGNOSIS — Z7982 Long term (current) use of aspirin: Secondary | ICD-10-CM

## 2018-09-06 DIAGNOSIS — N816 Rectocele: Secondary | ICD-10-CM | POA: Diagnosis present

## 2018-09-06 DIAGNOSIS — Z87891 Personal history of nicotine dependence: Secondary | ICD-10-CM

## 2018-09-06 DIAGNOSIS — K567 Ileus, unspecified: Secondary | ICD-10-CM | POA: Diagnosis not present

## 2018-09-06 DIAGNOSIS — R4182 Altered mental status, unspecified: Secondary | ICD-10-CM | POA: Diagnosis not present

## 2018-09-06 DIAGNOSIS — K5902 Outlet dysfunction constipation: Secondary | ICD-10-CM

## 2018-09-06 DIAGNOSIS — Z794 Long term (current) use of insulin: Secondary | ICD-10-CM | POA: Diagnosis not present

## 2018-09-06 DIAGNOSIS — J45901 Unspecified asthma with (acute) exacerbation: Secondary | ICD-10-CM | POA: Diagnosis present

## 2018-09-06 DIAGNOSIS — N390 Urinary tract infection, site not specified: Secondary | ICD-10-CM | POA: Diagnosis present

## 2018-09-06 DIAGNOSIS — K668 Other specified disorders of peritoneum: Secondary | ICD-10-CM | POA: Diagnosis not present

## 2018-09-06 DIAGNOSIS — R109 Unspecified abdominal pain: Secondary | ICD-10-CM

## 2018-09-06 DIAGNOSIS — J4541 Moderate persistent asthma with (acute) exacerbation: Secondary | ICD-10-CM | POA: Diagnosis not present

## 2018-09-06 DIAGNOSIS — R1084 Generalized abdominal pain: Secondary | ICD-10-CM | POA: Diagnosis not present

## 2018-09-06 DIAGNOSIS — G9340 Encephalopathy, unspecified: Secondary | ICD-10-CM | POA: Diagnosis not present

## 2018-09-06 DIAGNOSIS — G9341 Metabolic encephalopathy: Secondary | ICD-10-CM | POA: Diagnosis present

## 2018-09-06 MED ORDER — FLUTICASONE-SALMETEROL 230-21 MCG/ACT IN AERO
2.00 | INHALATION_SPRAY | RESPIRATORY_TRACT | Status: DC
Start: 2018-09-07 — End: 2018-09-06

## 2018-09-06 MED ORDER — DEXTROSE 50 % IV SOLN
25.00 | INTRAVENOUS | Status: DC
Start: ? — End: 2018-09-06

## 2018-09-06 MED ORDER — INSULIN LISPRO 100 UNIT/ML ~~LOC~~ SOLN
0.00 | SUBCUTANEOUS | Status: DC
Start: 2018-09-07 — End: 2018-09-06

## 2018-09-06 MED ORDER — ONDANSETRON HCL 4 MG/2ML IJ SOLN
4.00 | INTRAMUSCULAR | Status: DC
Start: ? — End: 2018-09-06

## 2018-09-06 MED ORDER — KCL IN DEXTROSE-NACL 20-5-0.9 MEQ/L-%-% IV SOLN
75.00 | INTRAVENOUS | Status: DC
Start: ? — End: 2018-09-06

## 2018-09-06 MED ORDER — ACETAMINOPHEN 325 MG PO TABS
650.00 | ORAL_TABLET | ORAL | Status: DC
Start: ? — End: 2018-09-06

## 2018-09-06 MED ORDER — LETROZOLE 2.5 MG PO TABS
2.50 | ORAL_TABLET | ORAL | Status: DC
Start: 2018-09-07 — End: 2018-09-06

## 2018-09-06 MED ORDER — IPRATROPIUM-ALBUTEROL 0.5-2.5 (3) MG/3ML IN SOLN
3.00 | RESPIRATORY_TRACT | Status: DC
Start: ? — End: 2018-09-06

## 2018-09-07 ENCOUNTER — Other Ambulatory Visit: Payer: Self-pay

## 2018-09-07 ENCOUNTER — Inpatient Hospital Stay (HOSPITAL_COMMUNITY): Payer: Medicare Other

## 2018-09-07 ENCOUNTER — Encounter (HOSPITAL_COMMUNITY): Payer: Self-pay | Admitting: Internal Medicine

## 2018-09-07 DIAGNOSIS — K567 Ileus, unspecified: Secondary | ICD-10-CM

## 2018-09-07 DIAGNOSIS — I10 Essential (primary) hypertension: Secondary | ICD-10-CM

## 2018-09-07 DIAGNOSIS — K668 Other specified disorders of peritoneum: Secondary | ICD-10-CM

## 2018-09-07 LAB — BASIC METABOLIC PANEL
Anion gap: 10 (ref 5–15)
BUN: 13 mg/dL (ref 8–23)
CALCIUM: 10 mg/dL (ref 8.9–10.3)
CHLORIDE: 95 mmol/L — AB (ref 98–111)
CO2: 28 mmol/L (ref 22–32)
CREATININE: 0.7 mg/dL (ref 0.44–1.00)
Glucose, Bld: 96 mg/dL (ref 70–99)
Potassium: 3.1 mmol/L — ABNORMAL LOW (ref 3.5–5.1)
SODIUM: 133 mmol/L — AB (ref 135–145)

## 2018-09-07 LAB — GLUCOSE, CAPILLARY
Glucose-Capillary: 90 mg/dL (ref 70–99)
Glucose-Capillary: 105 mg/dL — ABNORMAL HIGH (ref 70–99)
Glucose-Capillary: 132 mg/dL — ABNORMAL HIGH (ref 70–99)

## 2018-09-07 LAB — CBC WITH DIFFERENTIAL/PLATELET
BASOS PCT: 0 %
Basophils Absolute: 0 10*3/uL (ref 0.0–0.1)
EOS PCT: 1 %
Eosinophils Absolute: 0.1 10*3/uL (ref 0.0–0.7)
HCT: 42 % (ref 36.0–46.0)
Hemoglobin: 14.5 g/dL (ref 12.0–15.0)
LYMPHS ABS: 1 10*3/uL (ref 0.7–4.0)
Lymphocytes Relative: 11 %
MCH: 30.7 pg (ref 26.0–34.0)
MCHC: 34.5 g/dL (ref 30.0–36.0)
MCV: 88.8 fL (ref 78.0–100.0)
Monocytes Absolute: 0.9 10*3/uL (ref 0.1–1.0)
Monocytes Relative: 10 %
NEUTROS PCT: 78 %
Neutro Abs: 7 10*3/uL (ref 1.7–7.7)
PLATELETS: 277 10*3/uL (ref 150–400)
RBC: 4.73 MIL/uL (ref 3.87–5.11)
RDW: 12.9 % (ref 11.5–15.5)
WBC: 9 10*3/uL (ref 4.0–10.5)

## 2018-09-07 LAB — HEPATIC FUNCTION PANEL
ALBUMIN: 3.9 g/dL (ref 3.5–5.0)
ALT: 17 U/L (ref 0–44)
AST: 23 U/L (ref 15–41)
Alkaline Phosphatase: 37 U/L — ABNORMAL LOW (ref 38–126)
BILIRUBIN INDIRECT: 0.9 mg/dL (ref 0.3–0.9)
Bilirubin, Direct: 0.2 mg/dL (ref 0.0–0.2)
Total Bilirubin: 1.1 mg/dL (ref 0.3–1.2)
Total Protein: 6.5 g/dL (ref 6.5–8.1)

## 2018-09-07 LAB — LACTIC ACID, PLASMA: LACTIC ACID, VENOUS: 1.1 mmol/L (ref 0.5–1.9)

## 2018-09-07 MED ORDER — POTASSIUM CHLORIDE 10 MEQ/100ML IV SOLN
10.0000 meq | INTRAVENOUS | Status: AC
  Administered 2018-09-07 (×3): 10 meq via INTRAVENOUS
  Filled 2018-09-07: qty 100

## 2018-09-07 MED ORDER — IPRATROPIUM BROMIDE 0.02 % IN SOLN
0.5000 mg | Freq: Four times a day (QID) | RESPIRATORY_TRACT | Status: DC
  Administered 2018-09-07: 0.5 mg via RESPIRATORY_TRACT
  Filled 2018-09-07: qty 2.5

## 2018-09-07 MED ORDER — LIP MEDEX EX OINT
TOPICAL_OINTMENT | CUTANEOUS | Status: AC
  Administered 2018-09-07: 20:00:00
  Filled 2018-09-07: qty 7

## 2018-09-07 MED ORDER — BUDESONIDE 0.25 MG/2ML IN SUSP: 0.2500 mg | Freq: Two times a day (BID) | RESPIRATORY_TRACT | Status: DC

## 2018-09-07 MED ORDER — INSULIN ASPART 100 UNIT/ML ~~LOC~~ SOLN
0.0000 [IU] | SUBCUTANEOUS | Status: DC
  Administered 2018-09-07: 1 [IU] via SUBCUTANEOUS
  Administered 2018-09-08: 3 [IU] via SUBCUTANEOUS
  Administered 2018-09-08 (×2): 1 [IU] via SUBCUTANEOUS
  Administered 2018-09-09: 7 [IU] via SUBCUTANEOUS

## 2018-09-07 MED ORDER — SODIUM CHLORIDE 0.9 % IV SOLN
1.0000 g | Freq: Two times a day (BID) | INTRAVENOUS | Status: DC
  Administered 2018-09-07: 1 g via INTRAVENOUS
  Filled 2018-09-07 (×2): qty 1

## 2018-09-07 MED ORDER — INFLUENZA VAC SPLIT HIGH-DOSE 0.5 ML IM SUSY
0.5000 mL | PREFILLED_SYRINGE | INTRAMUSCULAR | Status: DC
  Filled 2018-09-07: qty 0.5

## 2018-09-07 MED ORDER — KETOROLAC TROMETHAMINE 30 MG/ML IJ SOLN
15.0000 mg | Freq: Once | INTRAMUSCULAR | Status: AC
  Administered 2018-09-07: 15 mg via INTRAVENOUS
  Filled 2018-09-07: qty 1

## 2018-09-07 MED ORDER — POTASSIUM CHLORIDE 10 MEQ/100ML IV SOLN
INTRAVENOUS | Status: AC
  Filled 2018-09-07: qty 100

## 2018-09-07 MED ORDER — BUDESONIDE 0.25 MG/2ML IN SUSP
0.2500 mg | Freq: Two times a day (BID) | RESPIRATORY_TRACT | Status: DC
  Administered 2018-09-07 – 2018-09-08 (×3): 0.25 mg via RESPIRATORY_TRACT
  Filled 2018-09-07 (×4): qty 2

## 2018-09-07 MED ORDER — ALBUTEROL SULFATE (2.5 MG/3ML) 0.083% IN NEBU
2.5000 mg | INHALATION_SOLUTION | Freq: Four times a day (QID) | RESPIRATORY_TRACT | Status: DC
  Administered 2018-09-07: 2.5 mg via RESPIRATORY_TRACT
  Filled 2018-09-07: qty 3

## 2018-09-07 MED ORDER — ONDANSETRON HCL 4 MG/2ML IJ SOLN: 4.0000 mg | Freq: Four times a day (QID) | INTRAMUSCULAR | Status: DC | PRN

## 2018-09-07 MED ORDER — ACETAMINOPHEN 325 MG PO TABS: 650.0000 mg | ORAL_TABLET | Freq: Four times a day (QID) | ORAL | Status: DC | PRN

## 2018-09-07 MED ORDER — ACETAMINOPHEN 650 MG RE SUPP: 650.0000 mg | Freq: Four times a day (QID) | RECTAL | Status: DC | PRN

## 2018-09-07 MED ORDER — ONDANSETRON HCL 4 MG PO TABS: 4.0000 mg | ORAL_TABLET | Freq: Four times a day (QID) | ORAL | Status: DC | PRN

## 2018-09-07 MED ORDER — IPRATROPIUM-ALBUTEROL 0.5-2.5 (3) MG/3ML IN SOLN
3.0000 mL | Freq: Two times a day (BID) | RESPIRATORY_TRACT | Status: DC
  Administered 2018-09-07 – 2018-09-08 (×3): 3 mL via RESPIRATORY_TRACT
  Filled 2018-09-07 (×4): qty 3

## 2018-09-07 MED ORDER — KCL IN DEXTROSE-NACL 20-5-0.9 MEQ/L-%-% IV SOLN
INTRAVENOUS | Status: DC
  Administered 2018-09-07 (×2): via INTRAVENOUS
  Filled 2018-09-07 (×2): qty 1000

## 2018-09-07 MED ORDER — MAGNESIUM SULFATE 2 GM/50ML IV SOLN
2.0000 g | Freq: Once | INTRAVENOUS | Status: AC
  Administered 2018-09-07: 2 g via INTRAVENOUS
  Filled 2018-09-07: qty 50

## 2018-09-07 MED ORDER — ALBUTEROL SULFATE (2.5 MG/3ML) 0.083% IN NEBU: 2.5000 mg | INHALATION_SOLUTION | RESPIRATORY_TRACT | Status: DC | PRN

## 2018-09-07 NOTE — H&P (Addendum)
History and Physical    [name]:[id number] DOB: 1941-08-13 DOA: 08/25/2018  PCP: Colon Branch, MD  Patient coming from: Patient was transferred from [facility] at the request of patient's daughter.  Chief Complaint: Abdominal distention.  History obtained from the chart.  HPI: [name] is a 77 y.o. female with history of dementia, psychosis, diabetes mellitus type 2, hypertension, COPD history of breast cancer in remission who was admitted to [facility] on September 27 with complaint of abdominal distention where patient was brought in from behavioral health.  CT scan done over that showed mild gaseous distention of the majority of the colon with sigmoid displacement of the right upper quadrant.  There was large amount of stool within the descending and transverse colon this is favored to represent sequela from adynamic ileus and/or constipation.  Initially surgery was consulted and followed by gastroenterology who gave patient smog enema and also place patient on MiraLAX.  Planning decompression based on the results.  But patient's family wanted patient to be transferred to [facility].  On my exam patient denies any abdominal pain he has gaseous distention of the abdomen.  Acute abdomen shows possible pneumoperitoneum with possible obstruction versus ileus.  ED Course: Patient was a direct admit.  Review of Systems: As per HPI, rest all negative.   Past Medical History:  Diagnosis Date  . Allergic rhinitis   . Anal fissure   . Asthma    dx in adulthood (77 y/o) PFT 09/08/09 FEV1 1.70/81%; FEV1/FVC 0.75; +resp to dilartor; NI LV/DLCO  . Breast CA ([place])    hx of bilateral diagnosied in 2006, recurred 2011- [name]  . Chronic constipation   . Colonic polyp   . COPD (chronic obstructive pulmonary disease) ([place])   . Diabetes mellitus   . Dislocation of right elbow 2016  . HTN (hypertension) 11-2011  .  Hyperlipidemia   . OSA (obstructive sleep apnea)    CPAP intolerant  . Osteopenia   . Peripheral neuropathy   . 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 Urinary retention    During massive constipation (pelvic exam, suspect 8cm or more in diameter)    Past Surgical History:  Procedure Laterality Date  . ANAL RECTAL MANOMETRY N/A 04/23/2016   Procedure: ANO RECTAL MANOMETRY;  Surgeon: [name], MD;  Location: [facility] ENDOSCOPY;  Service: Endoscopy;  Laterality: N/A;  . APPENDECTOMY     08/2004  . cyct on spine      1967  . ELBOW SURGERY Right 2016   DISLOCATION  . EXPLORATORY LAPAROTOMY    . FLEXIBLE SIGMOIDOSCOPY N/A 01/31/2013   Procedure: FLEXIBLE SIGMOIDOSCOPY;  Surgeon: [name], MD;  Location: [facility] ENDOSCOPY;  Service: Endoscopy;  Laterality: N/A;  unprepped with sedation  . IR ANGIOGRAM PELVIS SELECTIVE OR SUPRASELECTIVE  05/22/2017  . IR ANGIOGRAM SELECTIVE EACH ADDITIONAL VESSEL  05/22/2017  . IR RADIOLOGIST EVAL & MGMT  04/23/2017  . IR RADIOLOGIST EVAL & MGMT  06/26/2017  . IR US GUIDE BX ASP/DRAIN  06/14/2017  . IR US GUIDE VASC ACCESS LEFT  05/22/2017  . lypoma removed from back-benign 2005     2005  . MASTECTOMY Bilateral 2006  . Pilonidal Cyst Surgery     ? of  . removal of boils     1970/1980     reports that she quit smoking about 50 years ago. Her smoking use included cigarettes. She has a 4.00 pack-year smoking history. She has  never used smokeless tobacco. She reports that she does not drink alcohol or use drugs.  Allergies  Allergen Reactions  . Peanut-Containing Drug Products Swelling  . Penicillins Itching    Has patient had a PCN reaction causing immediate rash, facial/tongue/throat swelling, SOB or lightheadedness with hypotension: No Has patient had a PCN reaction causing severe rash involving mucus membranes or skin necrosis: No Has patient had a PCN reaction that required hospitalization: No Has patient had a PCN reaction occurring  within the last 10 years: No If all of the above answers are "NO", then may proceed with Cephalosporin use.  "outer rectal itching"    Family History  Problem Relation Age of Onset  . COPD Mother        smoker  . Hypertension Mother   . Lung cancer Father        died age 48  . Breast cancer Sister   . Diabetes Brother   . Breast cancer Maternal Grandmother   . Schizophrenia Son   . Autism Grandchild        x 2 grandsons  . Colon cancer Neg Hx   . Heart attack Neg Hx        no h/o early diseae    Prior to Admission medications   Medication Sig Start Date End Date Taking? Authorizing Provider  acetaminophen (TYLENOL) 650 MG CR tablet Take 650 mg by mouth every 4 (four) hours as needed for pain or fever.   Yes [provider]  albuterol (VENTOLIN HFA) 108 (90 Base) MCG/ACT inhaler INHALE 2 PUFFS INTO THE LUNGS EVERY 4 HOURS AS NEEDED FOR WHEEZING OR SHORTNESS OF BREATH Patient taking differently: Inhale 2 puffs into the lungs every 4 (four) hours as needed for wheezing or shortness of breath.  06/09/18  Yes Young, Tarri Fuller D, MD  AMBULATORY NON FORMULARY MEDICATION Medication Name: Nitroglycerin ointment 0.125 % 23 times daily for 4-6 weeks  Use Pea sized amount per rectum 07/01/18  Yes Nandigam, Venia Minks, MD  AMITIZA 24 MCG capsule TAKE 1 CAPSULE (24 MCG TOTAL) BY MOUTH 2 (TWO) TIMES DAILY WITH A MEAL. Patient taking differently: Take 24 mcg by mouth 2 (two) times daily with a meal.  08/18/18  Yes Nandigam, Venia Minks, MD  aspirin EC 81 MG tablet Take 81 mg by mouth daily.   Yes [provider]  budesonide-formoterol (SYMBICORT) 160-4.5 MCG/ACT inhaler Inhale 2 puffs then rinse mouth, twice daily Patient taking differently: Inhale 2 puffs into the lungs 2 (two) times daily as needed (shortness of breath).  06/09/18  Yes Young, Tarri Fuller D, MD  calcium-vitamin D (OSCAL WITH D) 250-125 MG-UNIT tablet Take 1 tablet by mouth daily.   Yes [provider]  EPINEPHrine  (EPIPEN 2-PAK) 0.3 mg/0.3 mL IJ SOAJ injection Inject 0.3 mLs (0.3 mg total) into the muscle once. Patient taking differently: Inject 0.3 mg into the muscle daily as needed (allergic reaction).  04/11/15  Yes Paz, Alda Berthold, MD  fexofenadine (ALLEGRA) 180 MG tablet Take 180 mg by mouth daily.    Yes [provider]  fluticasone-salmeterol (ADVAIR HFA) 230-21 MCG/ACT inhaler Inhale 2 puffs into the lungs 2 (two) times daily.   Yes [provider]  glucose blood (ONE TOUCH ULTRA TEST) test strip Check blood sugars daily. 05/24/17  Yes Paz, Alda Berthold, MD  insulin lispro (HUMALOG) 100 UNIT/ML injection Inject 0-6 Units into the skin 3 (three) times daily before meals. Per sliding scale  BG < 160= 0 units 160-199=1  unit 200-239= 2 units 240-279= 3 units 280-319= 4 units 320-359= 5 units 360-399= 6 units BG > 400= 6 units and call MD   Yes [provider]  letrozole ([place]) 2.5 MG tablet TAKE 1 TABLET BY MOUTH EVERY DAY Patient taking differently: Take 2.5 mg by mouth daily.  08/01/18  Yes Alla Feeling, NP  magnesium hydroxide (MILK OF MAGNESIA) 400 MG/5ML suspension Take 15 mLs by mouth daily as needed for mild constipation.   Yes [provider]  metFORMIN (GLUCOPHAGE) 1000 MG tablet Take 1 tablet (1,000 mg total) by mouth daily. 12/25/17  Yes Paz, Alda Berthold, MD  Multiple Vitamin (MULTIVITAMIN WITH MINERALS) TABS tablet Take 1 tablet by mouth daily.   Yes [provider]  ONE TOUCH LANCETS MISC Use as directed once daily to check blood sugar.  DX E11.9 04/12/16  Yes Paz, Alda Berthold, MD  simvastatin (ZOCOR) 40 MG tablet Take 1 tablet (40 mg total) by mouth at bedtime. 05/01/18  Yes Paz, Alda Berthold, MD  traZODone (DESYREL) 50 MG tablet Take 1 tablet (50 mg total) by mouth at bedtime. Patient taking differently: Take 50 mg by mouth at bedtime as needed for sleep.  06/09/18  Yes Young, Clinton D, MD  AMITIZA 8 MCG capsule TAKE 1 CAPSULE (8 MCG TOTAL) BY MOUTH 2 (TWO) TIMES DAILY  WITH A MEAL. Patient not taking: Reported on 08/31/2018 08/25/18   Mauri Pole, MD  ciprofloxacin (CIPRO) 500 MG tablet Take 1 tablet (500 mg total) by mouth 2 (two) times daily for 14 days. Patient not taking: Reported on 09/05/2018 08/24/18 09/07/18  Scot Jun, FNP  escitalopram (LEXAPRO) 10 MG tablet Take 1 tablet (10 mg total) by mouth daily. 08/14/18   Colon Branch, MD  lactulose (CHRONULAC) 10 GM/15ML solution Take 15 mLs (10 g total) by mouth 3 (three) times daily as needed. Patient not taking: Reported on 08/25/2018 08/07/18   Colon Branch, MD  triamterene-hydrochlorothiazide (MAXZIDE-25) 37.5-25 MG tablet Take 1 tablet by mouth daily. Patient not taking: Reported on 08/13/2018 04/02/18   Colon Branch, MD    Physical Exam: Vitals:   09/08/2018 2253  BP: (!) 170/94  Pulse: 89  Resp: 15  Temp: 98.5 F (36.9 C)  TempSrc: Oral  SpO2: 95%  Weight: 60.6 kg      Constitutional: Moderately built and nourished. Vitals:   08/28/2018 2253  BP: (!) 170/94  Pulse: 89  Resp: 15  Temp: 98.5 F (36.9 C)  TempSrc: Oral  SpO2: 95%  Weight: 60.6 kg   Eyes: Anicteric no pallor. ENMT: No discharge from the ears eyes nose or mouth. Neck: No mass or.  No neck rigidity. Respiratory: No rhonchi or crepitations. Cardiovascular: S1-S2 heard no murmurs appreciated. Abdomen: Distended abdomen no rebound tenderness guarding or rigidity.  All sounds not appreciated. Musculoskeletal: No edema. Skin: No rash. Neurologic: Alert awake oriented to name and place.  Moves all extremities. Psychiatric: Appears confused.   Labs on Admission: I have personally reviewed following labs and imaging studies  CBC: Recent Labs  Lab 09/07/18 0046  WBC 9.0  NEUTROABS 7.0  HGB 14.5  HCT 42.0  MCV 88.8  PLT 951   Basic Metabolic Panel: Recent Labs  Lab 09/07/18 0046  NA 133*  K 3.1*  CL 95*  CO2 28  GLUCOSE 96  BUN 13  CREATININE 0.70  CALCIUM 10.0   GFR: Estimated Creatinine  Clearance: 49.5 mL/min (by C-G formula based on SCr of 0.7  mg/dL). Liver Function Tests: Recent Labs  Lab 09/07/18 0046  AST 23  ALT 17  ALKPHOS 37*  BILITOT 1.1  PROT 6.5  ALBUMIN 3.9   No results for input(s): LIPASE, AMYLASE in the last 168 hours. No results for input(s): AMMONIA in the last 168 hours. Coagulation Profile: No results for input(s): INR, PROTIME in the last 168 hours. Cardiac Enzymes: No results for input(s): CKTOTAL, CKMB, CKMBINDEX, TROPONINI in the last 168 hours. BNP (last 3 results) No results for input(s): PROBNP in the last 8760 hours. HbA1C: No results for input(s): HGBA1C in the last 72 hours. CBG: No results for input(s): GLUCAP in the last 168 hours. Lipid Profile: No results for input(s): CHOL, HDL, LDLCALC, TRIG, CHOLHDL, LDLDIRECT in the last 72 hours. Thyroid Function Tests: No results for input(s): TSH, T4TOTAL, FREET4, T3FREE, THYROIDAB in the last 72 hours. Anemia Panel: No results for input(s): VITAMINB12, FOLATE, FERRITIN, TIBC, IRON, RETICCTPCT in the last 72 hours. Urine analysis:    Component Value Date/Time   COLORURINE YELLOW 08/25/2018 0020   APPEARANCEUR CLEAR 08/25/2018 0020   LABSPEC 1.008 08/25/2018 0020   PHURINE 5.0 08/25/2018 0020   GLUCOSEU NEGATIVE 08/25/2018 0020   GLUCOSEU NEGATIVE 08/22/2015 1620   HGBUR MODERATE (A) 08/25/2018 0020   BILIRUBINUR NEGATIVE 08/25/2018 0020   BILIRUBINUR Negative 08/22/2015 1502   KETONESUR NEGATIVE 08/25/2018 0020   PROTEINUR NEGATIVE 08/25/2018 0020   UROBILINOGEN 0.2 08/24/2018 1455   NITRITE NEGATIVE 08/25/2018 0020   LEUKOCYTESUR SMALL (A) 08/25/2018 0020   Sepsis Labs: @LABRCNTIP (procalcitonin:4,lacticidven:4) )No results found for this or any previous visit (from the past 240 hour(s)).   Radiological Exams on Admission: Dg Abd Acute W/chest  Addendum Date: 09/07/2018   ADDENDUM REPORT: 09/07/2018 01:28 ADDENDUM: Acute findings discussed with and reconfirmed by Arna Snipe on 09/07/2018 at 1:25 am. Electronically Signed   By: Elon Alas M.D.   On: 09/07/2018 01:28   Result Date: 09/07/2018 CLINICAL DATA:  Abdominal pain and distension. EXAM: DG ABDOMEN ACUTE W/ 1V CHEST COMPARISON:  None. FINDINGS: Cardiomediastinal silhouette is normal. Mildly calcified aortic arch. LEFT lung base strandy densities. No pleural effusion or focal consolidation. No pneumothorax. No pneumothorax. Soft tissue planes and included osseous structures are unremarkable. Subdiaphragmatic air density. Gas distended small and large bowel difficult to discretely identify due to overlapping bowel. Dense stool distended rectum with flocculated distended stool in rectum. Soft tissue planes and included osseous structures are non suspicious. IMPRESSION: 1. No acute cardiopulmonary process. 2. Large amount of stool in rectum concerning for fecal impaction resulting in high-grade distal bowel obstruction versus severe ileus. 3. Subdiaphragmatic lucency suspicious for pneumoperitoneum, less likely gas distended bowel. Electronically Signed: By: Elon Alas M.D. On: 09/07/2018 00:46      Assessment/Plan Principal Problem:   Pneumoperitoneum Active Problems:   DM II (diabetes mellitus, type II), controlled ([place])   Obstructive sleep apnea   Moderate persistent asthmatic bronchitis with acute exacerbation   BREAST CANCER, HX OF   Hypertension   Ileus ([place])   Psychosis ([place])    1. Pneumoperitoneum with possible ileus versus obstruction -abdomen on exam is no rebound tenderness or rigidity.  Bowel sounds are appreciated.  I have discussed with on-call general surgeon Dr. Barry Dienes about x-ray findings.  Dr. Barry Dienes is requested to get CT abdomen and pelvis without contrast.  Will keep patient n.p.o. and IV fluids for now. 2. Diabetes mellitus type II we will keep patient on sliding scale coverage. 3. Hypertension we will keep  patient on PRN IV hydralazine. 4. Dementia with history of  psychosis.  Closely observe for now. 5. Sleep apnea noncompliant with CPAP. 6. History of breast cancer in remission. 7. History of asthma/COPD not wheezing.  PRN nebulizer.   DVT prophylaxis: SCDs. Code Status: DNR.  Patient has a form with the chart. Family Communication: No family at the bedside. Disposition Plan: To be determined. Consults called: General surgery. Admission status: Inpatient.   Rise Patience MD [facility] Pager [phone].  If 7PM-7AM, please contact night-coverage [email or web address] Password [email or web address]  09/07/2018, 1:44 AM

## 2018-09-07 NOTE — Consult Note (Signed)
Reason for Consult:abdominal distention Referring Physician: Zahra Peffley is an 77 y.o. female.  HPI:  Pt is a 77 yo F who was admitted to [facility] 9/27 for dramatic abdominal distention. She was transferred to [facility] long based on request of her daughter.  Scans showed colonic distention and stool.  Upon transfer here, a plain film was performed suggesting pneumoperitoneum.  A non contrast CT was then performed that showed that no free air was present, but the colon on the right was over the liver.    At this point, she denies pain, nausea or vomiting.  She had a small stool at [place] following an enema, but hasn't had one since.  There is no evidence of obstruction.    Past Medical History:  Diagnosis Date  . Allergic rhinitis   . Anal fissure   . Asthma    dx in adulthood (77 y/o) PFT 09/08/09 FEV1 1.70/81%; FEV1/FVC 0.75; +resp to dilartor; NI LV/DLCO  . Breast CA ([place])    hx of bilateral diagnosied in 2006, recurred 2011- [name]  . Chronic constipation   . Colonic polyp   . COPD (chronic obstructive pulmonary disease) ([place])   . Diabetes mellitus   . Dislocation of right elbow 2016  . HTN (hypertension) 11-2011  . Hyperlipidemia   . OSA (obstructive sleep apnea)    CPAP intolerant  . Osteopenia   . Peripheral neuropathy   . 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 Urinary retention    During massive constipation (pelvic exam, suspect 8cm or more in diameter)    Past Surgical History:  Procedure Laterality Date  . ANAL RECTAL MANOMETRY N/A 04/23/2016   Procedure: ANO RECTAL MANOMETRY;  Surgeon: [name], MD;  Location: [facility] ENDOSCOPY;  Service: Endoscopy;  Laterality: N/A;  . APPENDECTOMY     08/2004  . cyct on spine      1967  . ELBOW SURGERY Right 2016   DISLOCATION  . EXPLORATORY LAPAROTOMY    . FLEXIBLE SIGMOIDOSCOPY N/A 01/31/2013   Procedure: FLEXIBLE SIGMOIDOSCOPY;  Surgeon: [name], MD;  Location: [facility]  ENDOSCOPY;  Service: Endoscopy;  Laterality: N/A;  unprepped with sedation  . IR ANGIOGRAM PELVIS SELECTIVE OR SUPRASELECTIVE  05/22/2017  . IR ANGIOGRAM SELECTIVE EACH ADDITIONAL VESSEL  05/22/2017  . IR RADIOLOGIST EVAL & MGMT  04/23/2017  . IR RADIOLOGIST EVAL & MGMT  06/26/2017  . IR US GUIDE BX ASP/DRAIN  06/14/2017  . IR US GUIDE VASC ACCESS LEFT  05/22/2017  . lypoma removed from back-benign 2005     2005  . MASTECTOMY Bilateral 2006  . Pilonidal Cyst Surgery     ? of  . removal of boils     1970/1980    Family History  Problem Relation Age of Onset  . COPD Mother        smoker  . Hypertension Mother   . Lung cancer Father        died age 50  . Breast cancer Sister   . Diabetes Brother   . Breast cancer Maternal Grandmother   . Schizophrenia Son   . Autism Grandchild        x 2 grandsons  . Colon cancer Neg Hx   . Heart attack Neg Hx        no h/o early diseae    Social History:  reports that she quit smoking about 50 years ago. Her smoking use included cigarettes. She has a 4.00 pack-year  smoking history. She has never used smokeless tobacco. She reports that she does not drink alcohol or use drugs.  Allergies:  Allergies  Allergen Reactions  . Peanut-Containing Drug Products Swelling  . Penicillins Itching    Has patient had a PCN reaction causing immediate rash, facial/tongue/throat swelling, SOB or lightheadedness with hypotension: No Has patient had a PCN reaction causing severe rash involving mucus membranes or skin necrosis: No Has patient had a PCN reaction that required hospitalization: No Has patient had a PCN reaction occurring within the last 10 years: No If all of the above answers are "NO", then may proceed with Cephalosporin use.  "outer rectal itching"    Medications:  Current Meds  Medication Sig  . acetaminophen (TYLENOL) 650 MG CR tablet Take 650 mg by mouth every 4 (four) hours as needed for pain or fever.  Marland Kitchen albuterol (VENTOLIN HFA) 108 (90  Base) MCG/ACT inhaler INHALE 2 PUFFS INTO THE LUNGS EVERY 4 HOURS AS NEEDED FOR WHEEZING OR SHORTNESS OF BREATH (Patient taking differently: Inhale 2 puffs into the lungs every 4 (four) hours as needed for wheezing or shortness of breath. )  . AMBULATORY NON FORMULARY MEDICATION Medication Name: Nitroglycerin ointment 0.125 % 23 times daily for 4-6 weeks  Use Pea sized amount per rectum  . AMITIZA 24 MCG capsule TAKE 1 CAPSULE (24 MCG TOTAL) BY MOUTH 2 (TWO) TIMES DAILY WITH A MEAL. (Patient taking differently: Take 24 mcg by mouth 2 (two) times daily with a meal. )  . aspirin EC 81 MG tablet Take 81 mg by mouth daily.  . budesonide-formoterol (SYMBICORT) 160-4.5 MCG/ACT inhaler Inhale 2 puffs then rinse mouth, twice daily (Patient taking differently: Inhale 2 puffs into the lungs 2 (two) times daily as needed (shortness of breath). )  . calcium-vitamin D (OSCAL WITH D) 250-125 MG-UNIT tablet Take 1 tablet by mouth daily.  Marland Kitchen EPINEPHrine (EPIPEN 2-PAK) 0.3 mg/0.3 mL IJ SOAJ injection Inject 0.3 mLs (0.3 mg total) into the muscle once. (Patient taking differently: Inject 0.3 mg into the muscle daily as needed (allergic reaction). )  . fexofenadine (ALLEGRA) 180 MG tablet Take 180 mg by mouth daily.   . fluticasone-salmeterol (ADVAIR HFA) 230-21 MCG/ACT inhaler Inhale 2 puffs into the lungs 2 (two) times daily.  Marland Kitchen glucose blood (ONE TOUCH ULTRA TEST) test strip Check blood sugars daily.  . insulin lispro (HUMALOG) 100 UNIT/ML injection Inject 0-6 Units into the skin 3 (three) times daily before meals. Per sliding scale  BG < 160= 0 units 160-199=1 unit 200-239= 2 units 240-279= 3 units 280-319= 4 units 320-359= 5 units 360-399= 6 units BG > 400= 6 units and call MD  . letrozole (FEMARA) 2.5 MG tablet TAKE 1 TABLET BY MOUTH EVERY DAY (Patient taking differently: Take 2.5 mg by mouth daily. )  . magnesium hydroxide (MILK OF MAGNESIA) 400 MG/5ML suspension Take 15 mLs by mouth daily as needed for mild  constipation.  . metFORMIN (GLUCOPHAGE) 1000 MG tablet Take 1 tablet (1,000 mg total) by mouth daily.  . Multiple Vitamin (MULTIVITAMIN WITH MINERALS) TABS tablet Take 1 tablet by mouth daily.  . ONE TOUCH LANCETS MISC Use as directed once daily to check blood sugar.  DX E11.9  . simvastatin (ZOCOR) 40 MG tablet Take 1 tablet (40 mg total) by mouth at bedtime.  . traZODone (DESYREL) 50 MG tablet Take 1 tablet (50 mg total) by mouth at bedtime. (Patient taking differently: Take 50 mg by mouth at bedtime as needed for sleep. )  Results for orders placed or performed during the hospital encounter of 09/05/2018 (from the past 48 hour(s))  Basic metabolic panel     Status: Abnormal   Collection Time: 09/07/18 12:46 AM  Result Value Ref Range   Sodium 133 (L) 135 - 145 mmol/L   Potassium 3.1 (L) 3.5 - 5.1 mmol/L   Chloride 95 (L) 98 - 111 mmol/L   CO2 28 22 - 32 mmol/L   Glucose, Bld 96 70 - 99 mg/dL   BUN 13 8 - 23 mg/dL   Creatinine, Ser 0.70 0.44 - 1.00 mg/dL   Calcium 10.0 8.9 - 10.3 mg/dL   GFR calc non Af Amer >60 >60 mL/min   GFR calc Af Amer >60 >60 mL/min    Comment: (NOTE) The eGFR has been calculated using the CKD EPI equation. This calculation has not been validated in all clinical situations. eGFR's persistently <60 mL/min signify possible Chronic Kidney Disease.    Anion gap 10 5 - 15    Comment: Performed at [facility], [place] [address] [place], [place] [address]  CBC with Differential/Platelet     Status: None   Collection Time: 09/07/18 12:46 AM  Result Value Ref Range   WBC 9.0 4.0 - 10.5 K/uL   RBC 4.73 3.87 - 5.11 MIL/uL   Hemoglobin 14.5 12.0 - 15.0 g/dL   HCT 42.0 36.0 - 46.0 %   MCV 88.8 78.0 - 100.0 fL   MCH 30.7 26.0 - 34.0 pg   MCHC 34.5 30.0 - 36.0 g/dL   RDW 12.9 11.5 - 15.5 %   Platelets 277 150 - 400 K/uL   Neutrophils Relative % 78 %   Neutro Abs 7.0 1.7 - 7.7 K/uL   Lymphocytes Relative 11 %   Lymphs Abs 1.0 0.7 - 4.0 K/uL    Monocytes Relative 10 %   Monocytes Absolute 0.9 0.1 - 1.0 K/uL   Eosinophils Relative 1 %   Eosinophils Absolute 0.1 0.0 - 0.7 K/uL   Basophils Relative 0 %   Basophils Absolute 0.0 0.0 - 0.1 K/uL    Comment: Performed at [facility], [place] [address], [place], [place] [address]  Hepatic function panel     Status: Abnormal   Collection Time: 09/07/18 12:46 AM  Result Value Ref Range   Total Protein 6.5 6.5 - 8.1 g/dL   Albumin 3.9 3.5 - 5.0 g/dL   AST 23 15 - 41 U/L   ALT 17 0 - 44 U/L   Alkaline Phosphatase 37 (L) 38 - 126 U/L   Total Bilirubin 1.1 0.3 - 1.2 mg/dL   Bilirubin, Direct 0.2 0.0 - 0.2 mg/dL   Indirect Bilirubin 0.9 0.3 - 0.9 mg/dL    Comment: Performed at [facility], [place] [address], [place], [place] [address]  Lactic acid, plasma     Status: None   Collection Time: 09/07/18 12:46 AM  Result Value Ref Range   Lactic Acid, Venous 1.1 0.5 - 1.9 mmol/L    Comment: Performed at [facility], [place] [address] [place], [place] [address]  Glucose, capillary     Status: None   Collection Time: 09/07/18  4:58 AM  Result Value Ref Range   Glucose-Capillary 90 70 - 99 mg/dL  Glucose, capillary     Status: Abnormal   Collection Time: 09/07/18  7:58 AM  Result Value Ref Range   Glucose-Capillary 105 (H) 70 - 99 mg/dL    Ct Abdomen Pelvis  Wo Contrast  Result Date: 09/07/2018 CLINICAL DATA:  Severe abdominal distension. Status post barium enema September 05, 2018. History of breast cancer, appendectomy, exploratory laparotomy. EXAM: CT ABDOMEN AND PELVIS WITHOUT CONTRAST TECHNIQUE: Multidetector CT imaging of the abdomen and pelvis was performed following the standard protocol without IV contrast. COMPARISON:  Abdominal radiograph September 06, 2018 and CT abdomen and pelvis Apr 13, 2017. CT abdomen and pelvis report dated September 05, 2018 though images are not available for direct comparison. FINDINGS: LOWER CHEST:  Pleural thickening without pleural effusion. Moderate coronary artery calcification. Small pericardial effusion. Included heart size is normal. HEPATOBILIARY: Mass effect on liver due to distended bowel, otherwise negative. Dense gallbladder compatible with vicarious excretion with 1 cm gallstone. PANCREAS: Normal. SPLEEN: Normal. ADRENALS/URINARY TRACT: Kidneys are orthotopic, demonstrating normal size and morphology. Mild symmetric cortical enhancement. No nephrolithiasis, hydronephrosis; limited assessment for renal masses by nonenhanced CT. The unopacified ureters are normal in course and caliber. Urinary bladder is well distended with contrast. Thickened adrenal gland seen with hyperplasia. STOMACH/BOWEL: Contrast distended rectum with dense flocculated stool and sigmoid colon which courses on the RIGHT. Streak artifact from contrast at the rectum consistent with prior barium enema. Distended colon to 8 cm with air stool levels and large amount of retained large bowel stool. Multiple loops of redundant colon with multiple transition points. Decompressed small bowel. VASCULAR/LYMPHATIC: Aortoiliac vessels are normal in course and caliber. No lymphadenopathy by CT size criteria. REPRODUCTIVE: Obscured, potentially absent. OTHER: Small volume ascites RIGHT upper quadrant. No intraperitoneal free air. MUSCULOSKELETAL: Non-acute. Osteopenia. Moderate old T12 compression fracture. 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 Redemonstration of 2.3 cm RIGHT gluteal subcutaneous fat mass. IMPRESSION: 1. No pneumoperitoneum.  Retained contrast from recent outside CT. 2. Large amount of retained large bowel stool with colonic distension most compatible with adynamic ileus, less likely obstruction. By report this is stable from 2 days ago. Aberrant course of sigmoid colon without corroborated findings of sigmoid volvulus. 3. Cholelithiasis without CT findings of acute  cholecystitis. Electronically Signed   By: Elon Alas M.D.   On: 09/07/2018 02:37   Dg Abd Acute W/chest  Addendum Date: 09/07/2018   ADDENDUM REPORT: 09/07/2018 01:28 ADDENDUM: Acute findings discussed with and reconfirmed by Arna Snipe on 09/07/2018 at 1:25 am. Electronically Signed   By: Elon Alas M.D.   On: 09/07/2018 01:28   Result Date: 09/07/2018 CLINICAL DATA:  Abdominal pain and distension. EXAM: DG ABDOMEN ACUTE W/ 1V CHEST COMPARISON:  None. FINDINGS: Cardiomediastinal silhouette is normal. Mildly calcified aortic arch. LEFT lung base strandy densities. No pleural effusion or focal consolidation. No pneumothorax. No pneumothorax. Soft tissue planes and included osseous structures are unremarkable. Subdiaphragmatic air density. Gas distended small and large bowel difficult to discretely identify due to overlapping bowel. Dense stool distended rectum with flocculated distended stool in rectum. Soft tissue planes and included osseous structures are non suspicious. IMPRESSION: 1. No acute cardiopulmonary process. 2. Large amount of stool in rectum concerning for fecal impaction resulting in high-grade distal bowel obstruction versus severe ileus. 3. Subdiaphragmatic lucency suspicious for pneumoperitoneum, less likely gas distended bowel. Electronically Signed: By: Elon Alas M.D. On: 09/07/2018 00:46    Review of Systems  Constitutional: Negative.   HENT: Negative.   Eyes: Negative.   Respiratory: Negative.   Cardiovascular: Negative.   Gastrointestinal: Positive for constipation.       Abdominal distention   Genitourinary: Negative.   Musculoskeletal: Negative.   Skin: Negative.  Neurological: Negative.   Endo/Heme/Allergies: Negative.   Psychiatric/Behavioral: Negative.   All other systems reviewed and are negative.    Blood pressure (!) 164/83, pulse 79, temperature 97.7 F (36.5 C), temperature source Oral, resp. rate 16, weight 60.6 kg, SpO2 96  %. Physical Exam  Constitutional: She is oriented to person, place, and time. She appears well-developed and well-nourished. No distress.  Looks very fatigued/  HENT:  Head: Normocephalic and atraumatic.  Eyes: Pupils are equal, round, and reactive to light. Conjunctivae are normal. No scleral icterus.  Neck: Normal range of motion. Neck supple. No tracheal deviation present. No thyromegaly present.  Cardiovascular: Normal rate, regular rhythm and intact distal pulses.  Respiratory: Effort normal. No respiratory distress. She exhibits no tenderness.  GI: Soft. She exhibits distension. She exhibits no mass. There is no tenderness. There is no rebound and no guarding.  Neurological: She is alert and oriented to person, place, and time.  Skin: Skin is warm and dry. No rash noted. She is not diaphoretic. No erythema. No pallor.  Psychiatric: She has a normal mood and affect. Her behavior is normal.  A little confused.    Assessment/Plan: Adynamic colonic ileus.  Recommend GI consult. No evidence of obstruction or pneumoperitoneum Recommend keeping mag and K repleted.   Agree with minimizing medications.    Stark Klein 09/07/2018, 11:25 AM

## 2018-09-07 NOTE — Plan of Care (Addendum)
Patient admitted this morning as a transfer from [facility] with possible ileus pneumoperitoneum.CT abdomen-No pneumoperitoneum.  Retained contrast from recent outside CT.  Large amount of retained large bowel stool with colonic distension most compatible with adynamic ileus, less likely obstruction. By report this is stable from 2 days ago. Aberrant course of sigmoid colon without corroborated findings of sigmoid volvulus.  Cholelithiasis without CT findings of acute cholecystitis  Await surgery input.  DW Daughter

## 2018-09-08 ENCOUNTER — Inpatient Hospital Stay (HOSPITAL_COMMUNITY)
Admission: AD | Admit: 2018-09-08 | Discharge: 2018-09-08 | Disposition: A | Discharge status: Home / Self Care | Payer: Medicare Other | Source: Other Acute Inpatient Hospital | Attending: Internal Medicine | Admitting: Internal Medicine

## 2018-09-08 ENCOUNTER — Inpatient Hospital Stay (HOSPITAL_COMMUNITY): Payer: Medicare Other

## 2018-09-08 DIAGNOSIS — G4733 Obstructive sleep apnea (adult) (pediatric): Secondary | ICD-10-CM

## 2018-09-08 DIAGNOSIS — R4182 Altered mental status, unspecified: Secondary | ICD-10-CM

## 2018-09-08 DIAGNOSIS — R140 Abdominal distension (gaseous): Secondary | ICD-10-CM

## 2018-09-08 DIAGNOSIS — K5641 Fecal impaction: Secondary | ICD-10-CM

## 2018-09-08 DIAGNOSIS — J4541 Moderate persistent asthma with (acute) exacerbation: Secondary | ICD-10-CM

## 2018-09-08 DIAGNOSIS — R109 Unspecified abdominal pain: Secondary | ICD-10-CM

## 2018-09-08 DIAGNOSIS — Z853 Personal history of malignant neoplasm of breast: Secondary | ICD-10-CM

## 2018-09-08 DIAGNOSIS — G9340 Encephalopathy, unspecified: Secondary | ICD-10-CM

## 2018-09-08 DIAGNOSIS — F28 Other psychotic disorder not due to a substance or known physiological condition: Secondary | ICD-10-CM

## 2018-09-08 DIAGNOSIS — K5902 Outlet dysfunction constipation: Secondary | ICD-10-CM

## 2018-09-08 DIAGNOSIS — K5904 Chronic idiopathic constipation: Secondary | ICD-10-CM

## 2018-09-08 DIAGNOSIS — R1084 Generalized abdominal pain: Secondary | ICD-10-CM

## 2018-09-08 LAB — GLUCOSE, CAPILLARY
Glucose-Capillary: 101 mg/dL — ABNORMAL HIGH (ref 70–99)
Glucose-Capillary: 103 mg/dL — ABNORMAL HIGH (ref 70–99)
Glucose-Capillary: 104 mg/dL — ABNORMAL HIGH (ref 70–99)
Glucose-Capillary: 112 mg/dL — ABNORMAL HIGH (ref 70–99)
Glucose-Capillary: 114 mg/dL — ABNORMAL HIGH (ref 70–99)
Glucose-Capillary: 125 mg/dL — ABNORMAL HIGH (ref 70–99)
Glucose-Capillary: 129 mg/dL — ABNORMAL HIGH (ref 70–99)
Glucose-Capillary: 247 mg/dL — ABNORMAL HIGH (ref 70–99)
Glucose-Capillary: 306 mg/dL — ABNORMAL HIGH (ref 70–99)

## 2018-09-08 LAB — CBC
HCT: 38.2 % (ref 36.0–46.0)
HEMOGLOBIN: 13.3 g/dL (ref 12.0–15.0)
MCH: 30.9 pg (ref 26.0–34.0)
MCHC: 34.8 g/dL (ref 30.0–36.0)
MCV: 88.8 fL (ref 78.0–100.0)
Platelets: 312 10*3/uL (ref 150–400)
RBC: 4.3 MIL/uL (ref 3.87–5.11)
RDW: 13.1 % (ref 11.5–15.5)
WBC: 8 10*3/uL (ref 4.0–10.5)

## 2018-09-08 LAB — COMPREHENSIVE METABOLIC PANEL
ALT: 14 U/L (ref 0–44)
AST: 17 U/L (ref 15–41)
Albumin: 3.5 g/dL (ref 3.5–5.0)
Alkaline Phosphatase: 33 U/L — ABNORMAL LOW (ref 38–126)
Anion gap: 7 (ref 5–15)
BILIRUBIN TOTAL: 0.9 mg/dL (ref 0.3–1.2)
BUN: 13 mg/dL (ref 8–23)
CALCIUM: 9.6 mg/dL (ref 8.9–10.3)
CO2: 26 mmol/L (ref 22–32)
CREATININE: 0.67 mg/dL (ref 0.44–1.00)
Chloride: 104 mmol/L (ref 98–111)
Glucose, Bld: 102 mg/dL — ABNORMAL HIGH (ref 70–99)
Potassium: 3.3 mmol/L — ABNORMAL LOW (ref 3.5–5.1)
Sodium: 137 mmol/L (ref 135–145)
Total Protein: 6 g/dL — ABNORMAL LOW (ref 6.5–8.1)

## 2018-09-08 LAB — LACTIC ACID, PLASMA: Lactic Acid, Venous: 0.9 mmol/L (ref 0.5–1.9)

## 2018-09-08 LAB — TROPONIN I: Troponin I: <0.03 ng/mL (ref <0.03)

## 2018-09-08 MED ORDER — DEXTROSE-NACL 5-0.45 % IV SOLN
INTRAVENOUS | Status: DC
  Administered 2018-09-08: 16:00:00 via INTRAVENOUS

## 2018-09-08 MED ORDER — VANCOMYCIN HCL 10 G IV SOLR
1250.0000 mg | INTRAVENOUS | Status: AC
  Administered 2018-09-08: 1250 mg via INTRAVENOUS
  Filled 2018-09-08: qty 1250

## 2018-09-08 MED ORDER — SORBITOL 70 % SOLN
960.0000 mL | TOPICAL_OIL | Freq: Once | ORAL | Status: AC
  Administered 2018-09-08: 960 mL via RECTAL
  Filled 2018-09-08: qty 473

## 2018-09-08 MED ORDER — MORPHINE SULFATE (PF) 2 MG/ML IV SOLN: 2.0000 mg | Freq: Once | INTRAVENOUS | Status: DC

## 2018-09-08 MED ORDER — LABETALOL HCL 5 MG/ML IV SOLN
10.0000 mg | INTRAVENOUS | Status: DC | PRN
  Filled 2018-09-08 (×2): qty 4

## 2018-09-08 MED ORDER — VANCOMYCIN HCL IN DEXTROSE 1-5 GM/200ML-% IV SOLN: 1000.0000 mg | INTRAVENOUS | Status: DC

## 2018-09-08 MED ORDER — PIPERACILLIN-TAZOBACTAM 3.375 G IVPB
3.3750 g | Freq: Three times a day (TID) | INTRAVENOUS | Status: DC
  Administered 2018-09-08 – 2018-09-09 (×2): 3.375 g via INTRAVENOUS
  Filled 2018-09-08 (×2): qty 50

## 2018-09-08 MED ORDER — ACETAMINOPHEN 10 MG/ML IV SOLN
1000.0000 mg | Freq: Once | INTRAVENOUS | Status: AC
  Administered 2018-09-08: 1000 mg via INTRAVENOUS
  Filled 2018-09-08: qty 100

## 2018-09-08 NOTE — Progress Notes (Signed)
EEG complete - results pending 

## 2018-09-08 NOTE — Consult Note (Signed)
Neurology Consultation Reason for Consult: Altered mental status Referring Physician: [place] Crane, C  CC: Altered mental status  History is obtained from: Patient  HPI: [name] is a 77 y.o. female with a history of diabetes, hypertension, hyperlipidemia, breast cancer who presents with progressive confusion in the setting of initial UTI and subsequently  Severe constipation.  Her daughter states that she has not had any concerns about her memory up until 3 weeks ago at which point she began having progressive confusion and paranoia.  She was evaluated on 9/15 with concern for suicidal ideation who recommended inpatient psychiatric work-up.  She was then admitted to [facility] on September 27 with complaints of abdominal distention, she had been in behavioral health in the intervening timeframe.  She was followed by gastroenterology and surgery, but family wanted to be transferred to [facility] and therefore that was undertaken.  She has continued to be confused but interactive, but over the course of the day has become less interactive and therefore neurology was consulted.    ROS:  Unable to obtain due to altered mental status.   Past Medical History:  Diagnosis Date  . Allergic rhinitis   . Anal fissure   . Asthma    dx in adulthood (77 y/o) PFT 09/08/09 FEV1 1.70/81%; FEV1/FVC 0.75; +resp to dilartor; NI LV/DLCO  . Breast CA ([place])    hx of bilateral diagnosied in 2006, recurred 2011- [name]  . Chronic constipation   . Colonic polyp   . COPD (chronic obstructive pulmonary disease) ([place])   . Diabetes mellitus   . Dislocation of right elbow 2016  . HTN (hypertension) 11-2011  . Hyperlipidemia   . OSA (obstructive sleep apnea)    CPAP intolerant  . Osteopenia   . Peripheral neuropathy   . 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 Urinary retention    During massive constipation (pelvic exam, suspect 8cm or more in diameter)     Family History   Problem Relation Age of Onset  . COPD Mother        smoker  . Hypertension Mother   . Lung cancer Father        died age 76  . Breast cancer Sister   . Diabetes Brother   . Breast cancer Maternal Grandmother   . Schizophrenia Son   . Autism Grandchild        x 2 grandsons  . Colon cancer Neg Hx   . Heart attack Neg Hx        no h/o early diseae     Social History:  reports that she quit smoking about 50 years ago. Her smoking use included cigarettes. She has a 4.00 pack-year smoking history. She has never used smokeless tobacco. She reports that she does not drink alcohol or use drugs.   Exam: Current vital signs: BP (!) 146/101 (BP Location: Right Arm)   Pulse (!) 106   Temp 99.2 F (37.3 C) (Oral)   Resp 15   Ht 5\' 3"  (1.6 m)   Wt 60.5 kg   SpO2 95%   BMI 23.63 kg/m  Vital signs in last 24 hours: Temp:  [97.5 F (36.4 C)-99.2 F (37.3 C)] 99.2 F (37.3 C) (09/30 1421) Pulse Rate:  [79-106] 106 (09/30 1634) Resp:  [15-18] 15 (09/30 1421) BP: (118-182)/(78-114) 146/101 (09/30 1634) SpO2:  [95 %-100 %] 95 % (09/30 1421)   Physical Exam  Constitutional: Appears well-developed and well-nourished.  Psych: Appears slightly agitated Eyes: No  scleral injection HENT: No OP obstrucion Head: Normocephalic.  Cardiovascular: Normal rate and regular rhythm.  Respiratory: Effort normal, non-labored breathing GI: Soft.  No distension. There is no tenderness.  Skin: WDI  Neuro: Mental Status: Patient opens eyes, when asked her name she answers "I already know my name, I do not have to answer that" her answers seem tangential and not directly related to the questions that I ask. Cranial Nerves: II: She does not clearly blink to threat from either direction, pupils are equal reactive bilaterally. III,IV, VI: She does not fixate or track to test smooth pursuit, but with doll's eye maneuver her eyes cross midline in both directions V: Facial sensation is symmetric to  temperature VII: Facial movement is symmetric.  VIII: hearing is intact to voice X: Uvula elevates symmetrically XI: Shoulder shrug is symmetric. XII: tongue is midline without atrophy or fasciculations.  Motor: She has significant paratonia with any manipulation of any limb.  She moves all extremities voluntarily, but does not participate in formal strength testing Sensory: Sensation is intact in all 4 extremities Cerebellar: She does not perform.   I have reviewed labs in epic and the results pertinent to this consultation are: CMP-unremarkable  I have reviewed the images obtained: MRI brain-no acute findings  Impression: 77 year old female with progressive encephalopathy in the setting of ileus/recent UTI.  Possibilities include delirium secondary to unmasking of previously unrecognized dementia the setting of infection, seizures, psychiatric catatonia, or less common causes such as autoimmune encephalopathy etc. With normal imaging I would favor ruling out frontal status with EEG, if this is negative, would consider treating GI issues and if her mental status remains impaired despite treating this, may need to consider more extensive workup(e.g. Autoimmune causes, these tests take several weeks to come back typically).   Recommendations: 1) EEG  2) treat colonic obstruction 3) consider LP for cells, glucose, protein, IgG index, save sample for further testing if needed.  4) will follow.     Roland Rack, MD [facility] [phone]  If 7pm- 7am, please page neurology on call as listed in [place].

## 2018-09-08 NOTE — Progress Notes (Addendum)
PROGRESS NOTE  [name]:[id number] DOB: May 06, 1941 DOA: 08/23/2018 PCP: Colon Branch, MD  HPI/Recap of past 24 hours: [name] is a 77 y.o. female with history of diabetes mellitus type 2, hypertension, COPD, breast cancer in remission who was admitted to [facility] on September 27 with complaint of abdominal distention.  Was brought in from behavioral health.  CT scan abdomen and pelvis without contrast showed mild gaseous distention of the majority of the colon with sigmoid displacement of the right upper quadrant.  There was large amount of stool within the descending and transverse colon this is favored to represent sequela from adynamic ileus and/or constipation.  Initially surgery was consulted and followed by gastroenterology who gave patient smog enema and also place patient on MiraLAX.  Planning decompression based on the results.  But patient's family wanted patient to be transferred to [facility].  Acute abdomen shows possible pneumoperitoneum with possible obstruction versus ileus.  09/08/2018: Patient seen and examined with her daughter at her bedside.  Per her daughter the patient has no prior history of dementia or psychosis.  Mental decline occurred less than 4 weeks ago.  With onset around the time when she was diagnosed with UTI.  Patient is alert but minimally interactive.  Per her daughter this is not her baseline.  At her baseline she is conversational and rational.  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   Assessment/Plan: Active Problems:   DM II (diabetes mellitus, type II), controlled (HCC)   Obstructive sleep apnea   Moderate persistent asthmatic bronchitis with acute exacerbation   BREAST CANCER, HX OF   Hypertension   Ileus (HCC)   Psychosis (HCC)  Adynamic ileus versus obstruction CT abdomen done on 09/07/2017 revealed no pneumoperitoneum with large amount of retained large bowel stool with colonic  distention GI consulted to further assess Might need a colonoscopy to rule out a mass effect  SIRS with no clear source of infection Tmax 101.2, HR 104, and RR 28 later this afternoon Blood cultures x 2 ordered stat Lactic acid ordered earlier today 0.8 IV vancomycin and IV zosyn started empirically MRSA screening ordered, if negative may DC IV vancomycin  Chronic constipation Per patient's daughter patient has not had a bowel movement in more than 3 weeks Received enemas unsuccessfully  Acute metabolic encephalopathy, unclear etiology Per patient's daughter her baseline is conversational and rational with no prior history of dementia 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  All questions answered to her satisfaction.  Disposition Plan: SNF versus home when clinically stable.   Consultants:  GI  Procedures:  None  Antimicrobials:  None  DVT prophylaxis: SCDs; subcu Lovenox daily  Objective: Vitals:   09/07/18 1455 09/07/18 2056 09/08/18 0533 09/08/18 0904  BP: 139/85 118/87 128/78   Pulse: 93 89 79   Resp: 16 18 16    Temp: (!) 97.5 F (36.4 C) (!) 97.5 F (36.4 C) 98.1 F (36.7 C)   TempSrc: Axillary Oral Oral   SpO2: 100% 99% 100% 98%  Weight:      Height:  Intake/Output Summary (Last 24 hours) at 09/08/2018 1304 Last data filed at 09/08/2018 4742 Gross per 24 hour  Intake 994.44 ml  Output 400 ml  Net 594.44 ml   Filed Weights   09/05/2018 2253 09/07/18 1100  Weight: 60.6 kg 60.5 kg    Exam:  . General: 77 y.o. year-old female well developed well nourished in no acute distress.  Alert and minimally  interactive. . Cardiovascular: Regular rate and rhythm with no rubs or gallops.  No thyromegaly or JVD noted.   Marland Kitchen Respiratory: Clear to auscultation with no wheezes or rales. Good inspiratory effort. . Abdomen: Severely distended, nontender, abnormal bowel sounds. . Musculoskeletal: No lower extremity edema. 2/4 pulses in all 4 extremities. Marland Kitchen Psychiatry: Unable to assess mood due to altered mental status.   Data Reviewed: CBC: Recent Labs  Lab 09/07/18 0046 09/08/18 0349  WBC 9.0 8.0  NEUTROABS 7.0  --   HGB 14.5 13.3  HCT 42.0 38.2  MCV 88.8 88.8  PLT 277 595   Basic Metabolic Panel: Recent Labs  Lab 09/07/18 0046 09/08/18 0349  NA 133* 137  K 3.1* 3.3*  CL 95* 104  CO2 28 26  GLUCOSE 96 102*  BUN 13 13  CREATININE 0.70 0.67  CALCIUM 10.0 9.6   GFR: Estimated Creatinine Clearance: 49.5 mL/min (by C-G formula based on SCr of 0.67 mg/dL). Liver Function Tests: Recent Labs  Lab 09/07/18 0046 09/08/18 0349  AST 23 17  ALT 17 14  ALKPHOS 37* 33*  BILITOT 1.1 0.9  PROT 6.5 6.0*  ALBUMIN 3.9 3.5   No results for input(s): LIPASE, AMYLASE in the last 168 hours. No results for input(s): AMMONIA in the last 168 hours. Coagulation Profile: No results for input(s): INR, PROTIME in the last 168 hours. Cardiac Enzymes: No results for input(s): CKTOTAL, CKMB, CKMBINDEX, TROPONINI in the last 168 hours. BNP (last 3 results) No results for input(s): PROBNP in the last 8760 hours. HbA1C: No results for input(s): HGBA1C in the last 72 hours. CBG: Recent Labs  Lab 09/07/18 1630 09/07/18 1957 09/07/18 2358 09/08/18 0354 09/08/18 0743  GLUCAP 132* 114* 125* 112* 104*   Lipid Profile: No results for input(s): CHOL, HDL, LDLCALC, TRIG, CHOLHDL, LDLDIRECT in the last 72 hours. Thyroid Function Tests: No results for input(s): TSH, T4TOTAL, FREET4, T3FREE, THYROIDAB in the last 72 hours. Anemia Panel: No results for input(s): VITAMINB12, FOLATE, FERRITIN, TIBC, IRON,  RETICCTPCT in the last 72 hours. Urine analysis:    Component Value Date/Time   COLORURINE YELLOW 08/25/2018 0020   APPEARANCEUR CLEAR 08/25/2018 0020   LABSPEC 1.008 08/25/2018 0020   PHURINE 5.0 08/25/2018 0020   GLUCOSEU NEGATIVE 08/25/2018 0020   GLUCOSEU NEGATIVE 08/22/2015 1620   HGBUR MODERATE (A) 08/25/2018 0020   BILIRUBINUR NEGATIVE 08/25/2018 0020   BILIRUBINUR Negative 08/22/2015 1502   KETONESUR NEGATIVE 08/25/2018 0020   PROTEINUR NEGATIVE 08/25/2018 0020   UROBILINOGEN 0.2 08/24/2018 1455   NITRITE NEGATIVE 08/25/2018 0020   LEUKOCYTESUR SMALL (A) 08/25/2018 0020   Sepsis Labs: @LABRCNTIP (procalcitonin:4,lacticidven:4)  )No results found for this or any previous visit (from the past 240 hour(s)).    Studies: Mr Brain Wo Contrast  Result Date: 09/08/2018 CLINICAL DATA:  Dementia with psychosis. Worsening encephalopathy. Urinary tract infection. EXAM: MRI HEAD WITHOUT CONTRAST TECHNIQUE: Multiplanar, multiecho pulse sequences of the brain and surrounding structures were obtained without intravenous contrast. COMPARISON:  08/25/2018. FINDINGS: Brain: Generalized atrophy. Moderate white matter T2 and FLAIR hyperintensities, consistent with chronic microvascular ischemic change.  No acute stroke, acute hemorrhage, mass lesion, or extra-axial fluid. Hydrocephalus ex vacuo. Vascular: Normal flow voids. Skull and upper cervical spine: Normal marrow signal. Sinuses/Orbits: Negative. Other: Stable MR appearance from 08/25/2018. IMPRESSION: Stable exam. Atrophy and small vessel disease similar to priors. No acute intracranial findings. Electronically Signed   By: Staci Righter M.D.   On: 09/08/2018 12:09    Scheduled Meds: . budesonide (PULMICORT) nebulizer solution  0.25 mg Nebulization BID  . Influenza vac split quadrivalent PF  0.5 mL Intramuscular Tomorrow-1000  . insulin aspart  0-9 Units Subcutaneous Q4H  . ipratropium-albuterol  3 mL Nebulization BID  . sorbitol, milk of  mag, mineral oil, glycerin (SMOG) enema  960 mL Rectal Once    Continuous Infusions:   LOS: 2 days     Kayleen Memos, MD [facility] Pager [phone]  If 7PM-7AM, please contact night-coverage [email or web address] Password [email or web address] 09/08/2018, 1:04 PM

## 2018-09-08 NOTE — Procedures (Signed)
ELECTROENCEPHALOGRAM REPORT   Patient: [name]       Room #: 6962 EEG No. ID: [id number] Age: 77 y.o.        Sex: female Referring Physician: [place] Crane Report Date:  09/08/2018        Interpreting Physician: Alexis Goodell  History: [name] is an 77 y.o. female with altered mental status  Medications:  Insulin, Vancomycin  Conditions of Recording:  This is a 21 channel routine scalp EEG performed with bipolar and monopolar montages arranged in accordance to the international 10/20 system of electrode placement. One channel was dedicated to EKG recording.  The patient is in the unresponsive state.  Description:  Muscle and movement artifact are prominent throughout the recording, particularly in the frontal regions bilaterally.  This artifact often obscures the background rhythm.  When able to be evaluated the background rhythm is slow and poorly organized.  The most dominant rhythm was 5-6 Hz.  Overlying beta activity is noted as well.  This activity is diffusely distributed and contnuous throughout the recording.  No epileptiform activity is noted.   Hyperventilation and intermittent photic stimulation were not performed.   IMPRESSION: This is an abnormal EEG secondary to general background slowing.  This finding may be seen with a diffuse disturbance that is etiologically nonspecific, but may include a metabolic encephalopathy, among other possibilities.  No epileptiform activity was noted.     COMMENT: The muscle and movement artifact present may obscure more subtle findings.   Alexis Goodell, MD Neurology [phone] 09/08/2018, 6:37 PM

## 2018-09-08 NOTE — Consult Note (Addendum)
Referring Provider: [facility]   Primary Care Physician:  Colon Branch, MD Primary Gastroenterologist: Harl Bowie, MD  Reason for Consultation:  Abdominal distention.     ASSESSMENT AND PLAN:    77 yo female with chronic constipation admitted with abdominal distention and large stool burden with ? Impaction. Barium enema a [facility] two days ago raised concern for obstruction. While not excluded, patient has a history of fecal impactions.  -Abdominal series this admission raised concern for pneumoperitoneum and also possibly obstruction.  Patient has been seen by Surgery who does not feel that either is the case -will give a smog enema.  It appears she had one at [facility] with little output but sometimes it does take more than one.  If we can get adequate return with enema then will also add p.o. meds for constipation   Encephalopathy x 3 weeks. Originally thought to be from UTI. Ammonia level normal earlier this month (done in ED for altered mental status). Head CT scan negative.   HPI: [name] is a 77 y.o. female with chronic constipation with fecal impaction, COPD, DM, and HTN.  Patient was transferred from behavioral health to admitted to [facility] on 9/27 for complaints of abdominal distention.  CT scan showed marked gaseous distention of the majority of the colon displacement of the sigmoid within the RLQ .  Large amount of stool in the descending and transverse colon.  Patient was apparently seen by Surgery and GI.  Barium enema suggested possible colonic obstruction in the region of the rectosigmoid junction.  It was not clear if poor barium passage was related to the large amount of stool versus mass.   She was given a smog enema and placed on MiraLAX.  Patient's family wanted her transferred to [facility] and arrived yesterday.  Abdominal series this admission showed large amount of fecal material in rectum / possible pneumoperitoneum with possible obstruction  versus ileus.  Surgery has seen and doesn't feel there is any evidence for obstruction or pneumoperitoneum.   Patient is confused, history comes from chart. We have unsuccessfully ried to prep patient for outpatient colonoscopy, she hasn't had one in years. She has tried various agents for constipation, currently on Amitiza at home.  We have been working to arrange defecography further evaluation.    CBC is normal. Serum chemistries unremarkable.     Past Medical History:  Diagnosis Date  . Allergic rhinitis   . Anal fissure   . Asthma    dx in adulthood (77 y/o) PFT 09/08/09 FEV1 1.70/81%; FEV1/FVC 0.75; +resp to dilartor; NI LV/DLCO  . Breast CA ([place])    hx of bilateral diagnosied in 2006, recurred 2011- [name]  . Chronic constipation   . Colonic polyp   . COPD (chronic obstructive pulmonary disease) ([place])   . Diabetes mellitus   . Dislocation of right elbow 2016  . HTN (hypertension) 11-2011  . Hyperlipidemia   . OSA (obstructive sleep apnea)    CPAP intolerant  . Osteopenia   . Peripheral neuropathy   . 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 Urinary retention    During massive constipation (pelvic exam, suspect 8cm or more in diameter)    Past Surgical History:  Procedure Laterality Date  . ANAL RECTAL MANOMETRY N/A 04/23/2016   Procedure: ANO RECTAL MANOMETRY;  Surgeon: [name], MD;  Location: [facility] ENDOSCOPY;  Service: Endoscopy;  Laterality: N/A;  . APPENDECTOMY     08/2004  . cyct on  spine      1967  . ELBOW SURGERY Right 2016   DISLOCATION  . EXPLORATORY LAPAROTOMY    . FLEXIBLE SIGMOIDOSCOPY N/A 01/31/2013   Procedure: FLEXIBLE SIGMOIDOSCOPY;  Surgeon: [name], MD;  Location: [facility] ENDOSCOPY;  Service: Endoscopy;  Laterality: N/A;  unprepped with sedation  . IR ANGIOGRAM PELVIS SELECTIVE OR SUPRASELECTIVE  05/22/2017  . IR ANGIOGRAM SELECTIVE EACH ADDITIONAL VESSEL  05/22/2017  . IR RADIOLOGIST EVAL & MGMT  04/23/2017  . IR  RADIOLOGIST EVAL & MGMT  06/26/2017  . IR US GUIDE BX ASP/DRAIN  06/14/2017  . IR US GUIDE VASC ACCESS LEFT  05/22/2017  . lypoma removed from back-benign 2005     2005  . MASTECTOMY Bilateral 2006  . Pilonidal Cyst Surgery     ? of  . removal of boils     1970/1980    Prior to Admission medications   Medication Sig Start Date End Date Taking? Authorizing Provider  acetaminophen (TYLENOL) 650 MG CR tablet Take 650 mg by mouth every 4 (four) hours as needed for pain or fever.   Yes [provider]  albuterol (VENTOLIN HFA) 108 (90 Base) MCG/ACT inhaler INHALE 2 PUFFS INTO THE LUNGS EVERY 4 HOURS AS NEEDED FOR WHEEZING OR SHORTNESS OF BREATH Patient taking differently: Inhale 2 puffs into the lungs every 4 (four) hours as needed for wheezing or shortness of breath.  06/09/18  Yes Young, Tarri Fuller D, MD  AMBULATORY NON FORMULARY MEDICATION Medication Name: Nitroglycerin ointment 0.125 % 23 times daily for 4-6 weeks  Use Pea sized amount per rectum 07/01/18  Yes Cardarius Senat, Venia Minks, MD  AMITIZA 24 MCG capsule TAKE 1 CAPSULE (24 MCG TOTAL) BY MOUTH 2 (TWO) TIMES DAILY WITH A MEAL. Patient taking differently: Take 24 mcg by mouth 2 (two) times daily with a meal.  08/18/18  Yes Roderica Cathell, Venia Minks, MD  aspirin EC 81 MG tablet Take 81 mg by mouth daily.   Yes [provider]  budesonide-formoterol (SYMBICORT) 160-4.5 MCG/ACT inhaler Inhale 2 puffs then rinse mouth, twice daily Patient taking differently: Inhale 2 puffs into the lungs 2 (two) times daily as needed (shortness of breath).  06/09/18  Yes Young, Tarri Fuller D, MD  calcium-vitamin D (OSCAL WITH D) 250-125 MG-UNIT tablet Take 1 tablet by mouth daily.   Yes [provider]  EPINEPHrine (EPIPEN 2-PAK) 0.3 mg/0.3 mL IJ SOAJ injection Inject 0.3 mLs (0.3 mg total) into the muscle once. Patient taking differently: Inject 0.3 mg into the muscle daily as needed (allergic reaction).  04/11/15  Yes Paz, Alda Berthold, MD  fexofenadine (ALLEGRA)  180 MG tablet Take 180 mg by mouth daily.    Yes [provider]  fluticasone-salmeterol (ADVAIR HFA) 230-21 MCG/ACT inhaler Inhale 2 puffs into the lungs 2 (two) times daily.   Yes [provider]  glucose blood (ONE TOUCH ULTRA TEST) test strip Check blood sugars daily. 05/24/17  Yes Paz, Alda Berthold, MD  insulin lispro (HUMALOG) 100 UNIT/ML injection Inject 0-6 Units into the skin 3 (three) times daily before meals. Per sliding scale  BG < 160= 0 units 160-199=1 unit 200-239= 2 units 240-279= 3 units 280-319= 4 units 320-359= 5 units 360-399= 6 units BG > 400= 6 units and call MD   Yes [provider]  letrozole ([place]) 2.5 MG tablet TAKE 1 TABLET BY MOUTH EVERY DAY Patient taking differently: Take 2.5 mg by mouth daily.  08/01/18  Yes Alla Feeling, NP  magnesium hydroxide (  MILK OF MAGNESIA) 400 MG/5ML suspension Take 15 mLs by mouth daily as needed for mild constipation.   Yes [provider]  metFORMIN (GLUCOPHAGE) 1000 MG tablet Take 1 tablet (1,000 mg total) by mouth daily. 12/25/17  Yes Paz, Alda Berthold, MD  Multiple Vitamin (MULTIVITAMIN WITH MINERALS) TABS tablet Take 1 tablet by mouth daily.   Yes [provider]  ONE TOUCH LANCETS MISC Use as directed once daily to check blood sugar.  DX E11.9 04/12/16  Yes Paz, Alda Berthold, MD  simvastatin (ZOCOR) 40 MG tablet Take 1 tablet (40 mg total) by mouth at bedtime. 05/01/18  Yes Paz, Alda Berthold, MD  traZODone (DESYREL) 50 MG tablet Take 1 tablet (50 mg total) by mouth at bedtime. Patient taking differently: Take 50 mg by mouth at bedtime as needed for sleep.  06/09/18  Yes Young, Clinton D, MD  AMITIZA 8 MCG capsule TAKE 1 CAPSULE (8 MCG TOTAL) BY MOUTH 2 (TWO) TIMES DAILY WITH A MEAL. Patient not taking: Reported on 08/11/2018 08/25/18   Mauri Pole, MD  escitalopram (LEXAPRO) 10 MG tablet Take 1 tablet (10 mg total) by mouth daily. 08/14/18   Colon Branch, MD  lactulose (CHRONULAC) 10 GM/15ML solution Take 15  mLs (10 g total) by mouth 3 (three) times daily as needed. Patient not taking: Reported on 08/25/2018 08/07/18   Colon Branch, MD  triamterene-hydrochlorothiazide (MAXZIDE-25) 37.5-25 MG tablet Take 1 tablet by mouth daily. Patient not taking: Reported on 08/30/2018 04/02/18   Colon Branch, MD    Current Facility-Administered Medications  Medication Dose Route Frequency Provider Last Rate Last Dose  . acetaminophen (TYLENOL) tablet 650 mg  650 mg Oral Q6H PRN Rise Patience, MD       Or  . acetaminophen (TYLENOL) suppository 650 mg  650 mg Rectal Q6H PRN Rise Patience, MD      . albuterol (PROVENTIL) (2.5 MG/3ML) 0.083% nebulizer solution 2.5 mg  2.5 mg Nebulization Q2H PRN Rise Patience, MD      . budesonide (PULMICORT) nebulizer solution 0.25 mg  0.25 mg Nebulization BID Rise Patience, MD   0.25 mg at 09/08/18 0904  . Influenza vac split quadrivalent PF (FLUZONE HIGH-DOSE) injection 0.5 mL  0.5 mL Intramuscular Tomorrow-1000 Georgette Shell, MD      . insulin aspart (novoLOG) injection 0-9 Units  0-9 Units Subcutaneous Q4H Rise Patience, MD   1 Units at 09/08/18 0025  . ipratropium-albuterol (DUONEB) 0.5-2.5 (3) MG/3ML nebulizer solution 3 mL  3 mL Nebulization BID Georgette Shell, MD   3 mL at 09/08/18 0904  . ondansetron (ZOFRAN) tablet 4 mg  4 mg Oral Q6H PRN Rise Patience, MD       Or  . ondansetron [facility]) injection 4 mg  4 mg Intravenous Q6H PRN Rise Patience, MD        Allergies as of 08/11/2018 - Review Complete 09/05/2018  Allergen Reaction Noted  . Peanut-containing drug products Swelling 06/09/2012  . Penicillins Itching 08/22/2005    Family History  Problem Relation Age of Onset  . COPD Mother        smoker  . Hypertension Mother   . Lung cancer Father        died age 41  . Breast cancer Sister   . Diabetes Brother   . Breast cancer Maternal Grandmother   . Schizophrenia Son   . Autism Grandchild        x  2  grandsons  . Colon cancer Neg Hx   . Heart attack Neg Hx        no h/o early diseae    Social History   Socioeconomic History  . Marital status: Widowed    Spouse name: Not on file  . Number of children: 2  . Years of education: Not on file  . Highest education level: Not on file  Occupational History  . Occupation: retired    Fish farm manager: RETIRED  Social Needs  . Financial resource strain: Not on file  . Food insecurity:    Worry: Not on file    Inability: Not on file  . Transportation needs:    Medical: Not on file    Non-medical: Not on file  Tobacco Use  . Smoking status: Former Smoker    Packs/day: 1.00    Years: 4.00    Pack years: 4.00    Types: Cigarettes    Last attempt to quit: 04/01/1968    Years since quitting: 50.4  . Smokeless tobacco: Never Used  . Tobacco comment: Quit at age 62  Substance and Sexual Activity  . Alcohol use: No  . Drug use: No  . Sexual activity: Not Currently  Lifestyle  . Physical activity:    Days per week: Not on file    Minutes per session: Not on file  . Stress: Not on file  Relationships  . Social connections:    Talks on phone: Not on file    Gets together: Not on file    Attends religious service: Not on file    Active member of club or organization: Not on file    Attends meetings of clubs or organizations: Not on file    Relationship status: Not on file  . Intimate partner violence:    Fear of current or ex partner: Not on file    Emotionally abused: Not on file    Physically abused: Not on file    Forced sexual activity: Not on file  Other Topics Concern  . 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 husband 4-10, her disable child lives w/ her, he does most of physical work at home    Does drive, plays cards, manage her finances, cook   retired Pharmacist, hospital   2 children    [place] twins      Review of Systems: All systems reviewed and negative except where noted in HPI.  Physical Exam: Vital signs in last  24 hours: Temp:  [97.5 F (36.4 C)-98.1 F (36.7 C)] 98.1 F (36.7 C) (09/30 0533) Pulse Rate:  [79-93] 79 (09/30 0533) Resp:  [16-18] 16 (09/30 0533) BP: (118-139)/(78-87) 128/78 (09/30 0533) SpO2:  [98 %-100 %] 98 % (09/30 0904) Last BM Date: (unsure) General:   Alert, well-developed, female in NAD Psych:  Unable to assess. She is cooperative. Flat affect Eyes:  Pupils equal, sclera clear, no icterus.   Conjunctiva pink. Ears:  Normal auditory acuity. Nose:  No deformity, discharge,  or lesions. Neck:  Supple; no masses Lungs:  Clear throughout to auscultation.   No wheezes, crackles, or rhonchi.  Heart:  Regular rate and rhythm; no murmurs, no edema Abdomen:  Soft, moderate to largely distended, nontender. Abnormal bowel sounds (hyperactive, "metallic")    Rectal:  No stool in vault. Pressurized area- expel a lot of gas with exam  Msk:  Symmetrical without gross deformities. . Neurologic:  confused Skin:  Intact without significant lesions or rashes.Marland Kitchen  Intake/Output from previous day: 09/29 0701 - 09/30 0700 In: 1234.8 [I.V.:933.5; IV Piggyback:301.3] Out: 400 [Urine:400] Intake/Output this shift: No intake/output data recorded.  Lab Results: Recent Labs    09/07/18 0046 09/08/18 0349  WBC 9.0 8.0  HGB 14.5 13.3  HCT 42.0 38.2  PLT 277 312   BMET Recent Labs    09/07/18 0046 09/08/18 0349  NA 133* 137  K 3.1* 3.3*  CL 95* 104  CO2 28 26  GLUCOSE 96 102*  BUN 13 13  CREATININE 0.70 0.67  CALCIUM 10.0 9.6   LFT Recent Labs    09/07/18 0046 09/08/18 0349  PROT 6.5 6.0*  ALBUMIN 3.9 3.5  AST 23 17  ALT 17 14  ALKPHOS 37* 33*  BILITOT 1.1 0.9  BILIDIR 0.2  --   IBILI 0.9  --    PT/INR No results for input(s): LABPROT, INR in the last 72 hours. Hepatitis Panel No results for input(s): HEPBSAG, HCVAB, HEPAIGM, HEPBIGM in the last 72 hours.    Studies/Results: Ct Abdomen Pelvis Wo Contrast  Result Date: 09/07/2018 CLINICAL DATA:  Severe  abdominal distension. Status post barium enema September 05, 2018. History of breast cancer, appendectomy, exploratory laparotomy. EXAM: CT ABDOMEN AND PELVIS WITHOUT CONTRAST TECHNIQUE: Multidetector CT imaging of the abdomen and pelvis was performed following the standard protocol without IV contrast. COMPARISON:  Abdominal radiograph September 06, 2018 and CT abdomen and pelvis Apr 13, 2017. CT abdomen and pelvis report dated September 05, 2018 though images are not available for direct comparison. FINDINGS: LOWER CHEST: Pleural thickening without pleural effusion. Moderate coronary artery calcification. Small pericardial effusion. Included heart size is normal. HEPATOBILIARY: Mass effect on liver due to distended bowel, otherwise negative. Dense gallbladder compatible with vicarious excretion with 1 cm gallstone. PANCREAS: Normal. SPLEEN: Normal. ADRENALS/URINARY TRACT: Kidneys are orthotopic, demonstrating normal size and morphology. Mild symmetric cortical enhancement. No nephrolithiasis, hydronephrosis; limited assessment for renal masses by nonenhanced CT. The unopacified ureters are normal in course and caliber. Urinary bladder is well distended with contrast. Thickened adrenal gland seen with hyperplasia. STOMACH/BOWEL: Contrast distended rectum with dense flocculated stool and sigmoid colon which courses on the RIGHT. Streak artifact from contrast at the rectum consistent with prior barium enema. Distended colon to 8 cm with air stool levels and large amount of retained large bowel stool. Multiple loops of redundant colon with multiple transition points. Decompressed small bowel. VASCULAR/LYMPHATIC: Aortoiliac vessels are normal in course and caliber. No lymphadenopathy by CT size criteria. REPRODUCTIVE: Obscured, potentially absent. OTHER: Small volume ascites RIGHT upper quadrant. No intraperitoneal free air. MUSCULOSKELETAL: Non-acute. Osteopenia. Moderate old T12 compression fracture. 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 Redemonstration of 2.3 cm RIGHT gluteal subcutaneous fat mass. IMPRESSION: 1. No pneumoperitoneum.  Retained contrast from recent outside CT. 2. Large amount of retained large bowel stool with colonic distension most compatible with adynamic ileus, less likely obstruction. By report this is stable from 2 days ago. Aberrant course of sigmoid colon without corroborated findings of sigmoid volvulus. 3. Cholelithiasis without CT findings of acute cholecystitis. Electronically Signed   By: Elon Alas M.D.   On: 09/07/2018 02:37   Mr Brain Wo Contrast  Result Date: 09/08/2018 CLINICAL DATA:  Dementia with psychosis. Worsening encephalopathy. Urinary tract infection. EXAM: MRI HEAD WITHOUT CONTRAST TECHNIQUE: Multiplanar, multiecho pulse sequences of the brain and surrounding structures were obtained without intravenous contrast. COMPARISON:  08/25/2018. FINDINGS: Brain: Generalized atrophy. Moderate white matter T2 and FLAIR hyperintensities,  consistent with chronic microvascular ischemic change. No acute stroke, acute hemorrhage, mass lesion, or extra-axial fluid. Hydrocephalus ex vacuo. Vascular: Normal flow voids. Skull and upper cervical spine: Normal marrow signal. Sinuses/Orbits: Negative. Other: Stable MR appearance from 08/25/2018. IMPRESSION: Stable exam. Atrophy and small vessel disease similar to priors. No acute intracranial findings. Electronically Signed   By: Staci Righter M.D.   On: 09/08/2018 12:09   Dg Abd Acute W/chest  Addendum Date: 09/07/2018   ADDENDUM REPORT: 09/07/2018 01:28 ADDENDUM: Acute findings discussed with and reconfirmed by Arna Snipe on 09/07/2018 at 1:25 am. Electronically Signed   By: Elon Alas M.D.   On: 09/07/2018 01:28   Result Date: 09/07/2018 CLINICAL DATA:  Abdominal pain and distension. EXAM: DG ABDOMEN ACUTE W/ 1V CHEST COMPARISON:  None. FINDINGS: Cardiomediastinal  silhouette is normal. Mildly calcified aortic arch. LEFT lung base strandy densities. No pleural effusion or focal consolidation. No pneumothorax. No pneumothorax. Soft tissue planes and included osseous structures are unremarkable. Subdiaphragmatic air density. Gas distended small and large bowel difficult to discretely identify due to overlapping bowel. Dense stool distended rectum with flocculated distended stool in rectum. Soft tissue planes and included osseous structures are non suspicious. IMPRESSION: 1. No acute cardiopulmonary process. 2. Large amount of stool in rectum concerning for fecal impaction resulting in high-grade distal bowel obstruction versus severe ileus. 3. Subdiaphragmatic lucency suspicious for pneumoperitoneum, less likely gas distended bowel. Electronically Signed: By: Elon Alas M.D. On: 09/07/2018 00:46     Tye Savoy, NP-C @  09/08/2018, 12:38 PM    Attending physician's note   I have taken a history, examined the patient and reviewed the chart. I agree with the Advanced Practitioner's note, impression and recommendations. Worsening abdominal distention with large stool burden.  Patient has history of chronic constipation with outlet dysfunction and large rectocele.  Acute to subacute deterioration of mental status  MRI negative for acute stroke Neurology consult to exclude infectious etiology or seizure disorder Continue rectal enema every 6-8 hours.  Continue to monitor serial abdominal exams Daily abdominal x-ray  Damaris Hippo , MD [phone]

## 2018-09-08 NOTE — Progress Notes (Signed)
Called MD with concerns about giving enema bases on how the patient looks.  Also notified Md of latest vital signs with increase in BP, Pulse, and temp.  Will continue to monitor.

## 2018-09-08 NOTE — Progress Notes (Signed)
Pharmacy Antibiotic Note  [name] is a 77 y.o. female admitted on 08/12/2018 with adynamic ileus vs obstruction/ SIRS.  Pharmacy has been consulted for Vancomycin and Zosyn dosing.Patient with noted PCN allergy (itching).  Contacted Dr Nevada Crane to confirm continuation with Zosyn.  Received message back that Dr will closely monitor.  Plan: Zosyn 3.375g IV q8h (4 hour infusion).  Vancomycin 1250mg  IV x 1 followed by 1gm IV q24h (Goal AUC 400-500) Follow SCr daily while on Vancomycin & Zosyn Follow culture results and sensitivities  Height: 5\' 3"  (160 cm) Weight: 133 lb 6.1 oz (60.5 kg) IBW/kg (Calculated) : 52.4  Temp (24hrs), Avg:99 F (37.2 C), Min:97.5 F (36.4 C), Max:101.2 F (38.4 C)  Recent Labs  Lab 09/07/18 0046 09/08/18 0349 09/08/18 1451  WBC 9.0 8.0  --   CREATININE 0.70 0.67  --   LATICACIDVEN 1.1  --  0.9    Estimated Creatinine Clearance: 49.5 mL/min (by C-G formula based on SCr of 0.67 mg/dL).    Allergies  Allergen Reactions  . Peanut-Containing Drug Products Swelling  . Penicillins Itching    Has patient had a PCN reaction causing immediate rash, facial/tongue/throat swelling, SOB or lightheadedness with hypotension: No Has patient had a PCN reaction causing severe rash involving mucus membranes or skin necrosis: No Has patient had a PCN reaction that required hospitalization: No Has patient had a PCN reaction occurring within the last 10 years: No If all of the above answers are "NO", then may proceed with Cephalosporin use.  "outer rectal itching"    Antimicrobials this admission: 9/30 vanc >>   9/30 zosyn >>    Dose adjustments this admission:    Microbiology results: 9/30 BCx: sent 9/30 MRSA PCR: sent  Thank you for allowing pharmacy to be a part of this patient's care.  Everette Rank, PharmD 09/08/2018 6:47 PM

## 2018-09-08 NOTE — Progress Notes (Signed)
Pt was bladder scanned due to no voiding all day.  A total of 469 mls were found.  MD was called, orders given.

## 2018-09-09 ENCOUNTER — Inpatient Hospital Stay (HOSPITAL_COMMUNITY): Payer: Medicare Other

## 2018-09-09 LAB — BLOOD CULTURE ID PANEL (REFLEXED)
Acinetobacter baumannii: NOT DETECTED
CANDIDA GLABRATA: NOT DETECTED
CANDIDA KRUSEI: NOT DETECTED
CANDIDA PARAPSILOSIS: NOT DETECTED
Candida albicans: NOT DETECTED
Candida tropicalis: NOT DETECTED
ENTEROBACTER CLOACAE COMPLEX: NOT DETECTED
ESCHERICHIA COLI: NOT DETECTED
Enterobacteriaceae species: NOT DETECTED
Enterococcus species: NOT DETECTED
Haemophilus influenzae: NOT DETECTED
KLEBSIELLA OXYTOCA: NOT DETECTED
Klebsiella pneumoniae: NOT DETECTED
Listeria monocytogenes: NOT DETECTED
Methicillin resistance: DETECTED — AB
Neisseria meningitidis: NOT DETECTED
PROTEUS SPECIES: NOT DETECTED
Pseudomonas aeruginosa: NOT DETECTED
SERRATIA MARCESCENS: NOT DETECTED
STAPHYLOCOCCUS AUREUS BCID: NOT DETECTED
STAPHYLOCOCCUS SPECIES: DETECTED — AB
STREPTOCOCCUS PNEUMONIAE: NOT DETECTED
Streptococcus agalactiae: NOT DETECTED
Streptococcus pyogenes: NOT DETECTED
Streptococcus species: NOT DETECTED

## 2018-09-09 LAB — GLUCOSE, CAPILLARY: Glucose-Capillary: 363 mg/dL — ABNORMAL HIGH (ref 70–99)

## 2018-09-09 DEATH — deceased

## 2018-09-11 LAB — CULTURE, BLOOD (ROUTINE X 2): SPECIAL REQUESTS: ADEQUATE

## 2018-09-13 LAB — CULTURE, BLOOD (ROUTINE X 2)
Culture: NO GROWTH
Special Requests: ADEQUATE

## 2018-09-25 ENCOUNTER — Other Ambulatory Visit: Payer: Self-pay | Admitting: Internal Medicine

## 2018-10-10 NOTE — Significant Event (Signed)
Rapid Response Event Note  Overview: Time Called: 0403 Arrival Time: 0408 Event Type: Respiratory, Cardiac  Initial Focused Assessment: Patient unresponsive, agonal breathing. Patient having long periods of apnea. HR 38, pulse weak & thready.  Interventions: Informed RN that patient was actively passing, family and provider needed to be notified.  Oxygen applied for comfort.   Plan of Care (if not transferred):  Event Summary:  [name]

## 2018-10-10 NOTE — Progress Notes (Signed)
RN found pt with agonal breathing and no BP or pulse. Pt was a DNR and expired at 0430. Daughter came to hospital shortly after. Daughter comforted by staff and stated "I knew it was coming". Death certificate completed and given to RN.  KJKG, NP [facility]

## 2018-10-10 NOTE — Death Summary Note (Signed)
Death Summary  [name] XFG:[id number] DOB: 19-Dec-1940 DOA: 09-30-18  PCP: Colon Branch, MD  Admit date: 30-Sep-2018 Date of Death: October 03, 2018 Time of Death: 0430 Notification: Colon Branch, MD notified of death of 2018-10-03   History of present illness:  [name] is a 77 y.o. female with a history of diabetes mellitus type 2, hypertension, COPD, breast cancer in remission who was admitted to [facility] on September 05, 2018 with complaint of abdominal distention.  Was brought in from behavioral health. CT scan abdomen and pelvis without contrast showed mild gaseous distention of the majority of the colon with sigmoid displacement of the right upper quadrant. There was large amount of stool within the descending and transverse colon this is favored to represent sequela from adynamic ileus and/or constipation. Initially surgery was consulted and followed by gastroenterology who gave patient smog enema and also place patient on MiraLAX. Planning decompression based on the results. But patient's family wanted patient to be transferred to [facility] did not improve after rectal enema.  Final Diagnoses:   1.   Cardiopulmonary arrest. 2.   Worsening abdominal distention with large stool burden in the setting of chronic constipation with outlet dysfunction and large rectocele. 3.   Acute on subacute deterioration of mental status-MRI negative for acute stroke.   The results of significant diagnostics from this hospitalization (including imaging, microbiology, ancillary and laboratory) are listed below for reference.    Significant Diagnostic Studies: Ct Abdomen Pelvis Wo Contrast  Result Date: 09/07/2018 CLINICAL DATA:  Severe abdominal distension. Status post barium enema September 05, 2018. History of breast cancer, appendectomy, exploratory laparotomy. EXAM: CT ABDOMEN AND PELVIS WITHOUT CONTRAST TECHNIQUE: Multidetector CT  imaging of the abdomen and pelvis was performed following the standard protocol without IV contrast. COMPARISON:  Abdominal radiograph Sep 30, 2018 and CT abdomen and pelvis Apr 13, 2017. CT abdomen and pelvis report dated September 05, 2018 though images are not available for direct comparison. FINDINGS: LOWER CHEST: Pleural thickening without pleural effusion. Moderate coronary artery calcification. Small pericardial effusion. Included heart size is normal. HEPATOBILIARY: Mass effect on liver due to distended bowel, otherwise negative. Dense gallbladder compatible with vicarious excretion with 1 cm gallstone. PANCREAS: Normal. SPLEEN: Normal. ADRENALS/URINARY TRACT: Kidneys are orthotopic, demonstrating normal size and morphology. Mild symmetric cortical enhancement. No nephrolithiasis, hydronephrosis; limited assessment for renal masses by nonenhanced CT. The unopacified ureters are normal in course and caliber. Urinary bladder is well distended with contrast. Thickened adrenal gland seen with hyperplasia. STOMACH/BOWEL: Contrast distended rectum with dense flocculated stool and sigmoid colon which courses on the RIGHT. Streak artifact from contrast at the rectum consistent with prior barium enema. Distended colon to 8 cm with air stool levels and large amount of retained large bowel stool. Multiple loops of redundant colon with multiple transition points. Decompressed small bowel. VASCULAR/LYMPHATIC: Aortoiliac vessels are normal in course and caliber. No lymphadenopathy by CT size criteria. REPRODUCTIVE: Obscured, potentially absent. OTHER: Small volume ascites RIGHT upper quadrant. No intraperitoneal free air. MUSCULOSKELETAL: Non-acute. Osteopenia. Moderate old T12 compression fracture. 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 Redemonstration of 2.3 cm RIGHT gluteal subcutaneous fat mass. IMPRESSION: 1. No pneumoperitoneum.  Retained contrast from recent  outside CT. 2. Large amount of retained large bowel stool with colonic distension most compatible with adynamic ileus, less likely obstruction. By report this is stable from 2 days ago. Aberrant course of sigmoid colon without corroborated findings of  sigmoid volvulus. 3. Cholelithiasis without CT findings of acute cholecystitis. Electronically Signed   By: Elon Alas M.D.   On: 09/07/2018 02:37   Dg Chest 2 View  Result Date: 08/26/2018 CLINICAL DATA:  Confusion, weakness EXAM: CHEST - 2 VIEW COMPARISON:  06/10/2018 FINDINGS: The heart size and mediastinal contours are within normal limits. Both lungs are clear. The visualized skeletal structures are unremarkable. IMPRESSION: No active cardiopulmonary disease. Electronically Signed   By: Kathreen Devoid   On: 08/26/2018 10:20   Ct Head Wo Contrast  Result Date: 08/25/2018 CLINICAL DATA:  Altered level of consciousness, confusion, weakness, difficulty walking worse today. History of bilateral breast cancer post chemotherapy and x-ray therapy. EXAM: CT HEAD WITHOUT CONTRAST TECHNIQUE: Contiguous axial images were obtained from the base of the skull through the vertex without intravenous contrast. COMPARISON:  MRI brain 03/22/2018. CT head 12/30/2017 FINDINGS: Brain: No evidence of acute hemorrhage, hydrocephalus, extra-axial collection or mass lesion/mass effect. Mild diffuse cerebral atrophy. Ventricular dilatation consistent with central atrophy. Low-attenuation changes in the deep white matter consistent with small vessel ischemia. Vague asymmetric low-attenuation in the right anterior periventricular white matter appears more prominent than previous study. This could represent developing subacute infarct. No significant mass effect or sulcal effacement. Vascular: Moderate intracranial arterial calcifications are present. Skull: Calvarium appears intact. Sinuses/Orbits: Paranasal sinuses and mastoid air cells are clear. Other: None. IMPRESSION: Chronic  atrophy and small vessel ischemic changes. Vague asymmetric low-attenuation in the right anterior periventricular white matter may represent developing or subacute infarct. No acute intracranial hemorrhage or mass effect. Electronically Signed   By: Lucienne Capers M.D.   On: 08/25/2018 00:56   Mr Brain Wo Contrast  Result Date: 09/08/2018 CLINICAL DATA:  Dementia with psychosis. Worsening encephalopathy. Urinary tract infection. EXAM: MRI HEAD WITHOUT CONTRAST TECHNIQUE: Multiplanar, multiecho pulse sequences of the brain and surrounding structures were obtained without intravenous contrast. COMPARISON:  08/25/2018. FINDINGS: Brain: Generalized atrophy. Moderate white matter T2 and FLAIR hyperintensities, consistent with chronic microvascular ischemic change. No acute stroke, acute hemorrhage, mass lesion, or extra-axial fluid. Hydrocephalus ex vacuo. Vascular: Normal flow voids. Skull and upper cervical spine: Normal marrow signal. Sinuses/Orbits: Negative. Other: Stable MR appearance from 08/25/2018. IMPRESSION: Stable exam. Atrophy and small vessel disease similar to priors. No acute intracranial findings. Electronically Signed   By: Staci Righter M.D.   On: 09/08/2018 12:09   Mr Brain Wo Contrast  Result Date: 08/25/2018 CLINICAL DATA:  Confusion, possible urinary tract infection. Follow-up potential stroke. History of diabetes, hyperlipidemia, breast cancer. EXAM: MRI HEAD WITHOUT CONTRAST TECHNIQUE: Multiplanar, multiecho pulse sequences of the brain and surrounding structures were obtained without intravenous contrast. COMPARISON:  CT HEAD August 25, 2018 and MRI of the head March 22, 2018 FINDINGS: INTRACRANIAL CONTENTS: No reduced diffusion to suggest acute ischemia. No susceptibility artifact to suggest hemorrhage. The ventricles and sulci are normal for patient's age. Patchy supratentorial white matter FLAIR T2 hyperintensities, faint pontine white matter FLAIR T2 hyperintensities. Faint  basal ganglia and thalami T2 hyperintensities seen with chronic small vessel ischemic changes. No suspicious parenchymal signal, masses, mass effect. No abnormal extra-axial fluid collections. No extra-axial masses. VASCULAR: Normal major intracranial vascular flow voids present at skull base. SKULL AND UPPER CERVICAL SPINE: No abnormal sellar expansion. No suspicious calvarial bone marrow signal. Craniocervical junction maintained. SINUSES/ORBITS: The mastoid air-cells and included paranasal sinuses are well-aerated.The included ocular globes and orbital contents are non-suspicious. OTHER: None. IMPRESSION: 1. No acute intracranial process. 2. Stable moderate chronic small vessel  ischemic changes. Electronically Signed   By: Elon Alas M.D.   On: 08/25/2018 06:45   Dg Abd Acute W/chest  Addendum Date: 09/07/2018   ADDENDUM REPORT: 09/07/2018 01:28 ADDENDUM: Acute findings discussed with and reconfirmed by Arna Snipe on 09/07/2018 at 1:25 am. Electronically Signed   By: Elon Alas M.D.   On: 09/07/2018 01:28   Result Date: 09/07/2018 CLINICAL DATA:  Abdominal pain and distension. EXAM: DG ABDOMEN ACUTE W/ 1V CHEST COMPARISON:  None. FINDINGS: Cardiomediastinal silhouette is normal. Mildly calcified aortic arch. LEFT lung base strandy densities. No pleural effusion or focal consolidation. No pneumothorax. No pneumothorax. Soft tissue planes and included osseous structures are unremarkable. Subdiaphragmatic air density. Gas distended small and large bowel difficult to discretely identify due to overlapping bowel. Dense stool distended rectum with flocculated distended stool in rectum. Soft tissue planes and included osseous structures are non suspicious. IMPRESSION: 1. No acute cardiopulmonary process. 2. Large amount of stool in rectum concerning for fecal impaction resulting in high-grade distal bowel obstruction versus severe ileus. 3. Subdiaphragmatic lucency suspicious for  pneumoperitoneum, less likely gas distended bowel. Electronically Signed: By: Elon Alas M.D. On: 09/07/2018 00:46    Microbiology: Recent Results (from the past 240 hour(s))  Culture, blood (routine x 2)     Status: None (Preliminary result)   Collection Time: 09/08/18  6:16 PM  Result Value Ref Range Status   Specimen Description   Final    BLOOD RIGHT ANTECUBITAL Performed at [facility] [address], [place], [place] [address]    Special Requests   Final    BOTTLES DRAWN AEROBIC ONLY Blood Culture adequate volume Performed at [facility] [address] [place], [place] [address]    Culture   Final    NO GROWTH < 24 HOURS Performed at [facility] [address], [place], [place] [address]    Report Status PENDING  Incomplete  Culture, blood (routine x 2)     Status: None (Preliminary result)   Collection Time: 09/08/18  6:17 PM  Result Value Ref Range Status   Specimen Description   Final    BLOOD LEFT HAND Performed at [facility] [address] [place], [place] [address]    Special Requests   Final    BOTTLES DRAWN AEROBIC ONLY Blood Culture adequate volume Performed at [facility] [address], [place], [place] [address]    Culture  Setup Time   Final    GRAM POSITIVE COCCI IN CLUSTERS AEROBIC BOTTLE ONLY Organism ID to follow PATIENT DISCHARGED OR EXPIRED    Culture   Final    NO GROWTH < 24 HOURS Performed at [facility], [place] [address] [place], [place] [address]    Report Status PENDING  Incomplete  Blood Culture ID Panel (Reflexed)     Status: Abnormal   Collection Time: 09/08/18  6:17 PM  Result Value Ref Range Status   Enterococcus species NOT DETECTED NOT DETECTED Final   Listeria monocytogenes NOT DETECTED NOT DETECTED Final   Staphylococcus species DETECTED (A) NOT DETECTED Final    Comment: Methicillin (oxacillin) resistant coagulase  negative staphylococcus. Possible blood culture contaminant (unless isolated from more than one blood culture draw or clinical case suggests pathogenicity). No antibiotic treatment is indicated for blood  culture contaminants. PATIENT DISCHARGED OR EXPIRED    Staphylococcus aureus NOT DETECTED NOT DETECTED Final   Methicillin resistance DETECTED (A) NOT DETECTED Final  Comment: PATIENT DISCHARGED OR EXPIRED   Streptococcus species NOT DETECTED NOT DETECTED Final   Streptococcus agalactiae NOT DETECTED NOT DETECTED Final   Streptococcus pneumoniae NOT DETECTED NOT DETECTED Final   Streptococcus pyogenes NOT DETECTED NOT DETECTED Final   Acinetobacter baumannii NOT DETECTED NOT DETECTED Final   Enterobacteriaceae species NOT DETECTED NOT DETECTED Final   Enterobacter cloacae complex NOT DETECTED NOT DETECTED Final   Escherichia coli NOT DETECTED NOT DETECTED Final   Klebsiella oxytoca NOT DETECTED NOT DETECTED Final   Klebsiella pneumoniae NOT DETECTED NOT DETECTED Final   Proteus species NOT DETECTED NOT DETECTED Final   Serratia marcescens NOT DETECTED NOT DETECTED Final   Haemophilus influenzae NOT DETECTED NOT DETECTED Final   Neisseria meningitidis NOT DETECTED NOT DETECTED Final   Pseudomonas aeruginosa NOT DETECTED NOT DETECTED Final   Candida albicans NOT DETECTED NOT DETECTED Final   Candida glabrata NOT DETECTED NOT DETECTED Final   Candida krusei NOT DETECTED NOT DETECTED Final   Candida parapsilosis NOT DETECTED NOT DETECTED Final   Candida tropicalis NOT DETECTED NOT DETECTED Final    Comment: Performed at [facility], [place] [address] [place], [place] [address]     Labs: Basic Metabolic Panel: Recent Labs  Lab 09/07/18 0046 09/08/18 0349  NA 133* 137  K 3.1* 3.3*  CL 95* 104  CO2 28 26  GLUCOSE 96 102*  BUN 13 13  CREATININE 0.70 0.67  CALCIUM 10.0 9.6   Liver Function Tests: Recent Labs  Lab 09/07/18 0046 09/08/18 0349  AST 23 17  ALT 17 14    ALKPHOS 37* 33*  BILITOT 1.1 0.9  PROT 6.5 6.0*  ALBUMIN 3.9 3.5   No results for input(s): LIPASE, AMYLASE in the last 168 hours. No results for input(s): AMMONIA in the last 168 hours. CBC: Recent Labs  Lab 09/07/18 0046 09/08/18 0349  WBC 9.0 8.0  NEUTROABS 7.0  --   HGB 14.5 13.3  HCT 42.0 38.2  MCV 88.8 88.8  PLT 277 312   Cardiac Enzymes: Recent Labs  Lab 09/08/18 1525  TROPONINI <0.03   D-Dimer No results for input(s): DDIMER in the last 72 hours. BNP: Invalid input(s): POCBNP CBG: Recent Labs  Lab 09/08/18 1242 09/08/18 1607 09/08/18 1955 09/08/18 2352 2018/09/13 0359  GLUCAP 129* 103* 247* 306* 363*   Anemia work up No results for input(s): VITAMINB12, FOLATE, FERRITIN, TIBC, IRON, RETICCTPCT in the last 72 hours. Urinalysis    Component Value Date/Time   COLORURINE YELLOW 08/25/2018 0020   APPEARANCEUR CLEAR 08/25/2018 0020   LABSPEC 1.008 08/25/2018 0020   PHURINE 5.0 08/25/2018 0020   GLUCOSEU NEGATIVE 08/25/2018 0020   GLUCOSEU NEGATIVE 08/22/2015 1620   HGBUR MODERATE (A) 08/25/2018 0020   BILIRUBINUR NEGATIVE 08/25/2018 0020   BILIRUBINUR Negative 08/22/2015 1502   KETONESUR NEGATIVE 08/25/2018 0020   PROTEINUR NEGATIVE 08/25/2018 0020   UROBILINOGEN 0.2 08/24/2018 1455   NITRITE NEGATIVE 08/25/2018 0020   LEUKOCYTESUR SMALL (A) 08/25/2018 0020   Sepsis Labs Invalid input(s): PROCALCITONIN,  WBC,  LACTICIDVEN     SIGNED:  Kayleen Memos, MD  [facility] 09/13/18, 5:39 PM Pager   If 7PM-7AM, please contact night-coverage [email or web address] Password [email or web address]

## 2018-10-10 NOTE — Progress Notes (Signed)
Patient was found at 0400 to have shallow breaths and faint pulse. Vitals were taken BP 49/32 and HR 85. Rapid response was called. After NP Baltazar Najjar was notified of patient's condition and she came and examined patient. Patient was pronounced dead at 47. Daughter was notified.

## 2018-10-10 DEATH — deceased

## 2018-10-20 ENCOUNTER — Ambulatory Visit: Payer: Medicare Other | Admitting: Internal Medicine

## 2018-11-20 ENCOUNTER — Ambulatory Visit: Payer: Medicare Other | Admitting: Internal Medicine

## 2019-04-10 ENCOUNTER — Ambulatory Visit: Payer: Medicare Other | Admitting: Hematology

## 2019-04-10 ENCOUNTER — Other Ambulatory Visit: Payer: Medicare Other
